# Patient Record
Sex: Female | Born: 1965 | Race: Black or African American | Hispanic: No | Smoking: Current some day smoker
Health system: Southern US, Community
[De-identification: ages and names within clinical notes are randomized; demographics above are authoritative.]

## PROBLEM LIST (undated history)

## (undated) DIAGNOSIS — I10 Essential (primary) hypertension: Secondary | ICD-10-CM

## (undated) DIAGNOSIS — Z9289 Personal history of other medical treatment: Secondary | ICD-10-CM

## (undated) DIAGNOSIS — D649 Anemia, unspecified: Secondary | ICD-10-CM

## (undated) DIAGNOSIS — K219 Gastro-esophageal reflux disease without esophagitis: Secondary | ICD-10-CM

## (undated) DIAGNOSIS — E785 Hyperlipidemia, unspecified: Secondary | ICD-10-CM

## (undated) DIAGNOSIS — E079 Disorder of thyroid, unspecified: Secondary | ICD-10-CM

## (undated) DIAGNOSIS — T7840XA Allergy, unspecified, initial encounter: Secondary | ICD-10-CM

## (undated) DIAGNOSIS — C50919 Malignant neoplasm of unspecified site of unspecified female breast: Secondary | ICD-10-CM

## (undated) HISTORY — PX: TUBAL LIGATION: SHX77

## (undated) HISTORY — PX: WISDOM TOOTH EXTRACTION: SHX21

## (undated) HISTORY — DX: Disorder of thyroid, unspecified: E07.9

## (undated) HISTORY — DX: Gastro-esophageal reflux disease without esophagitis: K21.9

## (undated) HISTORY — DX: Malignant neoplasm of unspecified site of unspecified female breast: C50.919

## (undated) HISTORY — PX: OTHER SURGICAL HISTORY: SHX169

## (undated) HISTORY — DX: Allergy, unspecified, initial encounter: T78.40XA

## (undated) HISTORY — DX: Hyperlipidemia, unspecified: E78.5

## (undated) HISTORY — DX: Essential (primary) hypertension: I10

## (undated) HISTORY — DX: Anemia, unspecified: D64.9

---

## 1998-06-21 ENCOUNTER — Emergency Department (HOSPITAL_COMMUNITY): Admission: EM | Admit: 1998-06-21 | Discharge: 1998-06-21 | Payer: Self-pay | Admitting: Emergency Medicine

## 2001-03-31 ENCOUNTER — Other Ambulatory Visit: Admission: RE | Admit: 2001-03-31 | Discharge: 2001-03-31 | Payer: Self-pay | Admitting: Family Medicine

## 2004-02-28 ENCOUNTER — Emergency Department (HOSPITAL_COMMUNITY): Admission: EM | Admit: 2004-02-28 | Discharge: 2004-02-28 | Payer: Self-pay | Admitting: Emergency Medicine

## 2004-04-15 DIAGNOSIS — I10 Essential (primary) hypertension: Secondary | ICD-10-CM | POA: Insufficient documentation

## 2004-08-07 ENCOUNTER — Ambulatory Visit: Payer: Self-pay | Admitting: Internal Medicine

## 2004-08-07 ENCOUNTER — Ambulatory Visit: Payer: Self-pay | Admitting: *Deleted

## 2004-08-28 ENCOUNTER — Ambulatory Visit: Payer: Self-pay | Admitting: Internal Medicine

## 2005-01-16 ENCOUNTER — Ambulatory Visit: Payer: Self-pay | Admitting: Family Medicine

## 2005-02-19 ENCOUNTER — Ambulatory Visit: Payer: Self-pay | Admitting: Family Medicine

## 2005-02-19 DIAGNOSIS — E785 Hyperlipidemia, unspecified: Secondary | ICD-10-CM

## 2005-04-03 ENCOUNTER — Ambulatory Visit: Payer: Self-pay | Admitting: Family Medicine

## 2005-05-01 ENCOUNTER — Ambulatory Visit: Payer: Self-pay | Admitting: Family Medicine

## 2005-06-27 ENCOUNTER — Ambulatory Visit: Payer: Self-pay | Admitting: Family Medicine

## 2005-06-27 DIAGNOSIS — D509 Iron deficiency anemia, unspecified: Secondary | ICD-10-CM | POA: Insufficient documentation

## 2005-09-04 ENCOUNTER — Ambulatory Visit: Payer: Self-pay | Admitting: Family Medicine

## 2005-09-22 ENCOUNTER — Ambulatory Visit (HOSPITAL_COMMUNITY): Admission: RE | Admit: 2005-09-22 | Discharge: 2005-09-22 | Payer: Self-pay | Admitting: Family Medicine

## 2005-12-19 ENCOUNTER — Ambulatory Visit: Payer: Self-pay | Admitting: Internal Medicine

## 2006-02-16 ENCOUNTER — Ambulatory Visit: Payer: Self-pay | Admitting: Family Medicine

## 2006-04-21 ENCOUNTER — Ambulatory Visit: Payer: Self-pay | Admitting: Family Medicine

## 2006-04-23 ENCOUNTER — Ambulatory Visit: Payer: Self-pay | Admitting: Family Medicine

## 2006-06-17 ENCOUNTER — Ambulatory Visit: Payer: Self-pay | Admitting: Family Medicine

## 2006-07-13 ENCOUNTER — Ambulatory Visit: Payer: Self-pay | Admitting: Family Medicine

## 2006-12-01 ENCOUNTER — Encounter (INDEPENDENT_AMBULATORY_CARE_PROVIDER_SITE_OTHER): Payer: Self-pay | Admitting: Specialist

## 2006-12-01 ENCOUNTER — Ambulatory Visit: Payer: Self-pay | Admitting: Family Medicine

## 2006-12-28 ENCOUNTER — Emergency Department (HOSPITAL_COMMUNITY): Admission: EM | Admit: 2006-12-28 | Discharge: 2006-12-29 | Payer: Self-pay | Admitting: Emergency Medicine

## 2006-12-29 ENCOUNTER — Ambulatory Visit: Payer: Self-pay | Admitting: Family Medicine

## 2007-01-26 ENCOUNTER — Ambulatory Visit: Payer: Self-pay | Admitting: Family Medicine

## 2007-06-14 ENCOUNTER — Ambulatory Visit: Payer: Self-pay | Admitting: Family Medicine

## 2007-06-17 DIAGNOSIS — J309 Allergic rhinitis, unspecified: Secondary | ICD-10-CM | POA: Insufficient documentation

## 2007-07-21 ENCOUNTER — Encounter (INDEPENDENT_AMBULATORY_CARE_PROVIDER_SITE_OTHER): Payer: Self-pay | Admitting: *Deleted

## 2007-08-16 ENCOUNTER — Encounter (INDEPENDENT_AMBULATORY_CARE_PROVIDER_SITE_OTHER): Payer: Self-pay | Admitting: Family Medicine

## 2007-08-19 ENCOUNTER — Telehealth (INDEPENDENT_AMBULATORY_CARE_PROVIDER_SITE_OTHER): Payer: Self-pay | Admitting: Internal Medicine

## 2007-11-04 DIAGNOSIS — Z9289 Personal history of other medical treatment: Secondary | ICD-10-CM

## 2007-11-04 HISTORY — DX: Personal history of other medical treatment: Z92.89

## 2007-12-02 ENCOUNTER — Ambulatory Visit: Payer: Self-pay | Admitting: Nurse Practitioner

## 2007-12-02 DIAGNOSIS — K5900 Constipation, unspecified: Secondary | ICD-10-CM | POA: Insufficient documentation

## 2007-12-02 DIAGNOSIS — S46819A Strain of other muscles, fascia and tendons at shoulder and upper arm level, unspecified arm, initial encounter: Secondary | ICD-10-CM

## 2008-01-05 ENCOUNTER — Ambulatory Visit: Payer: Self-pay | Admitting: Nurse Practitioner

## 2008-01-21 ENCOUNTER — Ambulatory Visit: Payer: Self-pay | Admitting: Nurse Practitioner

## 2008-01-21 LAB — CONVERTED CEMR LAB
Ketones, urine, test strip: NEGATIVE
Nitrite: NEGATIVE
Specific Gravity, Urine: 1.015
WBC Urine, dipstick: NEGATIVE

## 2008-01-25 LAB — CONVERTED CEMR LAB
ALT: 15 units/L (ref 0–35)
AST: 20 units/L (ref 0–37)
BUN: 11 mg/dL (ref 6–23)
Basophils Absolute: 0 10*3/uL (ref 0.0–0.1)
Basophils Relative: 0 % (ref 0–1)
CO2: 19 meq/L (ref 19–32)
Calcium: 10 mg/dL (ref 8.4–10.5)
Cholesterol: 184 mg/dL (ref 0–200)
Creatinine, Ser: 0.51 mg/dL (ref 0.40–1.20)
Eosinophils Relative: 4 % (ref 0–5)
HCT: 32.7 % — ABNORMAL LOW (ref 36.0–46.0)
HDL: 45 mg/dL (ref >39)
Hemoglobin: 9.3 g/dL — ABNORMAL LOW (ref 12.0–15.0)
MCHC: 28.4 g/dL — ABNORMAL LOW (ref 30.0–36.0)
Monocytes Absolute: 0.5 10*3/uL (ref 0.1–1.0)
Monocytes Relative: 11 % (ref 3–12)
RBC: 4.59 M/uL (ref 3.87–5.11)
RDW: 20.5 % — ABNORMAL HIGH (ref 11.5–15.5)
Total Bilirubin: 0.6 mg/dL (ref 0.3–1.2)
Total CHOL/HDL Ratio: 4.1
VLDL: 23 mg/dL (ref 0–40)

## 2008-01-28 ENCOUNTER — Encounter (INDEPENDENT_AMBULATORY_CARE_PROVIDER_SITE_OTHER): Payer: Self-pay | Admitting: Nurse Practitioner

## 2008-02-14 ENCOUNTER — Ambulatory Visit (HOSPITAL_COMMUNITY): Admission: RE | Admit: 2008-02-14 | Discharge: 2008-02-14 | Payer: Self-pay | Admitting: Internal Medicine

## 2008-02-18 ENCOUNTER — Telehealth (INDEPENDENT_AMBULATORY_CARE_PROVIDER_SITE_OTHER): Payer: Self-pay | Admitting: Nurse Practitioner

## 2008-02-18 ENCOUNTER — Encounter: Admission: RE | Admit: 2008-02-18 | Discharge: 2008-02-18 | Payer: Self-pay | Admitting: Internal Medicine

## 2008-04-10 ENCOUNTER — Ambulatory Visit: Payer: Self-pay | Admitting: Nurse Practitioner

## 2008-04-10 DIAGNOSIS — L84 Corns and callosities: Secondary | ICD-10-CM | POA: Insufficient documentation

## 2008-04-10 LAB — CONVERTED CEMR LAB
Bilirubin Urine: NEGATIVE
Glucose, Urine, Semiquant: NEGATIVE
Protein, U semiquant: 30
Urobilinogen, UA: 1
pH: 7

## 2008-04-11 ENCOUNTER — Encounter (INDEPENDENT_AMBULATORY_CARE_PROVIDER_SITE_OTHER): Payer: Self-pay | Admitting: Nurse Practitioner

## 2008-07-26 ENCOUNTER — Ambulatory Visit: Payer: Self-pay | Admitting: Nurse Practitioner

## 2008-07-26 DIAGNOSIS — J45909 Unspecified asthma, uncomplicated: Secondary | ICD-10-CM | POA: Insufficient documentation

## 2008-07-26 DIAGNOSIS — F17200 Nicotine dependence, unspecified, uncomplicated: Secondary | ICD-10-CM

## 2008-07-26 LAB — CONVERTED CEMR LAB
Bilirubin Urine: NEGATIVE
Glucose, Urine, Semiquant: NEGATIVE
KOH Prep: NEGATIVE
Ketones, urine, test strip: NEGATIVE
Nitrite: NEGATIVE
Protein, U semiquant: NEGATIVE
Specific Gravity, Urine: 1.025
Urobilinogen, UA: 1
WBC Urine, dipstick: NEGATIVE
pH: 6.5

## 2008-07-27 ENCOUNTER — Encounter (INDEPENDENT_AMBULATORY_CARE_PROVIDER_SITE_OTHER): Payer: Self-pay | Admitting: Nurse Practitioner

## 2008-08-08 ENCOUNTER — Telehealth (INDEPENDENT_AMBULATORY_CARE_PROVIDER_SITE_OTHER): Payer: Self-pay | Admitting: Nurse Practitioner

## 2008-08-21 ENCOUNTER — Emergency Department (HOSPITAL_COMMUNITY): Admission: EM | Admit: 2008-08-21 | Discharge: 2008-08-22 | Payer: Self-pay | Admitting: Emergency Medicine

## 2008-12-18 ENCOUNTER — Ambulatory Visit: Payer: Self-pay | Admitting: Nurse Practitioner

## 2008-12-18 DIAGNOSIS — K219 Gastro-esophageal reflux disease without esophagitis: Secondary | ICD-10-CM | POA: Insufficient documentation

## 2009-02-19 ENCOUNTER — Ambulatory Visit: Payer: Self-pay | Admitting: Internal Medicine

## 2009-04-13 ENCOUNTER — Encounter (INDEPENDENT_AMBULATORY_CARE_PROVIDER_SITE_OTHER): Payer: Self-pay | Admitting: Nurse Practitioner

## 2009-04-13 ENCOUNTER — Telehealth (INDEPENDENT_AMBULATORY_CARE_PROVIDER_SITE_OTHER): Payer: Self-pay | Admitting: Nurse Practitioner

## 2009-04-18 ENCOUNTER — Telehealth (INDEPENDENT_AMBULATORY_CARE_PROVIDER_SITE_OTHER): Payer: Self-pay | Admitting: *Deleted

## 2009-04-19 ENCOUNTER — Emergency Department (HOSPITAL_COMMUNITY): Admission: EM | Admit: 2009-04-19 | Discharge: 2009-04-19 | Payer: Self-pay | Admitting: Emergency Medicine

## 2009-04-23 ENCOUNTER — Ambulatory Visit: Payer: Self-pay | Admitting: Nurse Practitioner

## 2009-04-23 DIAGNOSIS — M25469 Effusion, unspecified knee: Secondary | ICD-10-CM | POA: Insufficient documentation

## 2009-04-24 ENCOUNTER — Encounter (INDEPENDENT_AMBULATORY_CARE_PROVIDER_SITE_OTHER): Payer: Self-pay | Admitting: Nurse Practitioner

## 2009-04-24 LAB — CONVERTED CEMR LAB
ALT: 25 units/L (ref 0–35)
Albumin: 4.3 g/dL (ref 3.5–5.2)
Alkaline Phosphatase: 137 units/L — ABNORMAL HIGH (ref 39–117)
Basophils Absolute: 0 10*3/uL (ref 0.0–0.1)
CO2: 21 meq/L (ref 19–32)
Eosinophils Relative: 4 % (ref 0–5)
HCT: 24.9 % — ABNORMAL LOW (ref 36.0–46.0)
Hemoglobin: 6.8 g/dL — CL (ref 12.0–15.0)
Lymphocytes Relative: 30 % (ref 12–46)
Lymphs Abs: 1.9 10*3/uL (ref 0.7–4.0)
Monocytes Absolute: 0.5 10*3/uL (ref 0.1–1.0)
Monocytes Relative: 8 % (ref 3–12)
Neutro Abs: 3.7 10*3/uL (ref 1.7–7.7)
Potassium: 4.3 meq/L (ref 3.5–5.3)
RBC: 4.23 M/uL (ref 3.87–5.11)
RDW: 20.6 % — ABNORMAL HIGH (ref 11.5–15.5)
Sed Rate: 36 mm/hr — ABNORMAL HIGH (ref 0–22)
Sodium: 137 meq/L (ref 135–145)
Total Bilirubin: 0.3 mg/dL (ref 0.3–1.2)
Total Protein: 7.6 g/dL (ref 6.0–8.3)
Uric Acid, Serum: 4 mg/dL (ref 2.4–7.0)
WBC: 6.3 10*3/uL (ref 4.0–10.5)

## 2009-04-25 ENCOUNTER — Encounter (INDEPENDENT_AMBULATORY_CARE_PROVIDER_SITE_OTHER): Payer: Self-pay | Admitting: Nurse Practitioner

## 2009-04-25 LAB — CONVERTED CEMR LAB
HCT: 26 % — ABNORMAL LOW (ref 36.0–46.0)
Hemoglobin: 7 g/dL — CL (ref 12.0–15.0)
LDL Cholesterol: 94 mg/dL (ref 0–99)
MCHC: 26.7 g/dL — ABNORMAL LOW (ref 30.0–36.0)
Platelets: 434 10*3/uL — ABNORMAL HIGH (ref 150–400)
RDW: 20.7 % — ABNORMAL HIGH (ref 11.5–15.5)
VLDL: 27 mg/dL (ref 0–40)

## 2009-05-23 ENCOUNTER — Ambulatory Visit: Payer: Self-pay | Admitting: Family Medicine

## 2009-05-25 ENCOUNTER — Encounter: Admission: RE | Admit: 2009-05-25 | Discharge: 2009-05-25 | Payer: Self-pay | Admitting: Internal Medicine

## 2009-05-28 ENCOUNTER — Telehealth (INDEPENDENT_AMBULATORY_CARE_PROVIDER_SITE_OTHER): Payer: Self-pay | Admitting: Nurse Practitioner

## 2009-06-04 ENCOUNTER — Encounter (INDEPENDENT_AMBULATORY_CARE_PROVIDER_SITE_OTHER): Payer: Self-pay | Admitting: Diagnostic Radiology

## 2009-06-04 ENCOUNTER — Encounter: Admission: RE | Admit: 2009-06-04 | Discharge: 2009-06-04 | Payer: Self-pay | Admitting: Internal Medicine

## 2009-06-08 ENCOUNTER — Ambulatory Visit: Payer: Self-pay | Admitting: Oncology

## 2009-06-11 ENCOUNTER — Encounter: Admission: RE | Admit: 2009-06-11 | Discharge: 2009-06-11 | Payer: Self-pay | Admitting: Internal Medicine

## 2009-06-13 LAB — CBC WITH DIFFERENTIAL/PLATELET
Basophils Absolute: 0.1 10*3/uL (ref 0.0–0.1)
Eosinophils Absolute: 0.3 10*3/uL (ref 0.0–0.5)
HGB: 7.9 g/dL — ABNORMAL LOW (ref 11.6–15.9)
LYMPH%: 37.4 % (ref 14.0–49.7)
MCV: 67.6 fL — ABNORMAL LOW (ref 79.5–101.0)
MONO#: 0.7 10*3/uL (ref 0.1–0.9)
MONO%: 13.4 % (ref 0.0–14.0)
NEUT#: 2.2 10*3/uL (ref 1.5–6.5)
Platelets: 406 10*3/uL — ABNORMAL HIGH (ref 145–400)
RBC: 3.98 10*6/uL (ref 3.70–5.45)
RDW: 23.8 % — ABNORMAL HIGH (ref 11.2–14.5)
WBC: 5.2 10*3/uL (ref 3.9–10.3)

## 2009-06-13 LAB — FERRITIN: Ferritin: 2 ng/mL — ABNORMAL LOW (ref 10–291)

## 2009-06-14 LAB — COMPREHENSIVE METABOLIC PANEL
ALT: 13 U/L (ref 0–35)
CO2: 20 mEq/L (ref 19–32)
Calcium: 10.5 mg/dL (ref 8.4–10.5)
Chloride: 106 mEq/L (ref 96–112)
Potassium: 4.1 mEq/L (ref 3.5–5.3)
Sodium: 135 mEq/L (ref 135–145)
Total Protein: 7.3 g/dL (ref 6.0–8.3)

## 2009-06-14 LAB — LACTATE DEHYDROGENASE: LDH: 182 U/L (ref 94–250)

## 2009-06-20 ENCOUNTER — Ambulatory Visit (HOSPITAL_BASED_OUTPATIENT_CLINIC_OR_DEPARTMENT_OTHER): Admission: RE | Admit: 2009-06-20 | Discharge: 2009-06-20 | Payer: Self-pay | Admitting: General Surgery

## 2009-06-22 ENCOUNTER — Encounter (INDEPENDENT_AMBULATORY_CARE_PROVIDER_SITE_OTHER): Payer: Self-pay | Admitting: Nurse Practitioner

## 2009-06-26 ENCOUNTER — Encounter: Payer: Self-pay | Admitting: Oncology

## 2009-06-26 ENCOUNTER — Ambulatory Visit: Admission: RE | Admit: 2009-06-26 | Discharge: 2009-06-26 | Payer: Self-pay | Admitting: Oncology

## 2009-06-27 ENCOUNTER — Ambulatory Visit (HOSPITAL_COMMUNITY): Admission: RE | Admit: 2009-06-27 | Discharge: 2009-06-27 | Payer: Self-pay | Admitting: Oncology

## 2009-06-29 ENCOUNTER — Encounter (INDEPENDENT_AMBULATORY_CARE_PROVIDER_SITE_OTHER): Payer: Self-pay | Admitting: Nurse Practitioner

## 2009-06-29 LAB — RETICULOCYTES: RBC: 4.01 10*6/uL (ref 3.70–5.45)

## 2009-06-29 LAB — CBC WITH DIFFERENTIAL/PLATELET
BASO%: 0.2 % (ref 0.0–2.0)
LYMPH%: 11 % — ABNORMAL LOW (ref 14.0–49.7)
MCHC: 29.8 g/dL — ABNORMAL LOW (ref 31.5–36.0)
MONO#: 1.2 10*3/uL — ABNORMAL HIGH (ref 0.1–0.9)
Platelets: 235 10*3/uL (ref 145–400)
RBC: 4.05 10*6/uL (ref 3.70–5.45)
RDW: 29 % — ABNORMAL HIGH (ref 11.2–14.5)
WBC: 12.4 10*3/uL — ABNORMAL HIGH (ref 3.9–10.3)
nRBC: 1 % — ABNORMAL HIGH (ref 0–0)

## 2009-06-29 LAB — FERRITIN: Ferritin: 730 ng/mL — ABNORMAL HIGH (ref 10–291)

## 2009-07-03 ENCOUNTER — Ambulatory Visit: Payer: Self-pay | Admitting: Oncology

## 2009-07-03 LAB — CBC WITH DIFFERENTIAL/PLATELET
Eosinophils Absolute: 0.1 10*3/uL (ref 0.0–0.5)
HCT: 28.7 % — ABNORMAL LOW (ref 34.8–46.6)
LYMPH%: 11.5 % — ABNORMAL LOW (ref 14.0–49.7)
MCHC: 30.3 g/dL — ABNORMAL LOW (ref 31.5–36.0)
MCV: 73.4 fL — ABNORMAL LOW (ref 79.5–101.0)
MONO#: 0.2 10*3/uL (ref 0.1–0.9)
NEUT#: 9.1 10*3/uL — ABNORMAL HIGH (ref 1.5–6.5)
NEUT%: 84.4 % — ABNORMAL HIGH (ref 38.4–76.8)
Platelets: 236 10*3/uL (ref 145–400)
WBC: 10.8 10*3/uL — ABNORMAL HIGH (ref 3.9–10.3)

## 2009-07-06 ENCOUNTER — Encounter (INDEPENDENT_AMBULATORY_CARE_PROVIDER_SITE_OTHER): Payer: Self-pay | Admitting: Nurse Practitioner

## 2009-07-06 DIAGNOSIS — C50919 Malignant neoplasm of unspecified site of unspecified female breast: Secondary | ICD-10-CM | POA: Insufficient documentation

## 2009-07-06 LAB — CBC WITH DIFFERENTIAL/PLATELET
BASO%: 1.1 % (ref 0.0–2.0)
Basophils Absolute: 0.1 10*3/uL (ref 0.0–0.1)
EOS%: 0.4 % (ref 0.0–7.0)
HCT: 31.2 % — ABNORMAL LOW (ref 34.8–46.6)
HGB: 9.5 g/dL — ABNORMAL LOW (ref 11.6–15.9)
LYMPH%: 24.2 % (ref 14.0–49.7)
MCH: 22.5 pg — ABNORMAL LOW (ref 25.1–34.0)
MCHC: 30.4 g/dL — ABNORMAL LOW (ref 31.5–36.0)
MONO#: 1.7 10*3/uL — ABNORMAL HIGH (ref 0.1–0.9)
NEUT%: 56.1 % (ref 38.4–76.8)
Platelets: 435 10*3/uL — ABNORMAL HIGH (ref 145–400)

## 2009-07-13 ENCOUNTER — Encounter: Payer: Self-pay | Admitting: Endocrinology

## 2009-07-13 ENCOUNTER — Encounter (INDEPENDENT_AMBULATORY_CARE_PROVIDER_SITE_OTHER): Payer: Self-pay | Admitting: Nurse Practitioner

## 2009-07-13 LAB — CBC WITH DIFFERENTIAL/PLATELET
Basophils Absolute: 0.1 10*3/uL (ref 0.0–0.1)
EOS%: 0.4 % (ref 0.0–7.0)
Eosinophils Absolute: 0 10*3/uL (ref 0.0–0.5)
HGB: 9.3 g/dL — ABNORMAL LOW (ref 11.6–15.9)
LYMPH%: 23.8 % (ref 14.0–49.7)
MCH: 23.9 pg — ABNORMAL LOW (ref 25.1–34.0)
MCV: 78.4 fL — ABNORMAL LOW (ref 79.5–101.0)
MONO%: 11.4 % (ref 0.0–14.0)
NEUT%: 63 % (ref 38.4–76.8)
Platelets: 221 10*3/uL (ref 145–400)
RDW: 31.2 % — ABNORMAL HIGH (ref 11.2–14.5)

## 2009-07-20 ENCOUNTER — Telehealth (INDEPENDENT_AMBULATORY_CARE_PROVIDER_SITE_OTHER): Payer: Self-pay | Admitting: Nurse Practitioner

## 2009-07-20 ENCOUNTER — Encounter (INDEPENDENT_AMBULATORY_CARE_PROVIDER_SITE_OTHER): Payer: Self-pay | Admitting: Nurse Practitioner

## 2009-07-20 LAB — CBC WITH DIFFERENTIAL/PLATELET
BASO%: 0.6 % (ref 0.0–2.0)
EOS%: 0.3 % (ref 0.0–7.0)
HGB: 10.3 g/dL — ABNORMAL LOW (ref 11.6–15.9)
MCH: 25.4 pg (ref 25.1–34.0)
MCHC: 31.7 g/dL (ref 31.5–36.0)
MCV: 80.2 fL (ref 79.5–101.0)
MONO#: 1.1 10*3/uL — ABNORMAL HIGH (ref 0.1–0.9)
NEUT%: 74.2 % (ref 38.4–76.8)
RBC: 4.05 10*6/uL (ref 3.70–5.45)
lymph#: 1.1 10*3/uL (ref 0.9–3.3)
nRBC: 1 % — ABNORMAL HIGH (ref 0–0)

## 2009-07-20 LAB — COMPREHENSIVE METABOLIC PANEL
AST: 28 U/L (ref 0–37)
Albumin: 4.2 g/dL (ref 3.5–5.2)
BUN: 12 mg/dL (ref 6–23)
Calcium: 10.2 mg/dL (ref 8.4–10.5)
Chloride: 105 mEq/L (ref 96–112)
Potassium: 3.4 mEq/L — ABNORMAL LOW (ref 3.5–5.3)
Sodium: 137 mEq/L (ref 135–145)
Total Protein: 7.4 g/dL (ref 6.0–8.3)

## 2009-07-23 ENCOUNTER — Encounter: Payer: Self-pay | Admitting: Endocrinology

## 2009-07-27 ENCOUNTER — Encounter (INDEPENDENT_AMBULATORY_CARE_PROVIDER_SITE_OTHER): Payer: Self-pay | Admitting: Nurse Practitioner

## 2009-07-27 LAB — CBC WITH DIFFERENTIAL/PLATELET
Basophils Absolute: 0 10*3/uL (ref 0.0–0.1)
Eosinophils Absolute: 0 10*3/uL (ref 0.0–0.5)
HGB: 10.6 g/dL — ABNORMAL LOW (ref 11.6–15.9)
MONO#: 0.1 10*3/uL (ref 0.1–0.9)
MONO%: 1.9 % (ref 0.0–14.0)
NEUT#: 4.6 10*3/uL (ref 1.5–6.5)
RBC: 3.96 10*6/uL (ref 3.70–5.45)
RDW: 34.8 % — ABNORMAL HIGH (ref 11.2–14.5)
WBC: 5.9 10*3/uL (ref 3.9–10.3)
lymph#: 1.1 10*3/uL (ref 0.9–3.3)

## 2009-08-01 ENCOUNTER — Ambulatory Visit: Payer: Self-pay | Admitting: Oncology

## 2009-08-02 ENCOUNTER — Ambulatory Visit: Payer: Self-pay | Admitting: Endocrinology

## 2009-08-02 DIAGNOSIS — E049 Nontoxic goiter, unspecified: Secondary | ICD-10-CM

## 2009-08-02 DIAGNOSIS — E0590 Thyrotoxicosis, unspecified without thyrotoxic crisis or storm: Secondary | ICD-10-CM | POA: Insufficient documentation

## 2009-08-03 ENCOUNTER — Telehealth (INDEPENDENT_AMBULATORY_CARE_PROVIDER_SITE_OTHER): Payer: Self-pay | Admitting: Nurse Practitioner

## 2009-08-03 ENCOUNTER — Encounter (INDEPENDENT_AMBULATORY_CARE_PROVIDER_SITE_OTHER): Payer: Self-pay | Admitting: Nurse Practitioner

## 2009-08-03 LAB — CBC WITH DIFFERENTIAL/PLATELET
BASO%: 1.4 % (ref 0.0–2.0)
EOS%: 0.6 % (ref 0.0–7.0)
HCT: 31.6 % — ABNORMAL LOW (ref 34.8–46.6)
HGB: 9.8 g/dL — ABNORMAL LOW (ref 11.6–15.9)
LYMPH%: 22.2 % (ref 14.0–49.7)
MCH: 26.3 pg (ref 25.1–34.0)
MCV: 84.9 fL (ref 79.5–101.0)
MONO%: 11.6 % (ref 0.0–14.0)
NEUT%: 64.2 % (ref 38.4–76.8)
Platelets: 151 10*3/uL (ref 145–400)

## 2009-08-10 ENCOUNTER — Encounter (HOSPITAL_COMMUNITY): Admission: RE | Admit: 2009-08-10 | Discharge: 2009-11-02 | Payer: Self-pay | Admitting: Oncology

## 2009-08-10 LAB — CBC WITH DIFFERENTIAL/PLATELET
Basophils Absolute: 0 10*3/uL (ref 0.0–0.1)
EOS%: 0 % (ref 0.0–7.0)
HGB: 8.4 g/dL — ABNORMAL LOW (ref 11.6–15.9)
MCH: 26.7 pg (ref 25.1–34.0)
MCHC: 31.3 g/dL — ABNORMAL LOW (ref 31.5–36.0)
MCV: 85.1 fL (ref 79.5–101.0)
MONO%: 6 % (ref 0.0–14.0)
RDW: 28.8 % — ABNORMAL HIGH (ref 11.2–14.5)

## 2009-08-10 LAB — COMPREHENSIVE METABOLIC PANEL
AST: 27 U/L (ref 0–37)
Albumin: 4.1 g/dL (ref 3.5–5.2)
Alkaline Phosphatase: 90 U/L (ref 39–117)
BUN: 10 mg/dL (ref 6–23)
Creatinine, Ser: 0.48 mg/dL (ref 0.40–1.20)
Potassium: 3.7 mEq/L (ref 3.5–5.3)

## 2009-08-10 LAB — TYPE & CROSSMATCH - CHCC

## 2009-08-13 ENCOUNTER — Ambulatory Visit (HOSPITAL_COMMUNITY): Admission: RE | Admit: 2009-08-13 | Discharge: 2009-08-13 | Payer: Self-pay | Admitting: Oncology

## 2009-08-16 LAB — CBC WITH DIFFERENTIAL/PLATELET
EOS%: 0.8 % (ref 0.0–7.0)
Eosinophils Absolute: 0 10*3/uL (ref 0.0–0.5)
HCT: 31.6 % — ABNORMAL LOW (ref 34.8–46.6)
HGB: 10.6 g/dL — ABNORMAL LOW (ref 11.6–15.9)
MCH: 29.1 pg (ref 25.1–34.0)
MCHC: 33.6 g/dL (ref 31.5–36.0)
MONO#: 0.1 10*3/uL (ref 0.1–0.9)
MONO%: 2.2 % (ref 0.0–14.0)
Platelets: 302 10*3/uL (ref 145–400)
RDW: 27.5 % — ABNORMAL HIGH (ref 11.2–14.5)
WBC: 6 10*3/uL (ref 3.9–10.3)

## 2009-08-20 ENCOUNTER — Ambulatory Visit (HOSPITAL_COMMUNITY): Admission: RE | Admit: 2009-08-20 | Discharge: 2009-08-20 | Payer: Self-pay | Admitting: Oncology

## 2009-08-20 ENCOUNTER — Encounter (INDEPENDENT_AMBULATORY_CARE_PROVIDER_SITE_OTHER): Payer: Self-pay | Admitting: Nurse Practitioner

## 2009-08-21 ENCOUNTER — Encounter (HOSPITAL_COMMUNITY): Admission: RE | Admit: 2009-08-21 | Discharge: 2009-11-02 | Payer: Self-pay | Admitting: Endocrinology

## 2009-08-22 ENCOUNTER — Encounter (INDEPENDENT_AMBULATORY_CARE_PROVIDER_SITE_OTHER): Payer: Self-pay | Admitting: Nurse Practitioner

## 2009-08-22 LAB — CBC WITH DIFFERENTIAL/PLATELET
Eosinophils Absolute: 0 10*3/uL (ref 0.0–0.5)
MONO#: 0.4 10*3/uL (ref 0.1–0.9)
NEUT#: 7.9 10*3/uL — ABNORMAL HIGH (ref 1.5–6.5)
Platelets: 241 10*3/uL (ref 145–400)
RBC: 3.86 10*6/uL (ref 3.70–5.45)
RDW: 27.3 % — ABNORMAL HIGH (ref 11.2–14.5)
WBC: 9.4 10*3/uL (ref 3.9–10.3)
lymph#: 1 10*3/uL (ref 0.9–3.3)

## 2009-08-31 ENCOUNTER — Encounter (INDEPENDENT_AMBULATORY_CARE_PROVIDER_SITE_OTHER): Payer: Self-pay | Admitting: Nurse Practitioner

## 2009-08-31 ENCOUNTER — Ambulatory Visit: Payer: Self-pay | Admitting: Oncology

## 2009-08-31 LAB — CBC WITH DIFFERENTIAL/PLATELET
Eosinophils Absolute: 0 10*3/uL (ref 0.0–0.5)
HCT: 35.3 % (ref 34.8–46.6)
HGB: 11.5 g/dL — ABNORMAL LOW (ref 11.6–15.9)
LYMPH%: 6.8 % — ABNORMAL LOW (ref 14.0–49.7)
MONO#: 0.2 10*3/uL (ref 0.1–0.9)
NEUT#: 10.2 10*3/uL — ABNORMAL HIGH (ref 1.5–6.5)
NEUT%: 91.1 % — ABNORMAL HIGH (ref 38.4–76.8)
Platelets: 397 10*3/uL (ref 145–400)
WBC: 11.2 10*3/uL — ABNORMAL HIGH (ref 3.9–10.3)

## 2009-09-03 ENCOUNTER — Ambulatory Visit: Payer: Self-pay | Admitting: Nurse Practitioner

## 2009-09-04 ENCOUNTER — Encounter: Admission: RE | Admit: 2009-09-04 | Discharge: 2009-09-04 | Payer: Self-pay | Admitting: Endocrinology

## 2009-09-04 ENCOUNTER — Encounter: Payer: Self-pay | Admitting: Endocrinology

## 2009-09-04 ENCOUNTER — Encounter (INDEPENDENT_AMBULATORY_CARE_PROVIDER_SITE_OTHER): Payer: Self-pay | Admitting: Diagnostic Radiology

## 2009-09-04 ENCOUNTER — Other Ambulatory Visit: Admission: RE | Admit: 2009-09-04 | Discharge: 2009-09-04 | Payer: Self-pay | Admitting: Diagnostic Radiology

## 2009-09-06 ENCOUNTER — Encounter: Payer: Self-pay | Admitting: Endocrinology

## 2009-09-07 ENCOUNTER — Encounter (INDEPENDENT_AMBULATORY_CARE_PROVIDER_SITE_OTHER): Payer: Self-pay | Admitting: Nurse Practitioner

## 2009-09-07 LAB — CBC WITH DIFFERENTIAL/PLATELET
BASO%: 0.3 % (ref 0.0–2.0)
Basophils Absolute: 0 10*3/uL (ref 0.0–0.1)
Eosinophils Absolute: 0 10*3/uL (ref 0.0–0.5)
HCT: 33.8 % — ABNORMAL LOW (ref 34.8–46.6)
HGB: 11.3 g/dL — ABNORMAL LOW (ref 11.6–15.9)
LYMPH%: 16.8 % (ref 14.0–49.7)
MCHC: 33.3 g/dL (ref 31.5–36.0)
MONO#: 0.3 10*3/uL (ref 0.1–0.9)
NEUT#: 5.4 10*3/uL (ref 1.5–6.5)
NEUT%: 77.7 % — ABNORMAL HIGH (ref 38.4–76.8)
Platelets: 268 10*3/uL (ref 145–400)
WBC: 7 10*3/uL (ref 3.9–10.3)
lymph#: 1.2 10*3/uL (ref 0.9–3.3)

## 2009-09-07 LAB — COMPREHENSIVE METABOLIC PANEL
ALT: 69 U/L — ABNORMAL HIGH (ref 0–35)
CO2: 26 mEq/L (ref 19–32)
Calcium: 9.9 mg/dL (ref 8.4–10.5)
Chloride: 103 mEq/L (ref 96–112)
Creatinine, Ser: 0.52 mg/dL (ref 0.40–1.20)
Glucose, Bld: 100 mg/dL — ABNORMAL HIGH (ref 70–99)

## 2009-09-11 ENCOUNTER — Encounter (INDEPENDENT_AMBULATORY_CARE_PROVIDER_SITE_OTHER): Payer: Self-pay | Admitting: Nurse Practitioner

## 2009-09-12 ENCOUNTER — Ambulatory Visit: Payer: Self-pay | Admitting: Nurse Practitioner

## 2009-09-21 ENCOUNTER — Encounter (INDEPENDENT_AMBULATORY_CARE_PROVIDER_SITE_OTHER): Payer: Self-pay | Admitting: Nurse Practitioner

## 2009-09-21 LAB — CBC WITH DIFFERENTIAL/PLATELET
BASO%: 0.1 % (ref 0.0–2.0)
Eosinophils Absolute: 0 10*3/uL (ref 0.0–0.5)
HCT: 36.1 % (ref 34.8–46.6)
MCHC: 32.6 g/dL (ref 31.5–36.0)
MONO#: 0.8 10*3/uL (ref 0.1–0.9)
NEUT#: 9.9 10*3/uL — ABNORMAL HIGH (ref 1.5–6.5)
NEUT%: 86.4 % — ABNORMAL HIGH (ref 38.4–76.8)
RBC: 3.9 10*6/uL (ref 3.70–5.45)
WBC: 11.5 10*3/uL — ABNORMAL HIGH (ref 3.9–10.3)
lymph#: 0.8 10*3/uL — ABNORMAL LOW (ref 0.9–3.3)

## 2009-09-21 LAB — URINALYSIS, MICROSCOPIC - CHCC
Glucose: NEGATIVE g/dL
Ketones: NEGATIVE mg/dL
Specific Gravity, Urine: 1.015 (ref 1.003–1.035)

## 2009-09-26 LAB — CBC WITH DIFFERENTIAL/PLATELET
Eosinophils Absolute: 0.1 10*3/uL (ref 0.0–0.5)
HCT: 37.7 % (ref 34.8–46.6)
LYMPH%: 21.8 % (ref 14.0–49.7)
MCV: 90.8 fL (ref 79.5–101.0)
MONO#: 0.1 10*3/uL (ref 0.1–0.9)
MONO%: 4 % (ref 0.0–14.0)
NEUT#: 2.3 10*3/uL (ref 1.5–6.5)
NEUT%: 69.2 % (ref 38.4–76.8)
Platelets: 197 10*3/uL (ref 145–400)
WBC: 3.3 10*3/uL — ABNORMAL LOW (ref 3.9–10.3)

## 2009-10-02 ENCOUNTER — Ambulatory Visit: Payer: Self-pay | Admitting: Oncology

## 2009-10-03 HISTORY — PX: BREAST SURGERY: SHX581

## 2009-10-05 LAB — COMPREHENSIVE METABOLIC PANEL
ALT: 28 U/L (ref 0–35)
CO2: 27 mEq/L (ref 19–32)
Calcium: 9.7 mg/dL (ref 8.4–10.5)
Chloride: 107 mEq/L (ref 96–112)
Creatinine, Ser: 0.62 mg/dL (ref 0.40–1.20)
Glucose, Bld: 100 mg/dL — ABNORMAL HIGH (ref 70–99)
Sodium: 140 mEq/L (ref 135–145)
Total Bilirubin: 0.6 mg/dL (ref 0.3–1.2)
Total Protein: 6.9 g/dL (ref 6.0–8.3)

## 2009-10-05 LAB — CBC WITH DIFFERENTIAL/PLATELET
Basophils Absolute: 0.1 10*3/uL (ref 0.0–0.1)
Eosinophils Absolute: 0 10*3/uL (ref 0.0–0.5)
HGB: 12.3 g/dL (ref 11.6–15.9)
LYMPH%: 20.6 % (ref 14.0–49.7)
MCV: 93.4 fL (ref 79.5–101.0)
MONO%: 10.2 % (ref 0.0–14.0)
NEUT#: 3.3 10*3/uL (ref 1.5–6.5)
NEUT%: 67.6 % (ref 38.4–76.8)
Platelets: 195 10*3/uL (ref 145–400)

## 2009-10-12 ENCOUNTER — Encounter (INDEPENDENT_AMBULATORY_CARE_PROVIDER_SITE_OTHER): Payer: Self-pay | Admitting: Nurse Practitioner

## 2009-10-12 LAB — CBC WITH DIFFERENTIAL/PLATELET
BASO%: 0.3 % (ref 0.0–2.0)
Eosinophils Absolute: 0 10*3/uL (ref 0.0–0.5)
MCHC: 33.4 g/dL (ref 31.5–36.0)
MONO#: 1.3 10*3/uL — ABNORMAL HIGH (ref 0.1–0.9)
NEUT#: 6.9 10*3/uL — ABNORMAL HIGH (ref 1.5–6.5)
RBC: 3.98 10*6/uL (ref 3.70–5.45)
RDW: 17 % — ABNORMAL HIGH (ref 11.2–14.5)
WBC: 9.3 10*3/uL (ref 3.9–10.3)
lymph#: 1.1 10*3/uL (ref 0.9–3.3)

## 2009-10-17 ENCOUNTER — Ambulatory Visit (HOSPITAL_COMMUNITY): Admission: RE | Admit: 2009-10-17 | Discharge: 2009-10-17 | Payer: Self-pay | Admitting: Oncology

## 2009-11-21 ENCOUNTER — Other Ambulatory Visit: Admission: RE | Admit: 2009-11-21 | Discharge: 2009-11-21 | Payer: Self-pay | Admitting: Obstetrics and Gynecology

## 2009-11-21 ENCOUNTER — Ambulatory Visit: Payer: Self-pay | Admitting: Obstetrics and Gynecology

## 2009-11-26 ENCOUNTER — Encounter: Payer: Self-pay | Admitting: Endocrinology

## 2009-11-27 ENCOUNTER — Ambulatory Visit (HOSPITAL_COMMUNITY): Admission: RE | Admit: 2009-11-27 | Discharge: 2009-11-28 | Payer: Self-pay | Admitting: General Surgery

## 2009-11-27 ENCOUNTER — Encounter: Admission: RE | Admit: 2009-11-27 | Discharge: 2009-11-27 | Payer: Self-pay | Admitting: General Surgery

## 2009-11-27 ENCOUNTER — Encounter (INDEPENDENT_AMBULATORY_CARE_PROVIDER_SITE_OTHER): Payer: Self-pay | Admitting: General Surgery

## 2009-11-30 ENCOUNTER — Telehealth: Payer: Self-pay | Admitting: Endocrinology

## 2009-12-05 ENCOUNTER — Ambulatory Visit: Payer: Self-pay | Admitting: Endocrinology

## 2009-12-10 ENCOUNTER — Ambulatory Visit: Admission: RE | Admit: 2009-12-10 | Discharge: 2010-03-06 | Payer: Self-pay | Admitting: Radiation Oncology

## 2009-12-11 ENCOUNTER — Encounter (INDEPENDENT_AMBULATORY_CARE_PROVIDER_SITE_OTHER): Payer: Self-pay | Admitting: Nurse Practitioner

## 2009-12-12 ENCOUNTER — Encounter: Payer: Self-pay | Admitting: Endocrinology

## 2009-12-19 ENCOUNTER — Ambulatory Visit: Payer: Self-pay | Admitting: Obstetrics and Gynecology

## 2009-12-19 LAB — CONVERTED CEMR LAB: LH: 42.6 milliintl units/mL

## 2009-12-31 ENCOUNTER — Encounter: Admission: RE | Admit: 2009-12-31 | Discharge: 2009-12-31 | Payer: Self-pay | Admitting: Radiation Oncology

## 2010-01-21 ENCOUNTER — Ambulatory Visit: Payer: Self-pay | Admitting: Oncology

## 2010-01-23 LAB — CBC WITH DIFFERENTIAL/PLATELET
BASO%: 0.3 % (ref 0.0–2.0)
EOS%: 6 % (ref 0.0–7.0)
Eosinophils Absolute: 0.2 10*3/uL (ref 0.0–0.5)
LYMPH%: 32.7 % (ref 14.0–49.7)
MCHC: 34 g/dL (ref 31.5–36.0)
MCV: 87 fL (ref 79.5–101.0)
MONO%: 8.9 % (ref 0.0–14.0)
NEUT#: 1.8 10*3/uL (ref 1.5–6.5)
Platelets: 238 10*3/uL (ref 145–400)
RBC: 4.6 10*6/uL (ref 3.70–5.45)
RDW: 13.7 % (ref 11.2–14.5)

## 2010-01-23 LAB — COMPREHENSIVE METABOLIC PANEL
ALT: 28 U/L (ref 0–35)
AST: 20 U/L (ref 0–37)
Albumin: 4.2 g/dL (ref 3.5–5.2)
Alkaline Phosphatase: 117 U/L (ref 39–117)
Glucose, Bld: 94 mg/dL (ref 70–99)
Potassium: 3.8 mEq/L (ref 3.5–5.3)
Sodium: 137 mEq/L (ref 135–145)
Total Bilirubin: 0.8 mg/dL (ref 0.3–1.2)
Total Protein: 6.6 g/dL (ref 6.0–8.3)

## 2010-02-07 ENCOUNTER — Ambulatory Visit: Payer: Self-pay | Admitting: Obstetrics and Gynecology

## 2010-02-12 ENCOUNTER — Encounter (INDEPENDENT_AMBULATORY_CARE_PROVIDER_SITE_OTHER): Payer: Self-pay | Admitting: Nurse Practitioner

## 2010-02-18 LAB — COMPREHENSIVE METABOLIC PANEL
Albumin: 4.3 g/dL (ref 3.5–5.2)
Alkaline Phosphatase: 119 U/L — ABNORMAL HIGH (ref 39–117)
BUN: 11 mg/dL (ref 6–23)
CO2: 28 mEq/L (ref 19–32)
Glucose, Bld: 94 mg/dL (ref 70–99)
Potassium: 3.4 mEq/L — ABNORMAL LOW (ref 3.5–5.3)
Sodium: 137 mEq/L (ref 135–145)
Total Bilirubin: 1.1 mg/dL (ref 0.3–1.2)
Total Protein: 7.1 g/dL (ref 6.0–8.3)

## 2010-02-18 LAB — CBC WITH DIFFERENTIAL/PLATELET
Basophils Absolute: 0 10*3/uL (ref 0.0–0.1)
EOS%: 7.6 % — ABNORMAL HIGH (ref 0.0–7.0)
Eosinophils Absolute: 0.3 10*3/uL (ref 0.0–0.5)
HCT: 40.6 % (ref 34.8–46.6)
HGB: 13.9 g/dL (ref 11.6–15.9)
MCH: 29.6 pg (ref 25.1–34.0)
MCV: 86.4 fL (ref 79.5–101.0)
MONO%: 11.2 % (ref 0.0–14.0)
NEUT#: 1.9 10*3/uL (ref 1.5–6.5)
NEUT%: 53.9 % (ref 38.4–76.8)

## 2010-02-18 LAB — CANCER ANTIGEN 27.29: CA 27.29: 16 U/mL (ref 0–39)

## 2010-02-18 LAB — FOLLICLE STIMULATING HORMONE: FSH: 68.6 m[IU]/mL

## 2010-02-22 ENCOUNTER — Ambulatory Visit: Payer: Self-pay | Admitting: Oncology

## 2010-02-25 ENCOUNTER — Encounter (INDEPENDENT_AMBULATORY_CARE_PROVIDER_SITE_OTHER): Payer: Self-pay | Admitting: Nurse Practitioner

## 2010-02-27 LAB — ESTRADIOL, ULTRA SENS: Estradiol, Ultra Sensitive: 18 pg/mL

## 2010-03-04 ENCOUNTER — Ambulatory Visit: Payer: Self-pay | Admitting: Oncology

## 2010-03-06 ENCOUNTER — Inpatient Hospital Stay (HOSPITAL_COMMUNITY): Admission: AD | Admit: 2010-03-06 | Discharge: 2010-03-08 | Payer: Self-pay | Admitting: Oncology

## 2010-03-06 ENCOUNTER — Encounter (INDEPENDENT_AMBULATORY_CARE_PROVIDER_SITE_OTHER): Payer: Self-pay | Admitting: Nurse Practitioner

## 2010-03-13 ENCOUNTER — Telehealth (INDEPENDENT_AMBULATORY_CARE_PROVIDER_SITE_OTHER): Payer: Self-pay | Admitting: Nurse Practitioner

## 2010-03-14 ENCOUNTER — Encounter (INDEPENDENT_AMBULATORY_CARE_PROVIDER_SITE_OTHER): Payer: Self-pay | Admitting: Nurse Practitioner

## 2010-03-21 ENCOUNTER — Encounter (INDEPENDENT_AMBULATORY_CARE_PROVIDER_SITE_OTHER): Payer: Self-pay | Admitting: Nurse Practitioner

## 2010-03-25 ENCOUNTER — Ambulatory Visit: Payer: Self-pay | Admitting: Oncology

## 2010-03-26 LAB — COMPREHENSIVE METABOLIC PANEL
CO2: 24 mEq/L (ref 19–32)
Calcium: 10.1 mg/dL (ref 8.4–10.5)
Creatinine, Ser: 0.57 mg/dL (ref 0.40–1.20)
Glucose, Bld: 85 mg/dL (ref 70–99)
Total Bilirubin: 0.8 mg/dL (ref 0.3–1.2)
Total Protein: 7.2 g/dL (ref 6.0–8.3)

## 2010-03-26 LAB — CBC WITH DIFFERENTIAL/PLATELET
Eosinophils Absolute: 0.3 10*3/uL (ref 0.0–0.5)
HCT: 43.7 % (ref 34.8–46.6)
HGB: 14.9 g/dL (ref 11.6–15.9)
LYMPH%: 29.2 % (ref 14.0–49.7)
MONO#: 0.4 10*3/uL (ref 0.1–0.9)
NEUT#: 1.7 10*3/uL (ref 1.5–6.5)
NEUT%: 49 % (ref 38.4–76.8)
Platelets: 235 10*3/uL (ref 145–400)
WBC: 3.4 10*3/uL — ABNORMAL LOW (ref 3.9–10.3)

## 2010-03-28 ENCOUNTER — Encounter (INDEPENDENT_AMBULATORY_CARE_PROVIDER_SITE_OTHER): Payer: Self-pay | Admitting: Nurse Practitioner

## 2010-04-16 ENCOUNTER — Ambulatory Visit (HOSPITAL_BASED_OUTPATIENT_CLINIC_OR_DEPARTMENT_OTHER): Admission: RE | Admit: 2010-04-16 | Discharge: 2010-04-16 | Payer: Self-pay | Admitting: General Surgery

## 2010-04-24 ENCOUNTER — Ambulatory Visit: Payer: Self-pay | Admitting: Oncology

## 2010-04-24 ENCOUNTER — Encounter (INDEPENDENT_AMBULATORY_CARE_PROVIDER_SITE_OTHER): Payer: Self-pay | Admitting: Nurse Practitioner

## 2010-04-24 LAB — URINALYSIS, MICROSCOPIC - CHCC
Nitrite: NEGATIVE
Protein: 100 mg/dL
Specific Gravity, Urine: 1.01 (ref 1.003–1.035)

## 2010-05-10 ENCOUNTER — Other Ambulatory Visit: Admission: RE | Admit: 2010-05-10 | Discharge: 2010-05-10 | Payer: Self-pay | Admitting: Obstetrics and Gynecology

## 2010-05-10 ENCOUNTER — Ambulatory Visit: Payer: Self-pay | Admitting: Obstetrics and Gynecology

## 2010-05-15 ENCOUNTER — Ambulatory Visit: Payer: Self-pay | Admitting: Endocrinology

## 2010-05-31 ENCOUNTER — Encounter: Admission: RE | Admit: 2010-05-31 | Discharge: 2010-05-31 | Payer: Self-pay | Admitting: Internal Medicine

## 2010-07-29 ENCOUNTER — Ambulatory Visit: Payer: Self-pay | Admitting: Nurse Practitioner

## 2010-07-29 LAB — CONVERTED CEMR LAB
Cholesterol, target level: 200 mg/dL
HDL goal, serum: 40 mg/dL
LDL Goal: 130 mg/dL

## 2010-08-20 ENCOUNTER — Ambulatory Visit: Payer: Self-pay | Admitting: Oncology

## 2010-08-22 ENCOUNTER — Encounter: Payer: Self-pay | Admitting: Internal Medicine

## 2010-08-22 ENCOUNTER — Encounter (INDEPENDENT_AMBULATORY_CARE_PROVIDER_SITE_OTHER): Payer: Self-pay | Admitting: Nurse Practitioner

## 2010-08-22 LAB — COMPREHENSIVE METABOLIC PANEL
ALT: 16 U/L (ref 0–35)
AST: 15 U/L (ref 0–37)
Alkaline Phosphatase: 72 U/L (ref 39–117)
Glucose, Bld: 91 mg/dL (ref 70–99)
Sodium: 138 mEq/L (ref 135–145)
Total Bilirubin: 0.7 mg/dL (ref 0.3–1.2)
Total Protein: 7 g/dL (ref 6.0–8.3)

## 2010-08-22 LAB — CBC WITH DIFFERENTIAL/PLATELET
BASO%: 0.4 % (ref 0.0–2.0)
EOS%: 8.4 % — ABNORMAL HIGH (ref 0.0–7.0)
LYMPH%: 32.4 % (ref 14.0–49.7)
MCH: 29.6 pg (ref 25.1–34.0)
MCHC: 33.5 g/dL (ref 31.5–36.0)
MCV: 88.4 fL (ref 79.5–101.0)
MONO%: 11.7 % (ref 0.0–14.0)
Platelets: 273 10*3/uL (ref 145–400)
RBC: 4.58 10*6/uL (ref 3.70–5.45)

## 2010-08-22 LAB — LIPID PANEL
LDL Cholesterol: 142 mg/dL — ABNORMAL HIGH (ref 0–99)
Triglycerides: 155 mg/dL — ABNORMAL HIGH (ref <150)
VLDL: 31 mg/dL (ref 0–40)

## 2010-11-24 ENCOUNTER — Encounter: Payer: Self-pay | Admitting: Internal Medicine

## 2010-11-24 ENCOUNTER — Encounter: Payer: Self-pay | Admitting: Endocrinology

## 2010-11-24 ENCOUNTER — Encounter: Payer: Self-pay | Admitting: Oncology

## 2010-11-24 ENCOUNTER — Encounter: Payer: Self-pay | Admitting: Radiation Oncology

## 2010-12-03 ENCOUNTER — Encounter (INDEPENDENT_AMBULATORY_CARE_PROVIDER_SITE_OTHER): Payer: Self-pay | Admitting: Nurse Practitioner

## 2010-12-03 ENCOUNTER — Encounter: Payer: Self-pay | Admitting: Nurse Practitioner

## 2010-12-03 DIAGNOSIS — N898 Other specified noninflammatory disorders of vagina: Secondary | ICD-10-CM | POA: Insufficient documentation

## 2010-12-03 DIAGNOSIS — R3129 Other microscopic hematuria: Secondary | ICD-10-CM | POA: Insufficient documentation

## 2010-12-03 LAB — CONVERTED CEMR LAB
Bilirubin Urine: NEGATIVE
KOH Prep: NEGATIVE
Urobilinogen, UA: 0.2

## 2010-12-03 NOTE — Letter (Signed)
Summary: [facility]  [facility]   Imported By: Sherian Rein 09/11/2010 10:12:21  _____________________________________________________________________  External Attachment:    Type:   Image     Comment:   External Document

## 2010-12-03 NOTE — Miscellaneous (Signed)
Summary: Med addition  [facility] - full H&P to be scanned   Medications Added DIOVAN 80 MG TABS (VALSARTAN) One tablet by mouth daily       Clinical Lists Changes  Medications: Added new medication of DIOVAN 80 MG TABS (VALSARTAN) One tablet by mouth daily

## 2010-12-03 NOTE — Progress Notes (Signed)
Summary: Med change  Medications Added DIOVAN 320 MG TABS (VALSARTAN) One tablet by mouth daily CLONIDINE HCL 0.2 MG TABS (CLONIDINE HCL) One tablet by mouth two times a day TAMOXIFEN CITRATE 20 MG TABS (TAMOXIFEN CITRATE) One tablet by mouth daily KLOR-CON M20 20 MEQ CR-TABS (POTASSIUM CHLORIDE CRYS CR) One tablet by mouth daily            New/Updated Medications: DIOVAN 320 MG TABS (VALSARTAN) One tablet by mouth daily CLONIDINE HCL 0.2 MG TABS (CLONIDINE HCL) One tablet by mouth two times a day TAMOXIFEN CITRATE 20 MG TABS (TAMOXIFEN CITRATE) One tablet by mouth daily KLOR-CON M20 20 MEQ CR-TABS (POTASSIUM CHLORIDE CRYS CR) One tablet by mouth daily

## 2010-12-03 NOTE — Letter (Signed)
Summary: HEMATOLOGY//OFFICE PROGRESS NOTE  HEMATOLOGY//OFFICE PROGRESS NOTE   Imported By: Arta Bruce 12/31/2009 12:56:01  _____________________________________________________________________  External Attachment:    Type:   Image     Comment:   External Document

## 2010-12-03 NOTE — Letter (Signed)
[facility]   Imported By: Arta Bruce 11/19/2009 16:47:11  _____________________________________________________________________  External Attachment:    Type:   Image     Comment:   External Document

## 2010-12-03 NOTE — Letter (Signed)
[facility]  [facility]   Imported By: Arta Bruce 04/19/2010 15:27:08  _____________________________________________________________________  External Attachment:    Type:   Image     Comment:   External Document

## 2010-12-03 NOTE — Letter (Signed)
[facility]   Imported By: Arta Bruce 04/16/2010 14:39:15  _____________________________________________________________________  External Attachment:    Type:   Image     Comment:   External Document

## 2010-12-03 NOTE — Letter (Signed)
Summary: [facility]   Imported By: Sherian Rein 12/03/2009 13:17:25  _____________________________________________________________________  External Attachment:    Type:   Image     Comment:   External Document

## 2010-12-03 NOTE — Letter (Signed)
Summary: Discharge Summary  Discharge Summary   Imported By: Arta Bruce 05/08/2010 15:29:06  _____________________________________________________________________  External Attachment:    Type:   Image     Comment:   External Document

## 2010-12-03 NOTE — Letter (Signed)
[facility]  [facility]   Imported By: Arta Bruce 05/22/2010 14:57:22  _____________________________________________________________________  External Attachment:    Type:   Image     Comment:   External Document

## 2010-12-03 NOTE — Letter (Signed)
[facility]   Imported By: Arta Bruce 04/16/2010 14:34:07  _____________________________________________________________________  External Attachment:    Type:   Image     Comment:   External Document

## 2010-12-03 NOTE — Letter (Signed)
Summary: [facility]   Imported By: Sherian Rein 12/25/2009 10:30:56  _____________________________________________________________________  External Attachment:    Type:   Image     Comment:   External Document

## 2010-12-03 NOTE — Letter (Signed)
[facility]  [facility]ES   Imported By: Arta Bruce 09/23/2010 14:29:49  _____________________________________________________________________  External Attachment:    Type:   Image     Comment:   External Document

## 2010-12-03 NOTE — Progress Notes (Signed)
Summary: Appt needed  Phone Note Outgoing Call   Summary of Call: MD had rec'd some papers from [facility] and wants to see patient next week. Spoke with patient and made appt. Initial call taken by: Lucious Groves,  November 30, 2009 8:58 AM

## 2010-12-03 NOTE — Letter (Signed)
[facility]   Imported By: Arta Bruce 02/18/2010 16:17:52  _____________________________________________________________________  External Attachment:    Type:   Image     Comment:   External Document

## 2010-12-03 NOTE — Assessment & Plan Note (Signed)
Summary: FU ON THYROID/[facility] #   Vital Signs:  Patient profile:   45 year old female Menstrual status:  irregular Height:      62.5 inches (158.75 cm) Weight:      159 pounds (72.27 kg) O2 Sat:      98 % on Room air Temp:     98.4 degrees F (36.89 degrees C) oral Pulse rate:   86 / minute BP sitting:   142 / 102  (left arm) Cuff size:   regular  Vitals Entered By: Josph Macho CMA (December 05, 2009 8:56 AM)  O2 Flow:  Room air CC: Follow-up visit on thyroid/ pt states she is no longer using Proventil or taking Ferrous sulfate/ CF   Referring Provider:  magrinat  CC:  Follow-up visit on thyroid/ pt states she is no longer using Proventil or taking Ferrous sulfate/ CF.  History of Present Illness: pt has hyperthyroidism due to multinodular goiter.  one nodule was "cold" on the scan, so she needed a bx (it showed follicualr lesion).  she was referred to surgery, but the thyroid surgery had to be delayed until the matter of breast nodule was addressed.  pt states she feels well in general.  Current Medications (verified): 1)  Lisinopril-Hydrochlorothiazide 20-25 Mg  Tabs (Lisinopril-Hydrochlorothiazide) .Marland Kitchen.. 1 By Mouth Daily For Blood Pressure 2)  Toprol Xl 100 Mg Xr24h-Tab (Metoprolol Succinate) .... One Tablet By Mouth Daily For Blood Pressure 3)  Proventil Hfa 108 (90 Base) Mcg/act Aers (Albuterol Sulfate) .... 2 Puffs Every 6 Hours As Needed For Shortness of Breath 4)  Crestor 10 Mg Tabs (Rosuvastatin Calcium) .Marland Kitchen.. 1 Tablet By Mouth Nightly For Cholesterol 5)  Ferrous Sulfate 325 (65 Fe) Mg Tbec (Ferrous Sulfate) .Marland Kitchen.. 1 Tablet By Mouth Three Times A Day 6)  Reprexain 10-200 Mg Tabs (Hydrocodone-Ibuprofen) .... One Every 12 Hours As Needed For Severe Pain 7)  Tsh .... 240.9 Please Forward Results 8)  Omeprazole 20 Mg Cpdr (Omeprazole) .Marland Kitchen.. 1 Capsule Two Times A Day 9)  Lorazepam 0.5 Mg Tabs (Lorazepam) .Marland Kitchen.. 1-2 Tab As Needed  Allergies (verified): No Known Drug  Allergies  Past History:  Past Medical History: Last updated: 06/17/2007 Hypertension (04/15/2004) Hyperlipidemia (02/19/2005) Anemia-iron deficiency (06/27/2005) Allergic rhinitis  Review of Systems  The patient denies weight loss and weight gain.    Physical Exam  General:  normal appearance.   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 1 capsule two times a day 2)  Lorazepam 0.5 Mg Tabs (Lorazepam) .Marland Kitchen.. 1-2 tab as needed  Other Orders: TLB-TSH (Thyroid Stimulating Hormone) (84443-TSH) TLB-T4 (Thyrox), Free [phone]) Est. Patient Level III (40981)  Patient Instructions: 1)  tests are being ordered for you today.  a few days after the test(s), please call [phone] to hear your test results. 2)  you should have the thyroid lump removed.  then we'll recheck your blood tests to see if they are better then.

## 2010-12-03 NOTE — Assessment & Plan Note (Signed)
Summary: HTN   Vital Signs:  Patient profile:   45 year old female Menstrual status:  irregular Weight:      157.3 pounds BMI:     28.41 Pulse rate:   88 / minute Pulse rhythm:   regular Resp:     20 per minute BP sitting:   150 / 102  (left arm) Cuff size:   regular  Vitals Entered By: Levon Hedger (July 29, 2010 4:48 PM)  Nutrition Counseling: Patient's BMI is greater than 25 and therefore counseled on weight management options. CC: renew medications, Hypertension Management, Lipid Management Is Patient Diabetic? No Pain Assessment Patient in pain? no       Does patient need assistance? Functional Status Self care Ambulation Normal   CC:  renew medications, Hypertension Management, and Lipid Management.  History of Present Illness:  Pt into the office for f/u on htn last visit here 1 year She has been maintaining her f/u with the cancer center for breast cancer. pt no longer has medicaid and has concerns about the cost of her medications she has not been able to afford all of her medications for the past month due to cost  Hypertension History:      She denies headache, chest pain, and palpitations.  She notes no problems with any antihypertensive medication side effects.  Pt presents today with several blood pressure medication bottles most of which are empty. She needs refills.        Positive major cardiovascular risk factors include hyperlipidemia, hypertension, and current tobacco user.  Negative major cardiovascular risk factors include female age less than 56 years old, no history of diabetes, and negative family history for ischemic heart disease.        Further assessment for target organ damage reveals no history of cardiac end-organ damage (CHF/LVH), stroke/TIA, peripheral vascular disease, renal insufficiency, or hypertensive retinopathy.    Lipid Management History:      Positive NCEP/ATP III risk factors include HDL cholesterol less than 40,  current tobacco user, and hypertension.  Negative NCEP/ATP III risk factors include female age less than 25 years old, no history of early menopause without estrogen hormone replacement, non-diabetic, no family history for ischemic heart disease, no prior stroke/TIA, no peripheral vascular disease, and no history of aortic aneurysm.        Comments include: pt was previously on meds but stopped taking them during her course of treatment for cancer.      Habits & Providers  Alcohol-Tobacco-Diet     Alcohol drinks/day: >5     Alcohol type: beer     >5/day in last 3 mos: yes     Tobacco Status: current     Tobacco Counseling: to remain off tobacco products     Cigarette Packs/Day: 0.75  Exercise-Depression-Behavior     Drug Use: no     Seat Belt Use: 100     Sun Exposure: occasionally  Current Medications (verified): 1)  Lisinopril-Hydrochlorothiazide 20-25 Mg  Tabs (Lisinopril-Hydrochlorothiazide) .Marland Kitchen.. 1 By Mouth Daily For Blood Pressure 2)  Toprol Xl 100 Mg Xr24h-Tab (Metoprolol Succinate) .... One Tablet By Mouth Daily For Blood Pressure 3)  Proventil Hfa 108 (90 Base) Mcg/act Aers (Albuterol Sulfate) .... 2 Puffs Every 6 Hours As Needed For Shortness of Breath 4)  Diovan 160 Mg Tabs (Valsartan) .... One Tablet By Mouth Daily For Blood Pressure 5)  Clonidine Hcl 0.2 Mg Tabs (Clonidine Hcl) .... One Tablet By Mouth Nightly For Blood Pressure 6)  Tamoxifen  Citrate 20 Mg Tabs (Tamoxifen Citrate) .... One Tablet By Mouth Daily  Allergies (verified): No Known Drug Allergies  Review of Systems CV:  Denies chest pain or discomfort. Resp:  Denies cough. GI:  Denies abdominal pain, nausea, and vomiting.  Physical Exam  General:  alert.   Head:  normocephalic.   Lungs:  normal breath sounds.   Heart:  normal rate and regular rhythm.   Abdomen:  normal bowel sounds.   Msk:  normal ROM.   Neurologic:  alert & oriented X3.     Impression & Recommendations:  Problem # 1:   HYPERTENSION (ICD-401.9) pt advised to restart her meds as ordered DASH diet reviewed Her updated medication list for this problem includes:    Lisinopril-hydrochlorothiazide 20-25 Mg Tabs (Lisinopril-hydrochlorothiazide) .Marland Kitchen... 1 by mouth daily for blood pressure    Toprol Xl 100 Mg Xr24h-tab (Metoprolol succinate) ..... One tablet by mouth daily for blood pressure    Diovan 160 Mg Tabs (Valsartan) ..... One tablet by mouth daily for blood pressure    Clonidine Hcl 0.2 Mg Tabs (Clonidine hcl) ..... One tablet by mouth nightly for blood pressure  Problem # 2:  HYPERLIPIDEMIA (ICD-272.4) will need to check fasting labs to determine if pt still needs meds she will return for labs The following medications were removed from the medication list:    Crestor 10 Mg Tabs (Rosuvastatin calcium) .Marland Kitchen... 1 tablet by mouth nightly for cholesterol  Problem # 3:  HYPERTHYROIDISM (ICD-242.90) pt opted not to have treatment she did have an endocrinology consult Her updated medication list for this problem includes:    Toprol Xl 100 Mg Xr24h-tab (Metoprolol succinate) ..... One tablet by mouth daily for blood pressure  Problem # 4:  TOBACCO ABUSE (ICD-305.1) advised cessation  Complete Medication List: 1)  Lisinopril-hydrochlorothiazide 20-25 Mg Tabs (Lisinopril-hydrochlorothiazide) .Marland Kitchen.. 1 by mouth daily for blood pressure 2)  Toprol Xl 100 Mg Xr24h-tab (Metoprolol succinate) .... One tablet by mouth daily for blood pressure 3)  Proventil Hfa 108 (90 Base) Mcg/act Aers (Albuterol sulfate) .... 2 puffs every 6 hours as needed for shortness of breath 4)  Diovan 160 Mg Tabs (Valsartan) .... One tablet by mouth daily for blood pressure 5)  Clonidine Hcl 0.2 Mg Tabs (Clonidine hcl) .... One tablet by mouth nightly for blood pressure 6)  Tamoxifen Citrate 20 Mg Tabs (Tamoxifen citrate) .... One tablet by mouth daily  Hypertension Assessment/Plan:      The patient's hypertensive risk group is category B: At  least one risk factor (excluding diabetes) with no target organ damage.  Her calculated 10 year risk of coronary heart disease is 6 %.  Today's blood pressure is 150/102.  Her blood pressure goal is < 140/90.  Lipid Assessment/Plan:      Based on NCEP/ATP III, the patient's risk factor category is "2 or more risk factors and a calculated 10 year CAD risk of < 20%".  The patient's lipid goals are as follows: Total cholesterol goal is 200; LDL cholesterol goal is 130; HDL cholesterol goal is 40; Triglyceride goal is 150.    Patient Instructions: 1)  Your blood pressure medications will be sent to the pharmacy. 2)  Be sure to take daily 3)  Follow up in this office in 2-3 weeks for fasting labs and flu vaccine. 4)  Do not eat after midnight before this visit.   5)  You will need lipids, cmet, cbc, tsh. 6)  Keep your appointments with oncology as ordered. Prescriptions: CLONIDINE  HCL 0.2 MG TABS (CLONIDINE HCL) One tablet by mouth nightly for blood pressure  #30 x 5   Entered and Authorized by:   Lehman Prom FNP   Signed by:   Lehman Prom FNP on 07/29/2010   Method used:   Faxed to ...       [facility] (retail)       [address] [place], [place]  [address]       Ph: [phone]       Fax: [phone]   RxID:   [id number] DIOVAN 160 MG TABS (VALSARTAN) One tablet by mouth daily for blood pressure  #30 x 5   Entered and Authorized by:   Lehman Prom FNP   Signed by:   Lehman Prom FNP on 07/29/2010   Method used:   Faxed to ...       [facility] (retail)       [address] [place], [place]  [address]       Ph: [phone]       Fax: [phone]   RxID:   [id number] TOPROL XL 100 MG XR24H-TAB (METOPROLOL SUCCINATE) One tablet by mouth daily for blood pressure  #30 x 5   Entered and Authorized by:   Lehman Prom FNP   Signed by:   Lehman Prom FNP on  07/29/2010   Method used:   Faxed to ...       [facility] (retail)       [address] [place], [place]  [address]       Ph: [phone]       Fax: [phone]   RxID:   [id number] LISINOPRIL-HYDROCHLOROTHIAZIDE 20-25 MG  TABS (LISINOPRIL-HYDROCHLOROTHIAZIDE) 1 by mouth daily for blood pressure  #30 x 5   Entered and Authorized by:   Lehman Prom FNP   Signed by:   Lehman Prom FNP on 07/29/2010   Method used:   Faxed to ...       [facility] - [facility] (retail)       [address] [place], [place]  [address]       Ph: [phone]       Fax: [phone]   RxID:   [id number] DIOVAN 160 MG TABS (VALSARTAN) One tablet by mouth daily for blood pressure  #30 x 5   Entered and Authorized by:   Lehman Prom FNP   Signed by:   Lehman Prom FNP on 07/29/2010   Method used:   Print then Give to Patient   RxID:   [id number] CLONIDINE HCL 0.2 MG TABS (CLONIDINE HCL) One tablet by mouth nightly for blood pressure  #30 x 5   Entered and Authorized by:   Lehman Prom FNP   Signed by:   Lehman Prom FNP on 07/29/2010   Method used:   Print then Give to Patient   RxID:   [id number] TOPROL XL 100 MG XR24H-TAB (METOPROLOL SUCCINATE) One tablet by mouth daily for blood pressure  #30 x 5   Entered and Authorized by:   Lehman Prom FNP   Signed by:   Lehman Prom FNP on 07/29/2010   Method used:   Print then Give to Patient   RxID:   [id number] LISINOPRIL-HYDROCHLOROTHIAZIDE 20-25 MG  TABS (LISINOPRIL-HYDROCHLOROTHIAZIDE) 1 by mouth daily for blood  pressure  #30 x 5   Entered and Authorized by:   Lehman Prom FNP   Signed by:   Lehman Prom FNP on 07/29/2010   Method used:   Print then Give to Patient   RxID:   [id number]

## 2010-12-03 NOTE — Letter (Signed)
[facility]  [facility]   Imported By: Arta Bruce 05/02/2010 14:31:58  _____________________________________________________________________  External Attachment:    Type:   Image     Comment:   External Document

## 2010-12-03 NOTE — Letter (Signed)
[facility]  [facility]   Imported By: Arta Bruce 11/19/2009 16:49:46  _____________________________________________________________________  External Attachment:    Type:   Image     Comment:   External Document

## 2010-12-03 NOTE — Letter (Signed)
[facility]   Imported By: Arta Bruce 11/19/2009 16:48:30  _____________________________________________________________________  External Attachment:    Type:   Image     Comment:   External Document

## 2010-12-03 NOTE — Letter (Signed)
[facility]ESS [facility]  [facility]   Imported By: Arta Bruce 04/05/2010 10:17:35  _____________________________________________________________________  External Attachment:    Type:   Image     Comment:   External Document

## 2010-12-03 NOTE — Letter (Signed)
Summary: HEMATOLOGY/MEDICAL ONCOLOGY  HEMATOLOGY/MEDICAL ONCOLOGY   Imported By: Arta Bruce 12/31/2009 12:50:45  _____________________________________________________________________  External Attachment:    Type:   Image     Comment:   External Document

## 2010-12-05 ENCOUNTER — Encounter: Payer: Self-pay | Admitting: Nurse Practitioner

## 2010-12-05 DIAGNOSIS — T887XXA Unspecified adverse effect of drug or medicament, initial encounter: Secondary | ICD-10-CM | POA: Insufficient documentation

## 2010-12-09 ENCOUNTER — Telehealth (INDEPENDENT_AMBULATORY_CARE_PROVIDER_SITE_OTHER): Payer: Self-pay | Admitting: Nurse Practitioner

## 2010-12-11 NOTE — Letter (Signed)
Summary: Handout Printed  Printed Handout:  - Bacterial Vaginosis-Brief 

## 2010-12-11 NOTE — Letter (Signed)
Summary: Work Excuse  [facility]  [address] [place], [place] [address]   Phone: [phone]  Fax: [phone]    Today's Date: December 03, 2010  Name of Patient: Tammy Blanchard  The above named patient had a medical visit today   Please take this into consideration when reviewing the time away from work  Special Instructions:  [  ] None  [ X] To be off the remainder of today, returning to the normal work / school schedule tomorrow.  [  ] To be off until the next scheduled appointment on ______________________.  [  ] Other ________________________________________________________________ ________________________________________________________________________   Sincerely yours,   Lehman Prom FNP [facility]

## 2010-12-11 NOTE — Assessment & Plan Note (Signed)
Summary: Bacterial Vaginosis   Vital Signs:  Patient profile:   45 year old female Menstrual status:  irregular LMP:     08/2010 Weight:      153.7 pounds BMI:     27.76 Temp:     97.6 degrees F oral Pulse rate:   88 / minute Pulse rhythm:   regular Resp:     20 per minute BP sitting:   90 / 74  (left arm) Cuff size:   regular  Vitals Entered By: Levon Hedger (December 03, 2010 11:48 AM)  Nutrition Counseling: Patient's BMI is greater than 25 and therefore counseled on weight management options. CC: vaginal discharge yellowish in color with pain in vagina x 1 week and some abdominal pressure, Hypertension Management, Lipid Management, Vaginal discharge Is Patient Diabetic? No Pain Assessment Patient in pain? yes     Location: vagina  Does patient need assistance? Functional Status Self care Ambulation Normal LMP (date): 08/2010 LMP - Character: light     Enter LMP: 08/2010 Last PAP Result negative   CC:  vaginal discharge yellowish in color with pain in vagina x 1 week and some abdominal pressure, Hypertension Management, Lipid Management, and Vaginal discharge.  History of Present Illness:  Pt into the office for vaginal discharge  Vaginal discharge      This is a 46 year old woman who presents with Vaginal discharge.  The symptoms began 1 week ago.  The severity is described as mild.  The patient complains of pelvic pain, but denies itching, burning on urination, frequency, and fever.  The discharge is described as yellow.  Associated symptoms include unusual vaginal bleeding.    Hypertension History:      She denies headache, chest pain, and palpitations.  She notes no problems with any antihypertensive medication side effects.  Pt is taking meds as ordered.        Positive major cardiovascular risk factors include hyperlipidemia, hypertension, and current tobacco user.  Negative major cardiovascular risk factors include female age less than 69 years old, no  history of diabetes, and negative family history for ischemic heart disease.        Further assessment for target organ damage reveals no history of cardiac end-organ damage (CHF/LVH), stroke/TIA, peripheral vascular disease, renal insufficiency, or hypertensive retinopathy.    Lipid Management History:      Positive NCEP/ATP III risk factors include HDL cholesterol less than 40, current tobacco user, and hypertension.  Negative NCEP/ATP III risk factors include female age less than 24 years old, no history of early menopause without estrogen hormone replacement, non-diabetic, no family history for ischemic heart disease, no prior stroke/TIA, no peripheral vascular disease, and no history of aortic aneurysm.      Allergies (verified): No Known Drug Allergies  Review of Systems General:  Denies loss of appetite. CV:  Denies chest pain or discomfort. Resp:  Denies cough. GI:  Denies abdominal pain, nausea, and vomiting. GU:  Complains of discharge.  Physical Exam  General:  alert.   Head:  normocephalic.   Genitalia:  self wet prep Neurologic:  alert & oriented X3.   Psych:  Oriented X3.     Impression & Recommendations:  Problem # 1:  MICROSCOPIC HEMATURIA (ICD-599.72)  will send for culture Orders: UA Dipstick w/o Micro (manual) ([address]) T-Culture, Urine ([id number])  Her updated medication list for this problem includes:    Metronidazole 500 Mg Tabs (Metronidazole) ..... One tablet by mouth two times a day for infection  Problem # 2:  VAGINAL DISCHARGE (ICD-623.5) Bacterial vaginosis - handout given Orders: KOH/ WET Mount [phone])  Problem # 3:  NEED PROPHYLACTIC VACCINATION&INOCULATION FLU (ICD-V04.81) given today  Complete Medication List: 1)  Lisinopril-hydrochlorothiazide 20-25 Mg Tabs (Lisinopril-hydrochlorothiazide) .Marland Kitchen.. 1 by mouth daily for blood pressure 2)  Toprol Xl 100 Mg Xr24h-tab (Metoprolol succinate) .... One tablet by mouth daily for blood pressure 3)   Proventil Hfa 108 (90 Base) Mcg/act Aers (Albuterol sulfate) .... 2 puffs every 6 hours as needed for shortness of breath 4)  Diovan 160 Mg Tabs (Valsartan) .... One tablet by mouth daily for blood pressure 5)  Clonidine Hcl 0.2 Mg Tabs (Clonidine hcl) .... One tablet by mouth nightly for blood pressure 6)  Tamoxifen Citrate 20 Mg Tabs (Tamoxifen citrate) .... One tablet by mouth daily 7)  Metronidazole 500 Mg Tabs (Metronidazole) .... One tablet by mouth two times a day for infection  Hypertension Assessment/Plan:      The patient's hypertensive risk group is category B: At least one risk factor (excluding diabetes) with no target organ damage.  Her calculated 10 year risk of coronary heart disease is 2 %.  Today's blood pressure is 90/74.  Her blood pressure goal is < 140/90.  Lipid Assessment/Plan:      Based on NCEP/ATP III, the patient's risk factor category is "2 or more risk factors and a calculated 10 year CAD risk of < 20%".  The patient's lipid goals are as follows: Total cholesterol goal is 200; LDL cholesterol goal is 130; HDL cholesterol goal is 40; Triglyceride goal is 150.    Patient Instructions: 1)  Tamoxifen - Call and let the cancer center and let them know you can no longer afford this medication due to cost. 2)  Do they have any samples or do they have a a patient assistance program in which you can get the medications 3)  You have received the flu vaccine today 4)  Bacterial Vaginosis - read handout 5)  Take metronidazole 500mg  by mouth two times a day x 7 days 6)  Be sure to wipe from front to back, wear white cotton underwear Prescriptions: METRONIDAZOLE 500 MG TABS (METRONIDAZOLE) One tablet by mouth two times a day for infection  #14 x 0   Entered and Authorized by:   Lehman Prom FNP   Signed by:   Lehman Prom FNP on 12/03/2010   Method used:   Print then Give to Patient   RxID:   [id number]    Orders Added: 1)  Est. Patient Level III [08657] 2)   UA Dipstick w/o Micro (manual) [81002] 3)  KOH/ WET Mount [87210] 4)  T-Culture, Urine [[id number]]    Laboratory Results   Urine Tests  Date/Time Received: December 03, 2010 12:34 PM   Routine Urinalysis   Color: lt. yellow Appearance: Clear Glucose: negative   (Normal Range: Negative) Bilirubin: negative   (Normal Range: Negative) Ketone: negative   (Normal Range: Negative) Spec. Gravity: 1.010   (Normal Range: 1.003-1.035) Blood: trace-intact   (Normal Range: Negative) pH: 6.0   (Normal Range: 5.0-8.0) Protein: negative   (Normal Range: Negative) Urobilinogen: 0.2   (Normal Range: 0-1) Nitrite: negative   (Normal Range: Negative) Leukocyte Esterace: negative   (Normal Range: Negative)    Date/Time Received: December 03, 2010 12:53 PM   Wet Mount/KOH Source: vaginal WBC/hpf: 1-5 Bacteria/hpf: rare Clue cells/hpf: few Yeast/hpf: none Trichomonas/hpf: none   Prevention & Chronic Care Immunizations   Influenza vaccine: Fluvax 3+  (  09/12/2009)    Tetanus booster: 01/21/2008: Tdap    Pneumococcal vaccine: Not documented  Other Screening   Pap smear: negative  (01/21/2008)    Mammogram: BI-RADS CATEGORY 2:  Benign finding(s).^MM DIGITAL DIAGNOSTIC BILAT  (05/31/2010)   Smoking status: current  (07/29/2010)   Smoking cessation counseling: yes  (01/21/2008)  Lipids   Total Cholesterol: 156  (04/24/2009)   LDL: 94  (04/24/2009)   LDL Direct: Not documented   HDL: 35  (04/24/2009)   Triglycerides: 136  (04/24/2009)    SGOT (AST): 21  (04/23/2009)   SGPT (ALT): 25  (04/23/2009)   Alkaline phosphatase: 137  (04/23/2009)   Total bilirubin: 0.3  (04/23/2009)  Hypertension   Last Blood Pressure: 90 / 74  (12/03/2010)   Serum creatinine: 0.83  (04/23/2009)   Serum potassium 4.3  (04/23/2009)  Self-Management Support :    Hypertension self-management support: Not documented    Lipid self-management support: Not documented    Nursing Instructions: Give  Flu vaccine today    Laboratory Results   Urine Tests    Routine Urinalysis   Color: lt. yellow Appearance: Clear Glucose: negative   (Normal Range: Negative) Bilirubin: negative   (Normal Range: Negative) Ketone: negative   (Normal Range: Negative) Spec. Gravity: 1.010   (Normal Range: 1.003-1.035) Blood: trace-intact   (Normal Range: Negative) pH: 6.0   (Normal Range: 5.0-8.0) Protein: negative   (Normal Range: Negative) Urobilinogen: 0.2   (Normal Range: 0-1) Nitrite: negative   (Normal Range: Negative) Leukocyte Esterace: negative   (Normal Range: Negative)      Wet Mount Wet Mount KOH: Negative    Appended Document: Bacterial Vaginosis     Allergies: No Known Drug Allergies   Complete Medication List: 1)  Lisinopril-hydrochlorothiazide 20-25 Mg Tabs (Lisinopril-hydrochlorothiazide) .Marland Kitchen.. 1 by mouth daily for blood pressure 2)  Toprol Xl 100 Mg Xr24h-tab (Metoprolol succinate) .... One tablet by mouth daily for blood pressure 3)  Proventil Hfa 108 (90 Base) Mcg/act Aers (Albuterol sulfate) .... 2 puffs every 6 hours as needed for shortness of breath 4)  Diovan 160 Mg Tabs (Valsartan) .... One tablet by mouth daily for blood pressure 5)  Clonidine Hcl 0.2 Mg Tabs (Clonidine hcl) .... One tablet by mouth nightly for blood pressure 6)  Tamoxifen Citrate 20 Mg Tabs (Tamoxifen citrate) .... One tablet by mouth daily 7)  Metronidazole 500 Mg Tabs (Metronidazole) .... One tablet by mouth two times a day for infection  Other Orders: Flu Vaccine 10yrs + ([address]) Admin 1st Vaccine (60454)   Orders Added: 1)  Flu Vaccine 67yrs + [09811] 2)  Admin 1st Vaccine [91478]   Immunizations Administered:  Influenza Vaccine # 1:    Vaccine Type: Fluvax 3+    Site: left deltoid    Mfr: GlaxoSmithKline    Dose: 0.5 ml    Route: IM    Given by: Levon Hedger    Exp. Date: 05/03/2011    Lot #: [id number]    VIS given: 05/28/10 version given December 03, 2010.  Flu  Vaccine Consent Questions:    Do you have a history of severe allergic reactions to this vaccine? no    Any prior history of allergic reactions to egg and/or gelatin? no    Do you have a sensitivity to the preservative Thimersol? no    Do you have a past history of Guillan-Barre Syndrome? no    Do you currently have an acute febrile illness? no    Have you ever had a  severe reaction to latex? no    Vaccine information given and explained to patient? yes    Are you currently pregnant? no    ndc  [id number]  Immunizations Administered:  Influenza Vaccine # 1:    Vaccine Type: Fluvax 3+    Site: left deltoid    Mfr: GlaxoSmithKline    Dose: 0.5 ml    Route: IM    Given by: Levon Hedger    Exp. Date: 05/03/2011    Lot #: [id number]    VIS given: 05/28/10 version given December 03, 2010.

## 2010-12-11 NOTE — Assessment & Plan Note (Signed)
Summary: Acute - Allergic Reaction   Vital Signs:  Patient profile:   45 year old female Menstrual status:  irregular Height:      62.5 inches Weight:      151.4 pounds BMI:     27.35 Temp:     97.1 degrees F oral Pulse rate:   80 / minute Pulse rhythm:   regular Resp:     18 per minute BP sitting:   114 / 82  (left arm) Cuff size:   regular  Vitals Entered By: [place] Shannon (December 05, 2010 8:42 AM)  Nutrition Counseling: Patient's BMI is greater than 25 and therefore counseled on weight management options. CC: a allergic reaction to medication prescribed on tuesday (metronidazol),lip swollen for about 1 day, patient want s discuss thyroids Is Patient Diabetic? No Pain Assessment Patient in pain? no       Does patient need assistance? Functional Status Self care Ambulation Normal   CC:  a allergic reaction to medication prescribed on tuesday (metronidazol), lip swollen for about 1 day, and patient want s discuss thyroids.  History of Present Illness:  Pt into the office for an allergic reaction to Flagyl. She was seen in this office 2 days ago - Tuesday. She started Rx on Tuesday night. on wednesday she took 2 doses of the medications. swelling started 9pm last night.   She took benadryl because upper lip was puffy. Then early this morning about 1AM she noticed her upper lip was still swollen. Face felt a little itchy  No scrathy throat No shortness of breath No fever No benadryl yet this morning.  Pt went to work this morning to open up and then she left to come into this office.  Habits & Providers  Alcohol-Tobacco-Diet     Alcohol drinks/day: >5     Alcohol type: beer     >5/day in last 3 mos: yes     Tobacco Status: current     Tobacco Counseling: to remain off tobacco products     Cigarette Packs/Day: 0.75  Exercise-Depression-Behavior     Drug Use: no     Seat Belt Use: 100     Sun Exposure: occasionally  Current Medications (verified): 1)   Lisinopril-Hydrochlorothiazide 20-25 Mg  Tabs (Lisinopril-Hydrochlorothiazide) .Marland Kitchen.. 1 By Mouth Daily For Blood Pressure 2)  Toprol Xl 100 Mg Xr24h-Tab (Metoprolol Succinate) .... One Tablet By Mouth Daily For Blood Pressure 3)  Proventil Hfa 108 (90 Base) Mcg/act Aers (Albuterol Sulfate) .... 2 Puffs Every 6 Hours As Needed For Shortness of Breath 4)  Diovan 160 Mg Tabs (Valsartan) .... One Tablet By Mouth Daily For Blood Pressure 5)  Clonidine Hcl 0.2 Mg Tabs (Clonidine Hcl) .... One Tablet By Mouth Nightly For Blood Pressure 6)  Tamoxifen Citrate 20 Mg Tabs (Tamoxifen Citrate) .... One Tablet By Mouth Daily 7)  Metronidazole 500 Mg Tabs (Metronidazole) .... One Tablet By Mouth Two Times A Day For Infection  Allergies (verified): 1)  ! Metronidazole  Review of Systems General:  upper lip swelling. CV:  Denies chest pain or discomfort. Resp:  Denies cough. GI:  Complains of abdominal pain; denies nausea and vomiting.  Physical Exam  General:  alert.   Head:  normocephalic.   Mouth:  upper lip swelling  bottom lip normal Lungs:  normal breath sounds.   Heart:  normal rate and regular rhythm.     Impression & Recommendations:  Problem # 1:  ADVERSE DRUG REACTION (ICD-995.20)  Advised pt that she  had an allergic reaction to Flagyl She did take a dose of the medication this morning - advised pt not to take any more Depomedrol 80mg  IM given in office today she can take benadryl at night but not during the day as this will make her sleepy may take antihistamine - claritin OTC during the day  Orders: Admin of Therapeutic Inj  intramuscular or subcutaneous ([address]) Depo- Medrol 80mg  (J1040)  Problem # 2:  CONSTIPATION (ICD-564.00) advised pt to start a high fiber diet also start suppositories  Problem # 3:  FIBROIDS, UTERUS (ICD-218.9) known history advised pt that she can f/u with GYN as she has been there before  Complete Medication List: 1)  Lisinopril-hydrochlorothiazide  20-25 Mg Tabs (Lisinopril-hydrochlorothiazide) .Marland Kitchen.. 1 by mouth daily for blood pressure 2)  Toprol Xl 100 Mg Xr24h-tab (Metoprolol succinate) .... One tablet by mouth daily for blood pressure 3)  Proventil Hfa 108 (90 Base) Mcg/act Aers (Albuterol sulfate) .... 2 puffs every 6 hours as needed for shortness of breath 4)  Diovan 160 Mg Tabs (Valsartan) .... One tablet by mouth daily for blood pressure 5)  Clonidine Hcl 0.2 Mg Tabs (Clonidine hcl) .... One tablet by mouth nightly for blood pressure 6)  Tamoxifen Citrate 20 Mg Tabs (Tamoxifen citrate) .... One tablet by mouth daily 7)  Hydrocortisone Acetate 25 Mg Supp (Hydrocortisone acetate) .... One suppository per rectum daily as needed for internal hemorrhoids 8)  Loratadine 10 Mg Tabs (Loratadine) .... One tablet by mouth daily for allergic reaction  Patient Instructions: 1)  Do not take any more of the Metroconazole 2)  Internal hemmorrhoids - eat a high fiber diet over the weekend - oatmeal, apples with peelings, raisin brain. 3)  Use one of the suppositories nightly to get the medications into the hemorrhoids 4)  You can take benadryl at night for swelling. Take for the next 3 nights.  Take at night since this will make you sleepy. 5)  no antihistamines in office.  Prescription written for loratadine 10mg  by mouth daily.  You can take this during the day because it will not make you sleepy. 6)  Monitor symptoms over the weekend. Will not prescribe anything else for pelvic pain at this time because I want to make sure this reaction is better first.  Call this office on Monday to report your symptoms.  Be sure to eat a high fiber diet between now and then. Prescriptions: LORATADINE 10 MG TABS (LORATADINE) One tablet by mouth daily for allergic reaction  #10 x 0   Entered and Authorized by:   Lehman Prom FNP   Signed by:   Lehman Prom FNP on 12/05/2010   Method used:   Print then Give to Patient   RxID:    [id number] HYDROCORTISONE ACETATE 25 MG SUPP (HYDROCORTISONE ACETATE) One suppository per rectum daily as needed for internal hemorrhoids  #20 x 0   Entered and Authorized by:   Lehman Prom FNP   Signed by:   Lehman Prom FNP on 12/05/2010   Method used:   Print then Give to Patient   RxID:   [id number]    Medication Administration  Injection # 1:    Medication: Depo- Medrol 80mg     Diagnosis: ADVERSE DRUG REACTION (ICD-995.20)    Route: IM    Site: L deltoid    Exp Date: 06/03/2011    Lot #: [id number]    Mfr: [employer]    Comments: NDC: 2952-8413-24    Patient tolerated injection without complications  Given by: Hale Drone CMA (December 05, 2010 10:23 AM)  Orders Added: 1)  Est. Patient Level III [36644] 2)  Admin of Therapeutic Inj  intramuscular or subcutaneous [96372] 3)  Depo- Medrol 80mg  [J1040]

## 2010-12-19 NOTE — Progress Notes (Signed)
Summary: Update  Phone Note Call from Patient   Summary of Call: pt says she wanted to let you know that she is feeling a lot better and the swelling has gone down and her insides feels better Initial call taken by: [place] Shannon,  December 09, 2010 10:20 AM  Follow-up for Phone Call        great to hear.  thanks to pt for the update Follow-up by: Lehman Prom FNP,  December 09, 2010 2:17 PM

## 2010-12-23 ENCOUNTER — Telehealth (INDEPENDENT_AMBULATORY_CARE_PROVIDER_SITE_OTHER): Payer: Self-pay | Admitting: Nurse Practitioner

## 2010-12-31 NOTE — Progress Notes (Signed)
Summary: Tamoxifen refill  Phone Note Outgoing Call   Summary of Call: notify pt that i have checked with [place] pharmacy and they can get tamoxifen. will send tamoxifen 20mg  by mouth daily to [facility] pharmacy and she can pick up from there advise pt that she should be taking this daily as per the advisement of oncology Initial call taken by: Lehman Prom FNP,  December 23, 2010 2:24 PM  Follow-up for Phone Call        left message on machine for pt to return call to the office. Levon Hedger  December 23, 2010 5:03 PM   pt is aware.. Arm[place] Shannon CMA  December 24, 2010 9:24 AM     New/Updated Medications: TAMOXIFEN CITRATE 20 MG TABS (TAMOXIFEN CITRATE) One tablet by mouth daily Prescriptions: TAMOXIFEN CITRATE 20 MG TABS (TAMOXIFEN CITRATE) One tablet by mouth daily  #30 x 11   Entered and Authorized by:   Lehman Prom FNP   Signed by:   Lehman Prom FNP on 12/23/2010   Method used:   Faxed to ...       [facility] (retail)       [address] [place], [place]  [address]       Ph: [phone]       Fax: [phone]   RxID:   [id number]

## 2011-01-19 LAB — CBC
HCT: 40.5 % (ref 36.0–46.0)
MCHC: 33.6 g/dL (ref 30.0–36.0)
MCV: 94.2 fL (ref 78.0–100.0)
Platelets: 229 10*3/uL (ref 150–400)
WBC: 5.7 10*3/uL (ref 4.0–10.5)

## 2011-01-19 LAB — DIFFERENTIAL
Lymphocytes Relative: 24 % (ref 12–46)
Lymphs Abs: 1.4 10*3/uL (ref 0.7–4.0)
Monocytes Absolute: 0.6 10*3/uL (ref 0.1–1.0)
Monocytes Relative: 11 % (ref 3–12)
Neutro Abs: 2.9 10*3/uL (ref 1.7–7.7)

## 2011-01-19 LAB — COMPREHENSIVE METABOLIC PANEL
Albumin: 4.4 g/dL (ref 3.5–5.2)
Alkaline Phosphatase: 120 U/L — ABNORMAL HIGH (ref 39–117)
BUN: 9 mg/dL (ref 6–23)
Creatinine, Ser: 0.45 mg/dL (ref 0.4–1.2)
Potassium: 3.8 mEq/L (ref 3.5–5.1)
Total Protein: 6.9 g/dL (ref 6.0–8.3)

## 2011-01-20 LAB — BASIC METABOLIC PANEL
BUN: 9 mg/dL (ref 6–23)
CO2: 26 mEq/L (ref 19–32)
Chloride: 106 mEq/L (ref 96–112)
Glucose, Bld: 92 mg/dL (ref 70–99)
Potassium: 3.9 mEq/L (ref 3.5–5.1)

## 2011-01-21 LAB — COMPREHENSIVE METABOLIC PANEL
Alkaline Phosphatase: 140 U/L — ABNORMAL HIGH (ref 39–117)
BUN: 13 mg/dL (ref 6–23)
CO2: 27 mEq/L (ref 19–32)
Calcium: 9.6 mg/dL (ref 8.4–10.5)
GFR calc non Af Amer: >60 mL/min (ref >60)
Glucose, Bld: 95 mg/dL (ref 70–99)
Potassium: 4 mEq/L (ref 3.5–5.1)
Total Protein: 6.9 g/dL (ref 6.0–8.3)

## 2011-01-21 LAB — DIFFERENTIAL
Basophils Relative: 0 % (ref 0–1)
Monocytes Relative: 11 % (ref 3–12)
Neutro Abs: 2.5 10*3/uL (ref 1.7–7.7)
Neutrophils Relative %: 64 % (ref 43–77)

## 2011-01-21 LAB — FOLLICLE STIMULATING HORMONE: FSH: 12.5 m[IU]/mL

## 2011-01-21 LAB — METANEPHRINES, URINE, 24 HOUR: Volume, Urine-METAN: 2375 mL

## 2011-01-21 LAB — ESTRADIOL: Estradiol: 126.2 pg/mL

## 2011-01-21 LAB — CBC
HCT: 41.4 % (ref 36.0–46.0)
Hemoglobin: 13.8 g/dL (ref 12.0–15.0)
MCHC: 33.2 g/dL (ref 30.0–36.0)
RDW: 16.1 % — ABNORMAL HIGH (ref 11.5–15.5)

## 2011-01-21 LAB — PROTIME-INR: Prothrombin Time: 12.5 seconds (ref 11.6–15.2)

## 2011-02-06 LAB — CROSSMATCH
ABO/RH(D): O POS
Antibody Screen: NEGATIVE

## 2011-02-06 LAB — ABO/RH: ABO/RH(D): O POS

## 2011-02-08 LAB — POCT I-STAT, CHEM 8
Chloride: 104 mEq/L (ref 96–112)
HCT: 31 % — ABNORMAL LOW (ref 36.0–46.0)
Hemoglobin: 10.5 g/dL — ABNORMAL LOW (ref 12.0–15.0)
Potassium: 3.9 mEq/L (ref 3.5–5.1)
Sodium: 137 mEq/L (ref 135–145)

## 2011-02-19 ENCOUNTER — Encounter (HOSPITAL_BASED_OUTPATIENT_CLINIC_OR_DEPARTMENT_OTHER): Payer: Self-pay | Admitting: Oncology

## 2011-02-19 ENCOUNTER — Other Ambulatory Visit: Payer: Self-pay | Admitting: Oncology

## 2011-02-19 DIAGNOSIS — C50419 Malignant neoplasm of upper-outer quadrant of unspecified female breast: Secondary | ICD-10-CM

## 2011-02-19 DIAGNOSIS — Z853 Personal history of malignant neoplasm of breast: Secondary | ICD-10-CM

## 2011-02-19 DIAGNOSIS — I10 Essential (primary) hypertension: Secondary | ICD-10-CM

## 2011-02-19 LAB — CBC WITH DIFFERENTIAL/PLATELET
BASO%: 0.8 % (ref 0.0–2.0)
Basophils Absolute: 0 10*3/uL (ref 0.0–0.1)
EOS%: 6.6 % (ref 0.0–7.0)
HCT: 35.4 % (ref 34.8–46.6)
HGB: 11.8 g/dL (ref 11.6–15.9)
MCH: 28.9 pg (ref 25.1–34.0)
MONO#: 0.4 10*3/uL (ref 0.1–0.9)
NEUT#: 1.5 10*3/uL (ref 1.5–6.5)
NEUT%: 41.6 % (ref 38.4–76.8)
RDW: 13.6 % (ref 11.2–14.5)
WBC: 3.7 10*3/uL — ABNORMAL LOW (ref 3.9–10.3)
lymph#: 1.4 10*3/uL (ref 0.9–3.3)

## 2011-02-19 LAB — COMPREHENSIVE METABOLIC PANEL
AST: 18 U/L (ref 0–37)
Albumin: 4.2 g/dL (ref 3.5–5.2)
Alkaline Phosphatase: 62 U/L (ref 39–117)
BUN: 12 mg/dL (ref 6–23)
Calcium: 9.7 mg/dL (ref 8.4–10.5)
Chloride: 105 mEq/L (ref 96–112)
Glucose, Bld: 100 mg/dL — ABNORMAL HIGH (ref 70–99)
Potassium: 3.8 mEq/L (ref 3.5–5.3)
Sodium: 136 mEq/L (ref 135–145)
Total Protein: 6.6 g/dL (ref 6.0–8.3)

## 2011-03-18 NOTE — Op Note (Signed)
NAMESHAQUALA, BROEKER                ACCOUNT NO.:  [id number]   MEDICAL RECORD NO.:  [id number]          PATIENT TYPE:  AMB   LOCATION:  DSC                          FACILITY:  [facility]   PHYSICIAN:  Ollen Gross. Vernell Morgans, M.D. DATE OF BIRTH:  01-26-1966   DATE OF PROCEDURE:  06/20/2009  DATE OF DISCHARGE:                               OPERATIVE REPORT   PREOPERATIVE DIAGNOSIS:  Right breast cancer.   POSTOPERATIVE DIAGNOSIS:  Right breast cancer.   PROCEDURE:  Placement of left subclavian vein Port-A-Cath.   SURGEON:  Ollen Gross. Vernell Morgans, MD   ANESTHESIA:  General via LMA.   PROCEDURE:  After informed consent was obtained, the patient was brought  to the operating room and placed in supine position on the operating  room table.  After adequate induction of general anesthesia, the  patient's left chest and neck area were prepped with Betadine and draped  in usual sterile manner.  The patient was placed in Trendelenburg  position.  The left chest area was infiltrated with 0.5% Marcaine.  A  small incision was made with 15-blade knife just lateral to the bend of  the clavicle on the left chest.  A large-bore 500 needle was then used  to slide beneath the bend of the clavicle aiming towards the sternal  notch and we were readily able to find the left subclavian vein.  A wire  was placed through the needle using the Seldinger technique without  difficulty.  The wire was confirmed in the central venous system using  real time fluoroscopy.  Next, the incision was extended medially and  laterally and a subcutaneous pocket was created by combination of blunt  finger dissection and some sharp dissection with electrocautery.  Once  this was accomplished, the tubing was placed on the well of the port and  the anchor was left loose.  The well was placed in the subcutaneous  pocket and the length of the tubing was estimated again using real time  fluoroscopy.  The catheter was cut to about 20 cm.  At  this point, the  sheath and dilator were then placed over the wire also using the  Seldinger technique without difficulty.  The wire and dilator were  removed.  The catheter tubing was fed through the sheath as far as it  could go and then held in place while the sheath was gently cracked and  separated, keeping the tubing in place.  Once this was accomplished,  fluoro imaging showed the tip of the catheter to appear to be in the  superior vena cava.  It did appear as though that the tip of the  catheter was running into something though we tried pulling the catheter  back several times and refeeding it, it would still go into what looks  like the superior vena cava, but it appeared as though the catheter was  running into something.  The catheter would actually aspirate and  flushed very easily.  We therefore placed the anchor on the well.  The  well of the catheter was anchored in its pocket  using 2 interrupted 2-0  Prolene stitches.  The port was aspirated and flushed first with a  dilute heparin solution and then with a more concentrated heparin  solution.  This all went in easily.  The subcutaneous tissue was then  closed over the port with interrupted 3-0 Vicryl stitches and the skin  was closed with a  running 4-0 Monocryl subcuticular stitch.  A Dermabond dressing was  applied.  The patient tolerated the procedure well.  At the end of the  case, all needle, sponge, and instrument counts were correct.  The  patient was then awakened and taken to recovery room in stable  condition.      Ollen Gross. Vernell Morgans, M.D.  Electronically Signed     PST/MEDQ  D:  06/20/2009  T:  06/20/2009  Job:  [id number]

## 2011-04-23 ENCOUNTER — Other Ambulatory Visit: Payer: Self-pay | Admitting: Obstetrics & Gynecology

## 2011-04-23 ENCOUNTER — Ambulatory Visit (INDEPENDENT_AMBULATORY_CARE_PROVIDER_SITE_OTHER): Payer: Self-pay | Admitting: Obstetrics & Gynecology

## 2011-04-23 DIAGNOSIS — D259 Leiomyoma of uterus, unspecified: Secondary | ICD-10-CM

## 2011-04-23 DIAGNOSIS — D219 Benign neoplasm of connective and other soft tissue, unspecified: Secondary | ICD-10-CM

## 2011-04-24 NOTE — Group Therapy Note (Unsigned)
Tammy Blanchard, Tammy Blanchard                ACCOUNT NO.:  [id number]  MEDICAL RECORD NO.:  [id number]           PATIENT TYPE:  A  LOCATION:  [facility]                   FACILITY:  [facility]  PHYSICIAN:  Jaynie Collins, MD     DATE OF BIRTH:  [birth year]  DATE OF SERVICE:  04/23/2011                                 CLINIC NOTE  REASON FOR VISIT:  Followup fibroids.  HISTORY OF PRESENT ILLNESS:  The patient is a 45 year old gravida 6, para 4-0-2-6 who is followed in this clinic for fibroids.  The patient does have a history significant for breast cancer status post lumpectomy, radiation and chemotherapy.  As for her fibroids, the patient did have a pelvic ultrasound in October 2010 which showed a 17- week sized uterus with multiple fibroids, the largest measuring about 7.3 cm in diameter.  She was noted to have a normal endometrial stripe and normal adnexa.  Her last Pap smear was benign in July 2011 and she also had an endometrial biopsy at the same time which was also benign. The patient had been counseled regarding using Depo-Lupron to see if this could help reduce the size of her fibroids and she did not want to try this modality at that point.  Today, she does want to know what options are available for management.  She is denying any pelvic pain or abnormal uterine bleeding.  She is having regular monthly periods.  She said that she was amenorrheic for a few months while she was undergoing chemo and radiation but now her periods have come back.  She denies any intermenstrual bleeding, abnormal vaginal discharge, or any other gynecologic concerns.  PHYSICAL EXAMINATION:  The patient is palpated to have about a 20-22 weeks sized fibroid globular uterus.  There is no tenderness on examination.  Adnexa could not be palpated secondary to the large fibroid uterus.  No other anomalies on exam were seen.  ASSESSMENT AND PLAN:  Given that the uterus is palpated and larger than was noted on  ultrasound in 2010, another ultrasound will be scheduled to have definitive measurements of her uterus and also for evaluation of her adnexa.  The patient was told that given the size of her uterus, Depo-Lupron will be recommended again to see if this will help in reducing the size of the fibroids prior to surgery.  She was told the surgery will be recommended given that her fibroids are increasing in size.  The patient does want to avoid surgery at this point and says that she will think about it and get back to [place].  Routine risks of surgery were discussed with the patient and all her other questions were answered.  Of note, the patient is due for another Pap smear after May 11, 2011 next month and so she will come back for her Pap smear and further discussion about management of these fibroids.          ______________________________ Jaynie Collins, MD    UA/MEDQ  D:  04/23/2011  T:  04/24/2011  Job:  [id number]

## 2011-05-08 ENCOUNTER — Other Ambulatory Visit (HOSPITAL_COMMUNITY): Payer: Self-pay

## 2011-05-08 ENCOUNTER — Ambulatory Visit (HOSPITAL_COMMUNITY)
Admission: RE | Admit: 2011-05-08 | Discharge: 2011-05-08 | Disposition: A | Discharge status: Home / Self Care | Payer: Self-pay | Source: Ambulatory Visit | Attending: Obstetrics & Gynecology | Admitting: Obstetrics & Gynecology

## 2011-05-08 DIAGNOSIS — D259 Leiomyoma of uterus, unspecified: Secondary | ICD-10-CM | POA: Insufficient documentation

## 2011-05-08 DIAGNOSIS — D219 Benign neoplasm of connective and other soft tissue, unspecified: Secondary | ICD-10-CM

## 2011-05-08 DIAGNOSIS — N838 Other noninflammatory disorders of ovary, fallopian tube and broad ligament: Secondary | ICD-10-CM | POA: Insufficient documentation

## 2011-05-08 DIAGNOSIS — N926 Irregular menstruation, unspecified: Secondary | ICD-10-CM | POA: Insufficient documentation

## 2011-05-28 ENCOUNTER — Ambulatory Visit: Payer: Self-pay | Admitting: Obstetrics & Gynecology

## 2011-05-28 ENCOUNTER — Ambulatory Visit (INDEPENDENT_AMBULATORY_CARE_PROVIDER_SITE_OTHER): Payer: Self-pay | Admitting: Obstetrics & Gynecology

## 2011-05-28 ENCOUNTER — Encounter: Payer: Self-pay | Admitting: Obstetrics & Gynecology

## 2011-05-28 DIAGNOSIS — D251 Intramural leiomyoma of uterus: Secondary | ICD-10-CM

## 2011-05-28 DIAGNOSIS — R109 Unspecified abdominal pain: Secondary | ICD-10-CM

## 2011-05-28 NOTE — Patient Instructions (Signed)
Fibroids (Leiomyoma's)   You have been diagnosed as having a fibroid. Fibroids are smooth muscle lumps (tumors) which can occur any place in a woman's body. They are usually in the womb (uterus). The most common problem (symptom) of fibroids is bleeding. Over time this may cause low red blood cells (anemia). Other symptoms include feelings of pressure and pain in the pelvis. The diagnosis (learning what is wrong) of fibroids is made by physical exam. Sometimes tests such as an ultrasound are used. This is helpful when fibroids are felt around the ovaries and to look for tumors.   TREATMENT   Most fibroids do not need surgical or medical treatment. Sometimes a tissue sample (biopsy) of the lining of the uterus is done to rule out cancer. If there is no cancer and only a small amount of bleeding, the problem can be watched.   Hormonal treatment can improve the problem.   When surgery is needed, it can consist of removing the fibroid. Vaginal birth may not be possible after the removal of fibroids. This depends on where they are and the extent of surgery. When pregnancy occurs with fibroids it is usually normal.   Your caregiver can help decide which treatments are best for you.   HOME CARE INSTRUCTIONS   Do not use aspirin as this may increase bleeding problems.   If your periods (menses) are heavy, record the number of pads or tampons used per month. Bring this information to your caregiver. This can help them determine the best treatment for you.   SEEK IMMEDIATE MEDICAL ATTENTION IF:   You have pelvic pain or cramps not controlled with medications, or experience a sudden increase in pain.   You have an increase of pelvic bleeding between and during menses.   You feel lightheaded or have fainting spells.   You develop worsening belly (abdominal) pain.   Document Released: 10/17/2000 Document Re-Released: 01/27/2008   Exit[employer]® Patient Information ©2011 [employer].

## 2011-05-28 NOTE — Progress Notes (Signed)
  Subjective:     Tammy Blanchard is a 45 y.o. female who presents for evaluation of fibroid uterus, previously diagnosed, with occasional abdominal pain. The pain is described as cramping, and is minimal in intensity. Pain is located in the suprapubic area without radiation. Onset was intermittent occurring several weeks ago. Symptoms have been unchanged since. Aggravating factors: none. Alleviating factors: none. Associated symptoms: none. The patient denies nausea and bleeding. Risk factors for pelvic/abdominal pain include uterine fibroids. Cycles are light and regular.  Menstrual History: OB History    Grav Para Term Preterm Abortions TAB SAB Ect Mult Living   4 4 3 1      4      Patient's last menstrual period was 05/15/2011.    The following portions of the patient's history were reviewed and updated as appropriate: allergies, current medications, past family history, past medical history, past social history, past surgical history and problem list.   Review of Systems Pertinent items are noted in HPI.    Objective:    BP 128/91  Pulse 69  Temp(Src) 98.3 F (36.8 C) (Oral)  Ht 5\' 1"  (1.549 m)  Wt 150 lb (68.04 kg)  BMI 28.34 kg/m2  LMP 05/15/2011 General:   alert  Lungs:   deferred  Heart:   deferred  Abdomen:  abnormal findings:  firm mass 22 week size  CVA:   deferred  Pelvis:  External genitalia: normal general appearance Vaginal: normal mucosa without prolapse or lesions Cervix: normal appearance Adnexa: no mass Uterus: enlarged  Extremities:   extremities normal, atraumatic, no cyanosis or edema  Neurologic:   negative  Psychiatric:   normal mood, behavior, speech, dress, and thought processes   Lab Review Labs: [facility] reviewed   Imaging Ultrasound - Pelvic Vaginal    Assessment:    Fibroid uterus mild increase since 2010 [place]    Plan:    The diagnosis was discussed with the patient and evaluation and treatment plans outlined. Offered TAH vs. expectant  management. She wants to wait for possible surgery. Return in 3 months.

## 2011-05-29 NOTE — Progress Notes (Signed)
Addended by: Sherre Lain A on: 05/29/2011 02:55 PM   Modules accepted: Orders

## 2011-06-02 ENCOUNTER — Ambulatory Visit
Admission: RE | Admit: 2011-06-02 | Discharge: 2011-06-02 | Disposition: A | Discharge status: Home / Self Care | Payer: Self-pay | Source: Ambulatory Visit | Attending: Oncology | Admitting: Oncology

## 2011-06-02 DIAGNOSIS — Z853 Personal history of malignant neoplasm of breast: Secondary | ICD-10-CM

## 2011-08-04 LAB — CBC
HCT: 28.3 — ABNORMAL LOW
MCV: 68.4 — ABNORMAL LOW
Platelets: 386
RDW: 20.3 — ABNORMAL HIGH

## 2011-08-04 LAB — BASIC METABOLIC PANEL
BUN: 10
CO2: 21
Chloride: 109
Glucose, Bld: 102 — ABNORMAL HIGH
Potassium: 3.5

## 2011-08-04 LAB — DIFFERENTIAL
Basophils Relative: 3 — ABNORMAL HIGH
Eosinophils Absolute: 0.3
Monocytes Absolute: 0.4
Neutro Abs: 3.4

## 2011-08-04 LAB — POCT CARDIAC MARKERS: Troponin i, poc: <0.05

## 2011-08-21 ENCOUNTER — Encounter (HOSPITAL_BASED_OUTPATIENT_CLINIC_OR_DEPARTMENT_OTHER): Payer: Self-pay | Admitting: Oncology

## 2011-08-21 ENCOUNTER — Other Ambulatory Visit: Payer: Self-pay | Admitting: Oncology

## 2011-08-21 DIAGNOSIS — C50419 Malignant neoplasm of upper-outer quadrant of unspecified female breast: Secondary | ICD-10-CM

## 2011-08-21 DIAGNOSIS — C50919 Malignant neoplasm of unspecified site of unspecified female breast: Secondary | ICD-10-CM

## 2011-08-21 DIAGNOSIS — Z923 Personal history of irradiation: Secondary | ICD-10-CM

## 2011-08-21 DIAGNOSIS — Z79810 Long term (current) use of selective estrogen receptor modulators (SERMs): Secondary | ICD-10-CM

## 2011-08-21 DIAGNOSIS — I10 Essential (primary) hypertension: Secondary | ICD-10-CM

## 2011-08-21 LAB — CBC WITH DIFFERENTIAL/PLATELET
Basophils Absolute: 0 10*3/uL (ref 0.0–0.1)
EOS%: 7.3 % — ABNORMAL HIGH (ref 0.0–7.0)
Eosinophils Absolute: 0.3 10*3/uL (ref 0.0–0.5)
HCT: 38.9 % (ref 34.8–46.6)
HGB: 13 g/dL (ref 11.6–15.9)
MCH: 29.5 pg (ref 25.1–34.0)
MCV: 88.4 fL (ref 79.5–101.0)
NEUT#: 1.6 10*3/uL (ref 1.5–6.5)
NEUT%: 35.2 % — ABNORMAL LOW (ref 38.4–76.8)
lymph#: 2 10*3/uL (ref 0.9–3.3)

## 2011-11-26 DIAGNOSIS — C7989 Secondary malignant neoplasm of other specified sites: Secondary | ICD-10-CM

## 2011-11-26 DIAGNOSIS — C50919 Malignant neoplasm of unspecified site of unspecified female breast: Secondary | ICD-10-CM | POA: Insufficient documentation

## 2011-12-06 ENCOUNTER — Telehealth: Payer: Self-pay | Admitting: *Deleted

## 2011-12-06 NOTE — Telephone Encounter (Signed)
Contact numbers listed were called, but the patient could not be reached. The home and cell numbers are d/c, and the mothers number is not accurate

## 2011-12-24 ENCOUNTER — Telehealth: Payer: Self-pay | Admitting: Oncology

## 2011-12-24 NOTE — Telephone Encounter (Signed)
lmonvm for the pt to call me back regarding her April 2013 appts

## 2012-02-12 ENCOUNTER — Other Ambulatory Visit: Payer: Self-pay | Admitting: Lab

## 2012-02-19 ENCOUNTER — Other Ambulatory Visit: Payer: Self-pay | Admitting: Lab

## 2012-02-19 ENCOUNTER — Telehealth: Payer: Self-pay | Admitting: Oncology

## 2012-02-19 ENCOUNTER — Other Ambulatory Visit (HOSPITAL_BASED_OUTPATIENT_CLINIC_OR_DEPARTMENT_OTHER): Payer: 59

## 2012-02-19 ENCOUNTER — Emergency Department (HOSPITAL_COMMUNITY)
Admission: EM | Admit: 2012-02-19 | Discharge: 2012-02-19 | Disposition: A | Discharge status: Home / Self Care | Payer: 59 | Source: Home / Self Care | Attending: Family Medicine | Admitting: Family Medicine

## 2012-02-19 ENCOUNTER — Encounter (HOSPITAL_COMMUNITY): Payer: Self-pay | Admitting: *Deleted

## 2012-02-19 ENCOUNTER — Encounter: Payer: Self-pay | Admitting: Physician Assistant

## 2012-02-19 ENCOUNTER — Ambulatory Visit: Payer: Self-pay | Admitting: Oncology

## 2012-02-19 ENCOUNTER — Ambulatory Visit (HOSPITAL_BASED_OUTPATIENT_CLINIC_OR_DEPARTMENT_OTHER): Payer: 59 | Admitting: Physician Assistant

## 2012-02-19 VITALS — BP 178/129 | HR 73 | Temp 99.0°F | Ht 61.0 in | Wt 157.4 lb

## 2012-02-19 DIAGNOSIS — C50919 Malignant neoplasm of unspecified site of unspecified female breast: Secondary | ICD-10-CM

## 2012-02-19 DIAGNOSIS — I10 Essential (primary) hypertension: Secondary | ICD-10-CM

## 2012-02-19 DIAGNOSIS — D509 Iron deficiency anemia, unspecified: Secondary | ICD-10-CM

## 2012-02-19 DIAGNOSIS — Z853 Personal history of malignant neoplasm of breast: Secondary | ICD-10-CM

## 2012-02-19 LAB — COMPREHENSIVE METABOLIC PANEL
BUN: 14 mg/dL (ref 6–23)
CO2: 24 mEq/L (ref 19–32)
Calcium: 9.5 mg/dL (ref 8.4–10.5)
Chloride: 108 mEq/L (ref 96–112)
Creatinine, Ser: 0.68 mg/dL (ref 0.50–1.10)
Glucose, Bld: 90 mg/dL (ref 70–99)
Total Bilirubin: 0.6 mg/dL (ref 0.3–1.2)

## 2012-02-19 LAB — CBC WITH DIFFERENTIAL/PLATELET
Basophils Absolute: 0 10*3/uL (ref 0.0–0.1)
Eosinophils Absolute: 0.3 10*3/uL (ref 0.0–0.5)
HCT: 41.4 % (ref 34.8–46.6)
LYMPH%: 37 % (ref 14.0–49.7)
MCV: 89.7 fL (ref 79.5–101.0)
MONO#: 0.5 10*3/uL (ref 0.1–0.9)
MONO%: 11.8 % (ref 0.0–14.0)
NEUT#: 1.9 10*3/uL (ref 1.5–6.5)
NEUT%: 44.3 % (ref 38.4–76.8)
Platelets: 241 10*3/uL (ref 145–400)
RBC: 4.61 10*6/uL (ref 3.70–5.45)
nRBC: 0 % (ref 0–0)

## 2012-02-19 LAB — CANCER ANTIGEN 27.29: CA 27.29: 20 U/mL (ref 0–39)

## 2012-02-19 MED ORDER — TAMOXIFEN CITRATE 20 MG PO TABS: 20.0000 mg | ORAL_TABLET | Freq: Every day | ORAL | Status: DC

## 2012-02-19 MED ORDER — CLONIDINE HCL 0.1 MG PO TABS: 0.2000 mg | ORAL_TABLET | Freq: Once | ORAL | Status: DC

## 2012-02-19 NOTE — Progress Notes (Signed)
ID: Tammy Blanchard   DOB: 12-02-1965  MR#: [id number]  LOV#:[id number]  HISTORY OF PRESENT ILLNESS: The [facility] showing dense breasts with microcalcifications in both breasts, so she was called back for bilateral diagnostic mammograms February 18, 2008.  These showed diffuse calcifications, particularly in the lateral aspect of the right breast.  They were felt by Dr. Judyann Munson to be probably benign bilaterally, but to require short interval follow-up.  So she was set up for a six-month mammogram, which she did not show up for.  She also did not return for her April mammography this year.    However, in July, she had discomfort in the right breast and palpated a mass, which she said was also visible to her.  She brought this to the attention of Dr. Delrae Alfred at [facility], and was set up for diagnostic mammography at the [facility] on May 25, 2009.  This again showed dense breasts, but there was now an area of increased density and architectural distortion in the upper-outer right breast, corresponding to the mass palpated by the patient.  Dr. Azucena Kuba was able to palpate the mass as well, and it measured 3.0 cm by ultrasound, being irregularly marginated and inhomogeneous.  In the left breast there was a cluster of microcalcifications, but no ultrasonographic finding.  A decision was made to biopsy both breasts, and this was done on August 2.  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  On the left side there was only atypical ductal hyperplasia.  The invasive right-sided tumor was ER+ at 98%, PR+ at 96+, with an MIB-1 of 44% and was negative for HER-2 amplification by CISH with a ratio of 0.97.   With this information, the patient was referred to Dr. Carolynne Edouard, and bilateral breast MRIs were obtained August 9.  This showed on the right an irregular enhancing mass measuring up to 4.6 cm (including a small anterior  nodular component, which extends within 8 mm of the nipple).  There were no other areas of abnormal enhancement in either breast, and no abnormal appearing lymph nodes bilaterally.    The patient received neoadjuvant chemotherapy consisting of 6 q. three-week doses of docetaxel/doxorubicin/cyclophosphamide, completed in December 2010. 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 3 of 10 lymph nodes were positive. Tumor was strongly ER, PR positive and HER-2/neu negative with a high proliferation fraction.  She received radiation therapy, completed in May of 2011. At that time, she began on tamoxifen which she continues with good tolerance.  INTERVAL HISTORY: Tammy Blanchard returns today for routine six-month followup of her right breast carcinoma. Interval history is unremarkable, and Tammy Blanchard has been doing well. 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 She continues to take care of her grandchildren as well and in fact has her 78-year-old granddaughter, Tammy Blanchard, with her today.  Markiesha continues on her tamoxifen with good tolerance. She does continue to have hot flashes in addition to some clear vaginal discharge. She denies any vaginal bleeding. She's had no peripheral swelling or evidence of abnormal clotting, and denies any change in vision.  REVIEW OF SYSTEMS: [name]'s blood pressure is quite elevated here in the office today at 178/129. Over half of our 45 minute appointment today was spent discussing these issues, and coordinating care. She has already  taken her lisinopril/hydrochlorothiazide, metoprolol, and valsartan. She typically takes clonidine at night. She has occasional headaches, but denies a headache here in the office today. No dizziness. No chest pain or palpitations.  She also denies any recent illnesses, and has had no fevers, chills, or night sweats. No  signs of abnormal bleeding. No nausea or change in bowel habits. No cough or shortness of breath. No unusual myalgias or arthralgias. No peripheral swelling.  A detailed review of systems is otherwise noncontributory.   PAST MEDICAL HISTORY: Past Medical History  Diagnosis Date  . Hypertension   . Hyperlipidemia   . Allergy   . Anemia   . Cancer     breast  . GERD (gastroesophageal reflux disease)   . Thyroid disease     PAST SURGICAL HISTORY: Past Surgical History  Procedure Date  . Cesarean section   . Wisdom tooth extraction   . Breast surgery   . Tubal ligation     FAMILY HISTORY Family History  Problem Relation Age of Onset  . Diabetes Mother   . Hypertension Mother   . Diabetes Maternal Aunt   . Heart disease Maternal Aunt   . Hypertension Maternal Aunt   . Stroke Maternal Aunt   . Diabetes Maternal Grandmother   . Heart disease Maternal Grandmother   . Hypertension Maternal Grandmother   . Stroke Maternal Grandmother   . Diabetes Maternal Aunt   . Hypertension Maternal Aunt     GYNECOLOGIC HISTORY: She is GX P4, first pregnancy at age 46.  Last menstrual period July 25, and she actually had two periods in July, which is a little unusual for her.  She used birth control for approximately one year, with no side effects as she can recall, except that her periods completely stopped.   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  She is widowed.  She tells me her husband died, was hit by [place].  At home is her 87 year old son Tammy Blanchard, who is a high school graduate looking for a job.  Son, Tammy Blanchard, 20, lives nearby.  He works as English as a second language teacher and is also attending college.  Daughter, Tammy Blanchard, 48 is present today.  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  Son, Tammy Blanchard, 26, lives by himself, and has recently been laid off from his work.  The patient is a member of [employer].     ADVANCED DIRECTIVES:  HEALTH  MAINTENANCE: History  Substance Use Topics  . Smoking status: Current Everyday Smoker -- 0.2 packs/day    Types: Cigarettes  . Smokeless tobacco: Never Used  . Alcohol Use: Yes     Colonoscopy:  PAP:  Bone density:  Lipid panel:  Allergies  Allergen Reactions  . Metronidazole     Current Outpatient Prescriptions  Medication Sig Dispense Refill  . cloNIDine (CATAPRES) 0.2 MG tablet Take 0.2 mg by mouth at bedtime.        Marland Kitchen lisinopril-hydrochlorothiazide (PRINZIDE,ZESTORETIC) 20-25 MG per tablet Take 1 tablet by mouth daily.        . metoprolol (TOPROL-XL) 100 MG 24 hr tablet Take 100 mg by mouth daily.        . tamoxifen (NOLVADEX) 20 MG tablet Take 1 tablet (20 mg total) by mouth daily.  90 tablet  3  . valsartan (DIOVAN) 160 MG tablet Take 160 mg by mouth daily.         Current Facility-Administered Medications  Medication Dose  Route Frequency Provider Last Rate Last Dose  . cloNIDine (CATAPRES) tablet 0.2 mg  0.2 mg Oral Once Catalina Gravel, PA        OBJECTIVE: Filed Vitals:   02/19/12 1033  BP: 178/129  Pulse: 73  Temp: 99 F (37.2 C)     Body mass index is 29.74 kg/(m^2).    ECOG FS: 0 Physical Exam: HEENT:  Sclerae anicteric, conjunctivae pink.  Oropharynx clear.  No mucositis or candidiasis.   Nodes:  No cervical, supraclavicular, or axillary lymphadenopathy palpated.  Breast Exam:  Right breast is status post lumpectomy. No suspicious nodularity or skin changes. No evidence of local recurrence. Left breast is benign, with no masses, skin changes, or nipple inversion. Lungs:  Clear to auscultation bilaterally.  No crackles, rhonchi, or wheezes.   Heart:  Regular rate and rhythm.   Abdomen:  Soft, nontender.  Positive bowel sounds.  No organomegaly or masses palpated.   Musculoskeletal:  No focal spinal tenderness to palpation.  Extremities:  Benign.  No peripheral edema or cyanosis.   Skin:  Benign.   Neuro:  Nonfocal.   LAB RESULTS: Lab Results  Component  Value Date   WBC 4.2 02/19/2012   NEUTROABS 1.9 02/19/2012   HGB 13.6 02/19/2012   HCT 41.4 02/19/2012   MCV 89.7 02/19/2012   PLT 241 02/19/2012      Chemistry      Component Value Date/Time   NA 136 02/19/2011 0922   NA 136 02/19/2011 0922   NA 136 02/19/2011 0922   K 3.8 02/19/2011 0922   K 3.8 02/19/2011 0922   K 3.8 02/19/2011 0922   CL 105 02/19/2011 0922   CL 105 02/19/2011 0922   CL 105 02/19/2011 0922   CO2 20 02/19/2011 0922   CO2 20 02/19/2011 0922   CO2 20 02/19/2011 0922   BUN 12 02/19/2011 0922   BUN 12 02/19/2011 0922   BUN 12 02/19/2011 0922   CREATININE 0.51 02/19/2011 0922   CREATININE 0.51 02/19/2011 0922   CREATININE 0.51 02/19/2011 0922      Component Value Date/Time   CALCIUM 9.7 02/19/2011 0922   CALCIUM 9.7 02/19/2011 0922   CALCIUM 9.7 02/19/2011 0922   ALKPHOS 62 02/19/2011 0922   ALKPHOS 62 02/19/2011 0922   ALKPHOS 62 02/19/2011 0922   AST 18 02/19/2011 0922   AST 18 02/19/2011 0922   AST 18 02/19/2011 0922   ALT 20 02/19/2011 0922   ALT 20 02/19/2011 0922   ALT 20 02/19/2011 0922   BILITOT 0.7 02/19/2011 0922   BILITOT 0.7 02/19/2011 0922   BILITOT 0.7 02/19/2011 0922       Lab Results  Component Value Date   LABCA2 21 02/19/2011   LABCA2 21 02/19/2011     STUDIES: No results found.  ASSESSMENT: A 46 year old [place] woman   (1) status post bilateral breast biopsies in August 2010.  On the left, only atypical ductal hyperplasia.  On the right, high-grade invasive ductal carcinoma, 4.6 cm by MRI.    (2)  Treated in the neoadjuvant setting with docetaxel, doxorubicin, and cyclophosphamide x6, chemotherapy completed in December 2010.    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  Three of 10 lymph nodes were positive.  Tumor was strongly estrogen receptor/progesterone receptor positive, HER2/neu negative with high  proliferation fraction.   (  4)  Received radiation therapy, completed May 2011,   (5)  began on tamoxifen in May 2011, and continues with good tolerance.   (6) Uncontrolled hypertension  PLAN: With regards to her breast cancer, Cassondra continues to do well, and there is no clinical evidence of disease recurrence at this time. She will continue on tamoxifen, 20 mg daily, which I have refilled for her today.  I am very concerned about her uncontrolled hypertension. We've given her an oral clonidine, 0.2 mg, here in the office today, and have referred her to urgent care for further evaluation. [facility] but recently [facility] for health insurance, and has not established herself with a new primary care physician. She understands that she needs to establish herself with her primary care office, so that these chronic comorbidities may be followed on a regular basis.  Joyanne will be due for her next annual mammogram in July, and will see [place] again in 6 months for routine followup in October. She'll call prior that time with any changes or problems.  Khaliah Barnick    02/19/2012

## 2012-02-19 NOTE — Telephone Encounter (Signed)
gve the pt her oct 2013 appt calendar along with the mammo appt in july

## 2012-02-19 NOTE — ED Notes (Signed)
Pt  Was  Sent  To  [facility]  To   evaul  Her  bp   She   Production assistant, radio    With  Her  Oncologist   This  Am  And  Her  bp was  Elevated    At that time    She  Subsequently  Took  Clonidine   And  Now  It is  Better   She  denys  Any    Symptoms    She  Is  Awake  And  Alert  And  Oriented

## 2012-02-19 NOTE — Discharge Instructions (Signed)
DO NOT SKIP YOUR MEDICATIONS. FOLLOW UP WITH YOUR PCP. AVOID CAFFEINE PRODUCTS.

## 2012-02-19 NOTE — ED Provider Notes (Signed)
History     CSN: [id number]  Arrival date & time 02/19/12  1224   First MD Initiated Contact with Patient 02/19/12 1355      Chief Complaint  Patient presents with  . Hypertension    (Consider location/radiation/quality/duration/timing/severity/associated sxs/prior treatment) HPI Comments: THE PATIENT WAS AT HER ONCOLOGIST OFFICE TODAY . HER BP WAS FOUND TO BE ELEVATED. SHE WAS GIVEN CLONIDINE . BP HAS DECREASED AND NOW SHE FEELS FINE. SHE DID NOT TAKE HER CLONIDINE LAST PB. SHE DENIES N/V, NO DIZZINESS, WEAKNESS, NO HEADACHE, NO BLURRED VISION   Past Medical History  Diagnosis Date  . Hypertension   . Hyperlipidemia   . Allergy   . Anemia   . Cancer     breast  . GERD (gastroesophageal reflux disease)   . Thyroid disease     Past Surgical History  Procedure Date  . Cesarean section   . Wisdom tooth extraction   . Breast surgery   . Tubal ligation     Family History  Problem Relation Age of Onset  . Diabetes Mother   . Hypertension Mother   . Diabetes Maternal Aunt   . Heart disease Maternal Aunt   . Hypertension Maternal Aunt   . Stroke Maternal Aunt   . Diabetes Maternal Grandmother   . Heart disease Maternal Grandmother   . Hypertension Maternal Grandmother   . Stroke Maternal Grandmother   . Diabetes Maternal Aunt   . Hypertension Maternal Aunt     History  Substance Use Topics  . Smoking status: Current Everyday Smoker -- 0.2 packs/day    Types: Cigarettes  . Smokeless tobacco: Never Used  . Alcohol Use: Yes    OB History    Grav Para Term Preterm Abortions TAB SAB Ect Mult Living   4 4 3 1      4       Review of Systems  Constitutional: Negative.   HENT: Negative.   Eyes: Negative.   Respiratory: Negative.   Cardiovascular: Negative.   Gastrointestinal: Negative.   Genitourinary: Negative.   Musculoskeletal: Negative.   Neurological: Negative.     Allergies  Metronidazole  Home Medications   Current Outpatient Rx  Name Route  Sig Dispense Refill  . CLONIDINE HCL 0.2 MG PO TABS Oral Take 0.2 mg by mouth at bedtime.      Marland Kitchen LISINOPRIL-HYDROCHLOROTHIAZIDE 20-25 MG PO TABS Oral Take 1 tablet by mouth daily.      Marland Kitchen METOPROLOL SUCCINATE ER 100 MG PO TB24 Oral Take 100 mg by mouth daily.      Marland Kitchen TAMOXIFEN CITRATE 20 MG PO TABS Oral Take 1 tablet (20 mg total) by mouth daily. 90 tablet 3  . VALSARTAN 160 MG PO TABS Oral Take 160 mg by mouth daily.        BP 142/97  Pulse 80  Temp(Src) 98.5 F (36.9 C) (Oral)  Resp 14  SpO2 98%  LMP 02/02/2012  Physical Exam  Nursing note and vitals reviewed. Constitutional: She is oriented to person, place, and time. She appears well-developed and well-nourished. No distress.  HENT:  Head: Normocephalic.  Nose: Nose normal.  Mouth/Throat: No oropharyngeal exudate.  Eyes: EOM are normal. Pupils are equal, round, and reactive to light.  Neck: Normal range of motion. Neck supple.  Cardiovascular: Normal rate and regular rhythm.   Pulmonary/Chest: Effort normal and breath sounds normal.  Abdominal: Soft.  Musculoskeletal: She exhibits no edema.  Neurological: She is alert and oriented to person, place, and time.  No cranial nerve deficit.  Skin: Skin is warm and dry.    ED Course  Procedures (including critical care time)  Labs Reviewed - No data to display No results found.   1. Hypertension       MDM          Randa Spike, MD 02/19/12 1430

## 2012-06-02 ENCOUNTER — Ambulatory Visit
Admission: RE | Admit: 2012-06-02 | Discharge: 2012-06-02 | Disposition: A | Discharge status: Home / Self Care | Payer: 59 | Source: Ambulatory Visit | Attending: Physician Assistant | Admitting: Physician Assistant

## 2012-06-02 DIAGNOSIS — Z853 Personal history of malignant neoplasm of breast: Secondary | ICD-10-CM

## 2012-08-16 ENCOUNTER — Other Ambulatory Visit: Payer: 59 | Admitting: Lab

## 2012-08-23 ENCOUNTER — Ambulatory Visit (HOSPITAL_BASED_OUTPATIENT_CLINIC_OR_DEPARTMENT_OTHER): Payer: 59 | Admitting: Oncology

## 2012-08-23 ENCOUNTER — Other Ambulatory Visit (HOSPITAL_BASED_OUTPATIENT_CLINIC_OR_DEPARTMENT_OTHER): Payer: 59 | Admitting: Lab

## 2012-08-23 ENCOUNTER — Telehealth: Payer: Self-pay | Admitting: *Deleted

## 2012-08-23 VITALS — BP 150/95 | HR 91 | Temp 98.8°F | Resp 20 | Ht 61.0 in | Wt 162.0 lb

## 2012-08-23 DIAGNOSIS — C50419 Malignant neoplasm of upper-outer quadrant of unspecified female breast: Secondary | ICD-10-CM

## 2012-08-23 DIAGNOSIS — Z170 Estrogen receptor positive status [ER+]: Secondary | ICD-10-CM

## 2012-08-23 DIAGNOSIS — C50919 Malignant neoplasm of unspecified site of unspecified female breast: Secondary | ICD-10-CM

## 2012-08-23 DIAGNOSIS — I10 Essential (primary) hypertension: Secondary | ICD-10-CM

## 2012-08-23 LAB — COMPREHENSIVE METABOLIC PANEL (CC13)
AST: 22 U/L (ref 5–34)
Albumin: 4.3 g/dL (ref 3.5–5.0)
Alkaline Phosphatase: 157 U/L — ABNORMAL HIGH (ref 40–150)
BUN: 10 mg/dL (ref 7.0–26.0)
Calcium: 10.3 mg/dL (ref 8.4–10.4)
Chloride: 107 mEq/L (ref 98–107)
Glucose: 107 mg/dl — ABNORMAL HIGH (ref 70–99)
Potassium: 4 mEq/L (ref 3.5–5.1)
Sodium: 140 mEq/L (ref 136–145)
Total Protein: 7.3 g/dL (ref 6.4–8.3)

## 2012-08-23 LAB — CBC WITH DIFFERENTIAL/PLATELET
BASO%: 1 % (ref 0.0–2.0)
Basophils Absolute: 0 10*3/uL (ref 0.0–0.1)
EOS%: 5.6 % (ref 0.0–7.0)
HCT: 43 % (ref 34.8–46.6)
HGB: 14.4 g/dL (ref 11.6–15.9)
MCH: 30.3 pg (ref 25.1–34.0)
MONO#: 0.3 10*3/uL (ref 0.1–0.9)
NEUT%: 42.2 % (ref 38.4–76.8)
RDW: 14 % (ref 11.2–14.5)
WBC: 4.2 10*3/uL (ref 3.9–10.3)
lymph#: 1.9 10*3/uL (ref 0.9–3.3)

## 2012-08-23 NOTE — Telephone Encounter (Signed)
08-26-2012 for mammogram and ultrasound of the right  Breast  Gave patient appointment for lab and midlevel in 2014
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
## 2012-08-26 ENCOUNTER — Ambulatory Visit
Admission: RE | Admit: 2012-08-26 | Discharge: 2012-08-26 | Disposition: A | Discharge status: Home / Self Care | Payer: 59 | Source: Ambulatory Visit | Attending: Oncology | Admitting: Oncology

## 2012-08-26 ENCOUNTER — Other Ambulatory Visit: Payer: 59

## 2012-08-26 DIAGNOSIS — C50919 Malignant neoplasm of unspecified site of unspecified female breast: Secondary | ICD-10-CM

## 2012-09-06 ENCOUNTER — Ambulatory Visit (INDEPENDENT_AMBULATORY_CARE_PROVIDER_SITE_OTHER): Payer: 59 | Admitting: Family Medicine

## 2012-09-06 ENCOUNTER — Encounter: Payer: Self-pay | Admitting: Family Medicine

## 2012-09-06 VITALS — BP 133/90 | HR 95 | Temp 99.1°F | Ht 61.0 in | Wt 164.0 lb

## 2012-09-06 DIAGNOSIS — Z01419 Encounter for gynecological examination (general) (routine) without abnormal findings: Secondary | ICD-10-CM

## 2012-09-06 DIAGNOSIS — D219 Benign neoplasm of connective and other soft tissue, unspecified: Secondary | ICD-10-CM

## 2012-09-06 DIAGNOSIS — D259 Leiomyoma of uterus, unspecified: Secondary | ICD-10-CM

## 2012-09-06 DIAGNOSIS — R300 Dysuria: Secondary | ICD-10-CM

## 2012-09-06 DIAGNOSIS — K5900 Constipation, unspecified: Secondary | ICD-10-CM

## 2012-09-06 LAB — POCT URINALYSIS DIP (DEVICE)
Leukocytes, UA: NEGATIVE
Nitrite: NEGATIVE
Protein, ur: NEGATIVE mg/dL
Urobilinogen, UA: 0.2 mg/dL (ref 0.0–1.0)
pH: 7 (ref 5.0–8.0)

## 2012-09-06 NOTE — Patient Instructions (Signed)
Constipation, Adult Constipation is when a person has fewer than 3 bowel movements a week; has difficulty having a bowel movement; or has stools that are dry, hard, or larger than normal. As people grow older, constipation is more common. If you try to fix constipation with medicines that make you have a bowel movement (laxatives), the problem may get worse. Long-term laxative use may cause the muscles of the colon to become weak. A low-fiber diet, not taking in enough fluids, and taking certain medicines may make constipation worse. CAUSES   Certain medicines, such as antidepressants, pain medicine, iron supplements, antacids, and water pills.   Certain diseases, such as diabetes, irritable bowel syndrome (IBS), thyroid disease, or depression.   Not drinking enough water.   Not eating enough fiber-rich foods.   Stress or travel.  Lack of physical activity or exercise.  Not going to the restroom when there is the urge to have a bowel movement.  Ignoring the urge to have a bowel movement.  Using laxatives too much. SYMPTOMS   Having fewer than 3 bowel movements a week.   Straining to have a bowel movement.   Having hard, dry, or larger than normal stools.   Feeling full or bloated.   Pain in the lower abdomen.  Not feeling relief after having a bowel movement. DIAGNOSIS  Your caregiver will take a medical history and perform a physical exam. Further testing may be done for severe constipation. Some tests may include:   A barium enema X-ray to examine your rectum, colon, and sometimes, your small intestine.  A sigmoidoscopy to examine your lower colon.  A colonoscopy to examine your entire colon. TREATMENT  Treatment will depend on the severity of your constipation and what is causing it. Some dietary treatments include drinking more fluids and eating more fiber-rich foods. Lifestyle treatments may include regular exercise. If these diet and lifestyle recommendations  do not help, your caregiver may recommend taking over-the-counter laxative medicines to help you have bowel movements. Prescription medicines may be prescribed if over-the-counter medicines do not work.  HOME CARE INSTRUCTIONS   Increase dietary fiber in your diet, such as fruits, vegetables, whole grains, and beans. Limit high-fat and processed sugars in your diet, such as [place] fries, hamburgers, cookies, candies, and soda.   A fiber supplement may be added to your diet if you cannot get enough fiber from foods.   Drink enough fluids to keep your urine clear or pale yellow.   Exercise regularly or as directed by your caregiver.   Go to the restroom when you have the urge to go. Do not hold it.  Only take medicines as directed by your caregiver. Do not take other medicines for constipation without talking to your caregiver first. SEEK IMMEDIATE MEDICAL CARE IF:   You have bright red blood in your stool.   Your constipation lasts for more than 4 days or gets worse.   You have abdominal or rectal pain.   You have thin, pencil-like stools.  You have unexplained weight loss. MAKE SURE YOU:   Understand these instructions.  Will watch your condition.  Will get help right away if you are not doing well or get worse. Document Released: 07/18/2004 Document Revised: 01/12/2012 Document Reviewed: 09/23/2011 [facility] Patient Information 2013 [place], [place].    Uterine Fibroid A uterine fibroid is a growth (tumor) that occurs in a woman's uterus. This type of tumor is not cancerous and does not spread out of the uterus. A woman can  have one or many fibroids, and the fiboid(s) can become quite large. A fibroid can vary in size, weight, and where it grows in the uterus. Most fibroids do not require medical treatment, but some can cause pain or heavy bleeding during and between periods. CAUSES  A fibroid is the result of a single uterine cell that keeps growing (unregulated), which  is different than most cells in the human body. Most cells have a control mechanism that keeps them from reproducing without control.  SYMPTOMS   Bleeding.  Pelvic pain and pressure.  Bladder problems due to the size of the fibroid.  Infertility and miscarriages depending on the size and location of the fibroid. DIAGNOSIS  A diagnosis is made by physical exam. Your caregiver may feel the lumpy tumors during a pelvic exam. Important information regarding size, location, and number of tumors can be gained by having an ultrasound. It is rare that other tests, such as a CT scan or MRI, are needed. TREATMENT   Your caregiver may recommend watchful waiting. This involves getting the fibroid checked by your caregiver to see if the fibroids grow or shrink.   Hormonal treatment or an intrauterine device (IUD) may be prescribed.   Surgery may be needed to remove the fibroids (myomectomy) or the uterus (hysterectomy). This depends on your situation. When fibroids interfere with fertility and a woman wants to become pregnant, a caregiver may recommend having the fibroids removed.  HOME CARE INSTRUCTIONS  Home care depends on how you were treated. In general:   Keep all follow-up appointments with your caregiver.   Only take medicine as told by your caregiver. Do not take aspirin. It can cause bleeding.   If you have excessive periods and soak tampons or pads in a half hour or less, contact your caregiver immediately. If your periods are troublesome but not so heavy, lie down with your feet raised slightly above your heart. Place cold packs on your lower abdomen.   If your periods are heavy, write down the number of pads or tampons you use per month. Bring this information to your caregiver.   Talk to your caregiver about taking iron pills.   Include green vegetables in your diet.   If you were prescribed a hormonal treatment, take the hormonal medicines as directed.   If you need  surgery, ask your caregiver for information on your specific surgery.  SEEK IMMEDIATE MEDICAL CARE IF:  You have pelvic pain or cramps not controlled with medicines.   You have a sudden increase in pelvic pain.   You have an increase of bleeding between and during periods.   You feel lightheaded or have fainting episodes.  MAKE SURE YOU:  Understand these instructions.  Will watch your condition.  Will get help right away if you are not doing well or get worse. Document Released: 10/17/2000 Document Revised: 01/12/2012 Document Reviewed: 11/10/2011 [facility] Patient Information 2013 [place], [place].

## 2012-09-06 NOTE — Progress Notes (Signed)
Patient ID: Tammy Blanchard, female   DOB: 08-24-1966, 46 y.o.   MRN: [id number]  Annual Well-Woman Exam  S:  Pt is a 46 y.o. [id number] who is here for annual exam/pap smear. Last pap July 2012 normal, 2011 normal. No hx abnl paps. Does have fibroid uterus. C/o sharp pains and pulling sensation in abdomen from umbilicus to pelvis. Intermittent, mostly at night. Brief, resolve spontaneously. Cycles are irregular - every 2-3 months and light - mostly just spotting. This is a change from last year. Does also have urinary urgency/frequency but no dysuria/burning. Hx breast cancer s/p chemo, lumpectomy, radiation. Now on tamoxifen. Last mammogram/work-up in July showed no malignancy. Sees Dr. Nicholos Johns for internal medicine/blood pressure.  REVIEWED past medical and surgical history, social and family history, allergies, current medications, prior labs and imaging.  Review of Systems  Constitutional: Negative for fever, chills, weight loss and malaise/fatigue.  Eyes: Negative for blurred vision and double vision.  Respiratory: Negative for shortness of breath.   Cardiovascular: Negative for chest pain.  Gastrointestinal: Positive for abdominal pain and constipation. Negative for nausea, vomiting, diarrhea and blood in stool.  Genitourinary: Positive for urgency and frequency. Negative for dysuria and hematuria.  Neurological: Negative for dizziness and headaches.   Past Medical History  Diagnosis Date  . Hypertension   . Allergy   . GERD (gastroesophageal reflux disease)   . Thyroid disease   . Cancer     breast  . Anemia     resolved 2011  . Hyperlipidemia     controlled    Patient Active Problem List  Diagnosis  . ADENOCARCINOMA, RIGHT BREAST  . FIBROIDS, UTERUS  . THYROID MASS  . HYPERTHYROIDISM  . HYPERLIPIDEMIA  . ANEMIA-IRON DEFICIENCY  . TOBACCO ABUSE  . HYPERTENSION  . ALLERGIC RHINITIS  . REACTIVE AIRWAY DISEASE  . GERD  . CONSTIPATION  . CALLUSES, FEET, BILATERAL  .  JOINT EFFUSION, LEFT KNEE  . SUBSCAPULARIS SPRAIN AND STRAIN  . MICROSCOPIC HEMATURIA  . VAGINAL DISCHARGE  . ADVERSE DRUG REACTION    PHYSICAL EXAM  Filed Vitals:   09/06/12 1532  BP: 133/90  Pulse: 95  Temp:    Physical Exam  Constitutional: She is oriented to person, place, and time. She appears well-developed and well-nourished. No distress.  HENT:  Head: Normocephalic and atraumatic.  Eyes: Conjunctivae normal and EOM are normal.  Neck: Normal range of motion. Neck supple. No thyromegaly present.  Cardiovascular: Normal rate, regular rhythm and normal heart sounds.   Pulmonary/Chest: Effort normal and breath sounds normal. No respiratory distress.  Abdominal: Bowel sounds are normal. She exhibits distension. There is Tenderness: mild diffuse.. There is no rebound and no guarding.       Firm mass - presumably uterus palpable about 2-3 cm below umbilicus.  Genitourinary:       Normal external genitalia. Normal vagina. Minimal discharge. Normal cervix. No CMT. Uterus enlarged about 18 wk size, firm, mobile.  Musculoskeletal: Normal range of motion. She exhibits no edema and no tenderness.  Neurological: She is alert and oriented to person, place, and time.  Skin: Skin is warm and dry.  Psychiatric: She has a normal mood and affect.   Ultrasound 06/02/2011 Uterus: Is markedly enlarged with a sagittal length of  approximately 18.5 cm, depth of approximately 11.5 cm and width of  approximately 15 cm. This represents an overall increase in uterine  size since the previous exam. Multiple fibroids are suggested.  Individual fibroids are difficult to measure with  the exception of  one in the right fundal region measuring 5.9 x 6.2 x 7.2 cm and in  the anterior lower uterine segment measuring 4.8 x 4.6 x 5.7 cm.  More diffuse fibroid involvement is suggested.  Endometrium: Only the lower uterine segment portion of the canal  can be identified with endovaginal exam and this appears  deviated  anteriorly by a posterior lower uterine segment fibroid which  measures approximately 5 cm x 5 cm. The borders of this fibroid  are incompletely delineated and evaluation above the level of the  lower uterine segment endovaginally is precluded by the large  uterine size.  Right ovary: Is seen well only transabdominally and measures 4.0 x  2.2 x 3.3 cm  Left ovary: Measures 6.3 x 3.0 x 3.6 cm TA and contains a dominant  follicle.  Other findings: No free fluid  IMPRESSION:  Fibroid uterus with measurable fibroids as noted above. Evaluation  of the endometrial lining is precluded by the fibroid uterus. If  full evaluation of the number of fibroids and relationship with the  endometrial lining is desired, pelvic MRI would be recommended  Normal ovaries.  A/P 46 y.o. [id number] with 1.  Fibroid, enlarged uterus with intermittent abdominal pain.  Will get repeat ultrasound. Discussed hysterectomy last year.   2.  Irregular uterine bleeding. On tamoxifen. Will check sono before attempting EMB. Last year's sono could not visualize endometrial lining. May be perimenopausal.  3. Abdominal pain - likely due to enlarged uterus, constipation. Urinary frequency - will check UA. Pain is brief and intermittent. MiraLax, fiber supplement for constipation.   4.  F/U in 2 weeks or after sono to discuss results, options.  Napoleon Form, MD 09/06/2012 5:57 PM

## 2012-09-07 LAB — URINALYSIS, MICROSCOPIC ONLY: Bacteria, UA: NONE SEEN

## 2012-09-07 LAB — URINALYSIS, ROUTINE W REFLEX MICROSCOPIC
Bilirubin Urine: NEGATIVE
Ketones, ur: NEGATIVE mg/dL
Nitrite: NEGATIVE
Protein, ur: NEGATIVE mg/dL
Specific Gravity, Urine: 1.012 (ref 1.005–1.030)
Urobilinogen, UA: 1 mg/dL (ref 0.0–1.0)

## 2012-09-09 ENCOUNTER — Ambulatory Visit (HOSPITAL_COMMUNITY): Payer: 59

## 2012-09-10 ENCOUNTER — Ambulatory Visit (HOSPITAL_COMMUNITY)
Admission: RE | Admit: 2012-09-10 | Discharge: 2012-09-10 | Disposition: A | Discharge status: Home / Self Care | Payer: 59 | Source: Ambulatory Visit | Attending: Family Medicine | Admitting: Family Medicine

## 2012-09-10 DIAGNOSIS — D219 Benign neoplasm of connective and other soft tissue, unspecified: Secondary | ICD-10-CM

## 2012-09-10 DIAGNOSIS — D259 Leiomyoma of uterus, unspecified: Secondary | ICD-10-CM | POA: Insufficient documentation

## 2012-10-04 ENCOUNTER — Encounter: Payer: Self-pay | Admitting: Family Medicine

## 2012-10-04 ENCOUNTER — Ambulatory Visit (INDEPENDENT_AMBULATORY_CARE_PROVIDER_SITE_OTHER): Payer: 59 | Admitting: Family Medicine

## 2012-10-04 VITALS — BP 152/107 | HR 96 | Temp 98.8°F | Resp 12 | Ht 61.0 in | Wt 164.9 lb

## 2012-10-04 DIAGNOSIS — D259 Leiomyoma of uterus, unspecified: Secondary | ICD-10-CM

## 2012-10-04 NOTE — Patient Instructions (Signed)
Hysterectomy Information   A hysterectomy is a procedure where your uterus is surgically removed. It will no longer be possible to have menstrual periods or to become pregnant. The tubes and ovaries can be removed (bilateral salpingo-oopherectomy) during this surgery as well.    REASONS FOR A HYSTERECTOMY  · Persistent, abnormal bleeding.  · Lasting (chronic) pelvic pain or infection.  · The lining of the uterus (endometrium) starts growing outside the uterus (endometriosis).  · The endometrium starts growing in the muscle of the uterus (adenomyosis).  · The uterus falls down into the vagina (pelvic organ prolapse).  · Symptomatic uterine fibroids.  · Precancerous cells.  · Cervical cancer or uterine cancer.  TYPES OF HYSTERECTOMIES  · Supracervical hysterectomy. This type removes the top part of the uterus, but not the cervix.  · Total hysterectomy. This type removes the uterus and cervix.  · Radical hysterectomy. This type removes the uterus, cervix, and the fibrous tissue that holds the uterus in place in the pelvis (parametrium).  WAYS A HYSTERECTOMY CAN BE PERFORMED  · Abdominal hysterectomy. A large surgical cut (incision) is made in the abdomen. The uterus is removed through this incision.  · Vaginal hysterectomy. An incision is made in the vagina. The uterus is removed through this incision. There are no abdominal incisions.  · Conventional laparoscopic hysterectomy. A thin, lighted tube with a camera (laparoscope) is inserted into 3 or 4 small incisions in the abdomen. The uterus is cut into small pieces. The small pieces are removed through the incisions, or they are removed through the vagina.  · Laparoscopic assisted vaginal hysterectomy (LAVH). Three or four small incisions are made in the abdomen. Part of the surgery is performed laparoscopically and part vaginally. The uterus is removed through the vagina.  · Robot-assisted laparoscopic hysterectomy. A laparoscope is inserted into 3 or 4 small  incisions in the abdomen. A computer-controlled device is used to give the surgeon a 3D image. This allows for more precise movements of surgical instruments. The uterus is cut into small pieces and removed through the incisions or removed through the vagina.  RISKS OF HYSTERECTOMY    · Bleeding and risk of blood transfusion. Tell your caregiver if you do not want to receive any blood products.  · Blood clots in the legs or lung.  · Infection.  · Injury to surrounding organs.  · Anesthesia problems or side effects.  · Conversion to an abdominal hysterectomy.  WHAT TO EXPECT AFTER A HYSTERECTOMY  · You will be given pain medicine.  · You will need to have someone with you for the first 3 to 5 days after you go home.  · You will need to follow up with your surgeon in 2 to 4 weeks after surgery to evaluate your progress.  · You may have early menopause symptoms like hot flashes, night sweats, and insomnia.  · If you had a hysterectomy for a problem that was not a cancer or a condition that could lead to cancer, then you no longer need Pap tests. However, even if you no longer need a Pap test, a regular exam is a good idea to make sure no other problems are starting.  Document Released: 04/15/2001 Document Revised: 01/12/2012 Document Reviewed: 05/31/2011  [employer]® Patient Information ©2013 [employer].

## 2012-10-05 ENCOUNTER — Encounter: Payer: Self-pay | Admitting: Family Medicine

## 2012-10-05 NOTE — Progress Notes (Signed)
Subjective:    Patient ID: Tammy Blanchard, female    DOB: [birth year], 46 y.o.   MRN: [id number]  HPI 46 y.o. female here in follow up for abdominal pain and fibroid uterus. Last visit she was complaining of sharp pains/pressure and pulling sensations in her abdomen. She states that the pain has resolved now following her pelvic ultrasound. She thinks that drinking the water needed for the [place] alleviated her constipation and she has been having good BMs since then.  Her US showed a significantly enlarged uterus with multiple large fibroids all with increased size from prior ultrasound in July of 2012. Again, the endometrium was not visualized. The patient has not had any vaginal bleeding since her last visit. She has irregular periods, every 2-3 months, very light.   Review of Systems  Constitutional: Negative for fever, chills, appetite change, fatigue and unexpected weight change.  Respiratory: Negative for shortness of breath.   Cardiovascular: Negative for chest pain.  Gastrointestinal: Negative for nausea, vomiting, diarrhea and blood in stool.  Genitourinary: Positive for urgency. Negative for dysuria, frequency, vaginal bleeding, vaginal discharge and difficulty urinating.  Neurological: Negative for light-headedness.       Objective:   Physical Exam  Constitutional: She is oriented to person, place, and time. She appears well-developed and well-nourished. No distress.  HENT:  Head: Normocephalic and atraumatic.  Eyes: Conjunctivae normal and EOM are normal.  Neck: Normal range of motion. Neck supple.  Cardiovascular: Normal rate.   Pulmonary/Chest: Effort normal. No respiratory distress.  Abdominal: There is no rebound and no guarding.       Uterus enlarged above umbilicus (23-25 weeks), occupies most of abdomen. Large fibroid felt right/posterior, some tenderness.  Genitourinary:       Normal external genitalia. Cervix very low in vagina. No CMT. Fibroids palpable in lower  segment of uterus.  Musculoskeletal: Normal range of motion. She exhibits no edema and no tenderness.  Neurological: She is alert and oriented to person, place, and time.  Skin: Skin is warm and dry.  Psychiatric: She has a normal mood and affect.    Pelvic/TV Ultrasound 09/10/12: Comparison: Ultrasound of the pelvis on 05/08/2011  Findings:  Uterus: The uterus is enlarged, 21.1 x 11.1 x 15.8 cm. Multiple  fibroids are identified.  Left fundal fibroid is 7.8 x 7.1 x 7.5 cm.  Left mid uterine fibroid is 6.6 x 5.8 x 5.6 cm.  Posterior fibroid is 5.8 x 5.2 x 6.6 cm.  Lower uterine segment fibroid is 6.0 x 4.6 x 6.1 cm.  Endometrium: The endometrium is not visualized secondary to  multiple fibroids.  Right ovary: 5.8 x 4.2 x 4.5 cm. 2 small solid hyperechoic areas  are identified, measuring 1.9 x 1.5 x 1.8 cm and 2.5 x 2.1 x 1.5  cm. A simple cyst is 3.0 x 2.5 x 3.0 cm.  Left ovary: 5.3 x 3.8 x 3.4 cm. A simple cyst is 3.2 x 3.2 x 3.1  cm.  Other Findings: No free fluid  IMPRESSION:  1. Multiple uterine fibroids. Largest fibroids have increased in  size.  2. Non-visualized endometrium.  3. Small hyperechoic areas within the right ovary likely  representing involuting follicles. Follow-up ultrasound in 3-4  months is recommended.  Original Report Authenticated By: Norva Pavlov, M.D.     Assessment & Plan:  46 y.o. female with large fibroid uterus. No bleeding issues and pain resolved. General abdominal fullness/discomfort.  Dr. Erin Fulling was present for bimanual pelvic exam and spent 20 minutes  with patient discussing treatment options including uterine artery embolization and hysterectomy techniques. Given dimensions of uterus, Dr. Erin Fulling explained to patient that robotic or laparoscopic hysterectomy was not feasible and would have added risks. She also explained that uterine artery embolization would have increased likelihood of pain from involution of large fibroids  and less chance of significantly improved uterine size due to the size of patient's uterus. The patient was encouraged to consider options and obtain a second opinion if desired. She expressed her desire for definitive treatment with hysterectomy and would like to proceed with scheduling an open, total abdominal hysterectomy.  Message send to Admin. Patient will need pre-op visit with Dr. Erin Fulling to discuss risks and surgical procedure.   Napoleon Form, MD

## 2012-11-11 ENCOUNTER — Encounter (HOSPITAL_COMMUNITY): Payer: Self-pay | Admitting: Pharmacist

## 2012-11-15 ENCOUNTER — Ambulatory Visit (INDEPENDENT_AMBULATORY_CARE_PROVIDER_SITE_OTHER): Payer: 59 | Admitting: Obstetrics & Gynecology

## 2012-11-15 ENCOUNTER — Encounter: Payer: Self-pay | Admitting: Obstetrics & Gynecology

## 2012-11-15 VITALS — BP 135/96 | HR 91 | Ht 61.0 in | Wt 164.6 lb

## 2012-11-15 DIAGNOSIS — D259 Leiomyoma of uterus, unspecified: Secondary | ICD-10-CM

## 2012-11-15 DIAGNOSIS — D219 Benign neoplasm of connective and other soft tissue, unspecified: Secondary | ICD-10-CM

## 2012-11-15 NOTE — Progress Notes (Signed)
Patient ID: Tammy Blanchard, female   DOB: 08-Feb-1966, 47 y.o.   MRN: [id number] Patient desires surgical management with TAH/BSO.  The risks of surgery were discussed in detail with the patient including but not limited to: bleeding which may require transfusion or reoperation; infection which may require prolonged hospitalization or re-hospitalization and antibiotic therapy; injury to bowel, bladder, ureters and major vessels or other surrounding organs; need for additional procedures including laparotomy; thromboembolic phenomenon, incisional problems and other postoperative or anesthesia complications.  Patient was told that the likelihood that her condition and symptoms will be treated effectively with this surgical management was very high; the postoperative expectations were also discussed in detail. The patient also understands the alternative treatment options which were discussed in full. All questions were answered.  She was told that she will be contacted by our surgical scheduler regarding the time and date of her surgery; routine preoperative instructions of having nothing to eat or drink after midnight on the day prior to surgery and also coming to the hospital 1 1/2 hours prior to her time of surgery were also emphasized.  She was told she may be called for a preoperative appointment about a week prior to surgery and will be given further preoperative instructions at that visit. Printed patient education handouts about the procedure were given to the patient to review at home.  I have also reviewed with the patient removal of her ovaries due to her h/o breast cancer.  I have reviewed with her that she will NOT be a candidate for ERT for vasomotor sx but, that alternative treatments would be needed if those sx become a problem.  I have STRONGLY encouraged tobacco cessation.   All of her questions were answered to her satisfaction.  I spent with pt in face to face discussion.

## 2012-11-15 NOTE — Patient Instructions (Signed)
Hysterectomy Care After Refer to this sheet in the next few weeks. These instructions provide you with information on caring for yourself after your procedure. Your caregiver may also give you more specific instructions. Your treatment has been planned according to current medical practices, but problems sometimes occur. Call your caregiver if you have any problems or questions after your procedure. HOME CARE INSTRUCTIONS  Healing will take time. You may have discomfort, tenderness, swelling, and bruising at the surgical site for about 2 weeks. This is normal and will get better as time goes on.  Only take over-the-counter or prescription medicines for pain, discomfort, or fever as directed by your caregiver.  Do not take aspirin. It can cause bleeding.  Do not drive when taking pain medicine.  Follow your caregiver's advice regarding exercise, lifting, driving, and general activities.  Resume your usual diet as directed and allowed.  Get plenty of rest and sleep.  Do not douche, use tampons, or have sexual intercourse for at least 6 weeks or until your caregiver gives you permission.  Change your bandages (dressings) as directed by your caregiver.  Monitor your temperature.  Take showers instead of baths for 2 to 3 weeks.  Do not drink alcohol until your caregiver gives you permission.  If you are constipated, you may take a mild laxative with your caregiver's permission. Bran foods may help with constipation problems. Drinking enough fluids to keep your urine clear or pale yellow may help as well.  Try to have someone home with you for 1 or 2 weeks to help around the house.  Keep all of your follow-up appointments as directed by your caregiver. SEEK MEDICAL CARE IF:   You have swelling, redness, or increasing pain in the surgical cut (incision) area.  You have pus coming from the incision.  You notice a bad smell coming from the incision or dressing.  You have swelling,  redness, or pain around the intravenous (IV) site.  Your incision breaks open.  You feel dizzy or lightheaded.  You have pain or bleeding when you urinate.  You have persistent diarrhea.  You have persistent nausea and vomiting.  You have abnormal vaginal discharge.  You have a rash.  You have any type of abnormal reaction or develop an allergy to your medicine.  Your pain is not controlled with your prescribed medicine. SEEK IMMEDIATE MEDICAL CARE IF:   You have a fever.  You have severe abdominal pain.  You have chest pain.  You have shortness of breath.  You faint.  You have pain, swelling, or redness of your leg.  You have heavy vaginal bleeding with blood clots. MAKE SURE YOU:  Understand these instructions.  Will watch your condition.  Will get help right away if you are not doing well or get worse. Document Released: 05/09/2005 Document Revised: 01/12/2012 Document Reviewed: 06/06/2011 ExitCare Patient Information 2013 [employer]. Hysterectomy Information  A hysterectomy is a procedure where your uterus is surgically removed. It will no longer be possible to have menstrual periods or to become pregnant. The tubes and ovaries can be removed (bilateral salpingo-oopherectomy) during this surgery as well.  REASONS FOR A HYSTERECTOMY  Persistent, abnormal bleeding.  Lasting (chronic) pelvic pain or infection.  The lining of the uterus (endometrium) starts growing outside the uterus (endometriosis).  The endometrium starts growing in the muscle of the uterus (adenomyosis).  The uterus falls down into the vagina (pelvic organ prolapse).  Symptomatic uterine fibroids.  Precancerous cells.  Cervical cancer or   uterine cancer. TYPES OF HYSTERECTOMIES  Supracervical hysterectomy. This type removes the top part of the uterus, but not the cervix.  Total hysterectomy. This type removes the uterus and cervix.  Radical hysterectomy. This type removes the  uterus, cervix, and the fibrous tissue that holds the uterus in place in the pelvis (parametrium). WAYS A HYSTERECTOMY CAN BE PERFORMED  Abdominal hysterectomy. A large surgical cut (incision) is made in the abdomen. The uterus is removed through this incision.  Vaginal hysterectomy. An incision is made in the vagina. The uterus is removed through this incision. There are no abdominal incisions.  Conventional laparoscopic hysterectomy. A thin, lighted tube with a camera (laparoscope) is inserted into 3 or 4 small incisions in the abdomen. The uterus is cut into small pieces. The small pieces are removed through the incisions, or they are removed through the vagina.  Laparoscopic assisted vaginal hysterectomy (LAVH). Three or four small incisions are made in the abdomen. Part of the surgery is performed laparoscopically and part vaginally. The uterus is removed through the vagina.  Robot-assisted laparoscopic hysterectomy. A laparoscope is inserted into 3 or 4 small incisions in the abdomen. A computer-controlled device is used to give the surgeon a 3D image. This allows for more precise movements of surgical instruments. The uterus is cut into small pieces and removed through the incisions or removed through the vagina. RISKS OF HYSTERECTOMY   Bleeding and risk of blood transfusion. Tell your caregiver if you do not want to receive any blood products.  Blood clots in the legs or lung.  Infection.  Injury to surrounding organs.  Anesthesia problems or side effects.  Conversion to an abdominal hysterectomy. WHAT TO EXPECT AFTER A HYSTERECTOMY  You will be given pain medicine.  You will need to have someone with you for the first 3 to 5 days after you go home.  You will need to follow up with your surgeon in 2 to 4 weeks after surgery to evaluate your progress.  You may have early menopause symptoms like hot flashes, night sweats, and insomnia.  If you had a hysterectomy for a problem  that was not a cancer or a condition that could lead to cancer, then you no longer need Pap tests. However, even if you no longer need a Pap test, a regular exam is a good idea to make sure no other problems are starting. Document Released: 04/15/2001 Document Revised: 01/12/2012 Document Reviewed: 05/31/2011 ExitCare Patient Information 2013 [employer].  

## 2012-11-23 ENCOUNTER — Encounter (HOSPITAL_COMMUNITY)
Admission: RE | Admit: 2012-11-23 | Discharge: 2012-11-23 | Disposition: A | Discharge status: Home / Self Care | Payer: 59 | Source: Ambulatory Visit | Attending: Obstetrics & Gynecology | Admitting: Obstetrics & Gynecology

## 2012-11-23 ENCOUNTER — Other Ambulatory Visit: Payer: Self-pay

## 2012-11-23 ENCOUNTER — Encounter (HOSPITAL_COMMUNITY): Payer: Self-pay

## 2012-11-23 HISTORY — DX: Personal history of other medical treatment: Z92.89

## 2012-11-23 LAB — BASIC METABOLIC PANEL
CO2: 25 mEq/L (ref 19–32)
Calcium: 9.8 mg/dL (ref 8.4–10.5)
Creatinine, Ser: 0.46 mg/dL — ABNORMAL LOW (ref 0.50–1.10)
GFR calc non Af Amer: >90 mL/min (ref >90)
Glucose, Bld: 89 mg/dL (ref 70–99)

## 2012-11-23 LAB — CBC
MCH: 29.6 pg (ref 26.0–34.0)
MCHC: 33.3 g/dL (ref 30.0–36.0)
MCV: 88.7 fL (ref 78.0–100.0)
Platelets: 290 10*3/uL (ref 150–400)
RDW: 14 % (ref 11.5–15.5)

## 2012-11-23 LAB — SURGICAL PCR SCREEN
MRSA, PCR: INVALID — AB
Staphylococcus aureus: INVALID — AB

## 2012-11-23 NOTE — Patient Instructions (Addendum)
   Your procedure is scheduled on:  Thursday, Jan 23  Enter through the Main Entrance of [facility] at: 10 am Pick up the phone at the desk and dial [phone] and inform us of your arrival.  Please call this number if you have any problems the morning of surgery: [phone]  Remember: Do not eat food after midnight: Wednesday Do not drink clear liquids after: midnight Wednesday Take these medicines the morning of surgery with a SIP OF WATER: NORVASC, COZAAR, PRILOSEC, TAMOXIFEN  Do not wear jewelry, make-up, or FINGER nail polish No metal in your hair or on your body. Do not wear lotions, powders, perfumes. You may wear deodorant.  Please use your CHG wash as directed prior to surgery.  Do not shave anywhere for at least 12 hours prior to first CHG shower.  Do not bring valuables to the hospital. Contacts, dentures or bridgework may not be worn into surgery.  Leave suitcase in the car. After Surgery it may be brought to your room. For patients being admitted to the hospital, checkout time is 11:00am the day of discharge.  HOME WITH Tammy Blanchard.

## 2012-11-23 NOTE — Pre-Procedure Instructions (Signed)
Dr Arby Barrette reviewed patient's history, meds, ekg and bp reading - ok to see DOS.

## 2012-11-24 ENCOUNTER — Encounter: Payer: Self-pay | Admitting: *Deleted

## 2012-11-24 MED ORDER — CEFAZOLIN SODIUM-DEXTROSE 2-3 GM-% IV SOLR
2.0000 g | INTRAVENOUS | Status: AC
  Administered 2012-11-25: 2 g via INTRAVENOUS

## 2012-11-24 NOTE — Progress Notes (Signed)
Received call from patient stating she is having a hysterectomy and ovaries removed tomorrow and is wandering about taking her tamoxifen. Instructed her per Zollie Scale, PA to stop tamoxifen and to stay off until she sees Vikki Ports in March.  Pt. Verbalized understanding.

## 2012-11-25 ENCOUNTER — Encounter (HOSPITAL_COMMUNITY)
Admission: RE | Disposition: A | Discharge status: Home / Self Care | Payer: Self-pay | Source: Ambulatory Visit | Attending: Obstetrics & Gynecology

## 2012-11-25 ENCOUNTER — Inpatient Hospital Stay (HOSPITAL_COMMUNITY)
Admission: RE | Admit: 2012-11-25 | Discharge: 2012-11-28 | DRG: 743 | Disposition: A | Discharge status: Home / Self Care | Payer: 59 | Source: Ambulatory Visit | Attending: Obstetrics & Gynecology | Admitting: Obstetrics & Gynecology

## 2012-11-25 ENCOUNTER — Inpatient Hospital Stay (HOSPITAL_COMMUNITY): Payer: 59 | Admitting: Anesthesiology

## 2012-11-25 ENCOUNTER — Encounter (HOSPITAL_COMMUNITY): Payer: Self-pay | Admitting: *Deleted

## 2012-11-25 ENCOUNTER — Encounter (HOSPITAL_COMMUNITY): Payer: Self-pay | Admitting: Anesthesiology

## 2012-11-25 DIAGNOSIS — N852 Hypertrophy of uterus: Secondary | ICD-10-CM | POA: Diagnosis present

## 2012-11-25 DIAGNOSIS — D259 Leiomyoma of uterus, unspecified: Secondary | ICD-10-CM

## 2012-11-25 DIAGNOSIS — N83209 Unspecified ovarian cyst, unspecified side: Secondary | ICD-10-CM | POA: Diagnosis present

## 2012-11-25 DIAGNOSIS — D649 Anemia, unspecified: Secondary | ICD-10-CM | POA: Diagnosis present

## 2012-11-25 DIAGNOSIS — N99518 Other cystostomy complication: Secondary | ICD-10-CM

## 2012-11-25 DIAGNOSIS — D252 Subserosal leiomyoma of uterus: Secondary | ICD-10-CM | POA: Diagnosis present

## 2012-11-25 DIAGNOSIS — D250 Submucous leiomyoma of uterus: Secondary | ICD-10-CM | POA: Diagnosis present

## 2012-11-25 DIAGNOSIS — I10 Essential (primary) hypertension: Secondary | ICD-10-CM | POA: Diagnosis present

## 2012-11-25 DIAGNOSIS — N920 Excessive and frequent menstruation with regular cycle: Principal | ICD-10-CM | POA: Diagnosis present

## 2012-11-25 DIAGNOSIS — D509 Iron deficiency anemia, unspecified: Secondary | ICD-10-CM

## 2012-11-25 HISTORY — PX: ABDOMINAL HYSTERECTOMY: SHX81

## 2012-11-25 LAB — ABO/RH: ABO/RH(D): O POS

## 2012-11-25 SURGERY — HYSTERECTOMY, ABDOMINAL
Anesthesia: General | Site: Abdomen | Wound class: Clean Contaminated

## 2012-11-25 MED ORDER — 0.9 % SODIUM CHLORIDE (POUR BTL) OPTIME
TOPICAL | Status: DC | PRN
  Administered 2012-11-25 (×2): 1000 mL

## 2012-11-25 MED ORDER — ZOLPIDEM TARTRATE 5 MG PO TABS: 5.0000 mg | ORAL_TABLET | Freq: Every evening | ORAL | Status: DC | PRN

## 2012-11-25 MED ORDER — LIDOCAINE HCL (CARDIAC) 20 MG/ML IV SOLN
INTRAVENOUS | Status: AC
  Filled 2012-11-25: qty 5

## 2012-11-25 MED ORDER — LACTATED RINGERS IV SOLN
INTRAVENOUS | Status: DC
  Administered 2012-11-25: 11:00:00 via INTRAVENOUS

## 2012-11-25 MED ORDER — METHYLENE BLUE 1 % INJ SOLN
INTRAMUSCULAR | Status: AC
  Filled 2012-11-25: qty 1

## 2012-11-25 MED ORDER — ROCURONIUM BROMIDE 100 MG/10ML IV SOLN
INTRAVENOUS | Status: DC | PRN
  Administered 2012-11-25: 40 mg via INTRAVENOUS
  Administered 2012-11-25: 10 mg via INTRAVENOUS

## 2012-11-25 MED ORDER — ONDANSETRON HCL 4 MG PO TABS: 4.0000 mg | ORAL_TABLET | Freq: Four times a day (QID) | ORAL | Status: DC | PRN

## 2012-11-25 MED ORDER — MENTHOL 3 MG MT LOZG: 1.0000 | LOZENGE | OROMUCOSAL | Status: DC | PRN

## 2012-11-25 MED ORDER — BUPIVACAINE HCL (PF) 0.25 % IJ SOLN
INTRAMUSCULAR | Status: DC | PRN
  Administered 2012-11-25: 20 mL

## 2012-11-25 MED ORDER — SODIUM CHLORIDE 0.9 % IJ SOLN: 9.0000 mL | INTRAMUSCULAR | Status: DC | PRN

## 2012-11-25 MED ORDER — FENTANYL CITRATE 0.05 MG/ML IJ SOLN
INTRAMUSCULAR | Status: AC
  Filled 2012-11-25: qty 5

## 2012-11-25 MED ORDER — DEXTROSE-NACL 5-0.45 % IV SOLN
INTRAVENOUS | Status: DC
  Administered 2012-11-25 – 2012-11-26 (×2): via INTRAVENOUS

## 2012-11-25 MED ORDER — PANTOPRAZOLE SODIUM 40 MG PO TBEC
40.0000 mg | DELAYED_RELEASE_TABLET | Freq: Every day | ORAL | Status: DC
  Administered 2012-11-27: 40 mg via ORAL
  Filled 2012-11-25 (×4): qty 1

## 2012-11-25 MED ORDER — HYDROMORPHONE HCL PF 1 MG/ML IJ SOLN
INTRAMUSCULAR | Status: AC
  Filled 2012-11-25: qty 1

## 2012-11-25 MED ORDER — STERILE WATER FOR IRRIGATION IR SOLN
Status: DC | PRN
  Administered 2012-11-25: 2000 mL via INTRAVESICAL

## 2012-11-25 MED ORDER — PROMETHAZINE HCL 25 MG/ML IJ SOLN: 6.2500 mg | INTRAMUSCULAR | Status: DC | PRN

## 2012-11-25 MED ORDER — KETOROLAC TROMETHAMINE 30 MG/ML IJ SOLN
15.0000 mg | Freq: Once | INTRAMUSCULAR | Status: AC | PRN
  Administered 2012-11-25: 30 mg via INTRAVENOUS

## 2012-11-25 MED ORDER — HYDROMORPHONE HCL PF 1 MG/ML IJ SOLN
INTRAMUSCULAR | Status: DC | PRN
  Administered 2012-11-25: 1 mg via INTRAVENOUS
  Administered 2012-11-25 (×2): .5 mg via INTRAVENOUS

## 2012-11-25 MED ORDER — OXYCODONE-ACETAMINOPHEN 5-325 MG PO TABS
1.0000 | ORAL_TABLET | ORAL | Status: DC | PRN
  Administered 2012-11-26 – 2012-11-28 (×5): 2 via ORAL
  Filled 2012-11-25 (×5): qty 2

## 2012-11-25 MED ORDER — PHENYLEPHRINE 40 MCG/ML (10ML) SYRINGE FOR IV PUSH (FOR BLOOD PRESSURE SUPPORT)
PREFILLED_SYRINGE | INTRAVENOUS | Status: AC
  Filled 2012-11-25: qty 10

## 2012-11-25 MED ORDER — PANTOPRAZOLE SODIUM 40 MG PO TBEC: 40.0000 mg | DELAYED_RELEASE_TABLET | Freq: Every day | ORAL | Status: DC

## 2012-11-25 MED ORDER — ONDANSETRON HCL 4 MG/2ML IJ SOLN: 4.0000 mg | Freq: Four times a day (QID) | INTRAMUSCULAR | Status: DC | PRN

## 2012-11-25 MED ORDER — NEOSTIGMINE METHYLSULFATE 1 MG/ML IJ SOLN
INTRAMUSCULAR | Status: DC | PRN
  Administered 2012-11-25: 3 mg via INTRAVENOUS

## 2012-11-25 MED ORDER — LACTATED RINGERS IV SOLN
INTRAVENOUS | Status: DC
  Administered 2012-11-25 (×4): via INTRAVENOUS

## 2012-11-25 MED ORDER — MIDAZOLAM HCL 5 MG/5ML IJ SOLN
INTRAMUSCULAR | Status: DC | PRN
  Administered 2012-11-25: 2 mg via INTRAVENOUS

## 2012-11-25 MED ORDER — NALOXONE HCL 0.4 MG/ML IJ SOLN: 0.4000 mg | INTRAMUSCULAR | Status: DC | PRN

## 2012-11-25 MED ORDER — PHENYLEPHRINE 40 MCG/ML (10ML) SYRINGE FOR IV PUSH (FOR BLOOD PRESSURE SUPPORT)
PREFILLED_SYRINGE | INTRAVENOUS | Status: AC
  Filled 2012-11-25: qty 5

## 2012-11-25 MED ORDER — PHENYLEPHRINE HCL 10 MG/ML IJ SOLN
INTRAMUSCULAR | Status: DC | PRN
  Administered 2012-11-25: 80 ug via INTRAVENOUS
  Administered 2012-11-25: 40 ug via INTRAVENOUS
  Administered 2012-11-25: 80 ug via INTRAVENOUS
  Administered 2012-11-25 (×5): 40 ug via INTRAVENOUS

## 2012-11-25 MED ORDER — KETOROLAC TROMETHAMINE 30 MG/ML IJ SOLN
INTRAMUSCULAR | Status: AC
  Administered 2012-11-25: 30 mg via INTRAVENOUS
  Filled 2012-11-25: qty 1

## 2012-11-25 MED ORDER — HYDROMORPHONE HCL PF 1 MG/ML IJ SOLN: 0.2500 mg | INTRAMUSCULAR | Status: DC | PRN

## 2012-11-25 MED ORDER — GLYCOPYRROLATE 0.2 MG/ML IJ SOLN
INTRAMUSCULAR | Status: DC | PRN
  Administered 2012-11-25: 0.4 mg via INTRAVENOUS

## 2012-11-25 MED ORDER — LOSARTAN POTASSIUM 50 MG PO TABS
100.0000 mg | ORAL_TABLET | Freq: Every day | ORAL | Status: DC
  Administered 2012-11-26 – 2012-11-27 (×2): 100 mg via ORAL
  Filled 2012-11-25 (×3): qty 2

## 2012-11-25 MED ORDER — MIDAZOLAM HCL 2 MG/2ML IJ SOLN
INTRAMUSCULAR | Status: AC
  Filled 2012-11-25: qty 2

## 2012-11-25 MED ORDER — MORPHINE SULFATE (PF) 1 MG/ML IV SOLN
INTRAVENOUS | Status: DC
  Administered 2012-11-25 (×2): via INTRAVENOUS
  Administered 2012-11-25: 21 mg via INTRAVENOUS
  Administered 2012-11-26: 9 mg via INTRAVENOUS
  Administered 2012-11-26: 11.33 mg via INTRAVENOUS
  Filled 2012-11-25 (×2): qty 25

## 2012-11-25 MED ORDER — KETOROLAC TROMETHAMINE 30 MG/ML IJ SOLN
30.0000 mg | Freq: Three times a day (TID) | INTRAMUSCULAR | Status: AC
  Administered 2012-11-25 – 2012-11-26 (×3): 30 mg via INTRAVENOUS
  Filled 2012-11-25 (×3): qty 1

## 2012-11-25 MED ORDER — PROPOFOL 10 MG/ML IV EMUL
INTRAVENOUS | Status: AC
  Filled 2012-11-25: qty 20

## 2012-11-25 MED ORDER — MEPERIDINE HCL 25 MG/ML IJ SOLN: 6.2500 mg | INTRAMUSCULAR | Status: DC | PRN

## 2012-11-25 MED ORDER — PROPOFOL 10 MG/ML IV EMUL
INTRAVENOUS | Status: DC | PRN
  Administered 2012-11-25: 50 mg via INTRAVENOUS
  Administered 2012-11-25: 60 mg via INTRAVENOUS
  Administered 2012-11-25: 200 mg via INTRAVENOUS

## 2012-11-25 MED ORDER — FENTANYL CITRATE 0.05 MG/ML IJ SOLN
INTRAMUSCULAR | Status: DC | PRN
  Administered 2012-11-25: 50 ug via INTRAVENOUS
  Administered 2012-11-25 (×2): 100 ug via INTRAVENOUS

## 2012-11-25 MED ORDER — DIPHENHYDRAMINE HCL 50 MG/ML IJ SOLN: 12.5000 mg | Freq: Four times a day (QID) | INTRAMUSCULAR | Status: DC | PRN

## 2012-11-25 MED ORDER — ROCURONIUM BROMIDE 50 MG/5ML IV SOLN
INTRAVENOUS | Status: AC
  Filled 2012-11-25: qty 1

## 2012-11-25 MED ORDER — INDIGOTINDISULFONATE SODIUM 8 MG/ML IJ SOLN
INTRAMUSCULAR | Status: AC
  Filled 2012-11-25: qty 5

## 2012-11-25 MED ORDER — BUPIVACAINE HCL (PF) 0.25 % IJ SOLN
INTRAMUSCULAR | Status: AC
  Filled 2012-11-25: qty 30

## 2012-11-25 MED ORDER — ONDANSETRON HCL 4 MG/2ML IJ SOLN
4.0000 mg | Freq: Four times a day (QID) | INTRAMUSCULAR | Status: DC | PRN
  Administered 2012-11-26: 4 mg via INTRAVENOUS
  Filled 2012-11-25: qty 2

## 2012-11-25 MED ORDER — PANTOPRAZOLE SODIUM 40 MG PO TBEC
DELAYED_RELEASE_TABLET | ORAL | Status: AC
  Filled 2012-11-25: qty 1

## 2012-11-25 MED ORDER — LIDOCAINE HCL (CARDIAC) 20 MG/ML IV SOLN
INTRAVENOUS | Status: DC | PRN
  Administered 2012-11-25: 80 mg via INTRAVENOUS

## 2012-11-25 MED ORDER — ENOXAPARIN SODIUM 40 MG/0.4ML ~~LOC~~ SOLN
40.0000 mg | SUBCUTANEOUS | Status: DC
  Administered 2012-11-26 – 2012-11-27 (×2): 40 mg via SUBCUTANEOUS
  Filled 2012-11-25 (×3): qty 0.4

## 2012-11-25 MED ORDER — CEFAZOLIN SODIUM-DEXTROSE 2-3 GM-% IV SOLR
INTRAVENOUS | Status: AC
  Filled 2012-11-25: qty 50

## 2012-11-25 MED ORDER — CLONIDINE HCL 0.2 MG PO TABS
0.2000 mg | ORAL_TABLET | Freq: Every day | ORAL | Status: DC
  Administered 2012-11-26 – 2012-11-27 (×2): 0.2 mg via ORAL
  Filled 2012-11-25 (×3): qty 1

## 2012-11-25 MED ORDER — AMLODIPINE BESYLATE 10 MG PO TABS
10.0000 mg | ORAL_TABLET | Freq: Every day | ORAL | Status: DC
  Administered 2012-11-26 – 2012-11-27 (×2): 10 mg via ORAL
  Filled 2012-11-25 (×3): qty 1

## 2012-11-25 MED ORDER — ONDANSETRON HCL 4 MG/2ML IJ SOLN
INTRAMUSCULAR | Status: AC
  Filled 2012-11-25: qty 2

## 2012-11-25 MED ORDER — INDIGOTINDISULFONATE SODIUM 8 MG/ML IJ SOLN
INTRAMUSCULAR | Status: DC | PRN
  Administered 2012-11-25: 4 mL via INTRAVENOUS

## 2012-11-25 MED ORDER — DIPHENHYDRAMINE HCL 12.5 MG/5ML PO ELIX: 12.5000 mg | ORAL_SOLUTION | Freq: Four times a day (QID) | ORAL | Status: DC | PRN

## 2012-11-25 MED ORDER — GLYCOPYRROLATE 0.2 MG/ML IJ SOLN
INTRAMUSCULAR | Status: AC
  Filled 2012-11-25: qty 1

## 2012-11-25 SURGICAL SUPPLY — 49 items
BARRIER ADHS 3X4 INTERCEED (GAUZE/BANDAGES/DRESSINGS) IMPLANT
BENZOIN TINCTURE PRP APPL 2/3 (GAUZE/BANDAGES/DRESSINGS) IMPLANT
CANISTER SUCTION 2500CC (MISCELLANEOUS) ×6 IMPLANT
CELLS DAT CNTRL 66122 CELL SVR (MISCELLANEOUS) IMPLANT
CHLORAPREP W/TINT 26ML (MISCELLANEOUS) ×6 IMPLANT
CLOTH BEACON ORANGE TIMEOUT ST (SAFETY) ×3 IMPLANT
CONT PATH 16OZ SNAP LID 3702 (MISCELLANEOUS) ×3 IMPLANT
DECANTER SPIKE VIAL GLASS SM (MISCELLANEOUS) ×3 IMPLANT
DRESSING TELFA 8X3 (GAUZE/BANDAGES/DRESSINGS) ×3 IMPLANT
DRSG COVADERM 4X10 (GAUZE/BANDAGES/DRESSINGS) ×3 IMPLANT
GAUZE SPONGE 4X4 16PLY XRAY LF (GAUZE/BANDAGES/DRESSINGS) ×3 IMPLANT
GLOVE BIO SURGEON STRL SZ7 (GLOVE) ×9 IMPLANT
GLOVE BIOGEL PI IND STRL 6.5 (GLOVE) ×4 IMPLANT
GLOVE BIOGEL PI IND STRL 7.0 (GLOVE) ×8 IMPLANT
GLOVE BIOGEL PI INDICATOR 6.5 (GLOVE) ×2
GLOVE BIOGEL PI INDICATOR 7.0 (GLOVE) ×4
GLOVE ECLIPSE 6.0 STRL STRAW (GLOVE) ×6 IMPLANT
GLOVE SURG SS PI 7.0 STRL IVOR (GLOVE) ×3 IMPLANT
GOWN PREVENTION PLUS LG XLONG (DISPOSABLE) ×3 IMPLANT
GOWN PREVENTION PLUS XLARGE (GOWN DISPOSABLE) ×12 IMPLANT
NEEDLE HYPO 25X1 1.5 SAFETY (NEEDLE) ×3 IMPLANT
NS IRRIG 1000ML POUR BTL (IV SOLUTION) ×6 IMPLANT
PACK ABDOMINAL GYN (CUSTOM PROCEDURE TRAY) ×3 IMPLANT
PAD ABD 7.5X8 STRL (GAUZE/BANDAGES/DRESSINGS) ×6 IMPLANT
PAD OB MATERNITY 4.3X12.25 (PERSONAL CARE ITEMS) ×3 IMPLANT
PROTECTOR NERVE ULNAR (MISCELLANEOUS) ×3 IMPLANT
RETRACTOR WND ALEXIS 25 LRG (MISCELLANEOUS) IMPLANT
RTRCTR WOUND ALEXIS 18CM MED (MISCELLANEOUS)
RTRCTR WOUND ALEXIS 25CM LRG (MISCELLANEOUS)
SET CYSTO W/LG BORE CLAMP LF (SET/KITS/TRAYS/PACK) ×3 IMPLANT
SPONGE GAUZE 4X4 12PLY (GAUZE/BANDAGES/DRESSINGS) ×3 IMPLANT
SPONGE LAP 18X18 X RAY DECT (DISPOSABLE) ×18 IMPLANT
STAPLER VISISTAT 35W (STAPLE) ×3 IMPLANT
STRIP CLOSURE SKIN 1/2X4 (GAUZE/BANDAGES/DRESSINGS) IMPLANT
SUT CHROMIC 3 0 SH 27 (SUTURE) ×3 IMPLANT
SUT VIC AB 0 CT1 18XCR BRD8 (SUTURE) ×10 IMPLANT
SUT VIC AB 0 CT1 27 (SUTURE) ×4
SUT VIC AB 0 CT1 27XBRD ANBCTR (SUTURE) ×8 IMPLANT
SUT VIC AB 0 CT1 8-18 (SUTURE) ×5
SUT VIC AB 1 CTX 36 (SUTURE) ×2
SUT VIC AB 1 CTX36XBRD ANBCTRL (SUTURE) ×4 IMPLANT
SUT VIC AB 3-0 SH 27 (SUTURE)
SUT VIC AB 3-0 SH 27X BRD (SUTURE) IMPLANT
SUT VIC AB 4-0 KS 27 (SUTURE) IMPLANT
SUT VICRYL 0 TIES 12 18 (SUTURE) ×3 IMPLANT
SYR CONTROL 10ML LL (SYRINGE) ×3 IMPLANT
TOWEL OR 17X24 6PK STRL BLUE (TOWEL DISPOSABLE) ×6 IMPLANT
TRAY FOLEY CATH 14FR (SET/KITS/TRAYS/PACK) ×6 IMPLANT
WATER STERILE IRR 1000ML POUR (IV SOLUTION) ×3 IMPLANT

## 2012-11-25 NOTE — OR Nursing (Signed)
At 1325 in OR room 4 pt was positioned into stirrups bilaterally in lithotomy position with arms extended on armboard's.  Positioned by Ginger Fountain RN, Rutherford Guys RN, and Transport planner.  This position was used for cysto procedure.

## 2012-11-25 NOTE — H&P (Signed)
Tammy Blanchard is an 47 y.o. female. Patient presents for operative management of symptomatic uterine fibroids for menorrhagia and pain.  Pertinent Gynecological History: Menses: regular every 30-45 days without intermenstrual spotting Bleeding: dysfunctional uterine bleeding Contraception: tubal ligation DES exposure: denies Blood transfusions: x1episonde in 2009 Sexually transmitted diseases: no past history Previous GYN Procedures: sterilization  Last mammogram: normal Date: 07/2012 Last pap: normal Date: 2012 (on the 3 year plan) OB History: G6, P4024   Menstrual History: Menarche age: 89 Patient's last menstrual period was 11/12/2012.    Past Medical History  Diagnosis Date  . Hypertension   . Allergy   . GERD (gastroesophageal reflux disease)   . Thyroid disease   . Anemia     resolved 2011  . Hyperlipidemia     controlled  . Cancer 10/2009    breast  . History of blood transfusion 2009    [facility] -  UNKNOWN NUMBER OF UNITS TRANSFUSED    Past Surgical History  Procedure Date  . Cesarean section   . Wisdom tooth extraction   . Tubal ligation   . Breast surgery 10/2009    right lymp nodes removed  . Left foot surgery     Family History  Problem Relation Age of Onset  . Diabetes Mother   . Hypertension Mother   . Diabetes Maternal Aunt   . Heart disease Maternal Aunt   . Hypertension Maternal Aunt   . Stroke Maternal Aunt   . Diabetes Maternal Grandmother   . Heart disease Maternal Grandmother   . Hypertension Maternal Grandmother   . Stroke Maternal Grandmother   . Diabetes Maternal Aunt   . Hypertension Maternal Aunt     Social History:  reports that she has been smoking Cigarettes.  She has a 10 pack-year smoking history. She has never used smokeless tobacco. She reports that she does not drink alcohol or use illicit drugs. smokes <1/2ppd  Allergies:  Allergies  Allergen Reactions  . Metronidazole Swelling    Prescriptions prior to admission    Medication Sig Dispense Refill  . amLODipine (NORVASC) 10 MG tablet Take by mouth daily.       . cloNIDine (CATAPRES) 0.2 MG tablet Take 0.2 mg by mouth at bedtime.       Marland Kitchen losartan (COZAAR) 100 MG tablet Take by mouth daily.       Marland Kitchen omeprazole (PRILOSEC) 20 MG capsule Take 20 mg by mouth daily.      . tamoxifen (NOLVADEX) 20 MG tablet Take 20 mg by mouth daily. Stop taking 11/24/12      . [DISCONTINUED] tamoxifen (NOLVADEX) 20 MG tablet Take 1 tablet (20 mg total) by mouth daily.  90 tablet  3    ROS  Blood pressure 126/86, pulse 80, temperature 98.6 F (37 C), temperature source Oral, resp. rate 16, height 5\' 1"  (1.549 m), weight 160 lb (72.576 kg), last menstrual period 11/12/2012, SpO2 99.00%. Physical Exam Lungs: CTA CV: RRR Abd: obese, NT, ND. Palpable mass in pelvis   Uterus: The uterus is enlarged, 21.1 x 11.1 x 15.8 cm. Multiple  fibroids are identified.  Left fundal fibroid is 7.8 x 7.1 x 7.5 cm.  Left mid uterine fibroid is 6.6 x 5.8 x 5.6 cm.  Posterior fibroid is 5.8 x 5.2 x 6.6 cm.  Lower uterine segment fibroid is 6.0 x 4.6 x 6.1 cm.  Endometrium: The endometrium is not visualized secondary to  multiple fibroids.  Right ovary: 5.8 x 4.2 x 4.5 cm.  2 small solid hyperechoic areas  are identified, measuring 1.9 x 1.5 x 1.8 cm and 2.5 x 2.1 x 1.5  cm. A simple cyst is 3.0 x 2.5 x 3.0 cm.  Left ovary: 5.3 x 3.8 x 3.4 cm. A simple cyst is 3.2 x 3.2 x 3.1  cm.  Other Findings: No free fluid  IMPRESSION:  1. Multiple uterine fibroids. Largest fibroids have increased in  size.  2. Non-visualized endometrium.  3. Small hyperechoic areas within the right ovary likely  representing involuting follicles. Follow-up ultrasound in 3-4  months is recommended.   Results for orders placed during the hospital encounter of 11/25/12 (from the past 24 hour(s))  PREGNANCY, URINE     Status: Normal   Collection Time   11/25/12 10:01 AM      Component Value Range   Preg Test, Ur  NEGATIVE  NEGATIVE    No results found.  Assessment/Plan: Proceed with TAH/BSO  HARRAWAY-SMITH, Adan Beal 11/25/2012, 10:30 AM

## 2012-11-25 NOTE — Transfer of Care (Signed)
Immediate Anesthesia Transfer of Care Note  Patient: Tammy Blanchard  Procedure(s) Performed: Procedure(s) (LRB) with comments: HYSTERECTOMY ABDOMINAL (N/A) - with Bilateral Salpingoopherectomy and Cystoscopy  Patient Location: PACU  Anesthesia Type:General  Level of Consciousness: sedated  Airway & Oxygen Therapy: Patient Spontanous Breathing and Patient connected to nasal cannula oxygen  Post-op Assessment: Report given to PACU RN and Post -op Vital signs reviewed and stable  Post vital signs: stable  Complications: No apparent anesthesia complications

## 2012-11-25 NOTE — Anesthesia Preprocedure Evaluation (Signed)
Anesthesia Evaluation  Patient identified by MRN, date of birth, ID band Patient awake    Reviewed: Allergy & Precautions, H&P , NPO status , Patient's Chart, lab work & pertinent test results  Airway Mallampati: II TM Distance: >3 FB Neck ROM: full    Dental No notable dental hx. (+) Teeth Intact   Pulmonary    Pulmonary exam normal       Cardiovascular hypertension, Pt. on medications     Neuro/Psych    GI/Hepatic Neg liver ROS, GERD-  Medicated and Controlled,  Endo/Other    Renal/GU negative Renal ROS  negative genitourinary   Musculoskeletal negative musculoskeletal ROS (+)   Abdominal Normal abdominal exam  (+)   Peds negative pediatric ROS (+)  Hematology negative hematology ROS (+)   Anesthesia Other Findings   Reproductive/Obstetrics negative OB ROS                           Anesthesia Physical Anesthesia Plan  ASA: II  Anesthesia Plan: General   Post-op Pain Management:    Induction: Intravenous  Airway Management Planned: Oral ETT  Additional Equipment:   Intra-op Plan:   Post-operative Plan: Extubation in OR  Informed Consent: I have reviewed the patients History and Physical, chart, labs and discussed the procedure including the risks, benefits and alternatives for the proposed anesthesia with the patient or authorized representative who has indicated his/her understanding and acceptance.   Dental Advisory Given  Plan Discussed with: CRNA and Surgeon  Anesthesia Plan Comments:         Anesthesia Quick Evaluation

## 2012-11-25 NOTE — Anesthesia Postprocedure Evaluation (Signed)
Anesthesia Post Note  Patient: Tammy Blanchard  Procedure(s) Performed: Procedure(s) (LRB): HYSTERECTOMY ABDOMINAL (N/A)  Anesthesia type: GA  Patient location: PACU  Post pain: Pain level controlled  Post assessment: Post-op Vital signs reviewed  Last Vitals:  Filed Vitals:   11/25/12 1445  BP: 109/68  Pulse: 69  Temp:   Resp: 15    Post vital signs: Reviewed  Level of consciousness: sedated  Complications: No apparent anesthesia complications

## 2012-11-25 NOTE — Brief Op Note (Signed)
11/25/2012  1:59 PM  PATIENT:  [name]  47 y.o. female  PRE-OPERATIVE DIAGNOSIS:  Fibroid, enlarged uterus with intermittent abdominal pain  POST-OPERATIVE DIAGNOSIS:  Fibroid, enlarged uterus with intermittent abdominal pain  PROCEDURE:  Procedure(s) (LRB) with comments: HYSTERECTOMY ABDOMINAL (N/A) - with Bilateral Salpingoopherectomy and Cystoscopy Cystotomy with repair SURGEON:  Surgeon(s) and Role:    * [name], MD - Primary    * Tereso Newcomer, MD - Assisting   ANESTHESIA:   general  EBL:  Total I/O In: 4000 [I.V.:4000] Out: 1400 [Urine:200; Blood:1200]  BLOOD ADMINISTERED:none  DRAINS: none   LOCAL MEDICATIONS USED:  MARCAINE     SPECIMEN:  Source of Specimen:  uterus, fallopian tubes and ovaries   DISPOSITION OF SPECIMEN:  PATHOLOGY  COUNTS:  YES  TOURNIQUET:  * No tourniquets in log *  DICTATION: .Note written in EPIC  PLAN OF CARE: Admit to inpatient   PATIENT DISPOSITION:  PACU - hemodynamically stable.   Delay start of Pharmacological VTE agent (>24hrs) due to surgical blood loss or risk of bleeding: yes

## 2012-11-26 ENCOUNTER — Encounter (HOSPITAL_COMMUNITY): Payer: Self-pay | Admitting: Obstetrics & Gynecology

## 2012-11-26 LAB — BASIC METABOLIC PANEL
CO2: 25 mEq/L (ref 19–32)
Calcium: 8 mg/dL — ABNORMAL LOW (ref 8.4–10.5)
GFR calc non Af Amer: >90 mL/min (ref >90)
Glucose, Bld: 117 mg/dL — ABNORMAL HIGH (ref 70–99)
Potassium: 3.5 mEq/L (ref 3.5–5.1)
Sodium: 134 mEq/L — ABNORMAL LOW (ref 135–145)

## 2012-11-26 LAB — CBC
Hemoglobin: 8.7 g/dL — ABNORMAL LOW (ref 12.0–15.0)
Platelets: 223 10*3/uL (ref 150–400)
RBC: 3 MIL/uL — ABNORMAL LOW (ref 3.87–5.11)

## 2012-11-26 LAB — MRSA CULTURE

## 2012-11-26 NOTE — Progress Notes (Signed)
UR completed 

## 2012-11-26 NOTE — Procedures (Signed)
Tammy Blanchard PROCEDURE DATE: 11/25/2012  PREOPERATIVE DIAGNOSIS:  Symptomatic fibroids, pelvic pain, cystotomy POSTOPERATIVE DIAGNOSIS:  Symptomatic fibroids, pelvic pain, cystotomy SURGEON:  Gautam Langhorst L. Harraway-Smith, M.D.  ASSISTANT: Jaynie Collins, M.D.  OPERATION:  Total abdominal hysterectomy, Bilateral Salpingectomy,  Bilateral Salpingooophorectomy via transverse incision; cystotomy repair, cystoscopy ANESTHESIA:  General endotracheal.  INDICATIONS: The patient is a 47 y.o. [id number] with the aforementioned diagnoses who desires definitive surgical management. On the preoperative visit, the risks, benefits, indications, and alternatives of the procedure were reviewed with the patient.  On the day of surgery, the risks of surgery were again discussed with the patient including but not limited to: bleeding which may require transfusion or reoperation; infection which may require antibiotics; injury to bowel, bladder, ureters or other surrounding organs; need for additional procedures; thromboembolic phenomenon, incisional problems and other postoperative/anesthesia complications. Written informed consent was obtained.    OPERATIVE FINDINGS: A 24 week size uterus with normal tubes and ovaries bilaterally.  SPECIMENS:  Uterus,cervix,  bilateral fallopian tubes (and ovaries) sent to pathology COMPLICATIONS:  cystotomy.  DESCRIPTION OF PROCEDURE: The patient received intravenous antibiotics and had sequential compression devices applied to her lower extremities while in the preoperative area.   She was taken to the operating room and placed under general anesthesia without difficulty.  The abdomen and perineum were prepped and draped in a sterile manner, and she was placed in a dorsal supine position.  A Foley catheter was inserted into the bladder and attached to constant drainage.  After an adequate timeout was performed, a transverse skin incision was made. This incision was taken down to the  fascia using a scalpel and cautery with care given to maintain good hemostasis.  The fascia was grasped with kocher clamps, tented up and the rectus muscles were dissected off using sharp and blunt dissection on the superior and inferior aspects of the fascial incision.  The peritoneum entered sharply without complication. This peritoneal cavity was entered digitally.  Attention was then turned to the pelvis. The uterus at this point was noted to be mobilized and was delivered up out of the abdomen.  The bowel was packed away with moist laparotomy sponges. The round ligaments on each side were clamped, suture ligated with 0 Vicryl, and transected with electrocautery allowing entry into the broad ligament. Of note, all sutures used in this procedure are 0 Vicryl unless otherwise noted. The anterior and posterior leaves of the broad ligament were separated, and the ureters were inspected to be safely away from the area of dissection bilaterally.  The infundibulopelvic ligaments were bliaterally clamped and doubly suture ligated.  The uterine arteries were skeletinized bilaterally   A bladder flap was then created.  The bladder was then bluntly dissected off the lower uterine segment and cervix with good hemostasis noted. The uterine arteries were then skeletonized bilaterally and then clamped, cut, and doubly suture ligated with care given to prevent ureteral injury. After the uterine arteries were clamped, the uterus was removed with a scalpel and the cervical stump was grasped with a Jacob's tenaculum. The uterosacral ligaments were then clamped, cut, and ligated bilaterally.  Finally, the cardinal ligaments were clamped, cut, and ligated bilaterally. Using [employer], the uterus was removed.  At this point an incidental cystotomy was noted.  The bladder was sharply dissected further from the an vaginal wall. The vaginal cuff was reefed using a suture of vicryl in a running locked fashion.  The cuff angles  were closed with Heaney stiches with  care given to incorporate the uterosacral-cardinal ligament pedicles on both sides. The middle of the vaginal cuff was closed with a series of interrupted figure-of-eight sutures with care given to incorporate the anterior pubocervical fascia and the posterior rectovaginal fascia.  The cystotomy was repaired in 2 layers using 3-0 chromic and 3-0 vicryl.  The foley was clamped and the full bladder was evaluated and found to be intact.  The pelvis was irrigated and hemostasis was reconfirmed at all pedicles and along the pelvic sidewall.  The ureters were inspected and noted to be peristalsing bilaterally. A cystoscopy was performed using normal saline and a 70 degree scope.  After indigo carmine was given to the patient and she was repositioned in the dorsal lithotomy position.  The ureters had proven flow of blue dye from both sides and there was no bleeding from the superior bladder. No sutures were noted within the bladder.  All laparotomy sponges and instruments were removed from the abdomen. The fascia, rectus muscles and peritoneum were closed in a mass fashion using an 0 Vicryl suture in a running fashion. The skin was closed with staples. Sponge, lap, needle, and instrument counts were correct times two. The patient was taken to the recovery area awake, extubated and in stable condition.

## 2012-11-26 NOTE — Anesthesia Postprocedure Evaluation (Signed)
  Anesthesia Post-op Note  Patient: [name]  Procedure(s) Performed: Procedure(s) (LRB) with comments: HYSTERECTOMY ABDOMINAL (N/A) - with Bilateral Salpingoopherectomy and Cystoscopy  Patient Location: [facility]  Anesthesia Type:General  Level of Consciousness: awake  Airway and Oxygen Therapy: Patient Spontanous Breathing  Post-op Pain: none  Post-op Assessment: Post-op Vital signs reviewed  Post-op Vital Signs: Reviewed and stable  Complications: No apparent anesthesia complications

## 2012-11-26 NOTE — Progress Notes (Signed)
1 Day Post-Op Procedure(s) (LRB): HYSTERECTOMY ABDOMINAL (N/A)  Subjective: Patient reports vomiting x 1 last pm.  No nausea at present.  Adequate pain control.      Objective: I have reviewed patient's vital signs, intake and output, medications and labs.  General: alert and mild distress Resp: clear to auscultation bilaterally Cardio: regular rate and rhythm, S1, S2 normal, no murmur, click, rub or gallop GI: soft, non-tender; bowel sounds normal; no masses,  no organomegaly and incision: dry bandage  Assessment: s/p Procedure(s) (LRB) with comments: HYSTERECTOMY ABDOMINAL (N/A) - with Bilateral Salpingoopherectomy and Cystoscopy: and cystotomy repair stable  Plan: Advance diet Encourage ambulation Discontinue IV fluids Continue foley due to h/o cystotomy repair- will go hom ewith foley and leg bag  LOS: 1 day    HARRAWAY-SMITH, Rejeana Fadness 11/26/2012, 11:54 AM

## 2012-11-26 NOTE — Addendum Note (Signed)
Addendum  created 11/26/12 0802 by Suella Grove, CRNA   Modules edited:Notes Section

## 2012-11-27 LAB — CBC
HCT: 22.7 % — ABNORMAL LOW (ref 36.0–46.0)
HCT: 32.8 % — ABNORMAL LOW (ref 36.0–46.0)
Hemoglobin: 7.5 g/dL — ABNORMAL LOW (ref 12.0–15.0)
MCH: 29.1 pg (ref 26.0–34.0)
MCHC: 33 g/dL (ref 30.0–36.0)
MCV: 89.4 fL (ref 78.0–100.0)
MCV: 86.1 fL (ref 78.0–100.0)
RBC: 3.81 MIL/uL — ABNORMAL LOW (ref 3.87–5.11)
RDW: 15.7 % — ABNORMAL HIGH (ref 11.5–15.5)
WBC: 10.1 10*3/uL (ref 4.0–10.5)

## 2012-11-27 MED ORDER — INFLUENZA VIRUS VACC SPLIT PF IM SUSP: 0.5000 mL | INTRAMUSCULAR | Status: DC

## 2012-11-27 MED ORDER — DIPHENHYDRAMINE HCL 25 MG PO CAPS
25.0000 mg | ORAL_CAPSULE | Freq: Once | ORAL | Status: AC
  Administered 2012-11-27: 25 mg via ORAL
  Filled 2012-11-27: qty 1

## 2012-11-27 MED ORDER — ACETAMINOPHEN 325 MG PO TABS
650.0000 mg | ORAL_TABLET | Freq: Once | ORAL | Status: AC
  Administered 2012-11-27: 650 mg via ORAL
  Filled 2012-11-27: qty 2

## 2012-11-27 MED ORDER — DOCUSATE SODIUM 100 MG PO CAPS
100.0000 mg | ORAL_CAPSULE | Freq: Two times a day (BID) | ORAL | Status: DC
  Administered 2012-11-27: 100 mg via ORAL
  Filled 2012-11-27: qty 1

## 2012-11-27 NOTE — Progress Notes (Signed)
2 Days Post-Op Procedure(s) (LRB): HYSTERECTOMY ABDOMINAL (N/A)  Subjective: Patient reports tolerating PO and + flatus.  Pt had episode of emesis yesterday after drinking coffee.  Reports decreased appetite but, no Nausea since that time.  No BM but, flatus.  Adequate pain control.  Objective: I have reviewed patient's vital signs, intake and output, medications, labs and pathology.  General: alert and no distress Resp: clear to auscultation bilaterally Cardio: regular rate and rhythm, S1, S2 normal, no murmur, click, rub or gallop GI: soft, non-tender; bowel sounds normal; no masses,  no organomegaly dressing removed.  Incision clean dry and intact with staples in place.  Assessment: s/p Procedure(s) (LRB) with comments: HYSTERECTOMY ABDOMINAL (N/A) - with Bilateral Salpingoopherectomy and Cystoscopy: stable and anemia The pathologist called yesterday and informed Dr. Marice Potter that the specimen contained malignant cells but, could not determine whether this was related to a primary malignancy or a metastasis from pts prior breast CA.  I explained to pt that malignant cells were discovered but, that the exact etiology was not known.  I explained to her and her partner that we would notify her as the results came in.  I spent with patient and her partner.  Their questions were answered.   The pt is anemic and will likely need further treatment.  I discussed with her a blood transfusion.  She would like to go home after the blood transfusion.      Plan: Advance diet Encourage ambulation keep foley Transfuse 2 units PRBCs  LOS: 2 days    Blanchard, Tammy Alred 11/27/2012, 10:08 AM

## 2012-11-28 LAB — TYPE AND SCREEN
Unit division: 0
Unit division: 0

## 2012-11-28 MED ORDER — DOCUSATE SODIUM 100 MG PO CAPS: 100.0000 mg | ORAL_CAPSULE | Freq: Two times a day (BID) | ORAL | Status: DC | PRN

## 2012-11-28 MED ORDER — OXYCODONE-ACETAMINOPHEN 5-325 MG PO TABS: 1.0000 | ORAL_TABLET | ORAL | Status: DC | PRN

## 2012-11-28 MED ORDER — IBUPROFEN 600 MG PO TABS: 600.0000 mg | ORAL_TABLET | Freq: Four times a day (QID) | ORAL | Status: DC | PRN

## 2012-11-28 NOTE — Progress Notes (Signed)
Patient refused CBC.  Patient stated " I will wait to have the doctor draw it when I go to the office on Thursday."  Dr. Erin Fulling notified, no orders received.

## 2012-11-28 NOTE — Discharge Summary (Signed)
Physician Discharge Summary  Patient ID: Tammy Blanchard MRN: [id number] DOB/AGE: 1966/01/14 47 y.o.  Admit date: 11/25/2012 Discharge date: 11/28/2012  Admission Diagnoses: symptomatic uterine fibroids  Discharge Diagnoses:  Active Problems:  * No active hospital problems. *  Anemia due to acute post op blood loss Unspecified tumor/malignancy of uterus- pathology pending  Discharged Condition: good  Hospital Course: Pt was admitted and underwent a TAH/BSO with cystectomy and cystotomy repair.  Her post op course was complicated with some mild nausea and emesis on POD #1.  She had a falling Hct by POD#2 her Hct was %22.  She was asymptomatic but, with her h/o HTN and the potential need for further treatment, she was transfused 2 units of PRBC's.  By POD#3 pt had an adequate appetite with no N/V and  Was tolerating her diet well.  She was without F/C.  She will be d/c'd home with her foley in place.     Consults: None  Significant Diagnostic Studies: labs: CBC  Treatments: IV hydration, analgesia: acetaminophen w/ codeine and Morphine, anticoagulation: LMW heparin and surgery: TAH/BSO; cystotomy with repair and cystoscopy   Discharge Exam: Blood pressure 136/86, pulse 99, temperature 99.1 F (37.3 C), temperature source Oral, resp. rate 19, height 5\' 1"  (1.549 m), weight 160 lb (72.576 kg), last menstrual period 11/12/2012, SpO2 99.00%. General appearance: alert and no distress Resp: clear to auscultation bilaterally Cardio: regular rate and rhythm, S1, S2 normal, no murmur, click, rub or gallop GI: soft, non-tender; bowel sounds normal; no masses,  no organomegaly Incision/Wound:staples in place; incision clean, dry and intact  Disposition: 01-Home or Self Care  Discharge Orders    Future Appointments: Provider: Department: Dept Phone: Center:   01/21/2013 2:45 PM [name] [facility] [phone] None   01/21/2013 3:15 PM [name], PA [facility] [phone] None     Future Orders Please Complete By Expires   Diet - low sodium heart healthy      Increase activity slowly      Discharge instructions      Comments:   KEEP Foley. Please instruct patinet on leg bag care   Driving Restrictions      Comments:   No driving for 2 weeks and while on narcotics   Lifting restrictions      Comments:   No lifting, bending or stooping.   Sexual Activity Restrictions      Comments:   No intercourse for 6 full weeks!  NOTHING in vagina for 6 weeks   Discharge wound care:      Comments:   Keep incision and staples clean and dry   Call MD for:  temperature >100.4      Call MD for:  persistant nausea and vomiting      Call MD for:  severe uncontrolled pain      Call MD for:  redness, tenderness, or signs of infection (pain, swelling, redness, odor or green/yellow discharge around incision site)      Call MD for:  difficulty breathing, headache or visual disturbances      Call MD for:  extreme fatigue      Call MD for:      Scheduling Instructions:   Anything other than scant vaginal bleeding   Discharge patient      Comments:   To home       Medication List     As of 11/28/2012  9:05 AM  TAKE these medications         amLODipine 10 MG tablet   Commonly known as: NORVASC   Take by mouth daily.      cloNIDine 0.2 MG tablet   Commonly known as: CATAPRES   Take 0.2 mg by mouth at bedtime.      docusate sodium 100 MG capsule   Commonly known as: COLACE   Take 1 capsule (100 mg total) by mouth 2 (two) times daily as needed for constipation.      ibuprofen 600 MG tablet   Commonly known as: ADVIL,MOTRIN   Take 1 tablet (600 mg total) by mouth every 6 (six) hours as needed for pain.      losartan 100 MG tablet   Commonly known as: COZAAR   Take by mouth daily.      omeprazole 20 MG capsule   Commonly known as: PRILOSEC   Take 20 mg by mouth daily.      oxyCODONE-acetaminophen 5-325  MG per tablet   Commonly known as: PERCOCET/ROXICET   Take 1-2 tablets by mouth every 4 (four) hours as needed (moderate to severe pain ).      tamoxifen 20 MG tablet   Commonly known as: NOLVADEX   Take 20 mg by mouth daily. Stop taking 11/24/12           Follow-up Information    Follow up with [facility], Rexanna Louthan, MD. In 5 days. (12/02/12 @1pm )    Contact information:   [address] [place] [address] [phone]          Signed: [name] 11/28/2012, 9:05 AM

## 2012-11-28 NOTE — Progress Notes (Signed)
Discharge instructions reviewed with patient and husband.  Both instructed on leg bag, how to connect, how to drain and how to reconnect larger foley bag if patient desired.  Both demonstrated proper technique for changing and emptying bags.  Discussed activity, medications, signs/symptoms to seek help for and those to report to MD and follow up MD visit.  No home equipment needed.  Wheelchair to car with staff without incident and discharged to home with husband.

## 2012-11-28 NOTE — Progress Notes (Signed)
Patient has refused lab work of CBC this morning.  " I don't want to be stuck again, it is always difficult to find a vein and they already check my CBC last night after the blood transfusion anyway."

## 2012-12-02 ENCOUNTER — Encounter: Payer: Self-pay | Admitting: Obstetrics & Gynecology

## 2012-12-02 ENCOUNTER — Ambulatory Visit (INDEPENDENT_AMBULATORY_CARE_PROVIDER_SITE_OTHER): Payer: 59 | Admitting: Obstetrics & Gynecology

## 2012-12-02 VITALS — BP 127/84 | HR 78 | Temp 98.3°F | Ht 61.0 in | Wt 154.2 lb

## 2012-12-02 DIAGNOSIS — Z9889 Other specified postprocedural states: Secondary | ICD-10-CM

## 2012-12-02 DIAGNOSIS — C50919 Malignant neoplasm of unspecified site of unspecified female breast: Secondary | ICD-10-CM

## 2012-12-02 DIAGNOSIS — C779 Secondary and unspecified malignant neoplasm of lymph node, unspecified: Secondary | ICD-10-CM

## 2012-12-02 DIAGNOSIS — Z09 Encounter for follow-up examination after completed treatment for conditions other than malignant neoplasm: Secondary | ICD-10-CM

## 2012-12-02 NOTE — Patient Instructions (Signed)
Hysterectomy  Care After  Refer to this sheet in the next few weeks. These instructions provide you with information on caring for yourself after your procedure. Your caregiver may also give you more specific instructions. Your treatment has been planned according to current medical practices, but problems sometimes occur. Call your caregiver if you have any problems or questions after your procedure.  HOME CARE INSTRUCTIONS   Healing will take time. You may have discomfort, tenderness, swelling, and bruising at the surgical site for about 2 weeks. This is normal and will get better as time goes on.   Only take over-the-counter or prescription medicines for pain, discomfort, or fever as directed by your caregiver.   Do not take aspirin. It can cause bleeding.   Do not drive when taking pain medicine.   Follow your caregiver's advice regarding exercise, lifting, driving, and general activities.   Resume your usual diet as directed and allowed.   Get plenty of rest and sleep.   Do not douche, use tampons, or have sexual intercourse for at least 6 weeks or until your caregiver gives you permission.   Change your bandages (dressings) as directed by your caregiver.   Monitor your temperature.   Take showers instead of baths for 2 to 3 weeks.   Do not drink alcohol until your caregiver gives you permission.   If you are constipated, you may take a mild laxative with your caregiver's permission. Bran foods may help with constipation problems. Drinking enough fluids to keep your urine clear or pale yellow may help as well.   Try to have someone home with you for 1 or 2 weeks to help around the house.   Keep all of your follow-up appointments as directed by your caregiver.  SEEK MEDICAL CARE IF:    You have swelling, redness, or increasing pain in the surgical cut (incision) area.   You have pus coming from the incision.   You notice a bad smell coming from the incision or dressing.   You have swelling,  redness, or pain around the intravenous (IV) site.   Your incision breaks open.   You feel dizzy or lightheaded.   You have pain or bleeding when you urinate.   You have persistent diarrhea.   You have persistent nausea and vomiting.   You have abnormal vaginal discharge.   You have a rash.   You have any type of abnormal reaction or develop an allergy to your medicine.   Your pain is not controlled with your prescribed medicine.  SEEK IMMEDIATE MEDICAL CARE IF:    You have a fever.   You have severe abdominal pain.   You have chest pain.   You have shortness of breath.   You faint.   You have pain, swelling, or redness of your leg.   You have heavy vaginal bleeding with blood clots.  MAKE SURE YOU:   Understand these instructions.   Will watch your condition.   Will get help right away if you are not doing well or get worse.  Document Released: 05/09/2005 Document Revised: 01/12/2012 Document Reviewed: 06/06/2011  [employer] Patient Information 2013 [employer].

## 2012-12-02 NOTE — Progress Notes (Signed)
Subjective:     Patient ID: Tammy Blanchard, female   DOB: Jul 25, 1966, 47 y.o.   MRN: [id number]  HPI Pt for 1 week post op check. Pt without n/v.  Had had flatus but, has not had a BM since she left the hosp.  Pt has not smoked since she was admitted to the hops!!!!  Has indwelling foley.  No problems but, is glad that it is being removed today    Review of Systems     Objective:   Physical Exam Pt in NAD BP 127/84  Pulse 78  Temp 98.3 F (36.8 C) (Oral)  Ht 5\' 1"  (1.549 m)  Wt 154 lb 3.2 oz (69.945 kg)  BMI 29.14 kg/m2  LMP 11/12/2012 Abd:  Soft, NT/ND.  Staples in place- removed.  Steristrips applied GU: foley cath removed  surg path: 11/25/12 ADDITIONAL INFORMATION: PROGNOSTIC INDICATORS - ACIS Results IMMUNOHISTOCHEMICAL AND MORPHOMETRIC ANALYSIS BY THE AUTOMATED CELLULAR IMAGING SYSTEM (ACIS) Estrogen Receptor (Negative, <1%): 30%, MODERATE STAINING INTENSITY Progesterone Receptor (Negative, <1%): 20%, MODERATE STAINING INTENSITY All controls stained appropriately Pecola Leisure MD Pathologist, Electronic Signature ( Signed 12/01/2012) CHROMOGENIC IN-SITU HYBRIDIZATION Interpretation HER-2/NEU BY CISH - NO AMPLIFICATION OF HER-2 DETECTED. THE RATIO OF HER-2: CEP 17 SIGNALS WAS 1.21. Reference range: Ratio: HER2:CEP17 < 1.8 - gene amplification not observed Ratio: HER2:CEP 17 1.8-2.2 - equivocal result Ratio: HER2:CEP17 > 2.2 - gene amplification observed Pecola Leisure MD Pathologist, Electronic Signature ( Signed 12/01/2012) FINAL DIAGNOSIS 1 of 3 Duplicate copy FINAL for Tammy Blanchard, Tammy Blanchard ([id number]) Diagnosis Uterus, ovaries and fallopian tubes, with cervix - METASTATIC CARCINOMA OF BREAST PRIMARY, INVOLVING THE ENDOMETRIUM, MYOMETRIUM, CERVIX, UTERINE SEROSA, BILATERAL OVARIES AND FALLOPIAN TUBES. - LEIOMYOMATA. Microscopic Comment The tumor cells are strongly positive for breast primary marker GCDFP-15 and CK7, negative for CDX-2 and CK20. The findings are  diagnostic for metastatic carcinoma of breast primary. Dr. Marice Potter was notified on 11/26/2012. A breast prognostic profile and gross cystic fluid protein-15 will be performed. Dr. Laureen Ochs agrees. The morphologic features seen in this case are similar to those seen in previous case [id number]. (HCL:eps 11/26/12)           Assessment:    s/p TAH/BSO with incidental cystotomy and repair- had indwelling foley removed today 1 week post op check Metastatic breast ca- I had previously reviewed the initial path with pt in the hosp.  Today, I reviewed with her that the disease in her pelvis was metastasized from her breast.   I reviewed this with her and her partner.  She was disappointed but, plans to meet with Dr. Darnelle Catalan     Plan:     Tammy Blanchard removed Foley removed  F/u 4 weeks or sooner prn F/u  With Dr. Darnelle Catalan- I called and spoke with him and his office will arrange f/u with Tammy Blanchard Encourgaed her tobacco cessation gradual increase of activity

## 2012-12-05 ENCOUNTER — Other Ambulatory Visit: Payer: Self-pay | Admitting: Oncology

## 2012-12-05 DIAGNOSIS — C50919 Malignant neoplasm of unspecified site of unspecified female breast: Secondary | ICD-10-CM

## 2012-12-05 NOTE — Progress Notes (Signed)
I called Tammy Blanchard to discuss the results of her TAH/BSO, which showed metastatic disease in these organs. She is going to have some lab work and a PET scan before returning to see me in about 2 weeks. She has a good preliminary understanding of the situation.

## 2012-12-06 ENCOUNTER — Telehealth: Payer: Self-pay | Admitting: Oncology

## 2012-12-06 NOTE — Telephone Encounter (Signed)
S/w pt re appts for 2/6 and 2/14.

## 2012-12-07 ENCOUNTER — Telehealth: Payer: Self-pay | Admitting: *Deleted

## 2012-12-07 NOTE — Telephone Encounter (Signed)
Message received from pt requesting appointment for MD to be rescheduled to before 2/14. " my scans are on the 6th and I just can't wait that long to find out if I have cancer again ".  Per MD review appointment rescheduled to 2/10- called given number of [phone] and left message for new date and time on VM.

## 2012-12-08 ENCOUNTER — Ambulatory Visit (INDEPENDENT_AMBULATORY_CARE_PROVIDER_SITE_OTHER): Payer: 59

## 2012-12-08 DIAGNOSIS — R3989 Other symptoms and signs involving the genitourinary system: Secondary | ICD-10-CM

## 2012-12-08 DIAGNOSIS — R399 Unspecified symptoms and signs involving the genitourinary system: Secondary | ICD-10-CM

## 2012-12-08 LAB — POCT URINALYSIS DIP (DEVICE)
Glucose, UA: NEGATIVE mg/dL
Ketones, ur: NEGATIVE mg/dL
Protein, ur: >=300 mg/dL — AB
Specific Gravity, Urine: 1.025 (ref 1.005–1.030)
Urobilinogen, UA: 0.2 mg/dL (ref 0.0–1.0)

## 2012-12-08 MED ORDER — SULFAMETHOXAZOLE-TRIMETHOPRIM 800-160 MG PO TABS: 1.0000 | ORAL_TABLET | Freq: Two times a day (BID) | ORAL | Status: DC

## 2012-12-08 MED ORDER — PHENAZOPYRIDINE HCL 200 MG PO TABS
200.0000 mg | ORAL_TABLET | Freq: Three times a day (TID) | ORAL | Status: DC
Start: 2012-12-08 — End: 2012-12-29

## 2012-12-08 NOTE — Progress Notes (Signed)
I called pt and pt informed me that she was burning with urination.  Sometimes she would have the urge like she had to urinate but then could not.  Pt c/o back pain.  UA was resulted.  Order pt septra DS and pyridium according to standing orders.  Informed pt that her urine would be sent off for urine culture and if she needs any other tx than what was prescribed then we would give her a call back. Pt stated understanding and did not have any other further questions.

## 2012-12-09 ENCOUNTER — Other Ambulatory Visit (HOSPITAL_BASED_OUTPATIENT_CLINIC_OR_DEPARTMENT_OTHER): Payer: 59 | Admitting: Lab

## 2012-12-09 ENCOUNTER — Encounter (HOSPITAL_COMMUNITY)
Admission: RE | Admit: 2012-12-09 | Discharge: 2012-12-09 | Disposition: A | Discharge status: Home / Self Care | Payer: 59 | Source: Ambulatory Visit | Attending: Oncology | Admitting: Oncology

## 2012-12-09 DIAGNOSIS — C50919 Malignant neoplasm of unspecified site of unspecified female breast: Secondary | ICD-10-CM

## 2012-12-09 DIAGNOSIS — C50419 Malignant neoplasm of upper-outer quadrant of unspecified female breast: Secondary | ICD-10-CM

## 2012-12-09 LAB — COMPREHENSIVE METABOLIC PANEL (CC13)
ALT: 43 U/L (ref 0–55)
AST: 22 U/L (ref 5–34)
Albumin: 3.6 g/dL (ref 3.5–5.0)
Alkaline Phosphatase: 175 U/L — ABNORMAL HIGH (ref 40–150)
BUN: 9.3 mg/dL (ref 7.0–26.0)
Chloride: 109 mEq/L — ABNORMAL HIGH (ref 98–107)
Potassium: 3.9 mEq/L (ref 3.5–5.1)
Sodium: 140 mEq/L (ref 136–145)

## 2012-12-09 LAB — CBC WITH DIFFERENTIAL/PLATELET
Basophils Absolute: 0 10*3/uL (ref 0.0–0.1)
Eosinophils Absolute: 0.2 10*3/uL (ref 0.0–0.5)
HGB: 11.1 g/dL — ABNORMAL LOW (ref 11.6–15.9)
MCV: 86.5 fL (ref 79.5–101.0)
MONO#: 0.6 10*3/uL (ref 0.1–0.9)
MONO%: 6.3 % (ref 0.0–14.0)
NEUT#: 6.9 10*3/uL — ABNORMAL HIGH (ref 1.5–6.5)
RBC: 3.79 10*6/uL (ref 3.70–5.45)
RDW: 14.5 % (ref 11.2–14.5)
WBC: 9.7 10*3/uL (ref 3.9–10.3)
lymph#: 2 10*3/uL (ref 0.9–3.3)

## 2012-12-09 MED ORDER — FLUDEOXYGLUCOSE F - 18 (FDG) INJECTION
16.9000 | Freq: Once | INTRAVENOUS | Status: AC | PRN
  Administered 2012-12-09: 16.9 via INTRAVENOUS

## 2012-12-11 LAB — URINE CULTURE: Colony Count: >=100000

## 2012-12-13 ENCOUNTER — Other Ambulatory Visit: Payer: Self-pay | Admitting: Oncology

## 2012-12-14 ENCOUNTER — Telehealth: Payer: Self-pay | Admitting: *Deleted

## 2012-12-14 MED ORDER — CIPROFLOXACIN HCL 500 MG PO TABS: 500.0000 mg | ORAL_TABLET | Freq: Two times a day (BID) | ORAL | Status: DC

## 2012-12-14 NOTE — Telephone Encounter (Signed)
Pt left message requesting results of recent urine culture. Pt also stated that she has finished her medication but still has pain and same sx. She is requesting whether she needs more medication.  I reviewed urine cx results and then consulted with Dr. Macon Large. She prescribed Cipro. I notified pt of additional Rx for second course of antibiotic. I offered pt additional pyridium as well but she stated that she still has some.  Pt voiced understanding of plan of care and had no further questions.

## 2012-12-17 ENCOUNTER — Ambulatory Visit: Payer: 59 | Admitting: Oncology

## 2012-12-20 ENCOUNTER — Ambulatory Visit (HOSPITAL_BASED_OUTPATIENT_CLINIC_OR_DEPARTMENT_OTHER): Payer: 59 | Admitting: Oncology

## 2012-12-20 VITALS — BP 147/99 | HR 94 | Temp 98.2°F | Resp 20 | Ht 61.0 in | Wt 154.8 lb

## 2012-12-20 DIAGNOSIS — C7982 Secondary malignant neoplasm of genital organs: Secondary | ICD-10-CM

## 2012-12-20 DIAGNOSIS — C50419 Malignant neoplasm of upper-outer quadrant of unspecified female breast: Secondary | ICD-10-CM

## 2012-12-20 DIAGNOSIS — C7960 Secondary malignant neoplasm of unspecified ovary: Secondary | ICD-10-CM

## 2012-12-20 DIAGNOSIS — C50919 Malignant neoplasm of unspecified site of unspecified female breast: Secondary | ICD-10-CM

## 2012-12-20 DIAGNOSIS — Z170 Estrogen receptor positive status [ER+]: Secondary | ICD-10-CM

## 2012-12-20 MED ORDER — LOSARTAN POTASSIUM 100 MG PO TABS: 100.0000 mg | ORAL_TABLET | Freq: Every day | ORAL | Status: DC

## 2012-12-20 MED ORDER — ANASTROZOLE 1 MG PO TABS: 1.0000 mg | ORAL_TABLET | Freq: Every day | ORAL | Status: DC

## 2012-12-20 NOTE — Progress Notes (Signed)
ID: Tammy Blanchard   DOB: [birth year]  MR#: [id number]  WUJ#:[id number]  PCP: Lehman Prom, NP SU: GYNWillodean Rosenthal OTHER MD: Georgianne Fick  HISTORY OF PRESENT ILLNESS: The [facility] showing dense breasts with microcalcifications in both breasts, so she was called back for bilateral diagnostic mammograms February 18, 2008.  These showed diffuse calcifications, particularly in the lateral aspect of the right breast.  They were felt by Dr. Judyann Munson to be probably benign bilaterally, but to require short interval follow-up.  So she was set up for a six-month mammogram, which she did not show up for.  She also did not return for her April mammography this year.    However, in July, she had discomfort in the right breast and palpated a mass, which she said was also visible to her.  She brought this to the attention of Dr. Delrae Alfred at [facility], and was set up for diagnostic mammography at the [facility] on May 25, 2009.  This again showed dense breasts, but there was now an area of increased density and architectural distortion in the upper-outer right breast, corresponding to the mass palpated by the patient.  Dr. Azucena Kuba was able to palpate the mass as well, and it measured 3.0 cm by ultrasound, being irregularly marginated and inhomogeneous.  In the left breast there was a cluster of microcalcifications, but no ultrasonographic finding.  A decision was made to biopsy both breasts, and this was done on August 2.  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  On the left side there was only atypical ductal hyperplasia.  The invasive right-sided tumor was ER+ at 98%, PR+ at 96+, with an MIB-1 of 44% and was negative for HER-2 amplification by CISH with a ratio of 0.97.   With this information, the patient was referred to Dr. Carolynne Edouard, and bilateral breast MRIs were obtained August 9.  This showed on  the right an irregular enhancing mass measuring up to 4.6 cm (including a small anterior nodular component, which extends within 8 mm of the nipple).  There were no other areas of abnormal enhancement in either breast, and no abnormal appearing lymph nodes bilaterally.    The patient received neoadjuvant chemotherapy consisting of 6 q. three-week doses of docetaxel/doxorubicin/cyclophosphamide, completed in December 2010. 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 3 of 10 lymph nodes were positive. Tumor was strongly ER, PR positive and HER-2/Blanchard negative with a high proliferation fraction. Her subsequent history is as detailed below.  INTERVAL HISTORY: [name] returns today with both her parents, her daughter and her daughter's son, and since he has significant other for followup of [name] is breast cancer. Since her last visit here she had hysterectomy and bilateral stopping the oophorectomy for fibroids, with the pathology unexpectedly showing metastatic breast cancer, estrogen receptor 30% and progesterone receptor 20% positive, HER-2 negative.  REVIEW OF SYSTEMS: She has been on tamoxifen, which she tolerates well. She doesn't have vaginal dryness or vaginal wetness problems. She has mild hot flashes. She has not been exercising regularly but plans to start walking next week. She had a urinary tract infection recently which she is completing antibiotics for. She has no symptoms related to that at present. She is moderately constipated, but is doing better now with MiraLAX. Otherwise a detailed  review of systems today was noncontributory and in particular she did well with her TAH/BSO, without unusual pain, fever, bleeding, or other complications.  PAST MEDICAL HISTORY: Past Medical History  Diagnosis Date  . Hypertension   . Allergy   . GERD (gastroesophageal  reflux disease)   . Thyroid disease   . Anemia     resolved 2011  . Hyperlipidemia     controlled  . Cancer 10/2009    breast  . History of blood transfusion 2009    [facility] -  UNKNOWN NUMBER OF UNITS TRANSFUSED    PAST SURGICAL HISTORY: Past Surgical History  Procedure Laterality Date  . Cesarean section    . Wisdom tooth extraction    . Tubal ligation    . Breast surgery  10/2009    right lymp nodes removed  . Left foot surgery    . Abdominal hysterectomy  11/25/2012    Procedure: HYSTERECTOMY ABDOMINAL;  Surgeon: [name], MD;  Location: [facility];  Service: Gynecology;  Laterality: N/A;  with Bilateral Salpingoopherectomy and Cystoscopy    FAMILY HISTORY Family History  Problem Relation Age of Onset  . Diabetes Mother   . Hypertension Mother   . Diabetes Maternal Aunt   . Heart disease Maternal Aunt   . Hypertension Maternal Aunt   . Stroke Maternal Aunt   . Diabetes Maternal Grandmother   . Heart disease Maternal Grandmother   . Hypertension Maternal Grandmother   . Stroke Maternal Grandmother   . Diabetes Maternal Aunt   . Hypertension Maternal Aunt     GYNECOLOGIC HISTORY: updated OCT 2013 She is GX P4, first pregnancy at age 47.  She still having periods, although irregularly. The most recent one occurred earlier this month.  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  She is widowed.  She tells me her husband was hit by [place]. Her children are Tammy Blanchard, 29, who lives in [place], but is currently unemployed. He has a 30-year-old daughter. The patient's daughter Tammy Blanchard,  has 3 children. She lives in [place]. Son Tammy Blanchard, has one child. He is Social research officer, government of [employer] here in [place]. Son Tammy Blanchard,  is a Paediatric nurse. He has one daughter. He lives in [place].  The patient is a member of [facility].     ADVANCED DIRECTIVES:  HEALTH MAINTENANCE: History  Substance Use Topics  .  Smoking status: Former Smoker -- 0.50 packs/day for 20 years    Types: Cigarettes    Quit date: 11/25/2012  . Smokeless tobacco: Never Used  . Alcohol Use: No     Colonoscopy:  PAP:  Bone density:  Lipid panel:  Allergies  Allergen Reactions  . Metronidazole Swelling    Current Outpatient Prescriptions  Medication Sig Dispense Refill  . amLODipine (NORVASC) 10 MG tablet Take by mouth daily.       . ciprofloxacin (CIPRO) 500 MG tablet Take 1 tablet (500 mg total) by mouth 2 (two) times daily.  14 tablet  0  . cloNIDine (CATAPRES) 0.2 MG tablet Take 0.2 mg by mouth at bedtime.       . docusate sodium (COLACE) 100 MG capsule Take 1 capsule (100 mg total) by mouth 2 (two) times daily as needed for constipation.  30 capsule  0  . ibuprofen (ADVIL,MOTRIN) 600 MG tablet Take 1 tablet (600 mg total) by mouth every 6 (six) hours as needed for pain.  30 tablet  1  .  losartan (COZAAR) 100 MG tablet Take by mouth daily.       Marland Kitchen omeprazole (PRILOSEC) 20 MG capsule Take 20 mg by mouth daily.      . phenazopyridine (PYRIDIUM) 200 MG tablet Take 1 tablet (200 mg total) by mouth 3 (three) times daily.  6 tablet  0  . sulfamethoxazole-trimethoprim (BACTRIM DS,SEPTRA DS) 800-160 MG per tablet Take 1 tablet by mouth 2 (two) times daily.  6 tablet  0   No current facility-administered medications for this visit.    OBJECTIVE: Middle-aged [place] American woman who was tearful today Filed Vitals:   12/20/12 1629  BP: 147/99  Pulse: 94  Temp: 98.2 F (36.8 C)  Resp: 20     Body mass index is 29.26 kg/(m^2).    ECOG FS: 0  Sclerae unicteric Oropharynx clear No cervical or supraclavicular adenopathy Lungs no rales or rhonchi Heart regular rate and rhythm Abd soft, positive bowel sounds, no masses palpated, no tenderness; lower pelvic incision nicely healed MSK no focal spinal tenderness, no peripheral edema Neuro: nonfocal, well oriented Breasts: The right breast is lumpy but unchanged from  prior  There is nipple distortion, as previously noted. The left breast is unremarkable. Skin: Unremarkable  LAB RESULTS: Lab Results  Component Value Date   WBC 9.7 12/09/2012   NEUTROABS 6.9* 12/09/2012   HGB 11.1* 12/09/2012   HCT 32.8* 12/09/2012   MCV 86.5 12/09/2012   PLT 401* 12/09/2012      Chemistry      Component Value Date/Time   NA 140 12/09/2012 0906   NA 134* 11/26/2012 0540   K 3.9 12/09/2012 0906   K 3.5 11/26/2012 0540   CL 109* 12/09/2012 0906   CL 101 11/26/2012 0540   CO2 20* 12/09/2012 0906   CO2 25 11/26/2012 0540   BUN 9.3 12/09/2012 0906   BUN 7 11/26/2012 0540   CREATININE 0.7 12/09/2012 0906   CREATININE 0.49* 11/26/2012 0540      Component Value Date/Time   CALCIUM 9.7 12/09/2012 0906   CALCIUM 8.0* 11/26/2012 0540   ALKPHOS 175* 12/09/2012 0906   ALKPHOS 116 02/19/2012 0949   AST 22 12/09/2012 0906   AST 25 02/19/2012 0949   ALT 43 12/09/2012 0906   ALT 27 02/19/2012 0949   BILITOT 0.56 12/09/2012 0906   BILITOT 0.6 02/19/2012 0949       Lab Results  Component Value Date   LABCA2 30 08/23/2012     STUDIES: Nm Pet Image Restag (ps) Skull Base To Thigh  12/09/2012  *RADIOLOGY REPORT*  Clinical Data: Subsequent treatment strategy for metastatic breast cancer.  NUCLEAR MEDICINE PET SKULL BASE TO THIGH  Fasting Blood Glucose:  90  Technique:  16.9 mCi F-18 FDG was injected intravenously. CT data was obtained and used for attenuation correction and anatomic localization only.  (This was not acquired as a diagnostic CT examination.) Additional exam technical data entered on technologist worksheet.  Comparison:  CT chest dated 06/27/2009  Findings:  Neck: No hypermetabolic lymph nodes in the neck.  Chest:  No hypermetabolic mediastinal or hilar nodes.  Right axillary lymph node dissection.  10 mm short-axis left axillary node with preservation of the normal fatty hilum (series 2/image 68), max SUV 1.4.  No suspicious pulmonary nodules on the CT scan.  Abdomen/Pelvis:  Stranding with  associated hypermetabolism in the anterior abdominal wall, max SUV 4.4 (PET image 192), postsurgical. 3.8 x 2.6 cm fluid collection in the left pelvic sidewall (series 2/image 193),  non-FDG-avid, likely a postoperative seroma. Additional postoperative stranding/fluid/hemorrhage in the pelvis.  Bilateral adnexal/pelvic soft tissue masses are present in this patient with history of bilateral salpingo-oophorectomy.  Left pelvic mass measures 1.5 x 2.7 cm (series 2/image 177), max SUV 4.0.  Right pelvic mass measures 2.4 x 2.2 cm (series 2/image 179), max SUV 4.4.  No abnormal hypermetabolic activity within the liver, pancreas, adrenal glands, or spleen.  Severe hepatic steatosis.  Mild left hydronephrosis.  No hypermetabolic lymph nodes in the abdomen or pelvis.  Skeleton:  No focal hypermetabolic activity to suggest skeletal metastasis.  However, there is diffuse sclerosis involving almost the entire visualized axial and appendicular skeleton on CT.  IMPRESSION: Postsurgical changes related to recent TAH BSO, as described above. Please note that due to the short interval from surgery, evaluation for underlying pelvic tumor is limited.  Bilateral adnexal/pelvic soft tissue masses, as described above, max SUV 4.4.  Given the clinical setting of recent BSO, these findings are favored to be postsurgical.  Consider follow-up pelvic MRI with/without contrast in 3 months.  Suspected diffuse osseous metastases.  Mild left hydronephrosis.   Original Report Authenticated By: Charline Bills, M.D.     ASSESSMENT: A 47 y.o. [place] woman with dense breasts  (1) status post bilateral breast biopsies in August 2010.  On the left, only atypical ductal hyperplasia.  On the right, high-grade invasive ductal carcinoma, 4.6 cm by MRI.    (2)  Treated in the neoadjuvant setting with docetaxel, doxorubicin, and cyclophosphamide x6, chemotherapy completed in December 2010.    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  Three of 10 lymph nodes were positive.  Tumor was strongly estrogen receptor/progesterone receptor positive, HER2/Blanchard negative with high proliferation fraction.   (4)  Received radiation therapy, completed May 2011,   (5)  on tamoxifen May 2011 to January 2014 when she was found to have stage IV disease  (6) s/p TAH-BSO 11/25/2012 with metastatic brast cancer, estrogen receptor 30% and progestrerone receptor 20% positive, HER-2 negative  (7) anastrozole started February 2014  PLAN: We spent the better part of today's hour-long visit going over her situation. She understands the breast cancer in her uterus and ovaries has been present since before her definitive surgery January 2011. This means the cancer was there at least 3 years without causing symptoms. In short, I tried to reassure her that this is not a fast growing cancer.  She very likely does have spread to bone (as suggested by the PET scan and her elevated alkaline phosphatase).  She understands that stage IV breast cancer is not curable but it is very treatable. The goal of treatments is control and in the absence of any vital organ involvement such as the lungs or liver would prefer to continue antiestrogen treatment. Accordingly she was started on anastrozole today. She has a good understanding of the possible toxicities, side effects and complications.  She is going to see [place] again in 2 months to make sure she is tolerating the anastrozole well, and we will likely start her on zoledronic acid at her next visit, but I have asked her to get her teeth looked at (she has not been to a dentist for years) to make sure she will not need any extractions or other major dental work prior to starting bisphosphonates.   She knows to call  for any problems that may develop before the next visit.  Madalyn Legner  C    12/20/2012

## 2012-12-23 ENCOUNTER — Telehealth: Payer: Self-pay | Admitting: Oncology

## 2012-12-23 NOTE — Telephone Encounter (Signed)
Pt called today and states she was seen 2/17 and GM wanted to see her again in 32months. No pof was sent to scheduling. Per the order/pof attached to 2/17 office visit pt was given a 32month f/u w/AB.

## 2012-12-29 ENCOUNTER — Encounter (HOSPITAL_COMMUNITY): Payer: Self-pay | Admitting: *Deleted

## 2012-12-29 ENCOUNTER — Inpatient Hospital Stay (HOSPITAL_COMMUNITY)
Admission: AD | Admit: 2012-12-29 | Discharge: 2012-12-29 | Disposition: A | Discharge status: Home / Self Care | Payer: 59 | Source: Ambulatory Visit | Attending: Obstetrics & Gynecology | Admitting: Obstetrics & Gynecology

## 2012-12-29 DIAGNOSIS — B3731 Acute candidiasis of vulva and vagina: Secondary | ICD-10-CM | POA: Insufficient documentation

## 2012-12-29 DIAGNOSIS — Z853 Personal history of malignant neoplasm of breast: Secondary | ICD-10-CM | POA: Insufficient documentation

## 2012-12-29 DIAGNOSIS — N949 Unspecified condition associated with female genital organs and menstrual cycle: Secondary | ICD-10-CM | POA: Insufficient documentation

## 2012-12-29 DIAGNOSIS — Z90710 Acquired absence of both cervix and uterus: Secondary | ICD-10-CM | POA: Insufficient documentation

## 2012-12-29 DIAGNOSIS — B373 Candidiasis of vulva and vagina: Secondary | ICD-10-CM | POA: Insufficient documentation

## 2012-12-29 LAB — URINALYSIS, ROUTINE W REFLEX MICROSCOPIC
Ketones, ur: NEGATIVE mg/dL
Protein, ur: NEGATIVE mg/dL
Urobilinogen, UA: 0.2 mg/dL (ref 0.0–1.0)

## 2012-12-29 LAB — WET PREP, GENITAL
Clue Cells Wet Prep HPF POC: NONE SEEN
Trich, Wet Prep: NONE SEEN

## 2012-12-29 LAB — URINE MICROSCOPIC-ADD ON

## 2012-12-29 MED ORDER — NYSTATIN-TRIAMCINOLONE 100000-0.1 UNIT/GM-% EX OINT: TOPICAL_OINTMENT | Freq: Two times a day (BID) | CUTANEOUS | Status: DC

## 2012-12-29 MED ORDER — LIDOCAINE HCL 2 % EX GEL
Freq: Once | CUTANEOUS | Status: DC
  Filled 2012-12-29: qty 20

## 2012-12-29 MED ORDER — NYSTATIN 100000 UNITS VA TABS: 1.0000 | ORAL_TABLET | Freq: Every day | VAGINAL | Status: DC

## 2012-12-29 NOTE — MAU Provider Note (Signed)
History     CSN: [id number]  Arrival date and time: 12/29/12 1330   None     Chief Complaint  Patient presents with  . Vaginal Pain   HPI This is a 47 y.o. female who presents with complaint of vaginal burning and irritation for one day.  No abnormal bleeding or discharge. No fever.   RN Note: Pt presents with complaints of vaginal pain and states it feels like fire. Pt had a hysterectomy on January the 23rd for ovarian cancer. States she had stage IV breast cancer 3 years ago and had lumpectomy.       OB History   Grav Para Term Preterm Abortions TAB SAB Ect Mult Living   4 4 3 1      4       Past Medical History  Diagnosis Date  . Hypertension   . Allergy   . GERD (gastroesophageal reflux disease)   . Thyroid disease   . Anemia     resolved 2011  . Hyperlipidemia     controlled  . Cancer 10/2009    breast  . History of blood transfusion 2009    [facility] -  UNKNOWN NUMBER OF UNITS TRANSFUSED    Past Surgical History  Procedure Laterality Date  . Cesarean section    . Wisdom tooth extraction    . Tubal ligation    . Breast surgery  10/2009    right lymp nodes removed  . Left foot surgery    . Abdominal hysterectomy  11/25/2012    Procedure: HYSTERECTOMY ABDOMINAL;  Surgeon: [name], MD;  Location: [facility];  Service: Gynecology;  Laterality: N/A;  with Bilateral Salpingoopherectomy and Cystoscopy    Family History  Problem Relation Age of Onset  . Diabetes Mother   . Hypertension Mother   . Diabetes Maternal Aunt   . Heart disease Maternal Aunt   . Hypertension Maternal Aunt   . Stroke Maternal Aunt   . Diabetes Maternal Grandmother   . Heart disease Maternal Grandmother   . Hypertension Maternal Grandmother   . Stroke Maternal Grandmother   . Diabetes Maternal Aunt   . Hypertension Maternal Aunt     History  Substance Use Topics  . Smoking status: Former Smoker -- 0.50 packs/day for 20 years    Types: Cigarettes    Quit date:  11/25/2012  . Smokeless tobacco: Never Used  . Alcohol Use: No    Allergies:  Allergies  Allergen Reactions  . Metronidazole Swelling    Prescriptions prior to admission  Medication Sig Dispense Refill  . amLODipine (NORVASC) 10 MG tablet Take by mouth daily.       Marland Kitchen anastrozole (ARIMIDEX) 1 MG tablet Take 1 tablet (1 mg total) by mouth daily.  30 tablet  12  . cloNIDine (CATAPRES) 0.2 MG tablet Take 0.2 mg by mouth at bedtime.       . docusate sodium (COLACE) 100 MG capsule Take 1 capsule (100 mg total) by mouth 2 (two) times daily as needed for constipation.  30 capsule  0  . losartan (COZAAR) 100 MG tablet Take by mouth daily.         Review of Systems  Constitutional: Negative for fever, chills and malaise/fatigue.  Gastrointestinal: Negative for nausea, vomiting and abdominal pain.  Genitourinary: Negative for dysuria.       Vaginal/perineal burning.    Physical Exam   Blood pressure 156/100, pulse 94, temperature 97.9 F (36.6 C), temperature source Oral, resp. rate 18,  height 5\' 2"  (1.575 m), weight 156 lb (70.761 kg), last menstrual period 11/12/2012.  Physical Exam  Constitutional: She is oriented to person, place, and time. She appears well-developed and well-nourished. No distress.  HENT:  Head: Normocephalic.  Cardiovascular: Normal rate.   Respiratory: Effort normal.  GI: Soft. She exhibits no distension. There is no tenderness.  Genitourinary: Vaginal discharge (thick white curdlike) found.  erethema of clitoral hood and labia  No obvious lesions Labial skin tender to touch   Musculoskeletal: Normal range of motion.  Neurological: She is alert and oriented to person, place, and time.  Skin: Skin is warm and dry.  Psychiatric: She has a normal mood and affect.    MAU Course  Procedures  MDM Wet prep done. Denies any history of HSV or recent intercourse.   Many Yeast seen on wet prep  Assessment and Plan  A:  Vaginal Burning      Yeast  vaginitis  P:  Allergic to [place].       Discussed with Dr Macon Large.  Will used Nystatin tabs vaginally and cream on outside    Medication List    TAKE these medications       amLODipine 10 MG tablet  Commonly known as:  NORVASC  Take by mouth daily.     anastrozole 1 MG tablet  Commonly known as:  ARIMIDEX  Take 1 tablet (1 mg total) by mouth daily.     cloNIDine 0.2 MG tablet  Commonly known as:  CATAPRES  Take 0.2 mg by mouth at bedtime.     docusate sodium 100 MG capsule  Commonly known as:  COLACE  Take 1 capsule (100 mg total) by mouth 2 (two) times daily as needed for constipation.     losartan 100 MG tablet  Commonly known as:  COZAAR  Take by mouth daily.     nystatin 100000 UNITS vaginal tablet  Place 1 tablet vaginally at bedtime.     nystatin-triamcinolone ointment  Commonly known as:  MYCOLOG  Apply topically 2 (two) times daily.         Wynelle Bourgeois 12/29/2012, 2:11 PM

## 2012-12-29 NOTE — MAU Note (Signed)
Pt presents with complaints of vaginal pain and states it feels like fire. Pt  had a hysterectomy on January the 23rd for ovarian cancer. States she had stage IV breast cancer 3 years ago and had lumpectomy.

## 2012-12-30 ENCOUNTER — Ambulatory Visit: Payer: 59 | Admitting: Obstetrics & Gynecology

## 2013-01-03 ENCOUNTER — Encounter: Payer: Self-pay | Admitting: Obstetrics & Gynecology

## 2013-01-03 ENCOUNTER — Ambulatory Visit (INDEPENDENT_AMBULATORY_CARE_PROVIDER_SITE_OTHER): Payer: 59 | Admitting: Obstetrics & Gynecology

## 2013-01-03 VITALS — BP 129/93 | HR 89 | Ht 61.0 in | Wt 155.5 lb

## 2013-01-03 DIAGNOSIS — B3731 Acute candidiasis of vulva and vagina: Secondary | ICD-10-CM

## 2013-01-03 DIAGNOSIS — B373 Candidiasis of vulva and vagina: Secondary | ICD-10-CM

## 2013-01-03 DIAGNOSIS — C7982 Secondary malignant neoplasm of genital organs: Secondary | ICD-10-CM

## 2013-01-03 DIAGNOSIS — D219 Benign neoplasm of connective and other soft tissue, unspecified: Secondary | ICD-10-CM

## 2013-01-03 DIAGNOSIS — D259 Leiomyoma of uterus, unspecified: Secondary | ICD-10-CM

## 2013-01-03 DIAGNOSIS — C801 Malignant (primary) neoplasm, unspecified: Secondary | ICD-10-CM

## 2013-01-03 NOTE — Patient Instructions (Signed)
Monilial Vaginitis Vaginitis in a soreness, swelling and redness (inflammation) of the vagina and vulva. Monilial vaginitis is not a sexually transmitted infection. CAUSES  Yeast vaginitis is caused by yeast (candida) that is normally found in your vagina. With a yeast infection, the candida has overgrown in number to a point that upsets the chemical balance. SYMPTOMS   White, thick vaginal discharge.  Swelling, itching, redness and irritation of the vagina and possibly the lips of the vagina (vulva).  Burning or painful urination.  Painful intercourse. DIAGNOSIS  Things that may contribute to monilial vaginitis are:  Postmenopausal and virginal states.  Pregnancy.  Infections.  Being tired, sick or stressed, especially if you had monilial vaginitis in the past.  Diabetes. Good control will help lower the chance.  Birth control pills.  Tight fitting garments.  Using bubble bath, feminine sprays, douches or deodorant tampons.  Taking certain medications that kill germs (antibiotics).  Sporadic recurrence can occur if you become ill. TREATMENT  Your caregiver will give you medication.  There are several kinds of anti monilial vaginal creams and suppositories specific for monilial vaginitis. For recurrent yeast infections, use a suppository or cream in the vagina 2 times a week, or as directed.  Anti-monilial or steroid cream for the itching or irritation of the vulva may also be used. Get your caregiver's permission.  Painting the vagina with methylene blue solution may help if the monilial cream does not work.  Eating yogurt may help prevent monilial vaginitis. HOME CARE INSTRUCTIONS   Finish all medication as prescribed.  Do not have sex until treatment is completed or after your caregiver tells you it is okay.  Take warm sitz baths.  Do not douche.  Do not use tampons, especially scented ones.  Wear cotton underwear.  Avoid tight pants and panty  hose.  Tell your sexual partner that you have a yeast infection. They should go to their caregiver if they have symptoms such as mild rash or itching.  Your sexual partner should be treated as well if your infection is difficult to eliminate.  Practice safer sex. Use condoms.  Some vaginal medications cause latex condoms to fail. Vaginal medications that harm condoms are:  Cleocin cream.  Butoconazole (Femstat).  Terconazole (Terazol) vaginal suppository.  Miconazole (Monistat) (may be purchased over the counter). SEEK MEDICAL CARE IF:   You have a temperature by mouth above 102 F (38.9 C).  The infection is getting worse after 2 days of treatment.  The infection is not getting better after 3 days of treatment.  You develop blisters in or around your vagina.  You develop vaginal bleeding, and it is not your menstrual period.  You have pain when you urinate.  You develop intestinal problems.  You have pain with sexual intercourse. Document Released: 07/30/2005 Document Revised: 01/12/2012 Document Reviewed: 04/13/2009 ExitCare Patient Information 2013 [employer]. Hysterectomy Care After Refer to this sheet in the next few weeks. These instructions provide you with information on caring for yourself after your procedure. Your caregiver may also give you more specific instructions. Your treatment has been planned according to current medical practices, but problems sometimes occur. Call your caregiver if you have any problems or questions after your procedure. HOME CARE INSTRUCTIONS  Healing will take time. You may have discomfort, tenderness, swelling, and bruising at the surgical site for about 2 weeks. This is normal and will get better as time goes on.  Only take over-the-counter or prescription medicines for pain, discomfort, or fever   as directed by your caregiver.  Do not take aspirin. It can cause bleeding.  Do not drive when taking pain medicine.  Follow  your caregiver's advice regarding exercise, lifting, driving, and general activities.  Resume your usual diet as directed and allowed.  Get plenty of rest and sleep.  Do not douche, use tampons, or have sexual intercourse for at least 6 weeks or until your caregiver gives you permission.  Change your bandages (dressings) as directed by your caregiver.  Monitor your temperature.  Take showers instead of baths for 2 to 3 weeks.  Do not drink alcohol until your caregiver gives you permission.  If you are constipated, you may take a mild laxative with your caregiver's permission. Bran foods may help with constipation problems. Drinking enough fluids to keep your urine clear or pale yellow may help as well.  Try to have someone home with you for 1 or 2 weeks to help around the house.  Keep all of your follow-up appointments as directed by your caregiver. SEEK MEDICAL CARE IF:   You have swelling, redness, or increasing pain in the surgical cut (incision) area.  You have pus coming from the incision.  You notice a bad smell coming from the incision or dressing.  You have swelling, redness, or pain around the intravenous (IV) site.  Your incision breaks open.  You feel dizzy or lightheaded.  You have pain or bleeding when you urinate.  You have persistent diarrhea.  You have persistent nausea and vomiting.  You have abnormal vaginal discharge.  You have a rash.  You have any type of abnormal reaction or develop an allergy to your medicine.  Your pain is not controlled with your prescribed medicine. SEEK IMMEDIATE MEDICAL CARE IF:   You have a fever.  You have severe abdominal pain.  You have chest pain.  You have shortness of breath.  You faint.  You have pain, swelling, or redness of your leg.  You have heavy vaginal bleeding with blood clots. MAKE SURE YOU:  Understand these instructions.  Will watch your condition.  Will get help right away if you are  not doing well or get worse. Document Released: 05/09/2005 Document Revised: 01/12/2012 Document Reviewed: 06/06/2011 ExitCare Patient Information 2013 [employer].  

## 2013-01-03 NOTE — MAU Provider Note (Signed)
Attestation of Attending Supervision of Advanced Practitioner (PA/CNM/NP): Evaluation and management procedures were performed by the Advanced Practitioner under my supervision and collaboration.  I have reviewed the Advanced Practitioner's note and chart, and I agree with the management and plan.  Lorijean Husser, MD, FACOG Attending Obstetrician & Gynecologist Faculty Practice, [facility]  

## 2013-01-03 NOTE — Progress Notes (Signed)
Subjective:     Patient ID: Tammy Blanchard, female   DOB: 1966/08/03, 47 y.o.   MRN: [id number]  HPI Pt is s/p TAH/BSO from 11/25/12.  Path significant for mets from breast to uterus, ovaries and cervix.  Pt s/p PET scan 12/2012 which revealed cystic lesions to the bones diffusely.  Pt denies pain in abd, pelvis or extremities.  She is almost back to full activity but, has not been sexually active and has not gone back to work.  She is voiding and stooling normally.  She has been treated for a UTI and yeast infxn since surgery.     Review of Systems     Objective:   Physical Exam BP 129/93  Pulse 89  Ht 5\' 1"  (1.549 m)  Wt 155 lb 8 oz (70.534 kg)  BMI 29.4 kg/m2  LMP 11/12/2012 Pt in NAD Lungs: CTA CV: RRR ABD: soft, NT, ND, incision well healed GU: EGBUS: no lesions Vagina: no blood in vault; cuff well healed- nodularity felt at the cuff feel more firm than expected. Tender at the cuff which is consistent with post op state  Adnexa: no masses; non tender       12/09/12 Comparison: CT chest dated 06/27/2009  Findings:  Neck: No hypermetabolic lymph nodes in the neck.  Chest: No hypermetabolic mediastinal or hilar nodes. Right  axillary lymph node dissection. 10 mm short-axis left axillary  node with preservation of the normal fatty hilum (series 2/image  68), max SUV 1.4.  No suspicious pulmonary nodules on the CT scan.  Abdomen/Pelvis: Stranding with associated hypermetabolism in the  anterior abdominal wall, max SUV 4.4 (PET image 192), postsurgical.  3.8 x 2.6 cm fluid collection in the left pelvic sidewall (series  2/image 193), non-FDG-avid, likely a postoperative seroma.  Additional postoperative stranding/fluid/hemorrhage in the pelvis.  Bilateral adnexal/pelvic soft tissue masses are present in this  patient with history of bilateral salpingo-oophorectomy. Left  pelvic mass measures 1.5 x 2.7 cm (series 2/image 177), max SUV  4.0. Right pelvic mass measures 2.4 x 2.2  cm (series 2/image 179),  max SUV 4.4.  No abnormal hypermetabolic activity within the liver, pancreas,  adrenal glands, or spleen. Severe hepatic steatosis.  Mild left hydronephrosis.  No hypermetabolic lymph nodes in the abdomen or pelvis.  Skeleton: No focal hypermetabolic activity to suggest skeletal  metastasis. However, there is diffuse sclerosis involving almost  the entire visualized axial and appendicular skeleton on CT.  IMPRESSION:  Postsurgical changes related to recent TAH BSO, as described above.  Please note that due to the short interval from surgery, evaluation  for underlying pelvic tumor is limited.  Bilateral adnexal/pelvic soft tissue masses, as described above,  max SUV 4.4. Given the clinical setting of recent BSO, these  findings are favored to be postsurgical. Consider follow-up pelvic  MRI with/without contrast in 3 months.  Suspected diffuse osseous metastases.  Mild left hydronephrosis.   Assessment:     6 week post op check Nodularity at surgical cuff will order CT of the pelvis to r/o recurrence of mets at the cuff     Plan:     Pelvic CT F/u 4 weeks or sooner prn BMP today

## 2013-01-04 LAB — BASIC METABOLIC PANEL
BUN: 9 mg/dL (ref 6–23)
Calcium: 10.5 mg/dL (ref 8.4–10.5)
Chloride: 106 mEq/L (ref 96–112)
Creat: 0.42 mg/dL — ABNORMAL LOW (ref 0.50–1.10)

## 2013-01-06 ENCOUNTER — Other Ambulatory Visit: Payer: Self-pay | Admitting: Obstetrics & Gynecology

## 2013-01-06 ENCOUNTER — Encounter (HOSPITAL_COMMUNITY): Payer: Self-pay | Admitting: Anesthesiology

## 2013-01-06 ENCOUNTER — Encounter (HOSPITAL_COMMUNITY): Payer: Self-pay

## 2013-01-06 ENCOUNTER — Telehealth: Payer: Self-pay | Admitting: Obstetrics & Gynecology

## 2013-01-06 ENCOUNTER — Ambulatory Visit (HOSPITAL_COMMUNITY)
Admission: RE | Admit: 2013-01-06 | Discharge: 2013-01-06 | Disposition: A | Discharge status: Home / Self Care | Payer: 59 | Source: Ambulatory Visit | Attending: Obstetrics & Gynecology | Admitting: Obstetrics & Gynecology

## 2013-01-06 DIAGNOSIS — N9489 Other specified conditions associated with female genital organs and menstrual cycle: Secondary | ICD-10-CM | POA: Insufficient documentation

## 2013-01-06 DIAGNOSIS — C7982 Secondary malignant neoplasm of genital organs: Secondary | ICD-10-CM | POA: Insufficient documentation

## 2013-01-06 DIAGNOSIS — Z90710 Acquired absence of both cervix and uterus: Secondary | ICD-10-CM | POA: Insufficient documentation

## 2013-01-06 DIAGNOSIS — I898 Other specified noninfective disorders of lymphatic vessels and lymph nodes: Secondary | ICD-10-CM | POA: Insufficient documentation

## 2013-01-06 DIAGNOSIS — C50919 Malignant neoplasm of unspecified site of unspecified female breast: Secondary | ICD-10-CM | POA: Insufficient documentation

## 2013-01-06 MED ORDER — IOHEXOL 300 MG/ML  SOLN
100.0000 mL | Freq: Once | INTRAMUSCULAR | Status: AC | PRN
  Administered 2013-01-06: 100 mL via INTRAVENOUS

## 2013-01-06 NOTE — Telephone Encounter (Signed)
Pt had CT of pelvis to check for mass at the vaginal cuff.  There was no mass effect noted.  I notified pt of this.  She is aware of the bone mets and is having no pain.  All of her questions were answered to her satisfaction.

## 2013-01-21 ENCOUNTER — Other Ambulatory Visit: Payer: 59 | Admitting: Lab

## 2013-01-21 ENCOUNTER — Ambulatory Visit: Payer: 59 | Admitting: Physician Assistant

## 2013-01-31 ENCOUNTER — Ambulatory Visit: Payer: 59 | Admitting: Obstetrics & Gynecology

## 2013-02-15 ENCOUNTER — Other Ambulatory Visit (HOSPITAL_BASED_OUTPATIENT_CLINIC_OR_DEPARTMENT_OTHER): Payer: 59 | Admitting: Lab

## 2013-02-15 ENCOUNTER — Ambulatory Visit (HOSPITAL_BASED_OUTPATIENT_CLINIC_OR_DEPARTMENT_OTHER): Payer: 59 | Admitting: Physician Assistant

## 2013-02-15 ENCOUNTER — Encounter: Payer: Self-pay | Admitting: Physician Assistant

## 2013-02-15 ENCOUNTER — Telehealth: Payer: Self-pay | Admitting: *Deleted

## 2013-02-15 VITALS — BP 121/86 | HR 105 | Temp 99.0°F | Resp 20 | Ht 61.0 in | Wt 161.2 lb

## 2013-02-15 DIAGNOSIS — N649 Disorder of breast, unspecified: Secondary | ICD-10-CM

## 2013-02-15 DIAGNOSIS — C50911 Malignant neoplasm of unspecified site of right female breast: Secondary | ICD-10-CM

## 2013-02-15 DIAGNOSIS — C50419 Malignant neoplasm of upper-outer quadrant of unspecified female breast: Secondary | ICD-10-CM

## 2013-02-15 DIAGNOSIS — C50919 Malignant neoplasm of unspecified site of unspecified female breast: Secondary | ICD-10-CM

## 2013-02-15 DIAGNOSIS — N6459 Other signs and symptoms in breast: Secondary | ICD-10-CM

## 2013-02-15 DIAGNOSIS — C7982 Secondary malignant neoplasm of genital organs: Secondary | ICD-10-CM

## 2013-02-15 DIAGNOSIS — L989 Disorder of the skin and subcutaneous tissue, unspecified: Secondary | ICD-10-CM

## 2013-02-15 DIAGNOSIS — L988 Other specified disorders of the skin and subcutaneous tissue: Secondary | ICD-10-CM

## 2013-02-15 LAB — COMPREHENSIVE METABOLIC PANEL (CC13)
Albumin: 3.9 g/dL (ref 3.5–5.0)
Alkaline Phosphatase: 257 U/L — ABNORMAL HIGH (ref 40–150)
BUN: 11 mg/dL (ref 7.0–26.0)
CO2: 26 mEq/L (ref 22–29)
Calcium: 10.1 mg/dL (ref 8.4–10.4)
Chloride: 105 mEq/L (ref 98–107)
Glucose: 95 mg/dl (ref 70–99)
Potassium: 3.5 mEq/L (ref 3.5–5.1)
Sodium: 141 mEq/L (ref 136–145)
Total Protein: 7.8 g/dL (ref 6.4–8.3)

## 2013-02-15 LAB — CBC WITH DIFFERENTIAL/PLATELET
Basophils Absolute: 0 10*3/uL (ref 0.0–0.1)
Eosinophils Absolute: 0.3 10*3/uL (ref 0.0–0.5)
HGB: 12.4 g/dL (ref 11.6–15.9)
MCV: 85.3 fL (ref 79.5–101.0)
MONO#: 0.4 10*3/uL (ref 0.1–0.9)
NEUT#: 3.1 10*3/uL (ref 1.5–6.5)
RBC: 4.35 10*6/uL (ref 3.70–5.45)
RDW: 15 % — ABNORMAL HIGH (ref 11.2–14.5)
WBC: 6.3 10*3/uL (ref 3.9–10.3)
lymph#: 2.4 10*3/uL (ref 0.9–3.3)

## 2013-02-15 MED ORDER — LETROZOLE 2.5 MG PO TABS: 2.5000 mg | ORAL_TABLET | Freq: Every day | ORAL | Status: DC

## 2013-02-15 NOTE — Telephone Encounter (Signed)
gv appt for AGB w/ a lab for 03/29/13, gv appt for DR. Toth, and appts the [facility]...td
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
## 2013-02-17 ENCOUNTER — Other Ambulatory Visit: Payer: Self-pay | Admitting: Oncology

## 2013-02-18 ENCOUNTER — Telehealth: Payer: Self-pay | Admitting: *Deleted

## 2013-02-18 NOTE — Telephone Encounter (Signed)
Lm gave pt appt d/t for 03/29/13. i also informed her that i will mail her a letter/cal as well...td

## 2013-02-18 NOTE — Telephone Encounter (Signed)
Per staff message and POF I have scheduled appts.  JMW  

## 2013-03-02 ENCOUNTER — Other Ambulatory Visit: Payer: 59

## 2013-03-14 ENCOUNTER — Encounter (INDEPENDENT_AMBULATORY_CARE_PROVIDER_SITE_OTHER): Payer: Self-pay | Admitting: General Surgery

## 2013-03-29 ENCOUNTER — Ambulatory Visit (HOSPITAL_BASED_OUTPATIENT_CLINIC_OR_DEPARTMENT_OTHER): Payer: 59

## 2013-03-29 ENCOUNTER — Other Ambulatory Visit (HOSPITAL_BASED_OUTPATIENT_CLINIC_OR_DEPARTMENT_OTHER): Payer: 59 | Admitting: Lab

## 2013-03-29 ENCOUNTER — Encounter: Payer: 59 | Admitting: Physician Assistant

## 2013-03-29 VITALS — BP 132/89 | HR 91 | Temp 98.8°F | Resp 20 | Ht 61.0 in | Wt 162.3 lb

## 2013-03-29 DIAGNOSIS — C50911 Malignant neoplasm of unspecified site of right female breast: Secondary | ICD-10-CM

## 2013-03-29 DIAGNOSIS — C800 Disseminated malignant neoplasm, unspecified: Secondary | ICD-10-CM

## 2013-03-29 DIAGNOSIS — M899 Disorder of bone, unspecified: Secondary | ICD-10-CM

## 2013-03-29 DIAGNOSIS — C50919 Malignant neoplasm of unspecified site of unspecified female breast: Secondary | ICD-10-CM

## 2013-03-29 LAB — CBC WITH DIFFERENTIAL/PLATELET
BASO%: 0.6 % (ref 0.0–2.0)
Basophils Absolute: 0 10*3/uL (ref 0.0–0.1)
Eosinophils Absolute: 0.2 10*3/uL (ref 0.0–0.5)
HGB: 12.1 g/dL (ref 11.6–15.9)
LYMPH%: 43.8 % (ref 14.0–49.7)
MCV: 84.3 fL (ref 79.5–101.0)
NEUT#: 2.1 10*3/uL (ref 1.5–6.5)
RBC: 4.33 10*6/uL (ref 3.70–5.45)
RDW: 14.8 % — ABNORMAL HIGH (ref 11.2–14.5)
lymph#: 2.2 10*3/uL (ref 0.9–3.3)

## 2013-03-29 MED ORDER — ZOLEDRONIC ACID 4 MG/100ML IV SOLN
4.0000 mg | Freq: Once | INTRAVENOUS | Status: AC
  Administered 2013-03-29: 4 mg via INTRAVENOUS
  Filled 2013-03-29: qty 100

## 2013-03-29 MED ORDER — SODIUM CHLORIDE 0.9 % IV SOLN
Freq: Once | INTRAVENOUS | Status: AC
  Administered 2013-03-29: 17:00:00 via INTRAVENOUS

## 2013-03-29 NOTE — Patient Instructions (Signed)
Zoledronic Acid injection (Hypercalcemia, Oncology) What is this medicine? ZOLEDRONIC ACID (ZOE le dron ik AS id) lowers the amount of calcium loss from bone. It is used to treat too much calcium in your blood from cancer. It is also used to prevent complications of cancer that has spread to the bone. This medicine may be used for other purposes; ask your health care provider or pharmacist if you have questions. What should I tell my health care provider before I take this medicine? They need to know if you have any of these conditions: -aspirin-sensitive asthma -dental disease -kidney disease -an unusual or allergic reaction to zoledronic acid, other medicines, foods, dyes, or preservatives -pregnant or trying to get pregnant -breast-feeding How should I use this medicine? This medicine is for infusion into a vein. It is given by a health care professional in a hospital or clinic setting. Talk to your pediatrician regarding the use of this medicine in children. Special care may be needed. Overdosage: If you think you have taken too much of this medicine contact a poison control center or emergency room at once. NOTE: This medicine is only for you. Do not share this medicine with others. What if I miss a dose? It is important not to miss your dose. Call your doctor or health care professional if you are unable to keep an appointment. What may interact with this medicine? -certain antibiotics given by injection -NSAIDs, medicines for pain and inflammation, like ibuprofen or naproxen -some diuretics like bumetanide, furosemide -teriparatide -thalidomide This list may not describe all possible interactions. Give your health care provider a list of all the medicines, herbs, non-prescription drugs, or dietary supplements you use. Also tell them if you smoke, drink alcohol, or use illegal drugs. Some items may interact with your medicine. What should I watch for while using this medicine? Visit  your doctor or health care professional for regular checkups. It may be some time before you see the benefit from this medicine. Do not stop taking your medicine unless your doctor tells you to. Your doctor may order blood tests or other tests to see how you are doing. Women should inform their doctor if they wish to become pregnant or think they might be pregnant. There is a potential for serious side effects to an unborn child. Talk to your health care professional or pharmacist for more information. You should make sure that you get enough calcium and vitamin D while you are taking this medicine. Discuss the foods you eat and the vitamins you take with your health care professional. Some people who take this medicine have severe bone, joint, and/or muscle pain. This medicine may also increase your risk for a broken thigh bone. Tell your doctor right away if you have pain in your upper leg or groin. Tell your doctor if you have any pain that does not go away or that gets worse. What side effects may I notice from receiving this medicine? Side effects that you should report to your doctor or health care professional as soon as possible: -allergic reactions like skin rash, itching or hives, swelling of the face, lips, or tongue -anxiety, confusion, or depression -breathing problems -changes in vision -feeling faint or lightheaded, falls -jaw burning, cramping, pain -muscle cramps, stiffness, or weakness -trouble passing urine or change in the amount of urine Side effects that usually do not require medical attention (report to your doctor or health care professional if they continue or are bothersome): -bone, joint, or muscle pain -  fever -hair loss -irritation at site where injected -loss of appetite -nausea, vomiting -stomach upset -tired This list may not describe all possible side effects. Call your doctor for medical advice about side effects. You may report side effects to FDA at  1-800-FDA-1088. Where should I keep my medicine? This drug is given in a hospital or clinic and will not be stored at home. NOTE: This sheet is a summary. It may not cover all possible information. If you have questions about this medicine, talk to your doctor, pharmacist, or health care provider.  2012, Elsevier/Gold Standard. (04/18/2011 9:06:58 AM) 

## 2013-03-29 NOTE — Progress Notes (Signed)
Patient was seen by Dr. Darnelle Catalan for her office visit today.  Zollie Scale, PA-C 03/29/2013
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
## 2013-03-30 ENCOUNTER — Telehealth: Payer: Self-pay | Admitting: Oncology

## 2013-03-30 ENCOUNTER — Other Ambulatory Visit: Payer: Self-pay | Admitting: *Deleted

## 2013-03-30 ENCOUNTER — Telehealth: Payer: Self-pay | Admitting: *Deleted

## 2013-03-30 ENCOUNTER — Other Ambulatory Visit (HOSPITAL_COMMUNITY): Payer: Self-pay | Admitting: Oncology

## 2013-03-30 NOTE — Telephone Encounter (Signed)
, °

## 2013-03-30 NOTE — Telephone Encounter (Signed)
Lm informing the Tammy Blanchard that [facility] would like for her see Dr.Toth. gv appt d/t for 04/08/13 @4pm . i made the Tammy Blanchard aware that she needs to arrive at 3:30pm....td

## 2013-03-30 NOTE — Telephone Encounter (Signed)
i also will be mailing a letter/ AVS to show her appt w/Dr. Carolynne Edouard...td

## 2013-03-31 ENCOUNTER — Encounter: Payer: Self-pay | Admitting: *Deleted

## 2013-03-31 NOTE — Progress Notes (Signed)
On yesterday I spoke with Ms. Zeck regarding how to proceed with pursuing disability. I provided her with the phone number and location of the local [employer] and prepped her for this process. She thanked me and stated she would follow up.   Gretta Cool, LCSW Assistant Director Clinical Social Work

## 2013-04-08 ENCOUNTER — Encounter (INDEPENDENT_AMBULATORY_CARE_PROVIDER_SITE_OTHER): Payer: 59 | Admitting: General Surgery

## 2013-04-11 ENCOUNTER — Telehealth (INDEPENDENT_AMBULATORY_CARE_PROVIDER_SITE_OTHER): Payer: Self-pay

## 2013-04-11 ENCOUNTER — Other Ambulatory Visit (INDEPENDENT_AMBULATORY_CARE_PROVIDER_SITE_OTHER): Payer: Self-pay | Admitting: General Surgery

## 2013-04-11 ENCOUNTER — Ambulatory Visit (INDEPENDENT_AMBULATORY_CARE_PROVIDER_SITE_OTHER): Payer: Medicaid Other | Admitting: General Surgery

## 2013-04-11 ENCOUNTER — Encounter (INDEPENDENT_AMBULATORY_CARE_PROVIDER_SITE_OTHER): Payer: Self-pay | Admitting: General Surgery

## 2013-04-11 DIAGNOSIS — C50911 Malignant neoplasm of unspecified site of right female breast: Secondary | ICD-10-CM

## 2013-04-11 DIAGNOSIS — C50919 Malignant neoplasm of unspecified site of unspecified female breast: Secondary | ICD-10-CM

## 2013-04-11 NOTE — Telephone Encounter (Signed)
LMOV Dr. Carolynne Edouard still delayed at the hospital.  Her appt for 2:20 is still scheduled, but she may have a short wait time.

## 2013-04-11 NOTE — Patient Instructions (Signed)
Will plan to get MRI breast

## 2013-04-11 NOTE — Progress Notes (Signed)
Subjective:     Patient ID: Tammy Blanchard, female   DOB: 1966-04-11, 47 y.o.   MRN: [id number]  HPI The patient is a 47 year old black female who had a locally advanced right breast cancer 3 years ago. She was treated with neoadjuvant therapy and eventually underwent a lumpectomy and axillary lymph node dissection in which she had 3 positive lymph nodes. She did not come back for a followup. Apparently she underwent a hysterectomy and oophorectomy in January of this year and her pathology revealed metastatic breast cancer in her uterus. At about that same time she also noticed some nodules that have come up on the skin of her right breast in her abdominal wall. She also has some similar nodules on her scalp and neck. Her last mammogram was done in October and did not show evidence of recurrent malignancy. She is currently being treated with letrozole.  Review of Systems  Constitutional: Negative.   HENT: Negative.   Eyes: Negative.   Respiratory: Negative.   Cardiovascular: Negative.   Gastrointestinal: Negative.   Endocrine: Negative.   Genitourinary: Negative.   Musculoskeletal: Negative.   Skin: Positive for rash.  Allergic/Immunologic: Negative.   Neurological: Negative.   Hematological: Negative.   Psychiatric/Behavioral: Negative.        Objective:   Physical Exam  Constitutional: She is oriented to person, place, and time. She appears well-developed and well-nourished.  HENT:  Head: Normocephalic and atraumatic.  Eyes: Conjunctivae and EOM are normal. Pupils are equal, round, and reactive to light.  Neck: Normal range of motion. Neck supple.  There are a couple small nodules on the left neck and the right scalp behind the ear  Cardiovascular: Normal rate, regular rhythm and normal heart sounds.   Pulmonary/Chest: Effort normal and breath sounds normal.  She has multiple small nodules in the skin of the right breast. There is retraction of the right nipple and some firm  fullness centrally in the right breast. There is no palpable mass in the left breast. There is no palpable axillary or supraclavicular cervical lymphadenopathy  Abdominal: Soft. Bowel sounds are normal.  There is a small nodule of the mid abdominal wall skin.  Musculoskeletal: Normal range of motion.  Neurological: She is alert and oriented to person, place, and time.  Skin: Skin is warm and dry.  Psychiatric: She has a normal mood and affect. Her behavior is normal.       Assessment:     The patient has apparently developed stage IV breast cancer with metastases to her uterus. There is some question as to whether she has recurrent disease in the right breast with metastatic skin nodule of the right breast and abdominal wall     Plan:     At this point I would recommend beginning with mammogram and ultrasound and MRI imaging of the right breast. She may need biopsies of both the breast and the skin nodules. We will plan to see her back in 2 weeks. We will plan to call her with the results of her MRI

## 2013-04-13 ENCOUNTER — Telehealth (INDEPENDENT_AMBULATORY_CARE_PROVIDER_SITE_OTHER): Payer: Self-pay | Admitting: General Surgery

## 2013-04-13 NOTE — Telephone Encounter (Signed)
Message copied by Liliana Cline on Wed Apr 13, 2013 12:11 PM ------      Message from: Osborne Oman      Created: Wed Apr 13, 2013 10:56 AM       I will change the location to [facility] and let Sheralyn Boatman know to set it up.      ----- Message -----         From: Liliana Cline, CMA         Sent: 04/13/2013   9:56 AM           To: Osborne Oman            I am not sure- if we don't have a current card in the system I would imagine we don't. Should I call [facility]?             Lesly Rubenstein      ----- Message -----         From: Osborne Oman         Sent: 04/12/2013   8:54 AM           To: Liliana Cline, CMA            Clois Montavon,      If this patient only has [employer], she should be scheduled at [facility].       She used to have [employer]. Does she still have this insurance? Thanks.      Olegario Messier            ----- Message -----         From: Liliana Cline, CMA         Sent: 04/11/2013   4:15 PM           To: Osborne Oman            Order just placed for MR breast      Patient is scheduled for Mgm and [place] on 04/25/2013       Please let me know once you have a date for MR            Thanks- Calais Svehla                   ------

## 2013-04-13 NOTE — Telephone Encounter (Signed)
MR scheduled 04/25/2013 at 1:00 pm.

## 2013-04-13 NOTE — Telephone Encounter (Signed)
MR has been forwarded to [facility] at [facility] to schedule patient's MR appt. Awaiting date.

## 2013-04-14 ENCOUNTER — Other Ambulatory Visit: Payer: Self-pay | Admitting: *Deleted

## 2013-04-14 DIAGNOSIS — C50919 Malignant neoplasm of unspecified site of unspecified female breast: Secondary | ICD-10-CM

## 2013-04-14 MED ORDER — CLONIDINE HCL 0.2 MG PO TABS: 0.2000 mg | ORAL_TABLET | Freq: Every day | ORAL | Status: DC

## 2013-04-14 MED ORDER — AMLODIPINE BESYLATE 10 MG PO TABS: 10.0000 mg | ORAL_TABLET | Freq: Every day | ORAL | Status: DC

## 2013-04-18 ENCOUNTER — Ambulatory Visit: Payer: No Typology Code available for payment source | Attending: Family Medicine | Admitting: Internal Medicine

## 2013-04-18 VITALS — BP 152/98 | HR 90 | Temp 99.3°F | Resp 16 | Ht 63.0 in | Wt 158.0 lb

## 2013-04-18 DIAGNOSIS — I10 Essential (primary) hypertension: Secondary | ICD-10-CM | POA: Insufficient documentation

## 2013-04-18 DIAGNOSIS — C50919 Malignant neoplasm of unspecified site of unspecified female breast: Secondary | ICD-10-CM | POA: Insufficient documentation

## 2013-04-18 DIAGNOSIS — C7951 Secondary malignant neoplasm of bone: Secondary | ICD-10-CM | POA: Insufficient documentation

## 2013-04-18 MED ORDER — LOSARTAN POTASSIUM 100 MG PO TABS: 100.0000 mg | ORAL_TABLET | Freq: Every day | ORAL | Status: DC

## 2013-04-18 MED ORDER — CLONIDINE HCL 0.2 MG PO TABS: 0.2000 mg | ORAL_TABLET | Freq: Every day | ORAL | Status: DC

## 2013-04-18 MED ORDER — AMLODIPINE BESYLATE 10 MG PO TABS: 10.0000 mg | ORAL_TABLET | Freq: Every day | ORAL | Status: DC

## 2013-04-18 NOTE — Progress Notes (Signed)
Patient is here to establish care Has a history of stage 4 breast cancer metastasized to  Uterus and bones Takes medication for hypertension

## 2013-04-18 NOTE — Progress Notes (Signed)
Patient ID: Tammy Blanchard, female   DOB: 1966/01/22, 47 y.o.   MRN: [id number] Patient Demographics  Sybella Harnish, is a 47 y.o. female  WUJ:[id number]  [id number]  DOB - [birth year]  Chief Complaint  Patient presents with  . Establish Care        Subjective:   Arlette Schaad history of metastatic breast cancer to the ground and uterus, status post lumpectomy,TAH-BSO, who follows with Dr. Darnelle Catalan and Dr.Tooth, here to establish care, she also has hypertension and is out of her blood pressure medications, she needs refill for the same. She has no subjective complaints today.  Objective:   Past Medical History  Diagnosis Date  . Hypertension   . Allergy   . GERD (gastroesophageal reflux disease)   . Thyroid disease   . Anemia     resolved 2011  . Hyperlipidemia     controlled  . Cancer 10/2009    breast  . History of blood transfusion 2009    [facility] -  UNKNOWN NUMBER OF UNITS TRANSFUSED      Past Surgical History  Procedure Laterality Date  . Cesarean section    . Wisdom tooth extraction    . Tubal ligation    . Breast surgery  10/2009    right lymp nodes removed  . Left foot surgery    . Abdominal hysterectomy  11/25/2012    Procedure: HYSTERECTOMY ABDOMINAL;  Surgeon: [name], MD;  Location: [facility];  Service: Gynecology;  Laterality: N/A;  with Bilateral Salpingoopherectomy and Cystoscopy     Filed Vitals:   04/18/13 1314  BP: 152/98  Pulse: 90  Temp: 99.3 F (37.4 C)  Resp: 16  Height: 5\' 3"  (1.6 m)  Weight: 158 lb (71.668 kg)  SpO2: 99%     Exam  Awake Alert, Oriented X 3, No new F.N deficits, Normal affect [place].AT,PERRAL Supple Neck,No JVD, No cervical lymphadenopathy appriciated.  Symmetrical Chest wall movement, Good air movement bilaterally, CTAB RRR,No Gallops,Rubs or new Murmurs, No Parasternal Heave +ve B.Sounds, Abd Soft, Non tender, No organomegaly appriciated, No rebound - guarding or rigidity. No Cyanosis, Clubbing or edema,  No new Rash or bruise       Data Review   CBC No results found for this basename: WBC, HGB, HCT, PLT, MCV, MCH, MCHC, RDW, NEUTRABS, LYMPHSABS, MONOABS, EOSABS, BASOSABS, BANDABS, BANDSABD,  in the last 168 hours  Chemistries   No results found for this basename: NA, K, CL, CO2, GLUCOSE, BUN, CREATININE, GFRCGP, CALCIUM, MG, AST, ALT, ALKPHOS, BILITOT,  in the last 168 hours ------------------------------------------------------------------------------------------------------------------ No results found for this basename: HGBA1C,  in the last 72 hours ------------------------------------------------------------------------------------------------------------------ No results found for this basename: CHOL, HDL, LDLCALC, TRIG, CHOLHDL, LDLDIRECT,  in the last 72 hours ------------------------------------------------------------------------------------------------------------------ No results found for this basename: TSH, T4TOTAL, FREET3, T3FREE, THYROIDAB,  in the last 72 hours ------------------------------------------------------------------------------------------------------------------ No results found for this basename: VITAMINB12, FOLATE, FERRITIN, TIBC, IRON, RETICCTPCT,  in the last 72 hours  Coagulation profile  No results found for this basename: INR, PROTIME,  in the last 168 hours     Prior to Admission medications   Medication Sig Start Date End Date Taking? Authorizing Provider  letrozole (FEMARA) 2.5 MG tablet Take 2.5 mg by mouth daily.   Yes Historical Provider, MD  amLODipine (NORVASC) 10 MG tablet Take 1 tablet (10 mg total) by mouth daily. 04/18/13   Leroy Sea, MD  anastrozole (ARIMIDEX) 1 MG tablet  01/22/13   Historical Provider, MD  calcium carbonate (TUMS - DOSED IN MG ELEMENTAL CALCIUM) 500 MG chewable tablet Chew 1 tablet by mouth daily. 03/29/13   Lowella Dell, MD  cloNIDine (CATAPRES) 0.2 MG tablet Take 1 tablet (0.2 mg total) by mouth at  bedtime. 04/18/13   Leroy Sea, MD  docusate sodium (COLACE) 100 MG capsule Take 1 capsule (100 mg total) by mouth 2 (two) times daily as needed for constipation. 11/28/12   [name], MD  HYDROcodone-acetaminophen (NORCO/VICODIN) 5-325 MG per tablet  03/17/13   Historical Provider, MD  losartan (COZAAR) 100 MG tablet Take 1 tablet (100 mg total) by mouth daily. 04/18/13   Leroy Sea, MD  nystatin-triamcinolone ointment [place])  12/29/12   Historical Provider, MD     Assessment & Plan   1. Breast Cancer with metastasis to bone uterus follows with Dr. Darnelle Catalan. Her breast cancer history is as below  (1) status post bilateral breast biopsies in August 2010. On the left, only atypical ductal hyperplasia. On the right, high-grade invasive ductal carcinoma, 4.6 cm by MRI.  (2) Treated in the neoadjuvant setting with docetaxel, doxorubicin, and cyclophosphamide x6, chemotherapy completed in December 2010.  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 Three of 10 lymph nodes were positive. Tumor was strongly estrogen receptor/progesterone receptor positive, HER2/neu negative with high proliferation fraction.  (4) Received radiation therapy, completed May 2011,  (5) on tamoxifen May 2011 to January 2014 when she was found to have stage IV disease  (6) s/p TAH-BSO 11/25/2012 with metastatic brast cancer, estrogen receptor 30% and progestrerone receptor 20% positive, HER-2 negative  (7) anastrozole started February 2014    2. Hypertension. She is out of her hypertension medications for a little while, all 3 of her antihypertensive medications have been resumed and her previous dose. She will come back in a month. Will followup.   3. History of smoking counseled to quit smoking.    Leroy Sea M.D on 04/18/2013 at 1:20 PM

## 2013-04-20 ENCOUNTER — Telehealth: Payer: Self-pay

## 2013-04-20 NOTE — Telephone Encounter (Signed)
Received call from pt stating that her current pharmacy, [facility] on [address], does not have letrozole and is unable to get it for her.  Pt is currently out of the medication.  Pt states she has an orange card to obtain medications through [facility].  Informed her will speak with financial counselors to see if pt qualifies for assistance, and office will call her back.  Pt verbalizes understanding.  Information given to Raquel, Artist, who will follow up and contact pt, and let us know when we can call in prescription for pt

## 2013-04-21 ENCOUNTER — Other Ambulatory Visit: Payer: Self-pay | Admitting: *Deleted

## 2013-04-21 ENCOUNTER — Telehealth: Payer: Self-pay | Admitting: Family Medicine

## 2013-04-21 ENCOUNTER — Encounter: Payer: Self-pay | Admitting: Oncology

## 2013-04-21 MED ORDER — LETROZOLE 2.5 MG PO TABS: 2.5000 mg | ORAL_TABLET | Freq: Every day | ORAL | Status: DC

## 2013-04-21 MED ORDER — ANASTROZOLE 1 MG PO TABS: 1.0000 mg | ORAL_TABLET | Freq: Every day | ORAL | Status: DC

## 2013-04-21 NOTE — Progress Notes (Signed)
Patient came in and signed the grants for [facility] and [address]. She is taking grant letter for meds to [email or web address]now. She had bank statements and proof of child support. It is back support that she his getting paid. Very nice young lady, she is scared but I advised I am here if she needed me.

## 2013-04-21 NOTE — Progress Notes (Signed)
Called the patient back. I advised we could help with her med Letrozole. She will bring in her income and bank statement for [facility] and [employer]. I explained how both work and what I needed. She became upset--Happy tears!! I called and left message for nurse and will take over to get meds called into [facility]. She has applied for disability and still waiting. No income other than 218 child support each month.

## 2013-04-21 NOTE — Telephone Encounter (Signed)
Pt would like [place] access Medicaid form filled out by provider, form at front desk.

## 2013-04-25 ENCOUNTER — Ambulatory Visit
Admission: RE | Admit: 2013-04-25 | Discharge: 2013-04-25 | Disposition: A | Discharge status: Home / Self Care | Payer: Medicaid Other | Source: Ambulatory Visit | Attending: General Surgery | Admitting: General Surgery

## 2013-04-25 ENCOUNTER — Encounter (INDEPENDENT_AMBULATORY_CARE_PROVIDER_SITE_OTHER): Payer: 59 | Admitting: General Surgery

## 2013-04-25 ENCOUNTER — Ambulatory Visit (HOSPITAL_COMMUNITY)
Admission: RE | Admit: 2013-04-25 | Discharge: 2013-04-25 | Disposition: A | Discharge status: Home / Self Care | Payer: No Typology Code available for payment source | Source: Ambulatory Visit | Attending: General Surgery | Admitting: General Surgery

## 2013-04-25 ENCOUNTER — Other Ambulatory Visit (INDEPENDENT_AMBULATORY_CARE_PROVIDER_SITE_OTHER): Payer: Self-pay | Admitting: General Surgery

## 2013-04-25 DIAGNOSIS — C7982 Secondary malignant neoplasm of genital organs: Secondary | ICD-10-CM | POA: Insufficient documentation

## 2013-04-25 DIAGNOSIS — C50911 Malignant neoplasm of unspecified site of right female breast: Secondary | ICD-10-CM

## 2013-04-25 DIAGNOSIS — Z9221 Personal history of antineoplastic chemotherapy: Secondary | ICD-10-CM | POA: Insufficient documentation

## 2013-04-25 DIAGNOSIS — C50919 Malignant neoplasm of unspecified site of unspecified female breast: Secondary | ICD-10-CM | POA: Insufficient documentation

## 2013-04-25 DIAGNOSIS — Z923 Personal history of irradiation: Secondary | ICD-10-CM | POA: Insufficient documentation

## 2013-04-25 DIAGNOSIS — R599 Enlarged lymph nodes, unspecified: Secondary | ICD-10-CM | POA: Insufficient documentation

## 2013-04-25 DIAGNOSIS — Z90710 Acquired absence of both cervix and uterus: Secondary | ICD-10-CM | POA: Insufficient documentation

## 2013-04-25 DIAGNOSIS — C7960 Secondary malignant neoplasm of unspecified ovary: Secondary | ICD-10-CM | POA: Insufficient documentation

## 2013-04-25 DIAGNOSIS — R229 Localized swelling, mass and lump, unspecified: Secondary | ICD-10-CM | POA: Insufficient documentation

## 2013-04-25 DIAGNOSIS — N6459 Other signs and symptoms in breast: Secondary | ICD-10-CM | POA: Insufficient documentation

## 2013-04-25 DIAGNOSIS — R928 Other abnormal and inconclusive findings on diagnostic imaging of breast: Secondary | ICD-10-CM | POA: Insufficient documentation

## 2013-04-25 MED ORDER — GADOBENATE DIMEGLUMINE 529 MG/ML IV SOLN
15.0000 mL | Freq: Once | INTRAVENOUS | Status: AC | PRN
  Administered 2013-04-25: 15 mL via INTRAVENOUS

## 2013-04-26 ENCOUNTER — Other Ambulatory Visit: Payer: 59 | Admitting: Lab

## 2013-04-26 ENCOUNTER — Telehealth: Payer: Self-pay | Admitting: *Deleted

## 2013-04-26 ENCOUNTER — Ambulatory Visit: Payer: Medicaid Other | Admitting: Physician Assistant

## 2013-04-26 ENCOUNTER — Other Ambulatory Visit (HOSPITAL_BASED_OUTPATIENT_CLINIC_OR_DEPARTMENT_OTHER): Payer: Medicaid Other | Admitting: Lab

## 2013-04-26 ENCOUNTER — Ambulatory Visit (HOSPITAL_BASED_OUTPATIENT_CLINIC_OR_DEPARTMENT_OTHER): Payer: Medicaid Other | Admitting: Oncology

## 2013-04-26 ENCOUNTER — Other Ambulatory Visit (INDEPENDENT_AMBULATORY_CARE_PROVIDER_SITE_OTHER): Payer: Self-pay | Admitting: General Surgery

## 2013-04-26 ENCOUNTER — Ambulatory Visit (HOSPITAL_BASED_OUTPATIENT_CLINIC_OR_DEPARTMENT_OTHER): Payer: Medicaid Other

## 2013-04-26 VITALS — BP 149/99 | HR 89 | Temp 98.8°F | Resp 20 | Ht 63.0 in | Wt 159.7 lb

## 2013-04-26 DIAGNOSIS — C50911 Malignant neoplasm of unspecified site of right female breast: Secondary | ICD-10-CM

## 2013-04-26 DIAGNOSIS — C7951 Secondary malignant neoplasm of bone: Secondary | ICD-10-CM

## 2013-04-26 DIAGNOSIS — C50419 Malignant neoplasm of upper-outer quadrant of unspecified female breast: Secondary | ICD-10-CM

## 2013-04-26 DIAGNOSIS — R928 Other abnormal and inconclusive findings on diagnostic imaging of breast: Secondary | ICD-10-CM

## 2013-04-26 DIAGNOSIS — Z170 Estrogen receptor positive status [ER+]: Secondary | ICD-10-CM

## 2013-04-26 DIAGNOSIS — C50919 Malignant neoplasm of unspecified site of unspecified female breast: Secondary | ICD-10-CM

## 2013-04-26 DIAGNOSIS — R229 Localized swelling, mass and lump, unspecified: Secondary | ICD-10-CM

## 2013-04-26 LAB — COMPREHENSIVE METABOLIC PANEL (CC13)
ALT: 51 U/L (ref 0–55)
AST: 27 U/L (ref 5–34)
Albumin: 4 g/dL (ref 3.5–5.0)
BUN: 8.7 mg/dL (ref 7.0–26.0)
CO2: 21 mEq/L — ABNORMAL LOW (ref 22–29)
Calcium: 10.6 mg/dL — ABNORMAL HIGH (ref 8.4–10.4)
Chloride: 106 mEq/L (ref 98–107)
Potassium: 3.3 mEq/L — ABNORMAL LOW (ref 3.5–5.1)

## 2013-04-26 LAB — POCT I-STAT, CHEM 8
Creatinine, Ser: 0.6 mg/dL (ref 0.50–1.10)
Glucose, Bld: 92 mg/dL (ref 70–99)
Hemoglobin: 14.6 g/dL (ref 12.0–15.0)
Sodium: 138 mEq/L (ref 135–145)
TCO2: 24 mmol/L (ref 0–100)

## 2013-04-26 LAB — CBC WITH DIFFERENTIAL/PLATELET
Basophils Absolute: 0 10*3/uL (ref 0.0–0.1)
EOS%: 6.4 % (ref 0.0–7.0)
HCT: 38.7 % (ref 34.8–46.6)
HGB: 12.8 g/dL (ref 11.6–15.9)
MCH: 28.2 pg (ref 25.1–34.0)
MONO#: 0.3 10*3/uL (ref 0.1–0.9)
NEUT#: 2.2 10*3/uL (ref 1.5–6.5)
RDW: 15.4 % — ABNORMAL HIGH (ref 11.2–14.5)
WBC: 5.1 10*3/uL (ref 3.9–10.3)
lymph#: 2.2 10*3/uL (ref 0.9–3.3)

## 2013-04-26 MED ORDER — ZOLEDRONIC ACID 4 MG/5ML IV CONC
4.0000 mg | Freq: Once | INTRAVENOUS | Status: AC
  Administered 2013-04-26: 4 mg via INTRAVENOUS
  Filled 2013-04-26: qty 5
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
## 2013-04-26 NOTE — Patient Instructions (Addendum)
[facility] Discharge Instructions for Patients Receiving Chemotherapy  Today you received the following chemotherapy agents ZOMETA  If you develop nausea and vomiting that is not controlled by your nausea medication, call the clinic.   BELOW ARE SYMPTOMS THAT SHOULD BE REPORTED IMMEDIATELY:  *FEVER GREATER THAN 100.5 F  *CHILLS WITH OR WITHOUT FEVER  NAUSEA AND VOMITING THAT IS NOT CONTROLLED WITH YOUR NAUSEA MEDICATION  *UNUSUAL SHORTNESS OF BREATH  *UNUSUAL BRUISING OR BLEEDING  TENDERNESS IN MOUTH AND THROAT WITH OR WITHOUT PRESENCE OF ULCERS  *URINARY PROBLEMS  *BOWEL PROBLEMS  UNUSUAL RASH Items with * indicate a potential emergency and should be followed up as soon as possible.  Feel free to call the clinic you have any questions or concerns. The clinic phone number is [phone].

## 2013-04-26 NOTE — Telephone Encounter (Signed)
appts made and printed. Pt is aware that cs will call her w/ her appts for her MRI and PET scan...td

## 2013-04-29 ENCOUNTER — Ambulatory Visit
Admission: RE | Admit: 2013-04-29 | Discharge: 2013-04-29 | Disposition: A | Discharge status: Home / Self Care | Payer: Medicaid Other | Source: Ambulatory Visit | Attending: General Surgery | Admitting: General Surgery

## 2013-04-29 ENCOUNTER — Other Ambulatory Visit (INDEPENDENT_AMBULATORY_CARE_PROVIDER_SITE_OTHER): Payer: Self-pay | Admitting: General Surgery

## 2013-04-29 DIAGNOSIS — R928 Other abnormal and inconclusive findings on diagnostic imaging of breast: Secondary | ICD-10-CM

## 2013-04-29 DIAGNOSIS — C50911 Malignant neoplasm of unspecified site of right female breast: Secondary | ICD-10-CM

## 2013-04-30 NOTE — Progress Notes (Signed)
Quick Note:  Kindly inform the patient that she must continue to follow with Dr Darnelle Catalan her oncologist along with her OB-GYN physicians within 1-2 weeks. ______

## 2013-05-02 ENCOUNTER — Encounter (INDEPENDENT_AMBULATORY_CARE_PROVIDER_SITE_OTHER): Payer: Self-pay | Admitting: General Surgery

## 2013-05-02 ENCOUNTER — Ambulatory Visit (INDEPENDENT_AMBULATORY_CARE_PROVIDER_SITE_OTHER): Payer: Medicaid Other | Admitting: General Surgery

## 2013-05-02 ENCOUNTER — Encounter (INDEPENDENT_AMBULATORY_CARE_PROVIDER_SITE_OTHER): Payer: 59 | Admitting: General Surgery

## 2013-05-02 VITALS — BP 122/82 | Resp 18 | Ht 63.0 in | Wt 157.0 lb

## 2013-05-02 DIAGNOSIS — C50919 Malignant neoplasm of unspecified site of unspecified female breast: Secondary | ICD-10-CM

## 2013-05-02 DIAGNOSIS — C7989 Secondary malignant neoplasm of other specified sites: Secondary | ICD-10-CM

## 2013-05-02 DIAGNOSIS — C779 Secondary and unspecified malignant neoplasm of lymph node, unspecified: Secondary | ICD-10-CM

## 2013-05-02 NOTE — Patient Instructions (Signed)
Plan for bilateral mastectomies and biopsy of nodule on abdomen and neck

## 2013-05-03 ENCOUNTER — Telehealth: Payer: Self-pay | Admitting: *Deleted

## 2013-05-03 ENCOUNTER — Encounter (INDEPENDENT_AMBULATORY_CARE_PROVIDER_SITE_OTHER): Payer: Self-pay | Admitting: General Surgery

## 2013-05-03 NOTE — Telephone Encounter (Signed)
05/03/13 Patient made aware to follow up with Dr.Marginat and OB GYN physicians. P.Kayle Passarelli,RN BSN [facility]

## 2013-05-03 NOTE — Progress Notes (Signed)
Subjective:     Patient ID: Tammy Blanchard, female   DOB: [birth year], 47 y.o.   MRN: [id number]  HPI The patient is a 47 year old black female who is about 3-1/2 years status post right lumpectomy and axillary lymph node dissection for treatment of a right-sided breast cancer. She underwent neoadjuvant therapy prior to this. 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 She has been reimaged and found to have cancer now in both breasts as well as the left axilla. She also has several nodules on her skin both on her abdomen and scalp and right breast that are worrisome for metastatic deposits of breast cancer.  Review of Systems  Constitutional: Positive for fatigue.  HENT: Negative.   Eyes: Negative.   Respiratory: Negative.   Cardiovascular: Negative.   Gastrointestinal: Negative.   Endocrine: Negative.   Genitourinary: Negative.   Musculoskeletal: Negative.   Skin: Negative.   Allergic/Immunologic: Negative.   Neurological: Negative.   Hematological: Negative.   Psychiatric/Behavioral: Negative.        Objective:   Physical Exam  Constitutional: She is oriented to person, place, and time. She appears well-developed and well-nourished.  HENT:  Head: Normocephalic and atraumatic.  Eyes: Conjunctivae and EOM are normal. Pupils are equal, round, and reactive to light.  Neck: Normal range of motion. Neck supple.  Cardiovascular: Normal rate, regular rhythm and normal heart sounds.   Pulmonary/Chest: Effort normal and breath sounds normal.  The patient has a large central mass in the right breast with nipple retraction and scattered skin and subcutaneous nodules. There is also some palpable fullness in the left breast with enlarged left axillary lymph nodes. She also has some firm nodules on her scalp on the right behind her ear  Abdominal: Soft. Bowel sounds are normal. There is no  tenderness.  There is a firm nodule in the skin and subcutaneous tissue measuring about 1 cm along the midabdomen  Musculoskeletal: Normal range of motion.  Lymphadenopathy:    She has no cervical adenopathy.  Neurological: She is alert and oriented to person, place, and time.  Skin: Skin is warm and dry.  Psychiatric: She has a normal mood and affect. Her behavior is normal.       Assessment:     The patient now has stage IV breast cancer with evidence of disease in both breasts. After in depth discussion with the medical oncologist we feel that bilateral mastectomy would be indicated for local control reasons. The oncologist would also like a couple of these skin lesions biopsied to confirm the diagnosis. I've discussed this with the patient in detail including the risks and benefits of surgery as well as some of the technical aspects as well as the fact that we cannot cure this disease. She understands and wishes to proceed     Plan:     Plan for bilateral simple mastectomies and biopsy of the abdominal wall nodule as well as the scalp nodule behind the right ear

## 2013-05-04 ENCOUNTER — Ambulatory Visit (HOSPITAL_COMMUNITY)
Admission: RE | Admit: 2013-05-04 | Discharge: 2013-05-04 | Disposition: A | Discharge status: Home / Self Care | Payer: Medicaid Other | Source: Ambulatory Visit | Attending: Oncology | Admitting: Oncology

## 2013-05-04 ENCOUNTER — Ambulatory Visit (HOSPITAL_COMMUNITY)
Admission: RE | Admit: 2013-05-04 | Discharge: 2013-05-04 | Disposition: A | Discharge status: Home / Self Care | Payer: Medicaid Other | Source: Ambulatory Visit

## 2013-05-04 ENCOUNTER — Encounter (HOSPITAL_COMMUNITY): Payer: Medicaid Other

## 2013-05-04 ENCOUNTER — Other Ambulatory Visit: Payer: Self-pay | Admitting: Oncology

## 2013-05-04 DIAGNOSIS — K7689 Other specified diseases of liver: Secondary | ICD-10-CM | POA: Insufficient documentation

## 2013-05-04 DIAGNOSIS — C50919 Malignant neoplasm of unspecified site of unspecified female breast: Secondary | ICD-10-CM

## 2013-05-04 DIAGNOSIS — C7931 Secondary malignant neoplasm of brain: Secondary | ICD-10-CM

## 2013-05-04 DIAGNOSIS — C7952 Secondary malignant neoplasm of bone marrow: Secondary | ICD-10-CM | POA: Insufficient documentation

## 2013-05-04 DIAGNOSIS — R51 Headache: Secondary | ICD-10-CM | POA: Insufficient documentation

## 2013-05-04 DIAGNOSIS — C7951 Secondary malignant neoplasm of bone: Secondary | ICD-10-CM | POA: Insufficient documentation

## 2013-05-04 DIAGNOSIS — Z8673 Personal history of transient ischemic attack (TIA), and cerebral infarction without residual deficits: Secondary | ICD-10-CM | POA: Insufficient documentation

## 2013-05-04 DIAGNOSIS — N1330 Unspecified hydronephrosis: Secondary | ICD-10-CM | POA: Insufficient documentation

## 2013-05-04 MED ORDER — FLUDEOXYGLUCOSE F - 18 (FDG) INJECTION
15.9000 | Freq: Once | INTRAVENOUS | Status: AC | PRN
  Administered 2013-05-04: 15.9 via INTRAVENOUS

## 2013-05-04 MED ORDER — GADOBENATE DIMEGLUMINE 529 MG/ML IV SOLN
15.0000 mL | Freq: Once | INTRAVENOUS | Status: AC | PRN
  Administered 2013-05-04: 14 mL via INTRAVENOUS

## 2013-05-05 ENCOUNTER — Other Ambulatory Visit: Payer: Self-pay | Admitting: Oncology

## 2013-05-05 LAB — GLUCOSE, CAPILLARY: Glucose-Capillary: 106 mg/dL — ABNORMAL HIGH (ref 70–99)

## 2013-05-11 ENCOUNTER — Telehealth: Payer: Self-pay | Admitting: *Deleted

## 2013-05-11 ENCOUNTER — Telehealth: Payer: Self-pay | Admitting: Oncology

## 2013-05-11 ENCOUNTER — Ambulatory Visit: Payer: Medicaid Other | Admitting: Nutrition

## 2013-05-11 ENCOUNTER — Ambulatory Visit (HOSPITAL_BASED_OUTPATIENT_CLINIC_OR_DEPARTMENT_OTHER): Payer: Medicaid Other | Admitting: Oncology

## 2013-05-11 VITALS — BP 117/81 | HR 85 | Temp 98.5°F | Resp 20 | Ht 63.0 in | Wt 160.5 lb

## 2013-05-11 DIAGNOSIS — C50919 Malignant neoplasm of unspecified site of unspecified female breast: Secondary | ICD-10-CM

## 2013-05-11 DIAGNOSIS — C50911 Malignant neoplasm of unspecified site of right female breast: Secondary | ICD-10-CM

## 2013-05-11 DIAGNOSIS — C779 Secondary and unspecified malignant neoplasm of lymph node, unspecified: Secondary | ICD-10-CM

## 2013-05-11 MED ORDER — LORAZEPAM 0.5 MG PO TABS: 0.5000 mg | ORAL_TABLET | Freq: Three times a day (TID) | ORAL | Status: DC | PRN

## 2013-05-11 NOTE — Progress Notes (Signed)
ID: Tammy Blanchard   DOB: 11/03/1966  MR#: [id number]  CSN#:[id number]  PCP: Tammy Dakins, MD SU: GYNWillodean Blanchard OTHER MD: Tammy Blanchard  HISTORY OF PRESENT ILLNESS: The [facility] showing dense breasts with microcalcifications in both breasts, so she was called back for bilateral diagnostic mammograms February 18, 2008.  These showed diffuse calcifications, particularly in the lateral aspect of the right breast.  They were felt by Dr. Judyann Blanchard to be probably benign bilaterally, but to require short interval follow-up.  So she was set up for a six-month mammogram, which she did not show up for.  She also did not return for her April mammography this year.    However, in July, she had discomfort in the right breast and palpated a mass, which she said was also visible to her.  She brought this to the attention of Dr. Delrae Blanchard at [facility], and was set up for diagnostic mammography at the [facility] on May 25, 2009.  This again showed dense breasts, but there was now an area of increased density and architectural distortion in the upper-outer right breast, corresponding to the mass palpated by the patient.  Dr. Azucena Blanchard was able to palpate the mass as well, and it measured 3.0 cm by ultrasound, being irregularly marginated and inhomogeneous.  In the left breast there was a cluster of microcalcifications, but no ultrasonographic finding.  A decision was made to biopsy both breasts, and this was done on August 2.  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  On the left side there was only atypical ductal hyperplasia.  The invasive right-sided tumor was ER+ at 98%, PR+ at 96+, with an MIB-1 of 44% and was negative for HER-2 amplification by CISH with a ratio of 0.97.   With this information, the patient was referred to Dr. Carolynne Blanchard, and bilateral breast MRIs were obtained August 9.  This showed on  the right an irregular enhancing mass measuring up to 4.6 cm (including a small anterior nodular component, which extends within 8 mm of the nipple).  There were no other areas of abnormal enhancement in either breast, and no abnormal appearing lymph nodes bilaterally.    The patient received neoadjuvant chemotherapy consisting of 6 q. three-week doses of docetaxel/ doxorubicin/ cyclophosphamide, completed in December 2010. 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 3 of 10 lymph nodes were positive. Tumor was strongly ER, PR positive and HER-2/Blanchard negative with a high proliferation fraction. Her subsequent history is as detailed below.  INTERVAL HISTORY: Tammy Blanchard returns today for followup of her metastatic breast carcinoma accompanied by her 65-year-old granddaughter.. Since her last visit here she has been evaluated by Dr. Carolynne Blanchard and the plan is to proceed to bilateral mastectomies and removal of the palpable skin lesions at the base of her neck and lower abdomen as well. She also had a PET scan which shows no evidence of involvement of the lungs, liver, or (per brain MRI) brain.   REVIEW OF SYSTEMS: Tammy Blanchard had some nausea after the zoledronic acid for about 2 days. She did not vomit. I think this is going to be associated nausea since Zometa generally does not cause that. She has aches and pains here in there, but not 1 spot worse than another. She has not had unusual  headaches, visual changes, dizziness, gait imbalance, cough, phlegm production, pleurisy, worsening shortness of breath, chest pain or pressure, or any change in bowel or bladder habits. A detailed review of systems was stable.   PAST MEDICAL HISTORY: Past Medical History  Diagnosis Date  . Hypertension   . Allergy   . GERD (gastroesophageal reflux disease)   . Thyroid disease   . Anemia     resolved  2011  . Hyperlipidemia     controlled  . Cancer 10/2009    breast  . History of blood transfusion 2009    [facility] -  UNKNOWN NUMBER OF UNITS TRANSFUSED    PAST SURGICAL HISTORY: Past Surgical History  Procedure Laterality Date  . Cesarean section    . Wisdom tooth extraction    . Tubal ligation    . Breast surgery  10/2009    right lymp nodes removed  . Left foot surgery    . Abdominal hysterectomy  11/25/2012    Procedure: HYSTERECTOMY ABDOMINAL;  Surgeon: Tammy Rosenthal, MD;  Location: [facility];  Service: Gynecology;  Laterality: N/A;  with Bilateral Salpingoopherectomy and Cystoscopy    FAMILY HISTORY Family History  Problem Relation Age of Onset  . Diabetes Mother   . Hypertension Mother   . Diabetes Maternal Aunt   . Heart disease Maternal Aunt   . Hypertension Maternal Aunt   . Stroke Maternal Aunt   . Diabetes Maternal Grandmother   . Heart disease Maternal Grandmother   . Hypertension Maternal Grandmother   . Stroke Maternal Grandmother   . Diabetes Maternal Aunt   . Hypertension Maternal Aunt     GYNECOLOGIC HISTORY: updated OCT 2013 She is GX P4, first pregnancy at age 47.  She still having periods, although irregularly. The most recent one occurred earlier this month.  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 She became disabled in may of 2014. She is widowed.  She tells me her husband was hit by [place]. Her children are Tammy Blanchard, 29, who lives in [place], but is currently unemployed. He has a 62-year-old daughter. The patient's daughter Tammy Blanchard,  has 3 children. She lives in [place]. Son Tammy Blanchard, has one child. He is Social research officer, government of [employer] here in [name]. Son Tammy Blanchard,  is a Paediatric nurse. He has one daughter. He lives in [place].  The patient is a member of [employer].     ADVANCED DIRECTIVES: Not in place  HEALTH MAINTENANCE: History  Substance Use Topics  . Smoking status:  Former Smoker -- 0.50 packs/day for 20 years    Types: Cigarettes    Quit date: 11/25/2012  . Smokeless tobacco: Never Used  . Alcohol Use: No     Colonoscopy:  PAP:  Bone density:  Lipid panel:  Allergies  Allergen Reactions  . Metronidazole Swelling    Current Outpatient Prescriptions  Medication Sig Dispense Refill  . amLODipine (NORVASC) 10 MG tablet Take 1 tablet (10 mg total) by mouth daily.  30 tablet  3  . calcium carbonate (TUMS - DOSED IN MG ELEMENTAL CALCIUM) 500 MG chewable tablet Chew 1 tablet by mouth daily.      . cloNIDine (CATAPRES) 0.2 MG tablet Take 1 tablet (0.2 mg total) by mouth at bedtime.  30 tablet  3  . docusate sodium (COLACE) 100 MG capsule Take 1 capsule (100 mg total) by mouth 2 (two) times daily as needed for constipation.  30 capsule  0  .  HYDROcodone-acetaminophen (NORCO/VICODIN) 5-325 MG per tablet       . letrozole (FEMARA) 2.5 MG tablet Take 1 tablet (2.5 mg total) by mouth daily.  30 tablet  12  . losartan (COZAAR) 100 MG tablet Take 1 tablet (100 mg total) by mouth daily.  30 tablet  3  . nystatin-triamcinolone ointment (MYCOLOG)        No current facility-administered medications for this visit.    OBJECTIVE: Middle-aged [place] American woman in no acute distress Filed Vitals:   05/11/13 0806  BP: 117/81  Pulse: 85  Temp: 98.5 F (36.9 C)  Resp: 20     Body mass index is 28.44 kg/(m^2).    ECOG FS: 2 Filed Weights   05/11/13 0806  Weight: 160 lb 8 oz (72.802 kg)   Sclerae unicteric Oropharynx clear No cervical or supraclavicular adenopathy Lungs clear to auscultation, no rales or rhonchi Heart regular rate and rhythm Abd soft, positive bowel sounds,  no tenderness;  MSK no focal spinal tenderness, no peripheral edema Neuro: nonfocal, well oriented, appropriate affect Breasts: The right breast is shrunken and hyperpigmented. There are multiple skin nodules which are dusky, easily palpable, and measuring up to 4 mm. The right  axilla is benign. The left axilla shows a 2 cm movable firm lymph node. There is a 2-3 cm mass palpable in the upper outer quadrant of the left breast, without skin involvement or nipple retraction. Skin the patient has a skin nodule in the lower abdomen which measures approximately 4 mm, one at the base of the skull on the right measuring approximately 3 mm. These are firm and dusky-looking, nonerythematous, and did not appear pustular   LAB RESULTS: Lab Results  Component Value Date   WBC 5.1 04/26/2013   NEUTROABS 2.2 04/26/2013   HGB 12.8 04/26/2013   HCT 38.7 04/26/2013   MCV 84.9 04/26/2013   PLT 290 04/26/2013      Chemistry      Component Value Date/Time   NA 137 04/26/2013 1444   NA 138 04/25/2013 1425   K 3.3 Repeated and Verified* 04/26/2013 1444   K 3.9 04/25/2013 1425   CL 106 04/26/2013 1444   CL 107 04/25/2013 1425   CO2 21* 04/26/2013 1444   CO2 23 01/03/2013 1454   BUN 8.7 04/26/2013 1444   BUN 11 04/25/2013 1425   CREATININE 0.6 04/26/2013 1444   CREATININE 0.60 04/25/2013 1425   CREATININE 0.42* 01/03/2013 1454      Component Value Date/Time   CALCIUM 10.6* 04/26/2013 1444   CALCIUM 10.5 01/03/2013 1454   ALKPHOS 250* 04/26/2013 1444   ALKPHOS 116 02/19/2012 0949   AST 27 04/26/2013 1444   AST 25 02/19/2012 0949   ALT 51 04/26/2013 1444   ALT 27 02/19/2012 0949   BILITOT 0.47 04/26/2013 1444   BILITOT 0.6 02/19/2012 0949       Lab Results  Component Value Date   LABCA2 30 08/23/2012     STUDIES: Mr Laqueta Jean ZO Contrast  2013/05/20   *RADIOLOGY REPORT*  Clinical Data: New diagnosis of breast cancer.  Staging.  MRI HEAD WITHOUT AND WITH CONTRAST  Technique:  Multiplanar, multiecho pulse sequences of the brain and surrounding structures were obtained according to standard protocol without and with intravenous contrast  Contrast: 14mL MULTIHANCE GADOBENATE DIMEGLUMINE 529 MG/ML IV SOLN  Comparison: CT head without contrast 08/22/2008.  Findings: A remote infarct is present within  the right lentiform nucleus and caudate head.  Minimal white matter  disease is present otherwise.  No acute infarct, hemorrhage, or mass lesion is present.  The postcontrast images demonstrate no evidence for metastatic disease of the brain or meninges.  There is diffuse T1 marrow suppression in the calvarium and upper cervical spine with heterogeneity suggesting diffuse bony metastases.  Flow is present in the major intracranial arteries.  The globes and orbits are intact.  The paranasal sinuses and mastoid air cells are clear.  IMPRESSION:  1.  Diffuse osseous metastases are evident in the upper cervical spine, skull base, and calvarium. 2.  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 3.  No evidence for metastatic disease to the brain or meninges. 4.  No acute intracranial abnormality.   Original Report Authenticated By: Marin Roberts, M.D.   US Breast Bilateral  04/25/2013   *RADIOLOGY REPORT*  Clinical Data:  . The patient underwent right lumpectomy, chemotherapy and radiation therapy for breast cancer in 2000 08/2010.  She had a surgical excisional biopsy of the left breast demonstrating atypical ductal hyperplasia.  The patient recently underwent hysterectomy with metastatic breast carcinoma found in the uterus, fallopian tubes, and ovaries.  She has skin nodules on the right breast and abdominal wall.  She states that she has noted increasing nipple inversion on the right.  DIGITAL DIAGNOSTIC BILATERAL MAMMOGRAM WITH CAD AND BILATERAL BREAST ULTRASOUND:  Comparison:  08/26/2012 on the right; 06/02/2012, 06/02/2011, 05/31/2010 bilaterally.  Findings:  ACR Breast Density Category c:  The breasts are heterogeneously dense, which may obscure small masses.  Right lumpectomy changes are present.  There may be increasing nipple inversion on the right.  It is difficult to delineate a definite mass within the postoperative change in the right subareolar region mammographically.  There is  a spiculated density in the left upper outer quadrant. This is more pronounced than expected for postoperative change. There is a prominent lymph node in the left axilla.  Mammographic images were processed with CAD.  On physical exam, I palpate a mass at 2 o'clock 3 cm from the left nipple.  I palpate a superficial nodule in the skin at 6 o'clock 3 cm from the right nipple and at 5 o'clock in the right inframammary fold.  Ultrasound is performed, showing intradermal nodules in the right breast.  One is at 6 o'clock 3 cm from the right nipple measuring 4 x 4 x 3 mm.  The second is at 5 o'clock in the inframammary fold measuring 3 x 2 x 5 mm.  Postoperative changes  are noted in the subareolar region.  This area will likely be better assessed on breast MRI to be performed later today.  There is an irregular mass on the left at 2 o'clock 3 cm from the left nipple measuring 1.3 x 1.1 x 1.0 cm.  There is a second mass at 2 o'clock 1 cm from the left nipple measuring 0.6 x 0.7 x 8 0.6 cm.  This is approximately 1 cm from the larger mass.  There is a lymph node with a thickened cortex in the left axilla.  The findings on the left are suspicious for invasive mammary carcinoma; ultrasound-guided core needle biopsy is suggested.  The intradermal nodules may represent metastatic breast carcinoma.  IMPRESSION:  1.  Suspicious intradermal nodules on the right. 2.  Two suspicious masses at 2 o'clock in the left breast with abnormal left axillary lymph node.  RECOMMENDATION:  1.  One may wish to consider surgical excision of one of the skin  nodules for further evaluation.  2.  Ultrasound-guided core needle biopsy of the two breast masses and the abnormal left axillary lymph node is suggested.  This could not be performed this morning due to schedule constraints and can be scheduled after the patient's MRI is interpreted.  3.  Correlate with scheduled breast MRI later today to evaluate the right subareolar region.  I have  discussed the findings and recommendations with the patient. Results were also provided in writing at the conclusion of the visit.  If applicable, a reminder letter will be sent to the patient regarding the next appointment.  BI-RADS CATEGORY 5:  Highly suggestive of malignancy - appropriate action should be taken.   Original Report Authenticated By: Cain Saupe, M.D.   Mr Breast Bilateral W Wo Contrast  04/26/2013   *RADIOLOGY REPORT*  Clinical Data: History of right lumpectomy with chemotherapy and radiation for breast cancer in 2010.  At that time, the patient had excision of left breast atypical ductal hyperplasia.  Most recently, the patient has undergone hysterectomy, showing metastatic breast carcinoma in the uterus, fallopian tubes, and ovaries.  Skin nodules have been noted on the right breast and abdominal wall.  Increasing nipple inversion on the right. Mammographic and ultrasound evaluation performed yesterday shows two suspicious nodules in the 2 o'clock location of the left breast as well as an enlarged left axillary lymph node suspicious for metastatic disease.  There is also some concern regarding the retroareolar region of the right breast on mammographic evaluation.  BILATERAL BREAST MRI WITH AND WITHOUT CONTRAST  Technique: Multiplanar, multisequence MR images of both breasts were obtained prior to and following the intravenous administration of 15ml of Multihance.  Three dimensional images were evaluated at the independent DynaCad workstation.  Comparison:  Mammogram and ultrasound [facility] 04/25/2013  Findings:  Background parenchyma: Minimal enhancement.  There is moderate fibroglandular tissue.  Left breast/axilla: Throughout the left breast, there are multiple suspicious masses. These are primarily located in the central aspect of the breast.  The largest of these measures 1.5 x 1.6 x 1.0 centimeters. Measured together, this region of enhancement is  3.4 x 2.9 x 3.4 cm.  However, extending from the central aspect of the breast towards the nipple for another 3.2 cm, there is linear, ductal type enhancement, suspicious for ductal carcinoma in situ. Within the left lower axilla, there are numerous lymph nodes showing cortical thickening.  A small level II lymph node is also identified.  Right breast/axilla: In the subareolar region, there is non mass enhancement showing moderate wash in and persistent type kinetics. The area of enhancement measures 4.2 x 4.1 x 5.6 cm.  There is right nipple retraction with the patient in prone position, correlating well with the patient's history.  No suspicious right axillary adenopathy.  Note is made of enhancing skin lesions, consistent with physical exam findings and ultrasound findings of intradermal nodules.  The findings are suspicious for metastatic disease.  Other findings: No enlarged internal mammary lymph nodes identified.  IMPRESSION:  1.  Suspicious parenchymal enhancement throughout the central right breast, associated with nipple retraction. 2.  Multiple small enhancing nodules in the central aspect of the left breast, associated with ductal type enhancement extending towards the nipple, also suspicious for malignancy. 3.  Multiple lymph nodes with thickened cortex in the left axilla, suspicious for metastatic disease. 4.  Multiple enhancing skin nodules involving the right breast, suspicious for metastatic disease.  RECOMMENDATION: Tissue diagnosis is recommended  for the subareolar region of the right breast, two lesions within the left breast, and the left axilla. The patient will be contacted regarding scheduling of these biopsies.  THREE-DIMENSIONAL MR IMAGE RENDERING ON INDEPENDENT WORKSTATION:  Three-dimensional MR images were rendered by post-processing of the original MR data on an independent workstation.  The three- dimensional MR images were interpreted, and findings were reported in the accompanying  complete MRI report for this study.  BI-RADS CATEGORY 5:  Highly suggestive of malignancy - appropriate action should be taken.   Original Report Authenticated By: Norva Pavlov, M.D.   Nm Pet Image Restag (ps) Skull Base To Thigh  05/04/2013   *RADIOLOGY REPORT*  Clinical Data: Subsequent treatment strategy for breast cancer.  NUCLEAR MEDICINE PET SKULL BASE TO THIGH  Fasting Blood Glucose:  106  Technique:  15.9 mCi F-18 FDG was injected intravenously. CT data was obtained and used for attenuation correction and anatomic localization only.  (This was not acquired as a diagnostic CT examination.) Additional exam technical data entered on technologist worksheet.  Comparison:  12/09/2012  Findings:  Neck: No hypermetabolic lymph nodes in the neck.  Chest:  No hypermetabolic mediastinal or hilar nodes.  Right axillary lymph node dissection.  Lymph node with normal fatty hilum in the left axilla is notable for mild hypermetabolism max SUV 3.2 (PET image 65) but is favored to be reactive.  Vague hypermetabolism within glandular tissue in the central breast bilaterally, max SUV 4.0 on the right and 4.1 on the left.  No suspicious pulmonary nodules on the CT scan.  Mosaic attenuation.  Abdomen/Pelvis:  No abnormal hypermetabolic activity within the liver, pancreas, adrenal glands, or spleen.  No hypermetabolic lymph nodes in the abdomen or pelvis.  Possible mild hypermetabolism in the porta hepatis (PET image 125) is without a definite CT correlate.  Prior hypermetabolism in the bilateral adnexal regions has resolved, likely postsurgical.  3.4 x 2.8 cm seroma versus lymphocele in the left pelvis (series 2/image 197), grossly unchanged.  Hepatic steatosis.  Mild left hydronephrosis, unchanged.  Skeleton:  Diffuse sclerotic osseous metastases without focal hypermetabolism.  IMPRESSION: Diffuse sclerotic osseous metastases without focal hypermetabolism.  Otherwise, no evidence of metastatic disease.  Prior  hypermetabolism in the bilateral adnexal regions has resolved, likely postsurgical.   Original Report Authenticated By: Charline Bills, M.D.   Mm Digital Diagnostic Bilat  04/25/2013   *RADIOLOGY REPORT*  Clinical Data:  . The patient underwent right lumpectomy, chemotherapy and radiation therapy for breast cancer in 2000 08/2010.  She had a surgical excisional biopsy of the left breast demonstrating atypical ductal hyperplasia.  The patient recently underwent hysterectomy with metastatic breast carcinoma found in the uterus, fallopian tubes, and ovaries.  She has skin nodules on the right breast and abdominal wall.  She states that she has noted increasing nipple inversion on the right.  DIGITAL DIAGNOSTIC BILATERAL MAMMOGRAM WITH CAD AND BILATERAL BREAST ULTRASOUND:  Comparison:  08/26/2012 on the right; 06/02/2012, 06/02/2011, 05/31/2010 bilaterally.  Findings:  ACR Breast Density Category c:  The breasts are heterogeneously dense, which may obscure small masses.  Right lumpectomy changes are present.  There may be increasing nipple inversion on the right.  It is difficult to delineate a definite mass within the postoperative change in the right subareolar region mammographically.  There is a spiculated density in the left upper outer quadrant. This is more pronounced than expected for postoperative change. There is a prominent lymph node in the left axilla.  Mammographic images were  processed with CAD.  On physical exam, I palpate a mass at 2 o'clock 3 cm from the left nipple.  I palpate a superficial nodule in the skin at 6 o'clock 3 cm from the right nipple and at 5 o'clock in the right inframammary fold.  Ultrasound is performed, showing intradermal nodules in the right breast.  One is at 6 o'clock 3 cm from the right nipple measuring 4 x 4 x 3 mm.  The second is at 5 o'clock in the inframammary fold measuring 3 x 2 x 5 mm.  Postoperative changes  are noted in the subareolar region.  This area will  likely be better assessed on breast MRI to be performed later today.  There is an irregular mass on the left at 2 o'clock 3 cm from the left nipple measuring 1.3 x 1.1 x 1.0 cm.  There is a second mass at 2 o'clock 1 cm from the left nipple measuring 0.6 x 0.7 x 8 0.6 cm.  This is approximately 1 cm from the larger mass.  There is a lymph node with a thickened cortex in the left axilla.  The findings on the left are suspicious for invasive mammary carcinoma; ultrasound-guided core needle biopsy is suggested.  The intradermal nodules may represent metastatic breast carcinoma.  IMPRESSION:  1.  Suspicious intradermal nodules on the right. 2.  Two suspicious masses at 2 o'clock in the left breast with abnormal left axillary lymph node.  RECOMMENDATION:  1.  One may wish to consider surgical excision of one of the skin nodules for further evaluation.  2.  Ultrasound-guided core needle biopsy of the two breast masses and the abnormal left axillary lymph node is suggested.  This could not be performed this morning due to schedule constraints and can be scheduled after the patient's MRI is interpreted.  3.  Correlate with scheduled breast MRI later today to evaluate the right subareolar region.  I have discussed the findings and recommendations with the patient. Results were also provided in writing at the conclusion of the visit.  If applicable, a reminder letter will be sent to the patient regarding the next appointment.  BI-RADS CATEGORY 5:  Highly suggestive of malignancy - appropriate action should be taken.   Original Report Authenticated By: Cain Saupe, M.D.   Mm Digital Diagnostic Unilat L  04/29/2013   *RADIOLOGY REPORT*  Clinical Data:  Ultrasound-guided core needle biopsy of two masses in the left breast  with clip placement.  DIGITAL DIAGNOSTIC LEFT MAMMOGRAM  Comparison:  Previous exams.  Findings:  Films are performed following ultrasound guided biopsy of two masses at 2 o'clock in the left breast.   The ribbon clip and the TriMark clip are appropriately located in the left upper outer quadrant.  IMPRESSION: Appropriate clip placement following ultrasound-guided core needle biopsy of two masses at 2 o'clock in the left breast.  The clips are approximately 2 cm apart.   Original Report Authenticated By: Cain Saupe, M.D.   [place] Lt Breast Bx W Loc Dev 1st Lesion Img Bx Spec US Guide  05/02/2013   **ADDENDUM** CREATED: 05/02/2013 09:56:12  Pathology reveals invasive ductal carcinoma at left breast two o'clock 2 cm from nipple and left breast two o'clock 4 cm from nipple, metastatic lymph node in the left axillary lymph node, invasive mammary carcinoma of the right breast. These are found to be concordant with imaging findings.  I discussed the results over the phone with the patient and answered her questions.  The  patient states she is doing well post biopsy with slight tenderness in the left breast.  The patient has appointment with Dr. Lennice Sites on May 02, 2013.  Recommend treatment plan.  **END ADDENDUM** SIGNED BY: Sherian Rein, M.D.  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  Now with metastatic disease to the pelvis and possibly bones and skin.  New left breast palpable masses and abnormal left axillary lymph node.  ULTRASOUND GUIDED VACUUM ASSISTED CORE BIOPSY OF THE LEFT BREAST X TWO AND ULTRASOUND-GUIDED CORE NEEDLE BIOPSY OF THE LEFT AXILLA  The patient and I discussed the procedure of ultrasound-guided biopsy, including benefits and alternatives.  We discussed the high likelihood of a successful procedure. We discussed the risks of the procedure including infection, bleeding, tissue injury, clip migration, and inadequate sampling.  Written informed consent was given. Appropriate time-out was performed.  Using sterile technique, 2% lidocaine, ultrasound guidance, and a 12 gauge vacuum  assisted needle, biopsy was performed of a mass at 2 o'clock 2 cm from the left nipple using a lateromedial approach. A ribbon clip was placed.  Using sterile technique, 2% lidocaine, ultrasound guidance, an 812 gauge vacuum-assisted needle, biopsy was performed of a mass at 2 o'clock 4 cm from the left nipple using a lateromedial approach.  A TriMark clip was placed.  Using sterile technique, 2% lidocaine, ultrasound guidance, and 14- gauge automated biopsy device, biopsy was performed of the left axillary lymph node using a lateromedial approach.  Follow-up two-view mammogram was performed and dictated separately.  IMPRESSION: Ultrasound-guided biopsy of two masses at 2 o'clock in the left breast and an abnormal left axillary lymph node.  No apparent complications.  Original Report Authenticated By: Cain Saupe, M.D.   [place] Lt Breast Bx W Loc Dev Ea Add Lesion Img Bx Spec US Guide  05/02/2013   **ADDENDUM** CREATED: 05/02/2013 09:56:12  Pathology reveals invasive ductal carcinoma at left breast two o'clock 2 cm from nipple and left breast two o'clock 4 cm from nipple, metastatic lymph node in the left axillary lymph node, invasive mammary carcinoma of the right breast. These are found to be concordant with imaging findings.  I discussed the results over the phone with the patient and answered her questions.  The patient states she is doing well post biopsy with slight tenderness in the left breast.  The patient has appointment with Dr. Lennice Sites on May 02, 2013.  Recommend treatment plan.  **END ADDENDUM** SIGNED BY: Sherian Rein, M.D.  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  Now with metastatic disease to the pelvis and possibly bones and skin.  New left breast palpable masses and abnormal left axillary lymph node.  ULTRASOUND GUIDED VACUUM ASSISTED CORE BIOPSY OF THE LEFT BREAST X TWO AND  ULTRASOUND-GUIDED CORE NEEDLE BIOPSY OF THE LEFT AXILLA  The patient and I discussed the procedure of ultrasound-guided biopsy, including benefits and alternatives.  We discussed the high likelihood of a successful procedure. We discussed the risks of the procedure including infection, bleeding, tissue injury, clip migration, and inadequate sampling.  Written informed consent was given. Appropriate time-out was performed.  Using sterile technique, 2% lidocaine, ultrasound guidance, and a 12 gauge vacuum assisted needle, biopsy was performed of a mass at 2 o'clock 2 cm from the left nipple using a lateromedial approach. A  ribbon clip was placed.  Using sterile technique, 2% lidocaine, ultrasound guidance, an 812 gauge vacuum-assisted needle, biopsy was performed of a mass at 2 o'clock 4 cm from the left nipple using a lateromedial approach.  A TriMark clip was placed.  Using sterile technique, 2% lidocaine, ultrasound guidance, and 14- gauge automated biopsy device, biopsy was performed of the left axillary lymph node using a lateromedial approach.  Follow-up two-view mammogram was performed and dictated separately.  IMPRESSION: Ultrasound-guided biopsy of two masses at 2 o'clock in the left breast and an abnormal left axillary lymph node.  No apparent complications.  Original Report Authenticated By: Cain Saupe, M.D.   [place] Lt Breast Bx W Loc Dev Ea Add Lesion Img Bx Spec US Guide  05/02/2013   **ADDENDUM** CREATED: 05/02/2013 09:56:12  Pathology reveals invasive ductal carcinoma at left breast two o'clock 2 cm from nipple and left breast two o'clock 4 cm from nipple, metastatic lymph node in the left axillary lymph node, invasive mammary carcinoma of the right breast. These are found to be concordant with imaging findings.  I discussed the results over the phone with the patient and answered her questions.  The patient states she is doing well post biopsy with slight tenderness in the left breast.  The patient  has appointment with Dr. Lennice Sites on May 02, 2013.  Recommend treatment plan.  **END ADDENDUM** SIGNED BY: Sherian Rein, M.D.  04/29/2013   *RADIOLOGY REPORT*  Clinical Data:  46 year old female with history of right invasive mammary carcinoma and left atypical ductal hyperplasia.  Now with metastatic disease to the pelvis and possibly bones and skin.  New left breast palpable masses and abnormal left axillary lymph node.  ULTRASOUND GUIDED VACUUM ASSISTED CORE BIOPSY OF THE LEFT BREAST X TWO AND ULTRASOUND-GUIDED CORE NEEDLE BIOPSY OF THE LEFT AXILLA  The patient and I discussed the procedure of ultrasound-guided biopsy, including benefits and alternatives.  We discussed the high likelihood of a successful procedure. We discussed the risks of the procedure including infection, bleeding, tissue injury, clip migration, and inadequate sampling.  Written informed consent was given. Appropriate time-out was performed.  Using sterile technique, 2% lidocaine, ultrasound guidance, and a 12 gauge vacuum assisted needle, biopsy was performed of a mass at 2 o'clock 2 cm from the left nipple using a lateromedial approach. A ribbon clip was placed.  Using sterile technique, 2% lidocaine, ultrasound guidance, an 812 gauge vacuum-assisted needle, biopsy was performed of a mass at 2 o'clock 4 cm from the left nipple using a lateromedial approach.  A TriMark clip was placed.  Using sterile technique, 2% lidocaine, ultrasound guidance, and 14- gauge automated biopsy device, biopsy was performed of the left axillary lymph node using a lateromedial approach.  Follow-up two-view mammogram was performed and dictated separately.  IMPRESSION: Ultrasound-guided biopsy of two masses at 2 o'clock in the left breast and an abnormal left axillary lymph node.  No apparent complications.  Original Report Authenticated By: Cain Saupe, M.D.   [place] Rt Breast Bx W Loc Dev 1st Lesion Img Bx Spec US Guide  05/02/2013   **ADDENDUM** CREATED:  05/02/2013 09:53:19  Pathology reveals invasive ductal carcinoma at left breast two o'clock 2 cm from nipple and left breast two o'clock 4 cm from nipple, metastatic lymph node in the left axillary lymph node, invasive mammary carcinoma of the right breast. These are found to be concordant with imaging findings.  I discussed the results over the phone with the patient and answered her  questions.  The patient states she is doing well post biopsy with slight tenderness in the left breast.  The patient has appointment with Dr. Lennice Sites on May 02, 2013.  Recommend treatment plan.  **END ADDENDUM** SIGNED BY: Sherian Rein, M.D.  04/29/2013   *RADIOLOGY REPORT*  Clinical Data:  47 year old female with stage IV breast cancer. The patient underwent right lumpectomy for right breast cancer and now has increasing right nipple retraction.  Abnormal enhancement in the right subareolar region on recent MRI with hypoechoic area in the same area on breast ultrasound.  Findings are concerning for recurrent breast carcinoma.  ULTRASOUND GUIDED VACUUM ASSISTED CORE BIOPSY OF THE RIGHT BREAST  The patient and I discussed the procedure of ultrasound-guided biopsy, including benefits and alternatives.  We discussed the high likelihood of a successful procedure. We discussed the risks of the procedure including infection, bleeding, tissue injury, clip migration, and inadequate sampling.  Written informed consent was given. Appropriate time-out was performed.  Using sterile technique, 2% lidocaine, ultrasound guidance, and a 12 gauge vacuum assisted needle, biopsy was performed of the hypoechoic area in the right subareolar region using a mediolateral approach.  No clip was placed.  IMPRESSION: Ultrasound-guided biopsy of a hypoechoic area in the right subareolar region concerning for possible recurrent breast carcinoma.  No apparent complications.   Original Report Authenticated By: Cain Saupe, M.D.   ASSESSMENT: A 47 y.o.  [place] woman with dense breasts  (1) status post bilateral breast biopsies in August 2010.  On the left, only atypical ductal hyperplasia.  On the right, high-grade invasive ductal carcinoma, 4.6 cm by MRI.    (2)  Treated in the neoadjuvant setting with docetaxel, doxorubicin, and cyclophosphamide x6, chemotherapy completed in December 2010.    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  Three of 10 lymph nodes were positive.  Tumor was strongly estrogen receptor/progesterone receptor positive, HER2/Blanchard negative with high proliferation fraction.   (4)  Received radiation therapy, completed May 2011,   (5)  on tamoxifen May 2011 to January 2014 when she was found to have stage IV disease  (6) s/p TAH-BSO 11/25/2012 with metastatic brast cancer, estrogen receptor 30% and progestrerone receptor 20% positive, HER-2 negative  (7) anastrozole started February 2014, discontinued in April 2014 due to poor tolerance  (8)  patient started letrozole in mid May 2014  (9) skin involvement over the left breast and possibly other distant skin sites noted June 2014, with   (a) biopsy of the left breast and a left axillary node 04/29/2013  confirming 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  PLAN: Darene's staging studies show no involvement of the lung, liver, or brain. Accordingly we are going to be aggressive with local control and the patient is scheduled for bilateral mastectomies and left  axillary sentinel lymph node sampling July 23. Her skin nodules will be removed at the same time.  For the systemic treatment we're going to continue the letrozole and zoledronic acid. Since we will have only bony disease to  follow I am obtaining a baseline bone scan. The patient also needs genetic testing. This is being scheduled.  All this was discussed with Aram Beecham today. It is not clear to me whether she will need radiation therapy for local control after her surgery, and we will assess that once we have the final pathology. I am writing her a lorazepam prescription for her to take before her Zometa treatments to see if week and prevent the associated of nausea she experienced the first time. Otherwise she knows to call for any problems that may develop before the next visit here.    MAGRINAT,GUSTAV C    05/11/2013

## 2013-05-11 NOTE — Progress Notes (Signed)
Pt is a 46 y/o female Dx with Breast Ca.  She is scheduled for a double mastectomy 05/25/13. Tx is pending following surgery.   Past medical Hx includes HTN, GERD, Anemia, and HLD.  Meds:  Amlodipine, Calcium, Clonidine, Norco, Femara, Cozaar, and Mycolog.    Labs noted  Ht 61" Wt 159.8#  UBW 160# BMI 30.23  Pt reports good appetite and po intake, c/o occasional early satiety. No complaints of nausea, problems swallowing or bowel issues.  Pt has been trying to eat healthier, by grilling meats.  Dietary recall reveals pt is eating 2 meals/day with some snacks.  Diet appears low in plant based foods.    Nutrition Dx:  Food and nutrition related knowledge deficit related to diagnosis of Breast Ca and healthy eating as evidenced by no prior need for nutrition related information.   Intervention:  I educated pt on low-fat plant-based diet.  Pt encouraged to eating at least three balanced meals/day or six small meals, if appetite and early satiety are a problem.  Provided tips on decreasing the fat in current intake, while adding more whole grains, fruits and vegetables.  Recommend pt maintain wt and continue diet following surgery, with emphasis on lean proteins.  Fact sheets, recipes and guidelines provided.  Contact information for Vernell Leep, RD given.   Monitoring, evaluation, goals:  Patient will makes healthy changes in diet to promote good nutrition, balanced diet and wt maintenance in preparation for surgery and possible further tx.    Next visit: Pt does not have tx scheduled at this time, therefore no follow up was made.  Pt has RD contact information if question or concerns arise.

## 2013-05-11 NOTE — Telephone Encounter (Signed)
Per staff message and POF I have scheduled appts.  JMW  

## 2013-05-12 NOTE — Progress Notes (Signed)
Can you help me add a left axillary dissection to the orders for Tammy Blanchard?

## 2013-05-16 ENCOUNTER — Encounter (HOSPITAL_COMMUNITY): Payer: Self-pay | Admitting: Respiratory Therapy

## 2013-05-18 ENCOUNTER — Encounter (HOSPITAL_COMMUNITY): Payer: Self-pay

## 2013-05-18 ENCOUNTER — Encounter (HOSPITAL_COMMUNITY)
Admission: RE | Admit: 2013-05-18 | Discharge: 2013-05-18 | Disposition: A | Discharge status: Home / Self Care | Payer: Medicaid Other | Source: Ambulatory Visit | Attending: Oncology | Admitting: Oncology

## 2013-05-18 ENCOUNTER — Telehealth: Payer: Self-pay | Admitting: *Deleted

## 2013-05-18 ENCOUNTER — Encounter: Payer: Self-pay | Admitting: Family Medicine

## 2013-05-18 ENCOUNTER — Ambulatory Visit: Payer: No Typology Code available for payment source | Attending: Family Medicine | Admitting: Family Medicine

## 2013-05-18 VITALS — BP 125/83 | HR 85 | Temp 98.8°F | Resp 16 | Ht 63.0 in | Wt 159.4 lb

## 2013-05-18 DIAGNOSIS — D509 Iron deficiency anemia, unspecified: Secondary | ICD-10-CM

## 2013-05-18 DIAGNOSIS — J309 Allergic rhinitis, unspecified: Secondary | ICD-10-CM

## 2013-05-18 DIAGNOSIS — C779 Secondary and unspecified malignant neoplasm of lymph node, unspecified: Secondary | ICD-10-CM | POA: Insufficient documentation

## 2013-05-18 DIAGNOSIS — C50919 Malignant neoplasm of unspecified site of unspecified female breast: Secondary | ICD-10-CM

## 2013-05-18 DIAGNOSIS — I10 Essential (primary) hypertension: Secondary | ICD-10-CM | POA: Insufficient documentation

## 2013-05-18 DIAGNOSIS — C50911 Malignant neoplasm of unspecified site of right female breast: Secondary | ICD-10-CM

## 2013-05-18 DIAGNOSIS — H539 Unspecified visual disturbance: Secondary | ICD-10-CM | POA: Insufficient documentation

## 2013-05-18 MED ORDER — TECHNETIUM TC 99M MEDRONATE IV KIT
24.4000 | PACK | Freq: Once | INTRAVENOUS | Status: AC | PRN
  Administered 2013-05-18: 24.4 via INTRAVENOUS

## 2013-05-18 NOTE — Progress Notes (Signed)
Patient ID: Tammy Blanchard, female   DOB: 1966/06/05, 47 y.o.   MRN: [id number]  CC:  Follow up   HPI: Pt is scheduled to have a mastectomy next week.  She has metastatic breast cancer from 4 years ago that has recently been found in both breasts, uterus, bone.  Pt says that she doesn't know if she is going to need chemo and radiation therapy at this time.  Pt is requesting a referral to an eye doctor because she has had some visual changes recently.   Allergies  Allergen Reactions  . Metronidazole Swelling   Past Medical History  Diagnosis Date  . Hypertension   . Allergy   . GERD (gastroesophageal reflux disease)   . Thyroid disease   . Anemia     resolved 2011  . Hyperlipidemia     controlled  . Cancer 10/2009    breast  . History of blood transfusion 2009    [facility] -  UNKNOWN NUMBER OF UNITS TRANSFUSED   Current Outpatient Prescriptions on File Prior to Visit  Medication Sig Dispense Refill  . amLODipine (NORVASC) 10 MG tablet Take 1 tablet (10 mg total) by mouth daily.  30 tablet  3  . cloNIDine (CATAPRES) 0.2 MG tablet Take 1 tablet (0.2 mg total) by mouth at bedtime.  30 tablet  3  . docusate sodium (COLACE) 100 MG capsule Take 1 capsule (100 mg total) by mouth 2 (two) times daily as needed for constipation.  30 capsule  0  . HYDROcodone-acetaminophen (NORCO/VICODIN) 5-325 MG per tablet Take 1 tablet by mouth every 6 (six) hours as needed.       Marland Kitchen letrozole (FEMARA) 2.5 MG tablet Take 1 tablet (2.5 mg total) by mouth daily.  30 tablet  12  . LORazepam (ATIVAN) 0.5 MG tablet Take 1 tablet (0.5 mg total) by mouth every 8 (eight) hours as needed for anxiety (take before every zometa treatment).  30 tablet  0  . losartan (COZAAR) 100 MG tablet Take 1 tablet (100 mg total) by mouth daily.  30 tablet  3  . calcium carbonate (TUMS - DOSED IN MG ELEMENTAL CALCIUM) 500 MG chewable tablet Chew 1 tablet by mouth daily.       No current facility-administered medications on file prior to  visit.   Family History  Problem Relation Age of Onset  . Diabetes Mother   . Hypertension Mother   . Diabetes Maternal Aunt   . Heart disease Maternal Aunt   . Hypertension Maternal Aunt   . Stroke Maternal Aunt   . Diabetes Maternal Grandmother   . Heart disease Maternal Grandmother   . Hypertension Maternal Grandmother   . Stroke Maternal Grandmother   . Diabetes Maternal Aunt   . Hypertension Maternal Aunt    History   Social History  . Marital Status: Widowed    Spouse Name: N/A    Number of Children: N/A  . Years of Education: N/A   Occupational History  . Not on file.   Social History Main Topics  . Smoking status: Current Some Day Smoker -- 0.50 packs/day for 20 years    Types: Cigarettes    Last Attempt to Quit: 11/25/2012  . Smokeless tobacco: Never Used  . Alcohol Use: No  . Drug Use: No  . Sexually Active: Yes    Birth Control/ Protection: Surgical   Other Topics Concern  . Not on file   Social History Narrative  . No narrative on file  Review of Systems  Constitutional: Negative for fever, chills, diaphoresis, activity change, appetite change and fatigue.  HENT: Negative for ear pain, nosebleeds, congestion, facial swelling, rhinorrhea, neck pain, neck stiffness and ear discharge.   Eyes: Negative for pain, discharge, redness, itching and visual disturbance.  Respiratory: Negative for cough, choking, chest tightness, shortness of breath, wheezing and stridor.   Cardiovascular: Negative for chest pain, palpitations and leg swelling.  Gastrointestinal: Negative for abdominal distention.  Genitourinary: Negative for dysuria, urgency, frequency, hematuria, flank pain, decreased urine volume, difficulty urinating and dyspareunia.  Musculoskeletal: Negative for back pain, joint swelling, arthralgias and gait problem.  Neurological: Negative for dizziness, tremors, seizures, syncope, facial asymmetry, speech difficulty, weakness, light-headedness,  numbness and headaches.  Hematological: Negative for adenopathy. Does not bruise/bleed easily.  Psychiatric/Behavioral: Negative for hallucinations, behavioral problems, confusion, dysphoric mood, decreased concentration and agitation.    Objective:   Filed Vitals:   05/18/13 0916  BP: 125/83  Pulse: 85  Temp: 98.8 F (37.1 C)  Resp: 16    Physical Exam  Constitutional: Appears well-developed and well-nourished. No distress.  HENT: Normocephalic. External right and left ear normal. Oropharynx is clear and moist.  Eyes: Conjunctivae and EOM are normal. PERRLA, no scleral icterus.  Neck: Normal ROM. Neck supple. No JVD. No tracheal deviation. No thyromegaly.  CVS: RRR, S1/S2 +, no murmurs, no gallops, no carotid bruit.  Pulmonary: Effort and breath sounds normal, no stridor, rhonchi, wheezes, rales.  Abdominal: Soft. BS +,  no distension, tenderness, rebound or guarding.  Musculoskeletal: Normal range of motion. No edema and no tenderness.  Lymphadenopathy: No lymphadenopathy noted, cervical, inguinal. Neuro: Alert. Normal reflexes, muscle tone coordination. No cranial nerve deficit. Skin: Skin is warm and dry. No rash noted. Not diaphoretic. No erythema. No pallor.  Psychiatric: Normal mood and affect. Behavior, judgment, thought content normal.   Lab Results  Component Value Date   WBC 5.1 04/26/2013   HGB 12.8 04/26/2013   HCT 38.7 04/26/2013   MCV 84.9 04/26/2013   PLT 290 04/26/2013   Lab Results  Component Value Date   CREATININE 0.6 04/26/2013   BUN 8.7 04/26/2013   NA 137 04/26/2013   K 3.3 Repeated and Verified* 04/26/2013   CL 106 04/26/2013   CO2 21* 04/26/2013    No results found for this basename: HGBA1C   Lipid Panel     Component Value Date/Time   CHOL 204* 08/22/2010 0843   TRIG 155* 08/22/2010 0843   HDL 31* 08/22/2010 0843   CHOLHDL 6.6 08/22/2010 0843   VLDL 31 08/22/2010 0843   LDLCALC 142* 08/22/2010 0843       Assessment and plan:   Patient  Active Problem List   Diagnosis Date Noted  . 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 ADVERSE DRUG REACTION 12/05/2010  . MICROSCOPIC HEMATURIA 12/03/2010  . VAGINAL DISCHARGE 12/03/2010  . THYROID MASS 08/02/2009  . HYPERTHYROIDISM 08/02/2009  . ADENOCARCINOMA, RIGHT BREAST 07/06/2009  . JOINT EFFUSION, LEFT KNEE 04/23/2009  . GERD 12/18/2008  . TOBACCO ABUSE 07/26/2008  . REACTIVE AIRWAY DISEASE 07/26/2008  . CALLUSES, FEET, BILATERAL 04/10/2008  . CONSTIPATION 12/02/2007  . SUBSCAPULARIS SPRAIN AND STRAIN 12/02/2007  . ALLERGIC RHINITIS 06/17/2007  . ANEMIA-IRON DEFICIENCY 06/27/2005  . HYPERLIPIDEMIA 02/19/2005  . HYPERTENSION 04/15/2004   Pt to have mastectomy bilateral next week.     Referral to optometrist for eye exam.  RTC in 4 months   The patient was given clear instructions to go to ER  or return to medical center if symptoms don't improve, worsen or new problems develop.  The patient verbalized understanding.  The patient was told to call to get any lab results if not heard anything in the next week.    Rodney Langton, MD, CDE, FAAFP [facility] [place], [place]

## 2013-05-18 NOTE — Telephone Encounter (Signed)
Faxed request from [facility] requesting order for Amlodipine and Clonidine.  Patient has requested refills and these have never been filled at this pharmacy location.  Noted these meds were prescribed by a provider not at this office.  Arlys John at [facility] pharmacy notified of this.  Called patient to inform her to obtain refills from the original precsriber.

## 2013-05-18 NOTE — Patient Instructions (Signed)
Blurred Vision °You have been seen today complaining of blurred vision. This means you have a loss of ability to see small details.  °CAUSES  °Blurred vision can be a symptom of underlying eye problems, such as: °· Aging of the eye (presbyopia). °· Glaucoma. °· Cataracts. °· Eye infection. °· Eye-related migraine. °· Diabetes mellitus. °· Fatigue. °· Migraine headaches. °· High blood pressure. °· Breakdown of the back of the eye (macular degeneration). °· Problems caused by some medications. °The most common cause of blurred vision is the need for eyeglasses or a new prescription. Today in the emergency department, no cause for your blurred vision can be found. °SYMPTOMS  °Blurred vision is the loss of visual sharpness and detail (acuity). °DIAGNOSIS  °Should blurred vision continue, you should see your caregiver. If your caregiver is your primary care physician, he or she may choose to refer you to another specialist.  °TREATMENT  °Do not ignore your blurred vision. Make sure to have it checked out to see if further treatment or referral is necessary. °SEEK MEDICAL CARE IF:  °You are unable to get into a specialist so we can help you with a referral. °SEEK IMMEDIATE MEDICAL CARE IF: °You have severe eye pain, severe headache, or sudden loss of vision. °MAKE SURE YOU:  °· Understand these instructions. °· Will watch your condition. °· Will get help right away if you are not doing well or get worse. °Document Released: 10/23/2003 Document Revised: 01/12/2012 Document Reviewed: 05/24/2008 °[employer]® Patient Information ©2014 [employer]. ° °

## 2013-05-18 NOTE — Progress Notes (Signed)
Pt here for f/u visit s/p HTN, and medication. Hx. Breast Ca being seen @ [facility].taking prescribed medications. Denies pain. Need eye. doctor referral.vss

## 2013-05-19 ENCOUNTER — Encounter (HOSPITAL_COMMUNITY)
Admission: RE | Admit: 2013-05-19 | Discharge: 2013-05-19 | Disposition: A | Discharge status: Home / Self Care | Payer: No Typology Code available for payment source | Source: Ambulatory Visit | Attending: General Surgery | Admitting: General Surgery

## 2013-05-19 ENCOUNTER — Encounter (HOSPITAL_COMMUNITY): Payer: Self-pay

## 2013-05-19 DIAGNOSIS — Z01811 Encounter for preprocedural respiratory examination: Secondary | ICD-10-CM | POA: Insufficient documentation

## 2013-05-19 DIAGNOSIS — Z01818 Encounter for other preprocedural examination: Secondary | ICD-10-CM | POA: Insufficient documentation

## 2013-05-19 DIAGNOSIS — Z01812 Encounter for preprocedural laboratory examination: Secondary | ICD-10-CM | POA: Insufficient documentation

## 2013-05-19 LAB — BASIC METABOLIC PANEL
Chloride: 104 mEq/L (ref 96–112)
GFR calc Af Amer: >90 mL/min (ref >90)
GFR calc non Af Amer: >90 mL/min (ref >90)
Glucose, Bld: 101 mg/dL — ABNORMAL HIGH (ref 70–99)
Potassium: 3.9 mEq/L (ref 3.5–5.1)
Sodium: 138 mEq/L (ref 135–145)

## 2013-05-19 LAB — CBC
Hemoglobin: 13 g/dL (ref 12.0–15.0)
MCH: 28 pg (ref 26.0–34.0)
RBC: 4.64 MIL/uL (ref 3.87–5.11)

## 2013-05-19 NOTE — Pre-Procedure Instructions (Signed)
Tammy Blanchard  05/19/2013   Your procedure is scheduled on: Wednesday, May 25, 2013  Report to [facility] at 9:00 AM.  Call this number if you have problems the morning of surgery: [phone]   Remember:   Do not eat food or drink liquids after midnight.   Take these medicines the morning of surgery with A SIP OF WATER: amLODipine (NORVASC) 10 MG tablet, letrozole (FEMARA) 2.5 MG tablet,  if needed:HYDROcodone-acetaminophen 5-325 MG per tablet for pain, and Lorazepam (Ativan) if needed for anxiety  Stop taking Aspirin and herbal medications 5 days prior to surgery.    Do not wear jewelry, make-up or nail polish.  Do not wear lotions, powders, or perfumes. You may NOT wear deodorant.  Do not shave 48 hours prior to surgery.  Do not bring valuables to the hospital.  [facility] is not responsible for any belongings or valuables.  Contacts, dentures or bridgework may not be worn into surgery.  Leave suitcase in the car. After surgery it may be brought to your room.  For patients admitted to the hospital, checkout time is 11:00 AM the day of discharge.   Patients discharged the day of surgery will not be allowed to drive home.  Name and phone number of your driver:   Special Instructions: Shower using CHG 2 nights before surgery and the night before surgery.  If you shower the day of surgery use CHG.  Use special wash - you have one bottle of CHG for all showers.  You should use approximately 1/3 of the bottle for each shower.   Please read over the following fact sheets that you were given: Pain Booklet, Coughing and Deep Breathing and Surgical Site Infection Prevention

## 2013-05-19 NOTE — Progress Notes (Signed)
Patient denied having a stress test, cardiac cath, or sleep study. Encounters with PCP in [facility] and LOV was 05/18/13.

## 2013-05-20 ENCOUNTER — Other Ambulatory Visit: Payer: Self-pay | Admitting: Family Medicine

## 2013-05-20 ENCOUNTER — Telehealth: Payer: Self-pay | Admitting: Medical Oncology

## 2013-05-20 DIAGNOSIS — C50919 Malignant neoplasm of unspecified site of unspecified female breast: Secondary | ICD-10-CM

## 2013-05-20 MED ORDER — AMLODIPINE BESYLATE 10 MG PO TABS: 10.0000 mg | ORAL_TABLET | Freq: Every day | ORAL | Status: DC

## 2013-05-20 MED ORDER — CLONIDINE HCL 0.2 MG PO TABS: 0.2000 mg | ORAL_TABLET | Freq: Every day | ORAL | Status: DC

## 2013-05-20 NOTE — Telephone Encounter (Signed)
2nd request from [facility] pharmacy to Tammy Blanchard to refill Amlodipine and Clonidine. She did not prescribe these meds. . Last prescribed by ? Hospitalist, Tammy Blanchard.  [name], MD [facility] saw pt this week.. Pt contacted earlier in week to contact original prescriber for refills.

## 2013-05-24 ENCOUNTER — Other Ambulatory Visit (HOSPITAL_BASED_OUTPATIENT_CLINIC_OR_DEPARTMENT_OTHER): Payer: Medicaid Other | Admitting: Lab

## 2013-05-24 ENCOUNTER — Encounter: Payer: Self-pay | Admitting: Physician Assistant

## 2013-05-24 ENCOUNTER — Ambulatory Visit (HOSPITAL_BASED_OUTPATIENT_CLINIC_OR_DEPARTMENT_OTHER): Payer: Medicaid Other

## 2013-05-24 ENCOUNTER — Telehealth: Payer: Self-pay | Admitting: *Deleted

## 2013-05-24 ENCOUNTER — Ambulatory Visit (HOSPITAL_BASED_OUTPATIENT_CLINIC_OR_DEPARTMENT_OTHER): Payer: Medicaid Other | Admitting: Physician Assistant

## 2013-05-24 VITALS — BP 119/82 | HR 89 | Temp 98.2°F | Resp 20 | Ht 63.0 in | Wt 161.5 lb

## 2013-05-24 DIAGNOSIS — C773 Secondary and unspecified malignant neoplasm of axilla and upper limb lymph nodes: Secondary | ICD-10-CM

## 2013-05-24 DIAGNOSIS — C7989 Secondary malignant neoplasm of other specified sites: Secondary | ICD-10-CM

## 2013-05-24 DIAGNOSIS — C50419 Malignant neoplasm of upper-outer quadrant of unspecified female breast: Secondary | ICD-10-CM

## 2013-05-24 DIAGNOSIS — C50919 Malignant neoplasm of unspecified site of unspecified female breast: Secondary | ICD-10-CM

## 2013-05-24 DIAGNOSIS — R232 Flushing: Secondary | ICD-10-CM

## 2013-05-24 DIAGNOSIS — C7952 Secondary malignant neoplasm of bone marrow: Secondary | ICD-10-CM

## 2013-05-24 DIAGNOSIS — C50911 Malignant neoplasm of unspecified site of right female breast: Secondary | ICD-10-CM

## 2013-05-24 DIAGNOSIS — C7951 Secondary malignant neoplasm of bone: Secondary | ICD-10-CM

## 2013-05-24 LAB — CBC WITH DIFFERENTIAL/PLATELET
BASO%: 0.9 % (ref 0.0–2.0)
Basophils Absolute: 0 10e3/uL (ref 0.0–0.1)
EOS%: 5.3 % (ref 0.0–7.0)
Eosinophils Absolute: 0.3 10e3/uL (ref 0.0–0.5)
HCT: 37.1 % (ref 34.8–46.6)
HGB: 12.1 g/dL (ref 11.6–15.9)
LYMPH%: 46.8 % (ref 14.0–49.7)
MCH: 28.1 pg (ref 25.1–34.0)
MCHC: 32.7 g/dL (ref 31.5–36.0)
MCV: 85.9 fL (ref 79.5–101.0)
MONO#: 0.4 10e3/uL (ref 0.1–0.9)
MONO%: 8.4 % (ref 0.0–14.0)
NEUT#: 2 10e3/uL (ref 1.5–6.5)
NEUT%: 38.6 % (ref 38.4–76.8)
Platelets: 257 10e3/uL (ref 145–400)
RBC: 4.32 10e6/uL (ref 3.70–5.45)
RDW: 15.2 % — ABNORMAL HIGH (ref 11.2–14.5)
WBC: 5.2 10e3/uL (ref 3.9–10.3)
lymph#: 2.4 10e3/uL (ref 0.9–3.3)

## 2013-05-24 LAB — COMPREHENSIVE METABOLIC PANEL (CC13)
CO2: 26 mEq/L (ref 22–29)
Creatinine: 0.7 mg/dL (ref 0.6–1.1)
Glucose: 97 mg/dl (ref 70–140)
Total Bilirubin: 0.61 mg/dL (ref 0.20–1.20)
Total Protein: 8.1 g/dL (ref 6.4–8.3)

## 2013-05-24 MED ORDER — LORAZEPAM 1 MG PO TABS
0.5000 mg | ORAL_TABLET | Freq: Once | ORAL | Status: AC
  Administered 2013-05-24: 0.5 mg via ORAL

## 2013-05-24 MED ORDER — ZOLEDRONIC ACID 4 MG/100ML IV SOLN
4.0000 mg | Freq: Once | INTRAVENOUS | Status: AC
  Administered 2013-05-24: 4 mg via INTRAVENOUS
  Filled 2013-05-24: qty 100

## 2013-05-24 MED ORDER — CEFAZOLIN SODIUM-DEXTROSE 2-3 GM-% IV SOLR
2.0000 g | INTRAVENOUS | Status: AC
  Administered 2013-05-25: 2 g via INTRAVENOUS
  Filled 2013-05-24: qty 50

## 2013-05-24 MED ORDER — SODIUM CHLORIDE 0.9 % IV SOLN
Freq: Once | INTRAVENOUS | Status: AC
  Administered 2013-05-24: 16:00:00 via INTRAVENOUS

## 2013-05-24 NOTE — Progress Notes (Signed)
ID: Tammy Blanchard   DOB: [birth year]  MR#: [id number]  JYN#:[id number]  PCP: [name], MD SU: Chevis Pretty, MD GYN: [name]  HISTORY OF PRESENT ILLNESS: The patient had screening mammography at the [facility] February 14, 2008 showing dense breasts with microcalcifications in both breasts, so she was called back for bilateral diagnostic mammograms February 18, 2008.  These showed diffuse calcifications, particularly in the lateral aspect of the right breast.  They were felt by Dr. Judyann Munson to be probably benign bilaterally, but to require short interval follow-up.  So she was set up for a six-month mammogram, which she did not show up for.  She also did not return for her April mammography this year.    However, in July, she had discomfort in the right breast and palpated a mass, which she said was also visible to her.  She brought this to the attention of Dr. Delrae Alfred at [facility], and was set up for diagnostic mammography at the [facility] on May 25, 2009.  This again showed dense breasts, but there was now an area of increased density and architectural distortion in the upper-outer right breast, corresponding to the mass palpated by the patient.  Dr. Azucena Kuba was able to palpate the mass as well, and it measured 3.0 cm by ultrasound, being irregularly marginated and inhomogeneous.  In the left breast there was a cluster of microcalcifications, but no ultrasonographic finding.  A decision was made to biopsy both breasts, and this was done on August 2.  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  On the left side there was only atypical ductal hyperplasia.  The invasive right-sided tumor was ER+ at 98%, PR+ at 96+, with an MIB-1 of 44% and was negative for HER-2 amplification by CISH with a ratio of 0.97.   With this information, the patient was referred to Dr. Carolynne Edouard, and bilateral breast MRIs were obtained August 9.   This showed on the right an irregular enhancing mass measuring up to 4.6 cm (including a small anterior nodular component, which extends within 8 mm of the nipple).  There were no other areas of abnormal enhancement in either breast, and no abnormal appearing lymph nodes bilaterally.    The patient received neoadjuvant chemotherapy consisting of 6 q. three-week doses of docetaxel/ doxorubicin/ cyclophosphamide, completed in December 2010. 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 3 of 10 lymph nodes were positive. Tumor was strongly ER, PR positive and HER-2/neu negative with a high proliferation fraction. Her subsequent history is as detailed below.  INTERVAL HISTORY: Tammy Blanchard returns alone today for followup of her metastatic breast carcinoma.  She continues on letrozole daily with good tolerance. Her biggest complaint is continued hot flashes, and he seemed to have increased somewhat in frequency. She's also receiving zoledronic acid monthly basis with good tolerance. She's had no jaw pain, no recent dental procedures and no extractions.  Interval history is remarkable for Tammy Blanchard having been scheduled for bilateral mastectomies, and these will be performed tomorrow, July 23, by Dr. Carolynne Edouard. She is a little anxious about the surgery, and is "more emotional" than she expected. Since her last visit here we also obtained a bone scan and chest x-ray. Both of these showed healing metastatic bony lesions, and no additional evidence of metastatic disease.   REVIEW  OF SYSTEMS: Tammy Blanchard has had no fevers or chills. She denies any skin changes and has had no abnormal bleeding. Her energy level is fair. Her appetite is good. She did have some mild nausea after the last dose of Zometa which will be treated in the future with lorazepam. She is having regular bowel movements. She denies any  increased cough, shortness of breath, or chest pain. She does have some pain in the right breast itself. She also has some occasional pain in the right arm, but no swelling. She denies any abnormal headaches or dizziness.  A detailed review of systems is otherwise stable and noncontributory.   PAST MEDICAL HISTORY: Past Medical History  Diagnosis Date  . Hypertension   . Allergy   . GERD (gastroesophageal reflux disease)   . Thyroid disease   . Anemia     resolved 2011  . Hyperlipidemia     controlled  . Cancer 10/2009    breast  . History of blood transfusion 2009    [facility] -  UNKNOWN NUMBER OF UNITS TRANSFUSED    PAST SURGICAL HISTORY: Past Surgical History  Procedure Laterality Date  . Cesarean section    . Wisdom tooth extraction    . Tubal ligation    . Breast surgery  10/2009    right lymp nodes removed  . Left foot surgery    . Abdominal hysterectomy  11/25/2012    Procedure: HYSTERECTOMY ABDOMINAL;  Surgeon: [name], MD;  Location: [facility];  Service: Gynecology;  Laterality: N/A;  with Bilateral Salpingoopherectomy and Cystoscopy    FAMILY HISTORY Family History  Problem Relation Age of Onset  . Diabetes Mother   . Hypertension Mother   . Diabetes Maternal Aunt   . Heart disease Maternal Aunt   . Hypertension Maternal Aunt   . Stroke Maternal Aunt   . Diabetes Maternal Grandmother   . Heart disease Maternal Grandmother   . Hypertension Maternal Grandmother   . Stroke Maternal Grandmother   . Diabetes Maternal Aunt   . Hypertension Maternal Aunt     GYNECOLOGIC HISTORY: updated OCT 2013 She is GX P4, first pregnancy at age 56.  She still having periods, although irregularly. The most recent one occurred earlier this month.  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 She became disabled in may of 2014. She is widowed.  She tells me her husband was hit by [place]. Her children are [name],  29, who lives in [place], but is currently unemployed. He has a 56-year-old daughter. The patient's daughter [name],  has 3 children. She lives in [place]. Son [name], has one child. He is Social research officer, government of [employer] here in [place]. Son [name],  is a Paediatric nurse. He has one daughter. He lives in [place].  The patient is a member of [employer].     ADVANCED DIRECTIVES: Not in place  HEALTH MAINTENANCE: History  Substance Use Topics  . Smoking status: Current Some Day Smoker -- 0.50 packs/day for 20 years    Types: Cigarettes    Last Attempt to Quit: 11/25/2012  . Smokeless tobacco: Never Used  . Alcohol Use: Yes     Comment: social     Colonoscopy:  PAP:  Bone density:  Lipid panel:  Allergies  Allergen Reactions  . Metronidazole Swelling    Current Outpatient Prescriptions  Medication Sig Dispense Refill  . amLODipine (NORVASC) 10 MG tablet Take 1 tablet (10 mg total)  by mouth daily.  30 tablet  3  . calcium carbonate (TUMS - DOSED IN MG ELEMENTAL CALCIUM) 500 MG chewable tablet Chew 1 tablet by mouth daily.      . cloNIDine (CATAPRES) 0.2 MG tablet Take 1 tablet (0.2 mg total) by mouth at bedtime.  30 tablet  3  . docusate sodium (COLACE) 100 MG capsule Take 1 capsule (100 mg total) by mouth 2 (two) times daily as needed for constipation.  30 capsule  0  . HYDROcodone-acetaminophen (NORCO/VICODIN) 5-325 MG per tablet Take 1 tablet by mouth every 6 (six) hours as needed.       Marland Kitchen letrozole (FEMARA) 2.5 MG tablet Take 1 tablet (2.5 mg total) by mouth daily.  30 tablet  12  . LORazepam (ATIVAN) 0.5 MG tablet Take 1 tablet (0.5 mg total) by mouth every 8 (eight) hours as needed for anxiety (take before every zometa treatment).  30 tablet  0  . losartan (COZAAR) 100 MG tablet Take 1 tablet (100 mg total) by mouth daily.  30 tablet  3   No current facility-administered medications for this visit.   Facility-Administered Medications Ordered in Other Visits   Medication Dose Route Frequency Provider Last Rate Last Dose  . [START ON 05/25/2013] ceFAZolin (ANCEF) IVPB 2 g/50 mL premix  2 g Intravenous On Call to OR Caleen Essex III, MD        OBJECTIVE: Middle-aged African American woman in no acute distress Filed Vitals:   05/24/13 1406  BP: 119/82  Pulse: 89  Temp: 98.2 F (36.8 C)  Resp: 20     Body mass index is 28.62 kg/(m^2).    ECOG FS: 1 Filed Weights   05/24/13 1406  Weight: 161 lb 8 oz (73.256 kg)   Sclerae unicteric Oropharynx clear No cervical or supraclavicular adenopathy Lungs clear to auscultation, no wheezes, no rales or rhonchi Heart regular rate and rhythm Abdomen soft, positive bowel sounds,  no tenderness;  MSK no focal spinal tenderness, no peripheral edema Neuro: nonfocal, well oriented, appropriate affect Breasts: Deferred.    LAB RESULTS: Lab Results  Component Value Date   WBC 5.2 05/24/2013   NEUTROABS 2.0 05/24/2013   HGB 12.1 05/24/2013   HCT 37.1 05/24/2013   MCV 85.9 05/24/2013   PLT 257 05/24/2013      Chemistry      Component Value Date/Time   NA 138 05/19/2013 0931   NA 137 04/26/2013 1444   K 3.9 05/19/2013 0931   K 3.3 Repeated and Verified* 04/26/2013 1444   CL 104 05/19/2013 0931   CL 106 04/26/2013 1444   CO2 23 05/19/2013 0931   CO2 21* 04/26/2013 1444   BUN 8 05/19/2013 0931   BUN 8.7 04/26/2013 1444   CREATININE 0.42* 05/19/2013 0931   CREATININE 0.6 04/26/2013 1444   CREATININE 0.42* 01/03/2013 1454      Component Value Date/Time   CALCIUM 10.1 05/19/2013 0931   CALCIUM 10.6* 04/26/2013 1444   ALKPHOS 250* 04/26/2013 1444   ALKPHOS 116 02/19/2012 0949   AST 27 04/26/2013 1444   AST 25 02/19/2012 0949   ALT 51 04/26/2013 1444   ALT 27 02/19/2012 0949   BILITOT 0.47 04/26/2013 1444   BILITOT 0.6 02/19/2012 0949       STUDIES:  Dg Chest 2 View  05/19/2013   *RADIOLOGY REPORT*  Clinical Data: History of breast cancer.  CHEST - 2 VIEW  Comparison: PET CT scan 05/04/2013 and right body bone  scan  7th 16 14.  PA and lateral chest 11/23/2009.  Findings: The lungs are clear.  Heart size is normal.  No pneumothorax or pleural fluid is identified.  Surgical clips right axilla are noted.  Diffuse osteosclerosis consistent with metastatic disease is identified as on the prior PET CT scan.  IMPRESSION:  1.  No acute disease. 2.  Diffuse osteosclerosis consistent with metastatic disease.   Original Report Authenticated By: Holley Dexter, M.D.   Mr Laqueta Jean Wo Contrast  05/04/2013   *RADIOLOGY REPORT*  Clinical Data: New diagnosis of breast cancer.  Staging.  MRI HEAD WITHOUT AND WITH CONTRAST  Technique:  Multiplanar, multiecho pulse sequences of the brain and surrounding structures were obtained according to standard protocol without and with intravenous contrast  Contrast: 14mL MULTIHANCE GADOBENATE DIMEGLUMINE 529 MG/ML IV SOLN  Comparison: CT head without contrast 08/22/2008.  Findings: A remote infarct is present within the right lentiform nucleus and caudate head.  Minimal white matter disease is present otherwise.  No acute infarct, hemorrhage, or mass lesion is present.  The postcontrast images demonstrate no evidence for metastatic disease of the brain or meninges.  There is diffuse T1 marrow suppression in the calvarium and upper cervical spine with heterogeneity suggesting diffuse bony metastases.  Flow is present in the major intracranial arteries.  The globes and orbits are intact.  The paranasal sinuses and mastoid air cells are clear.  IMPRESSION:  1.  Diffuse osseous metastases are evident in the upper cervical spine, skull base, and calvarium. 2.  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 3.  No evidence for metastatic disease to the brain or meninges. 4.  No acute intracranial abnormality.   Original Report Authenticated By: Marin Roberts, M.D.   Nm Bone Scan Whole Body  05/18/2013   *RADIOLOGY REPORT*  Clinical Data: Metastatic breast cancer.  NUCLEAR  MEDICINE WHOLE BODY BONE SCINTIGRAPHY  Technique:  Whole body anterior and posterior images were obtained approximately 3 hours after intravenous injection of radiopharmaceutical.  Radiopharmaceutical: 24.4MILLI CURIE TC-MDP TECHNETIUM TC 21M MEDRONATE IV KIT  Comparison: Whole body bone scan 06/27/2009.  PET CT 05/04/2013.  Findings: The uptake within the axial and appendicular skeleton remains homogeneous and within normal limits.  No areas of focally increased or decreased activity are demonstrated to correspond with the diffuse sclerotic lesions on CT.  These are new from a chest CT performed 06/27/2009 and likely indicate treated osseous metastases.  The soft tissue activity is stable with mild collecting system fullness in the left kidney.  IMPRESSION: No scintigraphic evidence of osseous metastatic disease.  CTs demonstrate diffuse osteosclerosis most consistent with treated metastatic disease.   Original Report Authenticated By: Carey Bullocks, M.D.    Nm Pet Image Restag (ps) Skull Base To Thigh  05/04/2013   *RADIOLOGY REPORT*  Clinical Data: Subsequent treatment strategy for breast cancer.  NUCLEAR MEDICINE PET SKULL BASE TO THIGH  Fasting Blood Glucose:  106  Technique:  15.9 mCi F-18 FDG was injected intravenously. CT data was obtained and used for attenuation correction and anatomic localization only.  (This was not acquired as a diagnostic CT examination.) Additional exam technical data entered on technologist worksheet.  Comparison:  12/09/2012  Findings:  Neck: No hypermetabolic lymph nodes in the neck.  Chest:  No hypermetabolic mediastinal or hilar nodes.  Right axillary lymph node dissection.  Lymph node with normal fatty hilum in the left axilla is notable for mild hypermetabolism max SUV 3.2 (PET image 65) but is  favored to be reactive.  Vague hypermetabolism within glandular tissue in the central breast bilaterally, max SUV 4.0 on the right and 4.1 on the left.  No suspicious pulmonary  nodules on the CT scan.  Mosaic attenuation.  Abdomen/Pelvis:  No abnormal hypermetabolic activity within the liver, pancreas, adrenal glands, or spleen.  No hypermetabolic lymph nodes in the abdomen or pelvis.  Possible mild hypermetabolism in the porta hepatis (PET image 125) is without a definite CT correlate.  Prior hypermetabolism in the bilateral adnexal regions has resolved, likely postsurgical.  3.4 x 2.8 cm seroma versus lymphocele in the left pelvis (series 2/image 197), grossly unchanged.  Hepatic steatosis.  Mild left hydronephrosis, unchanged.  Skeleton:  Diffuse sclerotic osseous metastases without focal hypermetabolism.  IMPRESSION: Diffuse sclerotic osseous metastases without focal hypermetabolism.  Otherwise, no evidence of metastatic disease.  Prior hypermetabolism in the bilateral adnexal regions has resolved, likely postsurgical.   Original Report Authenticated By: Charline Bills, M.D.   Mm Digital Diagnostic Bilat  04/25/2013   *RADIOLOGY REPORT*  Clinical Data:  . The patient underwent right lumpectomy, chemotherapy and radiation therapy for breast cancer in 2000 08/2010.  She had a surgical excisional biopsy of the left breast demonstrating atypical ductal hyperplasia.  The patient recently underwent hysterectomy with metastatic breast carcinoma found in the uterus, fallopian tubes, and ovaries.  She has skin nodules on the right breast and abdominal wall.  She states that she has noted increasing nipple inversion on the right.  DIGITAL DIAGNOSTIC BILATERAL MAMMOGRAM WITH CAD AND BILATERAL BREAST ULTRASOUND:  Comparison:  08/26/2012 on the right; 06/02/2012, 06/02/2011, 05/31/2010 bilaterally.  Findings:  ACR Breast Density Category c:  The breasts are heterogeneously dense, which may obscure small masses.  Right lumpectomy changes are present.  There may be increasing nipple inversion on the right.  It is difficult to delineate a definite mass within the postoperative change in the right  subareolar region mammographically.  There is a spiculated density in the left upper outer quadrant. This is more pronounced than expected for postoperative change. There is a prominent lymph node in the left axilla.  Mammographic images were processed with CAD.  On physical exam, I palpate a mass at 2 o'clock 3 cm from the left nipple.  I palpate a superficial nodule in the skin at 6 o'clock 3 cm from the right nipple and at 5 o'clock in the right inframammary fold.  Ultrasound is performed, showing intradermal nodules in the right breast.  One is at 6 o'clock 3 cm from the right nipple measuring 4 x 4 x 3 mm.  The second is at 5 o'clock in the inframammary fold measuring 3 x 2 x 5 mm.  Postoperative changes  are noted in the subareolar region.  This area will likely be better assessed on breast MRI to be performed later today.  There is an irregular mass on the left at 2 o'clock 3 cm from the left nipple measuring 1.3 x 1.1 x 1.0 cm.  There is a second mass at 2 o'clock 1 cm from the left nipple measuring 0.6 x 0.7 x 8 0.6 cm.  This is approximately 1 cm from the larger mass.  There is a lymph node with a thickened cortex in the left axilla.  The findings on the left are suspicious for invasive mammary carcinoma; ultrasound-guided core needle biopsy is suggested.  The intradermal nodules may represent metastatic breast carcinoma.  IMPRESSION:  1.  Suspicious intradermal nodules on the right. 2.  Two suspicious masses at 2 o'clock in the left breast with abnormal left axillary lymph node.  RECOMMENDATION:  1.  One may wish to consider surgical excision of one of the skin nodules for further evaluation.  2.  Ultrasound-guided core needle biopsy of the two breast masses and the abnormal left axillary lymph node is suggested.  This could not be performed this morning due to schedule constraints and can be scheduled after the patient's MRI is interpreted.  3.  Correlate with scheduled breast MRI later today to evaluate  the right subareolar region.  I have discussed the findings and recommendations with the patient. Results were also provided in writing at the conclusion of the visit.  If applicable, a reminder letter will be sent to the patient regarding the next appointment.  BI-RADS CATEGORY 5:  Highly suggestive of malignancy - appropriate action should be taken.   Original Report Authenticated By: Cain Saupe, M.D.    Mm Digital Diagnostic Unilat L  04/29/2013   *RADIOLOGY REPORT*  Clinical Data:  Ultrasound-guided core needle biopsy of two masses in the left breast  with clip placement.  DIGITAL DIAGNOSTIC LEFT MAMMOGRAM  Comparison:  Previous exams.  Findings:  Films are performed following ultrasound guided biopsy of two masses at 2 o'clock in the left breast.  The ribbon clip and the TriMark clip are appropriately located in the left upper outer quadrant.  IMPRESSION: Appropriate clip placement following ultrasound-guided core needle biopsy of two masses at 2 o'clock in the left breast.  The clips are approximately 2 cm apart.   Original Report Authenticated By: Cain Saupe, M.D.     ASSESSMENT: A 47 y.o. [place] woman with dense breasts  (1) status post bilateral breast biopsies in August 2010.  On the left, only atypical ductal hyperplasia.  On the right, high-grade invasive ductal carcinoma, 4.6 cm by MRI.    (2)  Treated in the neoadjuvant setting with docetaxel, doxorubicin, and cyclophosphamide x6, chemotherapy completed in December 2010.    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  Three of 10 lymph nodes were positive.  Tumor was strongly estrogen receptor/progesterone receptor positive, HER2/neu negative with high proliferation fraction.   (4)  Received radiation therapy, completed May 2011,   (5)  on tamoxifen May 2011 to January 2014 when she  was found to have stage IV disease  (6) s/p TAH-BSO 11/25/2012 with metastatic brast cancer, estrogen receptor 30% and progestrerone receptor 20% positive, HER-2 negative  (7) anastrozole started February 2014, discontinued in April 2014 due to poor tolerance  (8)  patient started letrozole in mid May 2014  (9)  zoledronic acid given on a monthly basis for bony metastatic disease, first dose in May 2014  (10) skin involvement over the left breast and possibly other distant skin sites noted June 2014, with   (a) biopsy of the left breast and a left axillary node 04/29/2013  confirming 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  PLAN: We reviewed the results of Tammy Blanchard's recent bone scan and  chest x-ray together today. These studies showed great control of her bony metastatic disease thus far. We are going to continue with her current regimen, which will include letrozole and monthly zoledronic acid. She will receive her next dose of zoledronic acid today as scheduled.   She'll proceed to bilateral mastectomies tomorrow, July 23, under the care of Dr. Carolynne Edouard. She has an appointment for a postoperative visit with him on August 4, and will see Dr. Darnelle Catalan for followup on August 8. At that time they will review her pathology report and discuss any necessary changes to be made in her regimen. They will also discuss whether or not she will need radiation therapy for local control after surgery.   Otherwise, she is already scheduled for her next monthly dose of zoledronic acid on August 19. She is also scheduled to meet with our genetics  counselor on August 21. All this was reviewed in detail with Tammy Blanchard today. She voices understanding and agreement with our plan, and will call for any changes or problems.  Of note, Tammy Blanchard will be given a low dose of lorazepam just prior to her dose of zoledronic acid, and also has lorazepam on hand to take at home for the next couple days for associative nausea.   Tammy Blanchard    05/24/2013

## 2013-05-24 NOTE — Patient Instructions (Addendum)
Zoledronic Acid injection (Hypercalcemia, Oncology) What is this medicine? ZOLEDRONIC ACID (ZOE le dron ik AS id) lowers the amount of calcium loss from bone. It is used to treat too much calcium in your blood from cancer. It is also used to prevent complications of cancer that has spread to the bone. This medicine may be used for other purposes; ask your health care provider or pharmacist if you have questions. What should I tell my health care provider before I take this medicine? They need to know if you have any of these conditions: -aspirin-sensitive asthma -dental disease -kidney disease -an unusual or allergic reaction to zoledronic acid, other medicines, foods, dyes, or preservatives -pregnant or trying to get pregnant -breast-feeding How should I use this medicine? This medicine is for infusion into a vein. It is given by a health care professional in a hospital or clinic setting. Talk to your pediatrician regarding the use of this medicine in children. Special care may be needed. Overdosage: If you think you have taken too much of this medicine contact a poison control center or emergency room at once. NOTE: This medicine is only for you. Do not share this medicine with others. What if I miss a dose? It is important not to miss your dose. Call your doctor or health care professional if you are unable to keep an appointment. What may interact with this medicine? -certain antibiotics given by injection -NSAIDs, medicines for pain and inflammation, like ibuprofen or naproxen -some diuretics like bumetanide, furosemide -teriparatide -thalidomide This list may not describe all possible interactions. Give your health care provider a list of all the medicines, herbs, non-prescription drugs, or dietary supplements you use. Also tell them if you smoke, drink alcohol, or use illegal drugs. Some items may interact with your medicine. What should I watch for while using this medicine? Visit  your doctor or health care professional for regular checkups. It may be some time before you see the benefit from this medicine. Do not stop taking your medicine unless your doctor tells you to. Your doctor may order blood tests or other tests to see how you are doing. Women should inform their doctor if they wish to become pregnant or think they might be pregnant. There is a potential for serious side effects to an unborn child. Talk to your health care professional or pharmacist for more information. You should make sure that you get enough calcium and vitamin D while you are taking this medicine. Discuss the foods you eat and the vitamins you take with your health care professional. Some people who take this medicine have severe bone, joint, and/or muscle pain. This medicine may also increase your risk for a broken thigh bone. Tell your doctor right away if you have pain in your upper leg or groin. Tell your doctor if you have any pain that does not go away or that gets worse. What side effects may I notice from receiving this medicine? Side effects that you should report to your doctor or health care professional as soon as possible: -allergic reactions like skin rash, itching or hives, swelling of the face, lips, or tongue -anxiety, confusion, or depression -breathing problems -changes in vision -feeling faint or lightheaded, falls -jaw burning, cramping, pain -muscle cramps, stiffness, or weakness -trouble passing urine or change in the amount of urine Side effects that usually do not require medical attention (report to your doctor or health care professional if they continue or are bothersome): -bone, joint, or muscle pain -  fever -hair loss -irritation at site where injected -loss of appetite -nausea, vomiting -stomach upset -tired This list may not describe all possible side effects. Call your doctor for medical advice about side effects. You may report side effects to FDA at  1-800-FDA-1088. Where should I keep my medicine? This drug is given in a hospital or clinic and will not be stored at home. NOTE: This sheet is a summary. It may not cover all possible information. If you have questions about this medicine, talk to your doctor, pharmacist, or health care provider.  2012, [employer]/Gold Standard. (04/18/2011 9:06:58 AM) 

## 2013-05-24 NOTE — Telephone Encounter (Signed)
appts made and printed...td 

## 2013-05-25 ENCOUNTER — Observation Stay (HOSPITAL_COMMUNITY)
Admission: RE | Admit: 2013-05-25 | Discharge: 2013-05-26 | Disposition: A | Discharge status: Home / Self Care | Payer: Medicaid Other | Source: Ambulatory Visit | Attending: General Surgery | Admitting: General Surgery

## 2013-05-25 ENCOUNTER — Encounter (HOSPITAL_COMMUNITY): Payer: Self-pay | Admitting: Certified Registered Nurse Anesthetist

## 2013-05-25 ENCOUNTER — Ambulatory Visit (HOSPITAL_COMMUNITY): Payer: Medicaid Other | Admitting: Certified Registered Nurse Anesthetist

## 2013-05-25 ENCOUNTER — Encounter (HOSPITAL_COMMUNITY)
Admission: RE | Disposition: A | Discharge status: Home / Self Care | Payer: Self-pay | Source: Ambulatory Visit | Attending: General Surgery

## 2013-05-25 ENCOUNTER — Encounter (HOSPITAL_COMMUNITY): Payer: Self-pay | Admitting: Surgery

## 2013-05-25 DIAGNOSIS — C792 Secondary malignant neoplasm of skin: Secondary | ICD-10-CM

## 2013-05-25 DIAGNOSIS — C50919 Malignant neoplasm of unspecified site of unspecified female breast: Principal | ICD-10-CM | POA: Insufficient documentation

## 2013-05-25 DIAGNOSIS — D214 Benign neoplasm of connective and other soft tissue of abdomen: Secondary | ICD-10-CM

## 2013-05-25 DIAGNOSIS — D235 Other benign neoplasm of skin of trunk: Secondary | ICD-10-CM | POA: Insufficient documentation

## 2013-05-25 HISTORY — PX: MASTECTOMY COMPLETE / SIMPLE: SUR845

## 2013-05-25 HISTORY — PX: TOTAL MASTECTOMY: SHX6129

## 2013-05-25 HISTORY — PX: MASS BIOPSY: SHX5445

## 2013-05-25 SURGERY — MASTECTOMY, SIMPLE
Anesthesia: General | Site: Breast | Wound class: Clean

## 2013-05-25 MED ORDER — HEPARIN SODIUM (PORCINE) 5000 UNIT/ML IJ SOLN
5000.0000 [IU] | Freq: Three times a day (TID) | INTRAMUSCULAR | Status: DC
  Administered 2013-05-26: 5000 [IU] via SUBCUTANEOUS
  Filled 2013-05-25 (×3): qty 1

## 2013-05-25 MED ORDER — KCL IN DEXTROSE-NACL 20-5-0.9 MEQ/L-%-% IV SOLN
INTRAVENOUS | Status: DC
  Administered 2013-05-25: 100 mL/h via INTRAVENOUS
  Administered 2013-05-26: 11:00:00 via INTRAVENOUS
  Filled 2013-05-25 (×7): qty 1000

## 2013-05-25 MED ORDER — ONDANSETRON HCL 4 MG/2ML IJ SOLN
INTRAMUSCULAR | Status: DC | PRN
  Administered 2013-05-25: 4 mg via INTRAVENOUS

## 2013-05-25 MED ORDER — ONDANSETRON HCL 4 MG/2ML IJ SOLN
INTRAMUSCULAR | Status: AC
  Filled 2013-05-25: qty 2

## 2013-05-25 MED ORDER — DIPHENHYDRAMINE HCL 12.5 MG/5ML PO ELIX: 12.5000 mg | ORAL_SOLUTION | Freq: Four times a day (QID) | ORAL | Status: DC | PRN

## 2013-05-25 MED ORDER — PROPOFOL 10 MG/ML IV BOLUS
INTRAVENOUS | Status: DC | PRN
  Administered 2013-05-25: 160 mg via INTRAVENOUS
  Administered 2013-05-25: 50 mg via INTRAVENOUS
  Administered 2013-05-25: 40 mg via INTRAVENOUS
  Administered 2013-05-25 (×2): 50 mg via INTRAVENOUS

## 2013-05-25 MED ORDER — ONDANSETRON HCL 4 MG/2ML IJ SOLN
4.0000 mg | Freq: Once | INTRAMUSCULAR | Status: AC | PRN
  Administered 2013-05-25: 4 mg via INTRAVENOUS

## 2013-05-25 MED ORDER — BACITRACIN ZINC 500 UNIT/GM EX OINT
TOPICAL_OINTMENT | CUTANEOUS | Status: DC | PRN
  Administered 2013-05-25: 1 via TOPICAL

## 2013-05-25 MED ORDER — LOSARTAN POTASSIUM 50 MG PO TABS: 100.0000 mg | ORAL_TABLET | Freq: Every day | ORAL | Status: DC

## 2013-05-25 MED ORDER — LETROZOLE 2.5 MG PO TABS
2.5000 mg | ORAL_TABLET | Freq: Every day | ORAL | Status: DC
Start: 2013-05-26 — End: 2013-05-26
  Administered 2013-05-26: 2.5 mg via ORAL
  Filled 2013-05-25: qty 1

## 2013-05-25 MED ORDER — KETOROLAC TROMETHAMINE 30 MG/ML IJ SOLN: 15.0000 mg | Freq: Once | INTRAMUSCULAR | Status: DC | PRN

## 2013-05-25 MED ORDER — CHLORHEXIDINE GLUCONATE 4 % EX LIQD
1.0000 | Freq: Once | CUTANEOUS | Status: DC
Start: 2013-05-25 — End: 2013-05-25

## 2013-05-25 MED ORDER — CHLORHEXIDINE GLUCONATE 4 % EX LIQD: 1.0000 "application " | Freq: Once | CUTANEOUS | Status: DC

## 2013-05-25 MED ORDER — DOCUSATE SODIUM 100 MG PO CAPS: 100.0000 mg | ORAL_CAPSULE | Freq: Two times a day (BID) | ORAL | Status: DC | PRN

## 2013-05-25 MED ORDER — DOUBLE ANTIBIOTIC 500-10000 UNIT/GM EX OINT
TOPICAL_OINTMENT | CUTANEOUS | Status: AC
  Filled 2013-05-25: qty 1

## 2013-05-25 MED ORDER — LACTATED RINGERS IV SOLN
INTRAVENOUS | Status: DC
  Administered 2013-05-25: 09:00:00 via INTRAVENOUS

## 2013-05-25 MED ORDER — HYDROMORPHONE HCL PF 1 MG/ML IJ SOLN
INTRAMUSCULAR | Status: AC
  Filled 2013-05-25: qty 1

## 2013-05-25 MED ORDER — FENTANYL CITRATE 0.05 MG/ML IJ SOLN
INTRAMUSCULAR | Status: DC | PRN
  Administered 2013-05-25 (×2): 50 ug via INTRAVENOUS
  Administered 2013-05-25 (×3): 100 ug via INTRAVENOUS
  Administered 2013-05-25 (×2): 50 ug via INTRAVENOUS
  Administered 2013-05-25: 100 ug via INTRAVENOUS
  Administered 2013-05-25: 50 ug via INTRAVENOUS

## 2013-05-25 MED ORDER — LORAZEPAM 0.5 MG PO TABS: 0.5000 mg | ORAL_TABLET | Freq: Three times a day (TID) | ORAL | Status: DC | PRN

## 2013-05-25 MED ORDER — MIDAZOLAM HCL 5 MG/5ML IJ SOLN
INTRAMUSCULAR | Status: DC | PRN
  Administered 2013-05-25: 2 mg via INTRAVENOUS

## 2013-05-25 MED ORDER — ONDANSETRON HCL 4 MG/2ML IJ SOLN
4.0000 mg | Freq: Four times a day (QID) | INTRAMUSCULAR | Status: DC | PRN
  Administered 2013-05-25 – 2013-05-26 (×2): 4 mg via INTRAVENOUS
  Filled 2013-05-25 (×2): qty 2

## 2013-05-25 MED ORDER — LOSARTAN POTASSIUM 50 MG PO TABS
100.0000 mg | ORAL_TABLET | Freq: Every day | ORAL | Status: DC
  Administered 2013-05-26: 100 mg via ORAL
  Filled 2013-05-25: qty 2

## 2013-05-25 MED ORDER — MORPHINE SULFATE (PF) 1 MG/ML IV SOLN
INTRAVENOUS | Status: AC
  Filled 2013-05-25: qty 25

## 2013-05-25 MED ORDER — SODIUM CHLORIDE 0.9 % IJ SOLN: 9.0000 mL | INTRAMUSCULAR | Status: DC | PRN

## 2013-05-25 MED ORDER — LACTATED RINGERS IV SOLN
INTRAVENOUS | Status: DC | PRN
  Administered 2013-05-25 (×2): via INTRAVENOUS

## 2013-05-25 MED ORDER — KCL IN DEXTROSE-NACL 20-5-0.45 MEQ/L-%-% IV SOLN
INTRAVENOUS | Status: AC
  Filled 2013-05-25: qty 1000

## 2013-05-25 MED ORDER — AMLODIPINE BESYLATE 10 MG PO TABS
10.0000 mg | ORAL_TABLET | Freq: Every day | ORAL | Status: DC
  Administered 2013-05-26: 10 mg via ORAL
  Filled 2013-05-25: qty 1

## 2013-05-25 MED ORDER — CLONIDINE HCL 0.2 MG PO TABS
0.2000 mg | ORAL_TABLET | Freq: Every day | ORAL | Status: DC
  Administered 2013-05-25: 0.2 mg via ORAL
  Filled 2013-05-25 (×2): qty 1

## 2013-05-25 MED ORDER — 0.9 % SODIUM CHLORIDE (POUR BTL) OPTIME
TOPICAL | Status: DC | PRN
  Administered 2013-05-25: 1000 mL

## 2013-05-25 MED ORDER — LIDOCAINE HCL (CARDIAC) 20 MG/ML IV SOLN
INTRAVENOUS | Status: DC | PRN
  Administered 2013-05-25: 100 mg via INTRAVENOUS

## 2013-05-25 MED ORDER — MORPHINE SULFATE (PF) 1 MG/ML IV SOLN
INTRAVENOUS | Status: DC
  Administered 2013-05-25: 1.5 mg via INTRAVENOUS
  Administered 2013-05-25: 6 mg via INTRAVENOUS
  Administered 2013-05-26: 5 mg via INTRAVENOUS
  Administered 2013-05-26: 05:00:00 via INTRAVENOUS
  Filled 2013-05-25: qty 25

## 2013-05-25 MED ORDER — HYDROMORPHONE HCL PF 1 MG/ML IJ SOLN
0.2500 mg | INTRAMUSCULAR | Status: DC | PRN
  Administered 2013-05-25 (×2): 0.5 mg via INTRAVENOUS

## 2013-05-25 MED ORDER — DIPHENHYDRAMINE HCL 50 MG/ML IJ SOLN: 12.5000 mg | Freq: Four times a day (QID) | INTRAMUSCULAR | Status: DC | PRN

## 2013-05-25 MED ORDER — NALOXONE HCL 0.4 MG/ML IJ SOLN
0.4000 mg | INTRAMUSCULAR | Status: DC | PRN
  Filled 2013-05-25: qty 1

## 2013-05-25 MED ORDER — CALCIUM CARBONATE ANTACID 500 MG PO CHEW
1.0000 | CHEWABLE_TABLET | Freq: Every day | ORAL | Status: DC
  Administered 2013-05-26: 200 mg via ORAL
  Filled 2013-05-25 (×2): qty 1

## 2013-05-25 MED ORDER — HYDROCODONE-ACETAMINOPHEN 5-325 MG PO TABS
1.0000 | ORAL_TABLET | Freq: Four times a day (QID) | ORAL | Status: DC | PRN
  Administered 2013-05-26: 1 via ORAL
  Filled 2013-05-25: qty 1

## 2013-05-25 SURGICAL SUPPLY — 46 items
APPLIER CLIP 9.375 MED OPEN (MISCELLANEOUS) ×3
BINDER BREAST LRG (GAUZE/BANDAGES/DRESSINGS) IMPLANT
BINDER BREAST XLRG (GAUZE/BANDAGES/DRESSINGS) ×3 IMPLANT
CANISTER SUCTION 2500CC (MISCELLANEOUS) ×3 IMPLANT
CHLORAPREP W/TINT 26ML (MISCELLANEOUS) ×6 IMPLANT
CLIP APPLIE 9.375 MED OPEN (MISCELLANEOUS) ×2 IMPLANT
CLOTH BEACON ORANGE TIMEOUT ST (SAFETY) ×3 IMPLANT
CONT SPEC STER OR (MISCELLANEOUS) ×9 IMPLANT
COVER SURGICAL LIGHT HANDLE (MISCELLANEOUS) ×3 IMPLANT
DERMABOND ADVANCED (GAUZE/BANDAGES/DRESSINGS) ×1
DERMABOND ADVANCED .7 DNX12 (GAUZE/BANDAGES/DRESSINGS) ×2 IMPLANT
DRAIN CHANNEL 19F RND (DRAIN) ×6 IMPLANT
DRAPE LAPAROSCOPIC ABDOMINAL (DRAPES) ×3 IMPLANT
DRAPE ORTHO SPLIT 77X108 STRL (DRAPES) ×2
DRAPE SURG ORHT 6 SPLT 77X108 (DRAPES) ×4 IMPLANT
DRAPE UTILITY 15X26 W/TAPE STR (DRAPE) ×6 IMPLANT
ELECT COATED BLADE 2.86 ST (ELECTRODE) ×3 IMPLANT
ELECT REM PT RETURN 9FT ADLT (ELECTROSURGICAL) ×3
ELECTRODE REM PT RTRN 9FT ADLT (ELECTROSURGICAL) ×2 IMPLANT
EVACUATOR SILICONE 100CC (DRAIN) ×6 IMPLANT
GAUZE XEROFORM 5X9 LF (GAUZE/BANDAGES/DRESSINGS) ×3 IMPLANT
GLOVE BIO SURGEON STRL SZ7.5 (GLOVE) ×3 IMPLANT
GLOVE BIOGEL PI IND STRL 7.5 (GLOVE) ×4 IMPLANT
GLOVE BIOGEL PI IND STRL 8 (GLOVE) ×2 IMPLANT
GLOVE BIOGEL PI INDICATOR 7.5 (GLOVE) ×2
GLOVE BIOGEL PI INDICATOR 8 (GLOVE) ×1
GLOVE SKINSENSE NS SZ8.0 LF (GLOVE) ×1
GLOVE SKINSENSE STRL SZ8.0 LF (GLOVE) ×2 IMPLANT
GOWN BRE IMP SLV SIRUS LXLNG (GOWN DISPOSABLE) ×6 IMPLANT
GOWN STRL NON-REIN LRG LVL3 (GOWN DISPOSABLE) ×9 IMPLANT
KIT BASIN OR (CUSTOM PROCEDURE TRAY) ×3 IMPLANT
KIT ROOM TURNOVER OR (KITS) ×3 IMPLANT
NS IRRIG 1000ML POUR BTL (IV SOLUTION) ×3 IMPLANT
PACK GENERAL/GYN (CUSTOM PROCEDURE TRAY) ×3 IMPLANT
PAD ARMBOARD 7.5X6 YLW CONV (MISCELLANEOUS) ×3 IMPLANT
SPECIMEN JAR X LARGE (MISCELLANEOUS) ×3 IMPLANT
SPONGE GAUZE 4X4 12PLY (GAUZE/BANDAGES/DRESSINGS) ×3 IMPLANT
SUT ETHILON 3 0 FSL (SUTURE) ×6 IMPLANT
SUT ETHILON 4 0 PS 2 18 (SUTURE) ×3 IMPLANT
SUT MON AB 4-0 PC3 18 (SUTURE) ×6 IMPLANT
SUT VIC AB 3-0 54X BRD REEL (SUTURE) ×2 IMPLANT
SUT VIC AB 3-0 BRD 54 (SUTURE) ×1
SUT VIC AB 3-0 SH 18 (SUTURE) ×6 IMPLANT
TAPE CLOTH SURG 4X10 WHT LF (GAUZE/BANDAGES/DRESSINGS) ×3 IMPLANT
TOWEL OR 17X24 6PK STRL BLUE (TOWEL DISPOSABLE) ×3 IMPLANT
TOWEL OR 17X26 10 PK STRL BLUE (TOWEL DISPOSABLE) ×3 IMPLANT

## 2013-05-25 NOTE — Preoperative (Signed)
Beta Blockers   Reason not to administer Beta Blockers:Not Applicable 

## 2013-05-25 NOTE — Transfer of Care (Signed)
Immediate Anesthesia Transfer of Care Note  Patient: Tammy Blanchard  Procedure(s) Performed: Procedure(s): Bilateral Total Mastectomy (Bilateral) Biopsy  nodule on abdomen and right chest wall, and Right neck (N/A)  Patient Location: PACU  Anesthesia Type:General  Level of Consciousness: awake, alert , oriented and sedated  Airway & Oxygen Therapy: Patient Spontanous Breathing and Patient connected to nasal cannula oxygen  Post-op Assessment: Report given to PACU RN, Post -op Vital signs reviewed and stable and Patient moving all extremities  Post vital signs: Reviewed and stable  Complications: No apparent anesthesia complications

## 2013-05-25 NOTE — Anesthesia Postprocedure Evaluation (Signed)
Anesthesia Post Note  Patient: Tammy Blanchard  Procedure(s) Performed: Procedure(s) (LRB): Bilateral Total Mastectomy (Bilateral) Biopsy  nodule on abdomen and right chest wall, and Right neck (N/A)  Anesthesia type: General  Patient location: PACU  Post pain: Pain level controlled and Adequate analgesia  Post assessment: Post-op Vital signs reviewed, Patient's Cardiovascular Status Stable, Respiratory Function Stable, Patent Airway and Pain level controlled  Last Vitals:  Filed Vitals:   05/25/13 1420  BP: 130/98  Pulse: 94  Temp:   Resp: 21    Post vital signs: Reviewed and stable  Level of consciousness: awake, alert  and oriented  Complications: No apparent anesthesia complications

## 2013-05-25 NOTE — Anesthesia Preprocedure Evaluation (Signed)
Anesthesia Evaluation  Patient identified by MRN, date of birth, ID band Patient awake    Reviewed: Allergy & Precautions, H&P , Patient's Chart, lab work & pertinent test results  Airway Mallampati: II TM Distance: >3 FB     Dental  (+) Teeth Intact and Dental Advisory Given   Pulmonary  breath sounds clear to auscultation        Cardiovascular Rhythm:Regular Rate:Normal     Neuro/Psych    GI/Hepatic   Endo/Other    Renal/GU      Musculoskeletal   Abdominal   Peds  Hematology   Anesthesia Other Findings   Reproductive/Obstetrics                           Anesthesia Physical Anesthesia Plan  ASA: III  Anesthesia Plan: General   Post-op Pain Management:    Induction: Intravenous  Airway Management Planned: LMA  Additional Equipment:   Intra-op Plan:   Post-operative Plan: Extubation in OR  Informed Consent: I have reviewed the patients History and Physical, chart, labs and discussed the procedure including the risks, benefits and alternatives for the proposed anesthesia with the patient or authorized representative who has indicated his/her understanding and acceptance.   Dental advisory given  Plan Discussed with: CRNA and Anesthesiologist  Anesthesia Plan Comments: (bilat breast Ca, S/P chemo Rx and XRT counts OK Htn Smoker   Plan GA with LMA  Kipp Brood, MD)        Anesthesia Quick Evaluation

## 2013-05-25 NOTE — Op Note (Signed)
05/25/2013  1:07 PM  PATIENT:  Tammy Blanchard  47 y.o. female  PRE-OPERATIVE DIAGNOSIS:  bilateral breast cancer  POST-OPERATIVE DIAGNOSIS:  bilateral breast cancer  PROCEDURE:  Procedure(s): Bilateral Total Mastectomy (Bilateral) Biopsy  nodule on abdomen and right chest wall, and Right neck (N/A)  SURGEON:  Surgeon(s) and Role:    * Robyne Askew, MD - Primary    * Lodema Pilot, DO - Assisting  PHYSICIAN ASSISTANT:   ASSISTANTS: Dr. Biagio Quint   ANESTHESIA:   general  EBL:  Total I/O In: 1600 [I.V.:1600] Out: 175 [Blood:175]  BLOOD ADMINISTERED:none  DRAINS: (2) Jackson-Pratt drain(s) with closed bulb suction in the prepectoral space   LOCAL MEDICATIONS USED:  NONE  SPECIMEN:  Source of Specimen:  bilateral breasts and nodules from right neck, chest wall, and abdominal wall  DISPOSITION OF SPECIMEN:  PATHOLOGY  COUNTS:  YES  TOURNIQUET:  * No tourniquets in log *  DICTATION: .Dragon Dictation After informed consent was obtained the patient was brought to the operating room placed in the supine position on the operating room table. After adequate induction of general anesthesia the patient's bilateral chest, abdomen, and right neck were prepped with ChloraPrep, allowed to dry, and draped in the usual sterile manner. Our attention was first turned to the right breast. An elliptical incision was mapped out around the nipple and areola complex in order to minimize the excess skin. An incision was then made along these lines with a 10 blade knife. The incision was carried through the skin and subcutaneous tissue sharply with electrocautery. Breast hooks were then used to elevate the skin flaps anteriorly towards the ceiling. Thin skin flaps were created circumferentially between the subcutaneous fat and the breast tissue and this dissection was carried all the way to the chest wall. Once this was accomplished the breast was then removed from the pectoralis muscle with the  pectoralis fascia area this was also done sharply with the electrocautery. Once the breast tissue was completely removed it was oriented with a stitch on the lateral aspect and sent to pathology for further evaluation. We did have enough laxity in the skin to take a small skin nodule that was at the edge of the superior flap. This was done sharply with a 15 blade knife. Next a small stab incision was made in the anterior axillary line below the operative area with a 15 blade knife. A hemostat was placed through this opening and used to bring a 19 [place] round Blake drain into the operative bed. The drain was anchored to the skin with a 3-0 nylon stitch. The drain was placed along the axilla and chest wall. The superior and inferior flaps were grossly reapproximated with 3-0 Vicryl subcutaneous stitches. The skin was then closed with staples. The drain was placed to bulb suction and had a good seal. Our attention was then turned to the left breast. A similar elliptical incision was mapped out around the nipple and areola in order to minimize the excess skin. The incision was then made along these lines with a 10 blade knife. The incision was carried through the skin and subcutaneous tissue sharply with electrocautery. Breast hooks were then used to elevate the skin flaps anteriorly towards the ceiling. Thin skin flaps were then created circumferentially between the subcutaneous tissue and the breast tissue until the dissection reached the chest wall. And the breast was then removed from the pectoralis muscle with the pectoralis fascia sharply with the electrocautery. Once the breast  was removed it was oriented with a stitch on the lateral aspect and sent to pathology for further evaluation. Hemostasis was achieved using electrocautery. The wound was irrigated with copious amounts of saline. A small stab incision was made in the anterior axillary line beneath the operative bed with a 15 blade knife. A hemostat was  placed through this opening and used to bring a 19 [place] round Blake drain into the operative bed. The drain was placed along the axilla and chest wall. The superior and inferior flaps were grossly reapproximated with interrupted 3-0 Vicryl subcutaneous stitches. The skin was then closed with staples. The drain was placed to bulb suction there was a good seal. The drain was anchored to the skin with a 3-0 nylon stitch. Next the nodule on the abdominal wall was removed sharply with the 15 blade knife down the subcutaneous tissue. Hemostasis was achieved using electrocautery. The incision was then closed with staples. Attention was then turned to the right neck nodule. This was also removed sharply with a 15 blade knife down to the subcutaneous tissue. Hemostasis was achieved using electrocautery. The incision was then closed with interrupted 3-0 nylon stitches. Xeroform and 4 x 4 gauze were applied to the mastectomy incisions. Sterile dressing was applied to the abdominal incision. Antibiotic ointment and sterile dressings were applied to the scalp lesion on the neck. The patient tolerated the procedure well. At the end of the case all needle sponge and instrument counts were correct. The patient was then awakened and taken to recovery in stable condition.  PLAN OF CARE: Admit for overnight observation  PATIENT DISPOSITION:  PACU - hemodynamically stable.   Delay start of Pharmacological VTE agent (>24hrs) due to surgical blood loss or risk of bleeding: no

## 2013-05-25 NOTE — Progress Notes (Signed)
Report given to philip rn as cargiver 

## 2013-05-25 NOTE — Interval H&P Note (Signed)
History and Physical Interval Note:  05/25/2013 10:35 AM  Tammy Blanchard  has presented today for surgery, with the diagnosis of bilateral breast cancer  The various methods of treatment have been discussed with the patient and family. After consideration of risks, benefits and other options for treatment, the patient has consented to  Procedure(s): bilateral TOTAL MASTECTOMY (Bilateral) BIOPSY nodule on abdomen and right neck (N/A) as a surgical intervention .  The patient's history has been reviewed, patient examined, no change in status, stable for surgery.  I have reviewed the patient's chart and labs.  Questions were answered to the patient's satisfaction.     TOTH III,Wilmot Quevedo S

## 2013-05-25 NOTE — Progress Notes (Signed)
Patient admitted to unit at 16:35. Transported by PACU nurse. Oriented to staff and unit. Dressings CDI and IV infusing well. VS stable. Pt ambulated to bathroom without difficulty. Family at bedside. Will continue to monitor.

## 2013-05-25 NOTE — H&P (View-Only) (Signed)
Subjective:     Patient ID: Tammy Blanchard, female   DOB: 06/30/1966, 47 y.o.   MRN: [id number]  HPI The patient is a 47-year-old black female who is about 3-1/2 years status post right lumpectomy and axillary lymph node dissection for treatment of a right-sided breast cancer. She underwent neoadjuvant therapy prior to this. 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 She has been reimaged and found to have cancer now in both breasts as well as the left axilla. She also has several nodules on her skin both on her abdomen and scalp and right breast that are worrisome for metastatic deposits of breast cancer.  Review of Systems  Constitutional: Positive for fatigue.  HENT: Negative.   Eyes: Negative.   Respiratory: Negative.   Cardiovascular: Negative.   Gastrointestinal: Negative.   Endocrine: Negative.   Genitourinary: Negative.   Musculoskeletal: Negative.   Skin: Negative.   Allergic/Immunologic: Negative.   Neurological: Negative.   Hematological: Negative.   Psychiatric/Behavioral: Negative.        Objective:   Physical Exam  Constitutional: She is oriented to person, place, and time. She appears well-developed and well-nourished.  HENT:  Head: Normocephalic and atraumatic.  Eyes: Conjunctivae and EOM are normal. Pupils are equal, round, and reactive to light.  Neck: Normal range of motion. Neck supple.  Cardiovascular: Normal rate, regular rhythm and normal heart sounds.   Pulmonary/Chest: Effort normal and breath sounds normal.  The patient has a large central mass in the right breast with nipple retraction and scattered skin and subcutaneous nodules. There is also some palpable fullness in the left breast with enlarged left axillary lymph nodes. She also has some firm nodules on her scalp on the right behind her ear  Abdominal: Soft. Bowel sounds are normal. There is no  tenderness.  There is a firm nodule in the skin and subcutaneous tissue measuring about 1 cm along the midabdomen  Musculoskeletal: Normal range of motion.  Lymphadenopathy:    She has no cervical adenopathy.  Neurological: She is alert and oriented to person, place, and time.  Skin: Skin is warm and dry.  Psychiatric: She has a normal mood and affect. Her behavior is normal.       Assessment:     The patient now has stage IV breast cancer with evidence of disease in both breasts. After in depth discussion with the medical oncologist we feel that bilateral mastectomy would be indicated for local control reasons. The oncologist would also like a couple of these skin lesions biopsied to confirm the diagnosis. I've discussed this with the patient in detail including the risks and benefits of surgery as well as some of the technical aspects as well as the fact that we cannot cure this disease. She understands and wishes to proceed     Plan:     Plan for bilateral simple mastectomies and biopsy of the abdominal wall nodule as well as the scalp nodule behind the right ear       

## 2013-05-26 ENCOUNTER — Encounter (HOSPITAL_COMMUNITY): Payer: Self-pay | Admitting: General Practice

## 2013-05-26 MED ORDER — HYDROCODONE-ACETAMINOPHEN 5-325 MG PO TABS: 1.0000 | ORAL_TABLET | ORAL | Status: DC | PRN

## 2013-05-26 NOTE — H&P (Signed)
Patient refusing to finish medical history  

## 2013-05-26 NOTE — Progress Notes (Signed)
Discharge instructions gone over. Home medications gone over. Prescriptions given. Signs and symptoms of infection and reasons to call the doctor gone over. Diet, activity, and incisional care gone over. Drain care gone over and how to empt the drain was demonstrated by the patient. Follow up appointments gone over. Patient verbalized understanding of instructions and was discharged.

## 2013-05-26 NOTE — Progress Notes (Signed)
1 Day Post-Op  Subjective: Nauseated overnight. Hasn't been able to eat anything yet. Tearful this am  Objective: Vital signs in last 24 hours: Temp:  [97.2 F (36.2 C)-98.9 F (37.2 C)] 98.4 F (36.9 C) (07/24 0548) Pulse Rate:  [78-120] 78 (07/24 0548) Resp:  [12-33] 19 (07/24 0548) BP: (101-151)/(68-103) 104/75 mmHg (07/24 0548) SpO2:  [93 %-100 %] 99 % (07/24 0548) Weight:  [167 lb 12.3 oz (76.1 kg)] 167 lb 12.3 oz (76.1 kg) (07/23 1628) Last BM Date: 05/24/13  Intake/Output from previous day: 07/23 0701 - 07/24 0700 In: 1600 [I.V.:1600] Out: 2030 [Urine:1500; Emesis/NG output:200; Drains:155; Blood:175] Intake/Output this shift: Total I/O In: -  Out: 575 [Urine:500; Drains:75]  Chest wall: skin flaps look healthy. drain output serosanguinous  Lab Results:   Recent Labs  05/24/13 1338  WBC 5.2  HGB 12.1  HCT 37.1  PLT 257   BMET  Recent Labs  05/24/13 1338  NA 139  K 3.4*  CO2 26  GLUCOSE 97  BUN 7.0  CREATININE 0.7  CALCIUM 10.9*   PT/INR No results found for this basename: LABPROT, INR,  in the last 72 hours ABG No results found for this basename: PHART, PCO2, PO2, HCO3,  in the last 72 hours  Studies/Results: No results found.  Anti-infectives: Anti-infectives   Start     Dose/Rate Route Frequency Ordered Stop   05/25/13 0600  ceFAZolin (ANCEF) IVPB 2 g/50 mL premix     2 g 100 mL/hr over 30 Minutes Intravenous On call to O.R. 05/24/13 1408 05/25/13 1057      Assessment/Plan: s/p Procedure(s): Bilateral Total Mastectomy (Bilateral) Biopsy  nodule on abdomen and right chest wall, and Right neck (N/A) Advance diet once she is able to tolerate po's May be ready for discharge either later today or tomorrow  LOS: 1 day    TOTH III,Jora Galluzzo S 05/26/2013

## 2013-05-29 ENCOUNTER — Other Ambulatory Visit (HOSPITAL_COMMUNITY): Payer: Self-pay | Admitting: Oncology

## 2013-05-30 ENCOUNTER — Encounter (INDEPENDENT_AMBULATORY_CARE_PROVIDER_SITE_OTHER): Payer: Self-pay | Admitting: Surgery

## 2013-05-30 ENCOUNTER — Ambulatory Visit (INDEPENDENT_AMBULATORY_CARE_PROVIDER_SITE_OTHER): Payer: Medicaid Other | Admitting: Surgery

## 2013-05-30 VITALS — BP 140/110 | HR 84 | Resp 16 | Ht 63.0 in | Wt 157.4 lb

## 2013-05-30 DIAGNOSIS — C50919 Malignant neoplasm of unspecified site of unspecified female breast: Secondary | ICD-10-CM

## 2013-05-30 MED ORDER — TRAMADOL HCL 50 MG PO TABS: 50.0000 mg | ORAL_TABLET | Freq: Four times a day (QID) | ORAL | Status: DC | PRN

## 2013-05-30 NOTE — Progress Notes (Signed)
Subjective:     Patient ID: Tammy Blanchard, female   DOB: 01-19-1966, 47 y.o.   MRN: [id number]  HPI  Tammy Blanchard  08/15/1966 [id number]  Patient Care Team: Cleora Fleet, MD as PCP - General (Family Medicine)  This patient is a 47 y.o.female who presents today for surgical evaluation at the request of self.   Reason for visit: Pain and drain site.  Postop day six status post bilateral mastectomies  Pleasant but anxious female status post mastectomies last week.  Has been trying to avoid narcotics as it makes her sleepy and constipated.  However, feels sharp pulls and stains at her left chest wall surgical drain site.  Drainage output has been 10-30 mL a day.  She has not taken any dressings off yet.  No fevers or chills.  Eating relatively well.  Trying to walk more.  Still uncomfortable to  Patient Active Problem List   Diagnosis Date Noted  . Breast cancer metastasized to bone 05/24/2013  . Vision changes 05/18/2013  . 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 ADVERSE DRUG REACTION 12/05/2010  . MICROSCOPIC HEMATURIA 12/03/2010  . VAGINAL DISCHARGE 12/03/2010  . THYROID MASS 08/02/2009  . HYPERTHYROIDISM 08/02/2009  . ADENOCARCINOMA, RIGHT BREAST 07/06/2009  . JOINT EFFUSION, LEFT KNEE 04/23/2009  . GERD 12/18/2008  . TOBACCO ABUSE 07/26/2008  . REACTIVE AIRWAY DISEASE 07/26/2008  . CALLUSES, FEET, BILATERAL 04/10/2008  . CONSTIPATION 12/02/2007  . SUBSCAPULARIS SPRAIN AND STRAIN 12/02/2007  . ALLERGIC RHINITIS 06/17/2007  . ANEMIA-IRON DEFICIENCY 06/27/2005  . HYPERLIPIDEMIA 02/19/2005  . HYPERTENSION 04/15/2004    Past Medical History  Diagnosis Date  . Hypertension   . Allergy   . GERD (gastroesophageal reflux disease)   . Thyroid disease   . Anemia     resolved 2011  . Hyperlipidemia     controlled  . Cancer 10/2009    breast  . History of blood transfusion 2009    [facility] -  UNKNOWN NUMBER OF UNITS TRANSFUSED    Past Surgical History    Procedure Laterality Date  . Cesarean section    . Wisdom tooth extraction    . Tubal ligation    . Breast surgery  10/2009    right lymp nodes removed  . Left foot surgery    . Abdominal hysterectomy  11/25/2012    Procedure: HYSTERECTOMY ABDOMINAL;  Surgeon: [name], MD;  Location: [facility];  Service: Gynecology;  Laterality: N/A;  with Bilateral Salpingoopherectomy and Cystoscopy  . Mastectomy complete / simple Bilateral 05/25/2013  . Mass biopsy  05/25/2013    on abdomen and right chest wall, and Right neck [name] 05/25/2013  . Total mastectomy Bilateral 05/25/2013    Dr Carolynne Edouard   . Total mastectomy Bilateral 05/25/2013    Procedure: Bilateral Total Mastectomy;  Surgeon: Robyne Askew, MD;  Location: [facility] OR;  Service: General;  Laterality: Bilateral;  . Mass biopsy N/A 05/25/2013    Procedure: Biopsy  nodule on abdomen and right chest wall, and Right neck;  Surgeon: Robyne Askew, MD;  Location: [facility] OR;  Service: General;  Laterality: N/A;    History   Social History  . Marital Status: Widowed    Spouse Name: N/A    Number of Children: N/A  . Years of Education: N/A   Occupational History  . Not on file.   Social History Main Topics  . Smoking status: Current Some Day Smoker -- 0.50 packs/day for 20 years  Types: Cigarettes    Last Attempt to Quit: 11/25/2012  . Smokeless tobacco: Never Used  . Alcohol Use: Yes     Comment: social  . Drug Use: No  . Sexually Active: Yes    Birth Control/ Protection: Surgical   Other Topics Concern  . Not on file   Social History Narrative  . No narrative on file    Family History  Problem Relation Age of Onset  . Diabetes Mother   . Hypertension Mother   . Diabetes Maternal Aunt   . Heart disease Maternal Aunt   . Hypertension Maternal Aunt   . Stroke Maternal Aunt   . Diabetes Maternal Grandmother   . Heart disease Maternal Grandmother   . Hypertension Maternal Grandmother   . Stroke Maternal Grandmother   .  Diabetes Maternal Aunt   . Hypertension Maternal Aunt     Current Outpatient Prescriptions  Medication Sig Dispense Refill  . amLODipine (NORVASC) 10 MG tablet Take 1 tablet (10 mg total) by mouth daily.  30 tablet  3  . calcium carbonate (TUMS - DOSED IN MG ELEMENTAL CALCIUM) 500 MG chewable tablet Chew 1 tablet by mouth daily.      . cloNIDine (CATAPRES) 0.2 MG tablet Take 1 tablet (0.2 mg total) by mouth at bedtime.  30 tablet  3  . docusate sodium (COLACE) 100 MG capsule Take 1 capsule (100 mg total) by mouth 2 (two) times daily as needed for constipation.  30 capsule  0  . letrozole (FEMARA) 2.5 MG tablet Take 1 tablet (2.5 mg total) by mouth daily.  30 tablet  12  . LORazepam (ATIVAN) 0.5 MG tablet Take 1 tablet (0.5 mg total) by mouth every 8 (eight) hours as needed for anxiety (take before every zometa treatment).  30 tablet  0  . losartan (COZAAR) 100 MG tablet Take 1 tablet (100 mg total) by mouth daily.  30 tablet  3  . traMADol (ULTRAM) 50 MG tablet Take 1-2 tablets (50-100 mg total) by mouth every 6 (six) hours as needed for pain.  40 tablet  1   No current facility-administered medications for this visit.     Allergies  Allergen Reactions  . Metronidazole Swelling    BP 140/110  Pulse 84  Resp 16  Ht 5\' 3"  (1.6 m)  Wt 157 lb 6.4 oz (71.396 kg)  BMI 27.89 kg/m2  LMP 11/12/2012  Dg Chest 2 View  05/19/2013   *RADIOLOGY REPORT*  Clinical Data: History of breast cancer.  CHEST - 2 VIEW  Comparison: PET CT scan 05/04/2013 and right body bone scan 7th 16 14.  PA and lateral chest 11/23/2009.  Findings: The lungs are clear.  Heart size is normal.  No pneumothorax or pleural fluid is identified.  Surgical clips right axilla are noted.  Diffuse osteosclerosis consistent with metastatic disease is identified as on the prior PET CT scan.  IMPRESSION:  1.  No acute disease. 2.  Diffuse osteosclerosis consistent with metastatic disease.   Original Report Authenticated By: Holley Dexter, M.D.   Mr Laqueta Jean Wo Contrast  05/04/2013   *RADIOLOGY REPORT*  Clinical Data: New diagnosis of breast cancer.  Staging.  MRI HEAD WITHOUT AND WITH CONTRAST  Technique:  Multiplanar, multiecho pulse sequences of the brain and surrounding structures were obtained according to standard protocol without and with intravenous contrast  Contrast: 14mL MULTIHANCE GADOBENATE DIMEGLUMINE 529 MG/ML IV SOLN  Comparison: CT head without contrast 08/22/2008.  Findings: A remote infarct  is present within the right lentiform nucleus and caudate head.  Minimal white matter disease is present otherwise.  No acute infarct, hemorrhage, or mass lesion is present.  The postcontrast images demonstrate no evidence for metastatic disease of the brain or meninges.  There is diffuse T1 marrow suppression in the calvarium and upper cervical spine with heterogeneity suggesting diffuse bony metastases.  Flow is present in the major intracranial arteries.  The globes and orbits are intact.  The paranasal sinuses and mastoid air cells are clear.  IMPRESSION:  1.  Diffuse osseous metastases are evident in the upper cervical spine, skull base, and calvarium. 2.  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 3.  No evidence for metastatic disease to the brain or meninges. 4.  No acute intracranial abnormality.   Original Report Authenticated By: Marin Roberts, M.D.   Nm Bone Scan Whole Body  05/18/2013   *RADIOLOGY REPORT*  Clinical Data: Metastatic breast cancer.  NUCLEAR MEDICINE WHOLE BODY BONE SCINTIGRAPHY  Technique:  Whole body anterior and posterior images were obtained approximately 3 hours after intravenous injection of radiopharmaceutical.  Radiopharmaceutical: 24.4MILLI CURIE TC-MDP TECHNETIUM TC 17M MEDRONATE IV KIT  Comparison: Whole body bone scan 06/27/2009.  PET CT 05/04/2013.  Findings: The uptake within the axial and appendicular skeleton remains homogeneous and within normal limits.  No  areas of focally increased or decreased activity are demonstrated to correspond with the diffuse sclerotic lesions on CT.  These are new from a chest CT performed 06/27/2009 and likely indicate treated osseous metastases.  The soft tissue activity is stable with mild collecting system fullness in the left kidney.  IMPRESSION: No scintigraphic evidence of osseous metastatic disease.  CTs demonstrate diffuse osteosclerosis most consistent with treated metastatic disease.   Original Report Authenticated By: Carey Bullocks, M.D.   Nm Pet Image Restag (ps) Skull Base To Thigh  05/04/2013   *RADIOLOGY REPORT*  Clinical Data: Subsequent treatment strategy for breast cancer.  NUCLEAR MEDICINE PET SKULL BASE TO THIGH  Fasting Blood Glucose:  106  Technique:  15.9 mCi F-18 FDG was injected intravenously. CT data was obtained and used for attenuation correction and anatomic localization only.  (This was not acquired as a diagnostic CT examination.) Additional exam technical data entered on technologist worksheet.  Comparison:  12/09/2012  Findings:  Neck: No hypermetabolic lymph nodes in the neck.  Chest:  No hypermetabolic mediastinal or hilar nodes.  Right axillary lymph node dissection.  Lymph node with normal fatty hilum in the left axilla is notable for mild hypermetabolism max SUV 3.2 (PET image 65) but is favored to be reactive.  Vague hypermetabolism within glandular tissue in the central breast bilaterally, max SUV 4.0 on the right and 4.1 on the left.  No suspicious pulmonary nodules on the CT scan.  Mosaic attenuation.  Abdomen/Pelvis:  No abnormal hypermetabolic activity within the liver, pancreas, adrenal glands, or spleen.  No hypermetabolic lymph nodes in the abdomen or pelvis.  Possible mild hypermetabolism in the porta hepatis (PET image 125) is without a definite CT correlate.  Prior hypermetabolism in the bilateral adnexal regions has resolved, likely postsurgical.  3.4 x 2.8 cm seroma versus lymphocele in  the left pelvis (series 2/image 197), grossly unchanged.  Hepatic steatosis.  Mild left hydronephrosis, unchanged.  Skeleton:  Diffuse sclerotic osseous metastases without focal hypermetabolism.  IMPRESSION: Diffuse sclerotic osseous metastases without focal hypermetabolism.  Otherwise, no evidence of metastatic disease.  Prior hypermetabolism in the bilateral adnexal regions has  resolved, likely postsurgical.   Original Report Authenticated By: Charline Bills, M.D.     Review of Systems  Constitutional: Negative for fever, chills and diaphoresis.  HENT: Negative for ear pain, sore throat and trouble swallowing.   Eyes: Negative for photophobia and visual disturbance.  Respiratory: Negative for cough and choking.   Cardiovascular: Negative for palpitations and leg swelling.  Gastrointestinal: Negative for nausea, vomiting, abdominal pain, diarrhea, constipation, anal bleeding and rectal pain.  Genitourinary: Negative for dysuria, frequency and difficulty urinating.  Musculoskeletal: Negative for myalgias and gait problem.  Skin: Negative for color change, pallor and rash.  Neurological: Negative for dizziness, speech difficulty, weakness and numbness.  Hematological: Negative for adenopathy.  Psychiatric/Behavioral: Negative for confusion and agitation. The patient is not nervous/anxious.        Objective:   Physical Exam  Constitutional: She is oriented to person, place, and time. She appears well-developed and well-nourished. No distress.  HENT:  Head: Normocephalic.    Mouth/Throat: Oropharynx is clear and moist. No oropharyngeal exudate.  Eyes: Conjunctivae and EOM are normal. Pupils are equal, round, and reactive to light. No scleral icterus.  Neck: Normal range of motion. No tracheal deviation present.  Cardiovascular: Normal rate and intact distal pulses.   Pulmonary/Chest: Effort normal. No respiratory distress. She exhibits tenderness. She exhibits no mass.    Abdominal:  Soft. She exhibits no distension. There is no tenderness. Hernia confirmed negative in the right inguinal area and confirmed negative in the left inguinal area.    Incisions clean with normal healing ridges.  No hernias  Genitourinary: No vaginal discharge found.  Musculoskeletal: Normal range of motion. She exhibits no tenderness.  Lymphadenopathy:       Right: No inguinal adenopathy present.       Left: No inguinal adenopathy present.  Neurological: She is alert and oriented to person, place, and time. No cranial nerve deficit. She exhibits normal muscle tone. Coordination normal.  Skin: Skin is warm and dry. No rash noted. She is not diaphoretic.  Psychiatric: Her speech is normal and behavior is normal. Judgment and thought content normal. Her mood appears anxious. Cognition and memory are normal.  Consolable       Assessment:     Tenderness at drain site to do an adequate pain control.  No evidence of cellulitis/abscess     Plan:     Increase activity as tolerated to regular activity.  Low impact exercise such as walking an hour a day at least ideal.  Do not push through pain.  Improve pain control with ice/heat.  Switch narcotics to tramadol.  Two Tylenol around the clock.  If not better, consider naproxen since far enough out.  I noted sensitivity around the drain is normal.  Consider protecting it better.  Too soon to remove right now, but will defer to her surgeon on timing of removal of drain.  Followup pathology when results are available.  Suspect that will happen with multidisciplinary breast tumor board in two days just prior to her appointment  Began physical therapy with arm and chest wall exercises as recommended.  Keep the appointment with her surgeon in two days.  Otherwise, return to clinic as needed.   Instructions discussed.  Followup with primary care physician for other health issues as would normally be done.  Questions answered.  The patient expressed  understanding and appreciation

## 2013-05-30 NOTE — Patient Instructions (Addendum)
Managing Pain  Pain after surgery or related to activity is often due to strain/injury to muscle, tendon, nerves and/or incisions.  This pain is usually short-term and will improve in a few months.   Many people find it helpful to do the following things TOGETHER to help speed the process of healing and to get back to regular activity more quickly:  1. Avoid heavy physical activity a.  no lifting greater than 20 pounds b. Do not "push through" the pain.  Listen to your body and avoid positions and maneuvers than reproduce the pain c. Walking is okay as tolerated, but go slowly and stop when getting sore.  d. Remember: If it hurts to do it, then don't do it! 2. Take Anti-inflammatory medication  a. Take with food/snack around the clock for 1-2 weeks i. This helps the muscle and nerve tissues become less irritable and calm down faster b. Choose ONE of the following over-the-counter medications: i. Naproxen 220mg  tabs (ex. Aleve) 1-2 pills twice a day  ii. Ibuprofen 200mg  tabs (ex. Advil, Motrin) 3-4 pills with every meal and just before bedtime iii. Acetaminophen 500mg  tabs (Tylenol) 1-2 pills with every meal and just before bedtime 3. Use a Heating pad or Ice/Cold Pack a. 4-6 times a day b. May use warm bath/hottub  or showers 4. Try Gentle Massage and/or Stretching  a. at the area of pain many times a day b. stop if you feel pain - do not overdo it  Try these steps together to help you body heal faster and avoid making things get worse.  Doing just one of these things may not be enough.    If you are not getting better after two weeks or are noticing you are getting worse, contact our office for further advice; we may need to re-evaluate you & see what other things we can do to help.  Total or Modified Radical Mastectomy  Care After Refer to this sheet in the next few weeks. These instructions provide you with information on caring for yourself after your procedure. Your caregiver may  also give you more specific instructions. Your treatment has been planned according to current medical practices, but problems sometimes occur. Call your caregiver if you have any problems or questions after your procedure. ACTIVITY  Your caregiver will advise you when you may resume strenuous activities, driving, and sports.  After the drain(s) are removed, you may do light housework. Avoid heavy lifting, carrying, or pushing. You should not be lifting anything heavier than 5 lbs.  Take frequent rest periods. You may tire more easily than usual.  Always rest and elevate the arm affected by your surgery for a period of time equal to your activity time.  Continue doing the exercises given to you by the physical therapist/occupational therapist even after full range of motion has returned. The amount of time this takes will vary from person to person.  After normal range of motion has returned, some stiffness and soreness may persist for 2-3 months. This is normal and will subside.  Begin sports or strenuous activities in moderation. This will give you a chance to rebuild your endurance. Continue to be cautious of heavy lifting or carrying (no more than 10 lbs.) with your affected arm.  You may return to work as recommended by your caregiver. NUTRITION  You may resume your normal diet.  Make sure you drink plenty of fluids (6-8 glasses a day).  Eat a well-balanced diet. Including daily portions of food  from government recommended food groups:  Grains.  Vegetables.  Fruits.  Milk.  Meat & beans.  Oils. Visit [employer] for more information HYGIENE  You may wash your hair.  If your incision (cut from surgery) is closed, you may shower or tub bathe, unless instructed otherwise by your doctor. FEVER  If you feel feverish or have shaking chills, take your temperature. If your temperature is 102 F (38.9 C) or above, call your caregiver. The fever may mean there is an  infection.  If you call early, infection can be treated with antibiotics and hospitalization may be avoided. PAIN CONTROL  Mild discomfort may occur.  You may need to take an over-the-counter pain medication or a medication prescribed by your caregiver.  Call your caregiver if you experience increased pain. INCISION CARE  Check your incision daily for increased redness, drainage, swelling, or separation of skin.  Call your caregiver if any of the above are noted. ARM AND HAND CARE  If the lymph nodes under your arm were removed with a modified radical mastectomy, there may be a greater tendency for the arm to swell.  Try to avoid having blood pressures taken, blood drawn, or injections given in the affected arm. This is the arm on the same side as the surgery.  Use hand lotion to soften cuticles instead of cutting them to avoid cutting yourself.  Be careful when shaving your under arms. Use an electric shaver if possible. You may use a deodorant after the incision has completely healed. Until then, clean under your arms with hydrogen peroxide.  Use reasonable precaution when cooking, sewing, and gardening to avoid burning or needle or thorn pricks.  Do not weigh your arm straight down with a package or your purse.  Follow the exercises and instructions given to you by the physical therapist/occupational therapist and your caregiver. FOLLOW-UP APPOINTMENT Call your caregiver for a follow-up appointment as directed. PROSTHESIS INFORMATION Wear your temporary prosthesis (artificial breast) until your caregiver gives you permission to purchase a permanent one. This will depend upon your rate of healing. We suggest you also wait until you are physically and emotionally ready to shop for one. The suitability depends on several individual factors. We do not endorse any particular prosthesis, but suggest you try several until you are satisfied with appearance and fit. A list of stores may be  obtained from your local [employer] at www.cancer.org or 1-800-ACS-2345 (1-323-641-7114). A permanent prosthesis is medically necessary to restore balance. It is also income tax deductible. Be sure all receipts are marked "surgical". It is not essential to purchase a bra. You may sew a pocket into your regular bra. Note: Remember to take all of your medical insurance information with you when shopping for your prosthesis. SELECTING A PROSTHESIS FITTER You may want to ask the following questions when selecting a fitter:  What styles and brands of forms are carried in stock?  How long have the forms been on the market and have there been any problems with them?  Why would one form be better than another?  How long should a particular form last?  May I wear the form for a trial period without obligation?  Do the forms require a prosthetic bra? If so, what is the price range? Must I always wear that style?  If alterations to the bra are necessary, can they be done at this location or be sent out?  Will I be charged for alterations?  Will I receive suggestions  on how to alter my own wardrobe, if necessary?  Will you special order forms or bras if necessary?  Are fitters always available to meet my needs?  What kinds of garments should be worn for the fitting?  Are lounge wear, swim wear, and accessories available?  If I have insurance coverage or Medicare, will you suggest ways for processing the paper work?  Do you keep complete records so that mail reordering is possible?  How are warranty claims handled if I have a problem with the form? Document Released: 06/12/2004 Document Revised: 01/12/2012 Document Reviewed: 02/15/2008 [facility] Patient Information 2014 [place], [place].

## 2013-06-01 ENCOUNTER — Ambulatory Visit (INDEPENDENT_AMBULATORY_CARE_PROVIDER_SITE_OTHER): Payer: Medicaid Other | Admitting: General Surgery

## 2013-06-01 ENCOUNTER — Encounter (INDEPENDENT_AMBULATORY_CARE_PROVIDER_SITE_OTHER): Payer: Self-pay | Admitting: General Surgery

## 2013-06-01 VITALS — BP 130/90 | HR 72 | Temp 98.2°F | Resp 15 | Ht 63.0 in | Wt 155.2 lb

## 2013-06-01 DIAGNOSIS — C7952 Secondary malignant neoplasm of bone marrow: Secondary | ICD-10-CM

## 2013-06-01 DIAGNOSIS — C50919 Malignant neoplasm of unspecified site of unspecified female breast: Secondary | ICD-10-CM

## 2013-06-01 DIAGNOSIS — C7951 Secondary malignant neoplasm of bone: Secondary | ICD-10-CM

## 2013-06-01 NOTE — Patient Instructions (Signed)
Continue to record drain output

## 2013-06-02 ENCOUNTER — Encounter (INDEPENDENT_AMBULATORY_CARE_PROVIDER_SITE_OTHER): Payer: Self-pay | Admitting: General Surgery

## 2013-06-02 NOTE — Progress Notes (Signed)
Subjective:     Patient ID: Tammy Blanchard, female   DOB: 1966-09-06, 47 y.o.   MRN: [id number]  HPI The pt is a 47 yo bf who is one week s/p bilateral mastectomy and biopsy of skin lesions for metastatic breast cancer. The chest wall pain she is having is gradually improving. Her right drain is putting out about 20cc per day and the left at least 30cc per day. Her pathology showed clean margins but the skin lesions on the breast and scalp were metastatic. The skin lesion on the abdomen was benign  Review of Systems     Objective:   Physical Exam On exam, her mastectomy incisions are healing nicely without sign of infection or seroma. Her skin flaps are healthy. The stitches from the scalp and the staples from the abdominal wall were removed without difficulty    Assessment:     The patient is one week s/p bilateral mastectomy     Plan:     At this point I would like to leave the drains in a little bit longer. I will plan to see her back early this week to remove drains and staples

## 2013-06-06 ENCOUNTER — Ambulatory Visit: Payer: No Typology Code available for payment source | Admitting: Obstetrics & Gynecology

## 2013-06-06 ENCOUNTER — Encounter (INDEPENDENT_AMBULATORY_CARE_PROVIDER_SITE_OTHER): Payer: Self-pay | Admitting: General Surgery

## 2013-06-06 ENCOUNTER — Ambulatory Visit (INDEPENDENT_AMBULATORY_CARE_PROVIDER_SITE_OTHER): Payer: Medicaid Other | Admitting: General Surgery

## 2013-06-06 VITALS — BP 122/80 | HR 72 | Resp 14 | Ht 63.0 in | Wt 157.8 lb

## 2013-06-06 DIAGNOSIS — C50919 Malignant neoplasm of unspecified site of unspecified female breast: Secondary | ICD-10-CM

## 2013-06-06 DIAGNOSIS — C7989 Secondary malignant neoplasm of other specified sites: Secondary | ICD-10-CM

## 2013-06-06 DIAGNOSIS — C779 Secondary and unspecified malignant neoplasm of lymph node, unspecified: Secondary | ICD-10-CM

## 2013-06-06 NOTE — Patient Instructions (Signed)
Will refer to physical therapy 

## 2013-06-07 NOTE — Progress Notes (Signed)
Subjective:     Patient ID: Tammy Blanchard, female   DOB: 10-09-1966, 47 y.o.   MRN: [id number]  HPI The patient is a 47 year old white female who is 2 weeks status post bilateral mastectomies and biopsy of several skin lesions for metastatic breast cancer. Both of her drains are putting out less than 30 cc a day. Her shortness is gradually improving. She has no complaints today.  Review of Systems     Objective:   Physical Exam On exam both mastectomy incisions are healing nicely with no sign of infection or seroma. Her skin flaps are healthy. Both drains were removed and she tolerated this well. Her staples were also removed and she tolerated this well.    Assessment:     The patient is 2 weeks status post bilateral mastectomies for metastatic breast cancer     Plan:     At this point I would like to see her back in about 2 weeks to check for any residual fluid buildup. She will continue to follow with her medical oncologist for further adjuvant therapy

## 2013-06-10 ENCOUNTER — Other Ambulatory Visit: Payer: Self-pay | Admitting: *Deleted

## 2013-06-10 ENCOUNTER — Ambulatory Visit (HOSPITAL_BASED_OUTPATIENT_CLINIC_OR_DEPARTMENT_OTHER): Payer: No Typology Code available for payment source | Admitting: Oncology

## 2013-06-10 ENCOUNTER — Other Ambulatory Visit: Payer: No Typology Code available for payment source | Admitting: Lab

## 2013-06-10 VITALS — BP 117/86 | HR 91 | Temp 98.6°F | Resp 20 | Ht 63.0 in | Wt 161.2 lb

## 2013-06-10 DIAGNOSIS — C50419 Malignant neoplasm of upper-outer quadrant of unspecified female breast: Secondary | ICD-10-CM

## 2013-06-10 DIAGNOSIS — Z5111 Encounter for antineoplastic chemotherapy: Secondary | ICD-10-CM

## 2013-06-10 DIAGNOSIS — C50411 Malignant neoplasm of upper-outer quadrant of right female breast: Secondary | ICD-10-CM

## 2013-06-10 DIAGNOSIS — C50919 Malignant neoplasm of unspecified site of unspecified female breast: Secondary | ICD-10-CM

## 2013-06-10 DIAGNOSIS — C7951 Secondary malignant neoplasm of bone: Secondary | ICD-10-CM

## 2013-06-10 LAB — CBC WITH DIFFERENTIAL/PLATELET
Basophils Absolute: 0 10*3/uL (ref 0.0–0.1)
Eosinophils Absolute: 0.3 10*3/uL (ref 0.0–0.5)
HGB: 11.7 g/dL (ref 11.6–15.9)
LYMPH%: 40.5 % (ref 14.0–49.7)
MCV: 86 fL (ref 79.5–101.0)
MONO#: 0.4 10*3/uL (ref 0.1–0.9)
NEUT#: 2.3 10*3/uL (ref 1.5–6.5)
Platelets: 313 10*3/uL (ref 145–400)
RBC: 4.22 10*6/uL (ref 3.70–5.45)
WBC: 4.9 10*3/uL (ref 3.9–10.3)
nRBC: 0 % (ref 0–0)

## 2013-06-10 LAB — COMPREHENSIVE METABOLIC PANEL (CC13)
ALT: 72 U/L — ABNORMAL HIGH (ref 0–55)
CO2: 21 mEq/L — ABNORMAL LOW (ref 22–29)
Calcium: 9.4 mg/dL (ref 8.4–10.4)
Chloride: 108 mEq/L (ref 98–109)
Creatinine: 0.6 mg/dL (ref 0.6–1.1)
Glucose: 100 mg/dl (ref 70–140)
Sodium: 138 mEq/L (ref 136–145)
Total Protein: 7 g/dL (ref 6.4–8.3)

## 2013-06-10 MED ORDER — FULVESTRANT 250 MG/5ML IM SOLN
500.0000 mg | Freq: Once | INTRAMUSCULAR | Status: AC
  Administered 2013-06-10: 500 mg via INTRAMUSCULAR
  Filled 2013-06-10: qty 10

## 2013-06-10 NOTE — Progress Notes (Signed)
ID: Tammy Blanchard   DOB: Feb 20, 1966  MR#: [id number]  BJY#:[id number]  PCP: [name], MD SU: Chevis Pretty, MD GYN: [name]  HISTORY OF PRESENT ILLNESS: The patient had screening mammography at the [facility] February 14, 2008 showing dense breasts with microcalcifications in both breasts, so she was called back for bilateral diagnostic mammograms February 18, 2008.  These showed diffuse calcifications, particularly in the lateral aspect of the right breast.  They were felt by Dr. Judyann Munson to be probably benign bilaterally, but to require short interval follow-up.  So she was set up for a six-month mammogram, which she did not show up for.  She also did not return for her April mammography this year.    However, in July, she had discomfort in the right breast and palpated a mass, which she said was also visible to her.  She brought this to the attention of Dr. Delrae Alfred at [facility], and was set up for diagnostic mammography at the [facility] on May 25, 2009.  This again showed dense breasts, but there was now an area of increased density and architectural distortion in the upper-outer right breast, corresponding to the mass palpated by the patient.  Dr. Azucena Kuba was able to palpate the mass as well, and it measured 3.0 cm by ultrasound, being irregularly marginated and inhomogeneous.  In the left breast there was a cluster of microcalcifications, but no ultrasonographic finding.  A decision was made to biopsy both breasts, and this was done on August 2.  The pathology ([id number]) showed in the right a high-grade invasive ductal carcinoma.  On the left side there was only atypical ductal hyperplasia.  The invasive right-sided tumor was ER+ at 98%, PR+ at 96+, with an MIB-1 of 44% and was negative for HER-2 amplification by CISH with a ratio of 0.97.   With this information, the patient was referred to Dr. Carolynne Edouard, and bilateral breast MRIs were obtained August 9.   This showed on the right an irregular enhancing mass measuring up to 4.6 cm (including a small anterior nodular component, which extends within 8 mm of the nipple).  There were no other areas of abnormal enhancement in either breast, and no abnormal appearing lymph nodes bilaterally.    The patient received neoadjuvant chemotherapy consisting of 6 q. three-week doses of docetaxel/ doxorubicin/ cyclophosphamide, completed in December 2010. 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 3 of 10 lymph nodes were positive. Tumor was strongly ER, PR positive and HER-2/neu negative with a high proliferation fraction. Her subsequent history is as detailed below.  INTERVAL HISTORY: Tammy Blanchard returns today accompanied by her significant other Leveda Anna for follow up of her metastatic breast cancer. Since her last visit here she had bilateral mastectomies and the removal of 3 skin nodules, 2 of which proved to be metastatic disease.   REVIEW OF SYSTEMS: [name] is still hurting from the surgery, but the pain is already much better than yesterday she only took 1 hydrocodone/APAP. These pills then to make her sleepy and not feel good so she tries to avoid them. The pain is currently approximately 2-4/10 She is constipated and is taking MiraLAX and stool softeners for this, having and not hard bowel movement every couple of days at present. Aside from these issues she has a sensation of tightness in her right upper  arm which can shoot down to her hand causing some tingling. There is no weakness that she has noted that she has soreness on her chest wall. There has been no fever, no bleeding, no rash. She has noted a small bump in and behind her right ear and a couple of bumps on her scalp that she wonders if they might be also breast cancer spread the detailed review of systems today was  otherwise noncontributory   PAST MEDICAL HISTORY: Past Medical History  Diagnosis Date  . Hypertension   . Allergy   . GERD (gastroesophageal reflux disease)   . Thyroid disease   . Anemia     resolved 2011  . Hyperlipidemia     controlled  . Cancer 10/2009    breast  . History of blood transfusion 2009    [facility] -  UNKNOWN NUMBER OF UNITS TRANSFUSED    PAST SURGICAL HISTORY: Past Surgical History  Procedure Laterality Date  . Cesarean section    . Wisdom tooth extraction    . Tubal ligation    . Breast surgery  10/2009    right lymp nodes removed  . Left foot surgery    . Abdominal hysterectomy  11/25/2012    Procedure: HYSTERECTOMY ABDOMINAL;  Surgeon: [name], MD;  Location: [facility];  Service: Gynecology;  Laterality: N/A;  with Bilateral Salpingoopherectomy and Cystoscopy  . Mastectomy complete / simple Bilateral 05/25/2013  . Mass biopsy  05/25/2013    on abdomen and right chest wall, and Right neck [name] 05/25/2013  . Total mastectomy Bilateral 05/25/2013    Dr Carolynne Edouard   . Total mastectomy Bilateral 05/25/2013    Procedure: Bilateral Total Mastectomy;  Surgeon: Robyne Askew, MD;  Location: [facility] OR;  Service: General;  Laterality: Bilateral;  . Mass biopsy N/A 05/25/2013    Procedure: Biopsy  nodule on abdomen and right chest wall, and Right neck;  Surgeon: Robyne Askew, MD;  Location: [facility] OR;  Service: General;  Laterality: N/A;    FAMILY HISTORY Family History  Problem Relation Age of Onset  . Diabetes Mother   . Hypertension Mother   . Diabetes Maternal Aunt   . Heart disease Maternal Aunt   . Hypertension Maternal Aunt   . Stroke Maternal Aunt   . Diabetes Maternal Grandmother   . Heart disease Maternal Grandmother   . Hypertension Maternal Grandmother   . Stroke Maternal Grandmother   . Diabetes Maternal Aunt   . Hypertension Maternal Aunt     GYNECOLOGIC HISTORY: updated OCT 2013 She is GX P4, first pregnancy at age 36.  She still having  periods, although irregularly. The most recent one occurred earlier this month.  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 She  was approved for disability  May of 2014. She is widowed.  She tells me her husband was hit by [place]. Her children are [name], who lives in [place],  and  is currently unemployed. He has a 33-year-old daughter. The patient's daughter [name],  has 3 children. She lives in [place]. Son [name], has one child. He is Social research officer, government of [employer] here in [place]. Son [name],  is a Paediatric nurse. He has one daughter. He lives in [place].The patient's significant other, Leveda Anna, works for [employer].  The patient is a member of [employer].     ADVANCED DIRECTIVES: Not in place  HEALTH MAINTENANCE: History  Substance Use Topics  .  Smoking status: Current Some Day Smoker -- 0.50 packs/day for 20 years    Types: Cigarettes    Last Attempt to Quit: 11/25/2012  . Smokeless tobacco: Never Used  . Alcohol Use: Yes     Comment: social     Colonoscopy:  PAP:  Bone density:  Lipid panel:  Allergies  Allergen Reactions  . Metronidazole Swelling    Current Outpatient Prescriptions  Medication Sig Dispense Refill  . amLODipine (NORVASC) 10 MG tablet Take 1 tablet (10 mg total) by mouth daily.  30 tablet  3  . calcium carbonate (TUMS - DOSED IN MG ELEMENTAL CALCIUM) 500 MG chewable tablet Chew 1 tablet by mouth daily.      . cloNIDine (CATAPRES) 0.2 MG tablet Take 1 tablet (0.2 mg total) by mouth at bedtime.  30 tablet  3  . docusate sodium (COLACE) 100 MG capsule Take 1 capsule (100 mg total) by mouth 2 (two) times daily as needed for constipation.  30 capsule  0  . letrozole (FEMARA) 2.5 MG tablet Take 1 tablet (2.5 mg total) by mouth daily.  30 tablet  12  . LORazepam (ATIVAN) 0.5 MG tablet Take 1 tablet (0.5 mg total) by mouth every 8 (eight) hours as needed for anxiety (take before  every zometa treatment).  30 tablet  0  . losartan (COZAAR) 100 MG tablet Take 1 tablet (100 mg total) by mouth daily.  30 tablet  3  . traMADol (ULTRAM) 50 MG tablet Take 1-2 tablets (50-100 mg total) by mouth every 6 (six) hours as needed for pain.  40 tablet  1   No current facility-administered medications for this visit.   Facility-Administered Medications Ordered in Other Visits  Medication Dose Route Frequency Provider Last Rate Last Dose  . fulvestrant (FASLODEX) injection 500 mg  500 mg Intramuscular Once Lowella Dell, MD        OBJECTIVE: Middle-aged [place] American woman who appears stated age 18 Vitals:   06/10/13 0908  BP: 117/86  Pulse: 91  Temp: 98.6 F (37 C)  Resp: 20     Body mass index is 28.56 kg/(m^2).    ECOG FS: 1 Filed Weights   06/10/13 0908  Weight: 161 lb 3.2 oz (73.12 kg)   Sclerae unicteric Oropharynx clear There is a 3 mm subcutaneous "bump" behind the right ear, with no erythema or tenderness. No cervical or supraclavicular adenopathy Lungs clear to auscultation, no wheezes, no rales or rhonchi Heart regular rate and rhythm Abdomen soft, positive bowel sounds,  no tenderness;  MSK no focal spinal tenderness, no peripheral edema Neuro: nonfocal, well oriented, appropriate affect Breasts: Status post bilateral mastectomies. The incisions are healing very nicely. Steri-Strips are still in place. There is a small amount of redundant tissue in the left axilla, which is also somewhat sore. There is no erythema or swelling.    LAB RESULTS: Lab Results  Component Value Date   WBC 4.9 06/10/2013   NEUTROABS 2.3 06/10/2013   HGB 11.7 06/10/2013   HCT 36.3 06/10/2013   MCV 86.0 06/10/2013   PLT 313 06/10/2013      Chemistry      Component Value Date/Time   NA 138 06/10/2013 0856   NA 138 05/19/2013 0931   K 3.9 06/10/2013 0856   K 3.9 05/19/2013 0931   CL 104 05/19/2013 0931   CL 106 04/26/2013 1444   CO2 21* 06/10/2013 0856   CO2 23 05/19/2013 0931    BUN 10.5 06/10/2013 0856  BUN 8 05/19/2013 0931   CREATININE 0.6 06/10/2013 0856   CREATININE 0.42* 05/19/2013 0931   CREATININE 0.42* 01/03/2013 1454      Component Value Date/Time   CALCIUM 9.4 06/10/2013 0856   CALCIUM 10.1 05/19/2013 0931   ALKPHOS 168* 06/10/2013 0856   ALKPHOS 116 02/19/2012 0949   AST 36* 06/10/2013 0856   AST 25 02/19/2012 0949   ALT 72* 06/10/2013 0856   ALT 27 02/19/2012 0949   BILITOT 0.30 06/10/2013 0856   BILITOT 0.6 02/19/2012 0949       STUDIES:  Dg Chest 2 View  05/19/2013   *RADIOLOGY REPORT*  Clinical Data: History of breast cancer.  CHEST - 2 VIEW  Comparison: PET CT scan 05/04/2013 and right body bone scan 7th 16 14.  PA and lateral chest 11/23/2009.  Findings: The lungs are clear.  Heart size is normal.  No pneumothorax or pleural fluid is identified.  Surgical clips right axilla are noted.  Diffuse osteosclerosis consistent with metastatic disease is identified as on the prior PET CT scan.  IMPRESSION:  1.  No acute disease. 2.  Diffuse osteosclerosis consistent with metastatic disease.   Original Report Authenticated By: Holley Dexter, M.D.   Mr Laqueta Jean Wo Contrast  05/04/2013   *RADIOLOGY REPORT*  Clinical Data: New diagnosis of breast cancer.  Staging.  MRI HEAD WITHOUT AND WITH CONTRAST  Technique:  Multiplanar, multiecho pulse sequences of the brain and surrounding structures were obtained according to standard protocol without and with intravenous contrast  Contrast: 14mL MULTIHANCE GADOBENATE DIMEGLUMINE 529 MG/ML IV SOLN  Comparison: CT head without contrast 08/22/2008.  Findings: A remote infarct is present within the right lentiform nucleus and caudate head.  Minimal white matter disease is present otherwise.  No acute infarct, hemorrhage, or mass lesion is present.  The postcontrast images demonstrate no evidence for metastatic disease of the brain or meninges.  There is diffuse T1 marrow suppression in the calvarium and upper cervical spine with heterogeneity  suggesting diffuse bony metastases.  Flow is present in the major intracranial arteries.  The globes and orbits are intact.  The paranasal sinuses and mastoid air cells are clear.  IMPRESSION:  1.  Diffuse osseous metastases are evident in the upper cervical spine, skull base, and calvarium. 2.  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 3.  No evidence for metastatic disease to the brain or meninges. 4.  No acute intracranial abnormality.   Original Report Authenticated By: Marin Roberts, M.D.   Nm Bone Scan Whole Body  05/18/2013   *RADIOLOGY REPORT*  Clinical Data: Metastatic breast cancer.  NUCLEAR MEDICINE WHOLE BODY BONE SCINTIGRAPHY  Technique:  Whole body anterior and posterior images were obtained approximately 3 hours after intravenous injection of radiopharmaceutical.  Radiopharmaceutical: 24.4MILLI CURIE TC-MDP TECHNETIUM TC 36M MEDRONATE IV KIT  Comparison: Whole body bone scan 06/27/2009.  PET CT 05/04/2013.  Findings: The uptake within the axial and appendicular skeleton remains homogeneous and within normal limits.  No areas of focally increased or decreased activity are demonstrated to correspond with the diffuse sclerotic lesions on CT.  These are new from a chest CT performed 06/27/2009 and likely indicate treated osseous metastases.  The soft tissue activity is stable with mild collecting system fullness in the left kidney.  IMPRESSION: No scintigraphic evidence of osseous metastatic disease.  CTs demonstrate diffuse osteosclerosis most consistent with treated metastatic disease.   Original Report Authenticated By: Carey Bullocks, M.D.    Nm Pet  Image Restag (ps) Skull Base To Thigh  05/04/2013   *RADIOLOGY REPORT*  Clinical Data: Subsequent treatment strategy for breast cancer.  NUCLEAR MEDICINE PET SKULL BASE TO THIGH  Fasting Blood Glucose:  106  Technique:  15.9 mCi F-18 FDG was injected intravenously. CT data was obtained and used for attenuation correction  and anatomic localization only.  (This was not acquired as a diagnostic CT examination.) Additional exam technical data entered on technologist worksheet.  Comparison:  12/09/2012  Findings:  Neck: No hypermetabolic lymph nodes in the neck.  Chest:  No hypermetabolic mediastinal or hilar nodes.  Right axillary lymph node dissection.  Lymph node with normal fatty hilum in the left axilla is notable for mild hypermetabolism max SUV 3.2 (PET image 65) but is favored to be reactive.  Vague hypermetabolism within glandular tissue in the central breast bilaterally, max SUV 4.0 on the right and 4.1 on the left.  No suspicious pulmonary nodules on the CT scan.  Mosaic attenuation.  Abdomen/Pelvis:  No abnormal hypermetabolic activity within the liver, pancreas, adrenal glands, or spleen.  No hypermetabolic lymph nodes in the abdomen or pelvis.  Possible mild hypermetabolism in the porta hepatis (PET image 125) is without a definite CT correlate.  Prior hypermetabolism in the bilateral adnexal regions has resolved, likely postsurgical.  3.4 x 2.8 cm seroma versus lymphocele in the left pelvis (series 2/image 197), grossly unchanged.  Hepatic steatosis.  Mild left hydronephrosis, unchanged.  Skeleton:  Diffuse sclerotic osseous metastases without focal hypermetabolism.  IMPRESSION: Diffuse sclerotic osseous metastases without focal hypermetabolism.  Otherwise, no evidence of metastatic disease.  Prior hypermetabolism in the bilateral adnexal regions has resolved, likely postsurgical.   Original Report Authenticated By: Charline Bills, M.D.   Mm Digital Diagnostic Bilat  04/25/2013   *RADIOLOGY REPORT*  Clinical Data:  . The patient underwent right lumpectomy, chemotherapy and radiation therapy for breast cancer in 2000 08/2010.  She had a surgical excisional biopsy of the left breast demonstrating atypical ductal hyperplasia.  The patient recently underwent hysterectomy with metastatic breast carcinoma found in the  uterus, fallopian tubes, and ovaries.  She has skin nodules on the right breast and abdominal wall.  She states that she has noted increasing nipple inversion on the right.  DIGITAL DIAGNOSTIC BILATERAL MAMMOGRAM WITH CAD AND BILATERAL BREAST ULTRASOUND:  Comparison:  08/26/2012 on the right; 06/02/2012, 06/02/2011, 05/31/2010 bilaterally.  Findings:  ACR Breast Density Category c:  The breasts are heterogeneously dense, which may obscure small masses.  Right lumpectomy changes are present.  There may be increasing nipple inversion on the right.  It is difficult to delineate a definite mass within the postoperative change in the right subareolar region mammographically.  There is a spiculated density in the left upper outer quadrant. This is more pronounced than expected for postoperative change. There is a prominent lymph node in the left axilla.  Mammographic images were processed with CAD.  On physical exam, I palpate a mass at 2 o'clock 3 cm from the left nipple.  I palpate a superficial nodule in the skin at 6 o'clock 3 cm from the right nipple and at 5 o'clock in the right inframammary fold.  Ultrasound is performed, showing intradermal nodules in the right breast.  One is at 6 o'clock 3 cm from the right nipple measuring 4 x 4 x 3 mm.  The second is at 5 o'clock in the inframammary fold measuring 3 x 2 x 5 mm.  Postoperative changes  are noted in  the subareolar region.  This area will likely be better assessed on breast MRI to be performed later today.  There is an irregular mass on the left at 2 o'clock 3 cm from the left nipple measuring 1.3 x 1.1 x 1.0 cm.  There is a second mass at 2 o'clock 1 cm from the left nipple measuring 0.6 x 0.7 x 8 0.6 cm.  This is approximately 1 cm from the larger mass.  There is a lymph node with a thickened cortex in the left axilla.  The findings on the left are suspicious for invasive mammary carcinoma; ultrasound-guided core needle biopsy is suggested.  The intradermal  nodules may represent metastatic breast carcinoma.  IMPRESSION:  1.  Suspicious intradermal nodules on the right. 2.  Two suspicious masses at 2 o'clock in the left breast with abnormal left axillary lymph node.  RECOMMENDATION:  1.  One may wish to consider surgical excision of one of the skin nodules for further evaluation.  2.  Ultrasound-guided core needle biopsy of the two breast masses and the abnormal left axillary lymph node is suggested.  This could not be performed this morning due to schedule constraints and can be scheduled after the patient's MRI is interpreted.  3.  Correlate with scheduled breast MRI later today to evaluate the right subareolar region.  I have discussed the findings and recommendations with the patient. Results were also provided in writing at the conclusion of the visit.  If applicable, a reminder letter will be sent to the patient regarding the next appointment.  BI-RADS CATEGORY 5:  Highly suggestive of malignancy - appropriate action should be taken.   Original Report Authenticated By: Cain Saupe, M.D.    Mm Digital Diagnostic Unilat L  04/29/2013   *RADIOLOGY REPORT*  Clinical Data:  Ultrasound-guided core needle biopsy of two masses in the left breast  with clip placement.  DIGITAL DIAGNOSTIC LEFT MAMMOGRAM  Comparison:  Previous exams.  Findings:  Films are performed following ultrasound guided biopsy of two masses at 2 o'clock in the left breast.  The ribbon clip and the TriMark clip are appropriately located in the left upper outer quadrant.  IMPRESSION: Appropriate clip placement following ultrasound-guided core needle biopsy of two masses at 2 o'clock in the left breast.  The clips are approximately 2 cm apart.   Original Report Authenticated By: Cain Saupe, M.D.     ASSESSMENT: A 47 y.o. [place] woman with stage IV breast cancer  (1) status post bilateral breast biopsies in August 2010.  On the left, only atypical ductal hyperplasia.  On the right  upper outer quadrant, high-grade invasive ductal carcinoma, clincally T2 N0, stage IIA  (2)  Treated in the neoadjuvant setting with docetaxel, doxorubicin, and cyclophosphamide x6, chemotherapy completed in December 2010.    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  Three of 10 lymph nodes were positive.  ypTis ypN0. Tumor was strongly estrogen receptor/progesterone receptor positive, HER2/neu negative with high proliferation fraction.   (4)  Received radiation therapy, completed May 2011,   (5)  on tamoxifen May 2011 to January 2014 when she was found to have stage IV disease  (6) s/p TAH-BSO 11/25/2012 with metastatic brast cancer, estrogen receptor 30% and progestrerone receptor 20% positive, HER-2 negative  (7) anastrozole started February 2014, discontinued in April 2014 due to poor tolerance  (  8)  patient started letrozole in mid May 2014, discontinued August 2014 with progression  (9)  zoledronic acid given every 28 days for bony metastatic disease, first dose in May 2014  (10) skin involvement over the left breast and possibly other distant skin sites noted June 2014, with   (a) biopsy of the left breast and a left axillary node 04/29/2013  confirming 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  (11) status post bilateral mastectomies 05/25/2013, showing:  (a) on the left, mypT1c NX invasive mammary carcinoma, with ductal and lobular features, grade 1, repeat HER-2 again  negative  (b) on the right, yp T2 NX invasive lobular breast cancer, grade 1, with negative margins, and HER-2 again negative  (12) right chest wall skin biopsy and right neck biopsy both positive for metastatic breast cancer  (13) fulvestrant at 500 mg monthly started 06/10/2013  PLAN: We spent the better part of today's 45 minute meeting going over her pathology and the overall situation. She understands that stage IV breast cancer is not curable, but it is treatable. The first question is to decide whether to go to chemotherapy at this point or continue on antiestrogen. Given the fact that both the cancers removed in her July surgery were grade 1, one would think she will do better with anti-estrogens then with chemotherapy, although she has progressed through letrozole and before that tamoxifen.  Accordingly we are going to try fulvestrant. She has a good understanding of the possible toxicities, side effects and complications of this medication. She understands the goal of treatment is control. She is very much in agreement with this plan, and we are starting today.  She will receive fulvestrant today, then again on August 21 together with her next zolendronate dose.  She will then continue every 4 weeks. I am going to see her after her November dose and at that point she will have restaging scans.  She knows to call for any problems that may develop before her next visit here.Marland Kitchen  MAGRINAT,GUSTAV C    06/10/2013

## 2013-06-15 ENCOUNTER — Ambulatory Visit (INDEPENDENT_AMBULATORY_CARE_PROVIDER_SITE_OTHER): Payer: Medicaid Other | Admitting: General Surgery

## 2013-06-15 ENCOUNTER — Encounter (INDEPENDENT_AMBULATORY_CARE_PROVIDER_SITE_OTHER): Payer: Medicaid Other | Admitting: General Surgery

## 2013-06-15 ENCOUNTER — Encounter (INDEPENDENT_AMBULATORY_CARE_PROVIDER_SITE_OTHER): Payer: Self-pay | Admitting: General Surgery

## 2013-06-15 VITALS — BP 122/88 | HR 72 | Temp 97.0°F | Resp 14 | Ht 63.0 in | Wt 161.0 lb

## 2013-06-15 DIAGNOSIS — C50411 Malignant neoplasm of upper-outer quadrant of right female breast: Secondary | ICD-10-CM

## 2013-06-15 DIAGNOSIS — C50419 Malignant neoplasm of upper-outer quadrant of unspecified female breast: Secondary | ICD-10-CM

## 2013-06-15 NOTE — Progress Notes (Signed)
Subjective:     Patient ID: Tammy Blanchard, female   DOB: 05/01/1966, 47 y.o.   MRN: [id number]  HPI The patient is a 47 year old black female who is about 3 weeks status post bilateral mastectomies for metastatic breast cancer. Her main complaint is that she feels swelling along her chest wall. The swelling seems to be worse in the evening. She does continue to have some pain along her chest wall.  Review of Systems     Objective:   Physical Exam On exam both of her mastectomy incisions are healing nicely with no sign of infection. There is no significant seroma Under the skin flap.    Assessment:     The patient is 3 weeks status post bilateral mastectomies for metastatic breast cancer     Plan:     At this point she will continue adjuvant therapy with medical oncologist. [facility] plan to see her back in about 3 months to check her progress

## 2013-06-17 ENCOUNTER — Encounter (INDEPENDENT_AMBULATORY_CARE_PROVIDER_SITE_OTHER): Payer: Medicaid Other | Admitting: General Surgery

## 2013-06-21 ENCOUNTER — Other Ambulatory Visit (HOSPITAL_BASED_OUTPATIENT_CLINIC_OR_DEPARTMENT_OTHER): Payer: No Typology Code available for payment source | Admitting: Lab

## 2013-06-21 ENCOUNTER — Ambulatory Visit (HOSPITAL_BASED_OUTPATIENT_CLINIC_OR_DEPARTMENT_OTHER): Payer: No Typology Code available for payment source

## 2013-06-21 ENCOUNTER — Other Ambulatory Visit: Payer: Self-pay | Admitting: Emergency Medicine

## 2013-06-21 VITALS — BP 134/89 | HR 93 | Temp 98.6°F | Resp 18

## 2013-06-21 DIAGNOSIS — C7951 Secondary malignant neoplasm of bone: Secondary | ICD-10-CM

## 2013-06-21 DIAGNOSIS — C50419 Malignant neoplasm of upper-outer quadrant of unspecified female breast: Secondary | ICD-10-CM

## 2013-06-21 DIAGNOSIS — Z5111 Encounter for antineoplastic chemotherapy: Secondary | ICD-10-CM

## 2013-06-21 DIAGNOSIS — C50919 Malignant neoplasm of unspecified site of unspecified female breast: Secondary | ICD-10-CM

## 2013-06-21 LAB — COMPREHENSIVE METABOLIC PANEL (CC13)
ALT: 68 U/L — ABNORMAL HIGH (ref 0–55)
AST: 25 U/L (ref 5–34)
Albumin: 3.8 g/dL (ref 3.5–5.0)
Calcium: 10 mg/dL (ref 8.4–10.4)
Chloride: 108 mEq/L (ref 98–109)
Creatinine: 0.6 mg/dL (ref 0.6–1.1)
Potassium: 4 mEq/L (ref 3.5–5.1)
Sodium: 138 mEq/L (ref 136–145)
Total Protein: 7.4 g/dL (ref 6.4–8.3)

## 2013-06-21 LAB — CBC WITH DIFFERENTIAL/PLATELET
BASO%: 0.4 % (ref 0.0–2.0)
MCHC: 33 g/dL (ref 31.5–36.0)
MONO#: 0.6 10*3/uL (ref 0.1–0.9)
NEUT#: 2.2 10*3/uL (ref 1.5–6.5)
RBC: 4.39 10*6/uL (ref 3.70–5.45)
WBC: 5.1 10*3/uL (ref 3.9–10.3)
lymph#: 2 10*3/uL (ref 0.9–3.3)
nRBC: 0 % (ref 0–0)

## 2013-06-21 MED ORDER — ZOLEDRONIC ACID 4 MG/100ML IV SOLN
4.0000 mg | Freq: Once | INTRAVENOUS | Status: AC
  Administered 2013-06-21: 4 mg via INTRAVENOUS
  Filled 2013-06-21: qty 100

## 2013-06-21 MED ORDER — FULVESTRANT 250 MG/5ML IM SOLN
500.0000 mg | Freq: Once | INTRAMUSCULAR | Status: AC
  Administered 2013-06-21: 500 mg via INTRAMUSCULAR
  Filled 2013-06-21: qty 10

## 2013-06-21 MED ORDER — SODIUM CHLORIDE 0.9 % IV SOLN
Freq: Once | INTRAVENOUS | Status: AC
  Administered 2013-06-21: 10:00:00 via INTRAVENOUS

## 2013-06-21 NOTE — Patient Instructions (Addendum)
Fulvestrant injection What is this medicine? FULVESTRANT (ful VES trant) blocks the effects of estrogen. It is used to treat breast cancer in women past the age of menopause. This medicine may be used for other purposes; ask your health care provider or pharmacist if you have questions. What should I tell my health care provider before I take this medicine? They need to know if you have any of these conditions: -bleeding problems -liver disease -low levels of platelets in the blood -an unusual or allergic reaction to fulvestrant, other medicines, foods, dyes, or preservatives -pregnant or trying to get pregnant -breast-feeding How should I use this medicine? This medicine is for injection into a muscle. It is usually given by a health care professional in a hospital or clinic setting. Talk to your pediatrician regarding the use of this medicine in children. Special care may be needed. Overdosage: If you think you have taken too much of this medicine contact a poison control center or emergency room at once. NOTE: This medicine is only for you. Do not share this medicine with others. What if I miss a dose? It is important not to miss your dose. Call your doctor or health care professional if you are unable to keep an appointment. What may interact with this medicine? -medicines that treat or prevent blood clots like warfarin, enoxaparin, and dalteparin This list may not describe all possible interactions. Give your health care provider a list of all the medicines, herbs, non-prescription drugs, or dietary supplements you use. Also tell them if you smoke, drink alcohol, or use illegal drugs. Some items may interact with your medicine. What should I watch for while using this medicine? Your condition will be monitored carefully while you are receiving this medicine. You will need important blood work done while you are taking this medicine. Do not become pregnant while taking this medicine.  Women should inform their doctor if they wish to become pregnant or think they might be pregnant. There is a potential for serious side effects to an unborn child. Talk to your health care professional or pharmacist for more information. What side effects may I notice from receiving this medicine? Side effects that you should report to your doctor or health care professional as soon as possible: -allergic reactions like skin rash, itching or hives, swelling of the face, lips, or tongue -feeling faint or lightheaded, falls -fever or flu-like symptoms -sore throat -vaginal bleeding Side effects that usually do not require medical attention (report to your doctor or health care professional if they continue or are bothersome): -aches, pains -constipation or diarrhea -headache -hot flashes -nausea, vomiting -pain at site where injected -stomach pain This list may not describe all possible side effects. Call your doctor for medical advice about side effects. You may report side effects to FDA at 1-800-FDA-1088. Where should I keep my medicine? This drug is given in a hospital or clinic and will not be stored at home. NOTE: This sheet is a summary. It may not cover all possible information. If you have questions about this medicine, talk to your doctor, pharmacist, or health care provider.  2013, Elsevier/Gold Standard. (02/28/2008 3:39:24 PM) Zoledronic Acid injection (Hypercalcemia, Oncology) What is this medicine? ZOLEDRONIC ACID (ZOE le dron ik AS id) lowers the amount of calcium loss from bone. It is used to treat too much calcium in your blood from cancer. It is also used to prevent complications of cancer that has spread to the bone. This medicine may be used for   other purposes; ask your health care provider or pharmacist if you have questions. What should I tell my health care provider before I take this medicine? They need to know if you have any of these conditions: -aspirin-sensitive  asthma -dental disease -kidney disease -an unusual or allergic reaction to zoledronic acid, other medicines, foods, dyes, or preservatives -pregnant or trying to get pregnant -breast-feeding How should I use this medicine? This medicine is for infusion into a vein. It is given by a health care professional in a hospital or clinic setting. Talk to your pediatrician regarding the use of this medicine in children. Special care may be needed. Overdosage: If you think you have taken too much of this medicine contact a poison control center or emergency room at once. NOTE: This medicine is only for you. Do not share this medicine with others. What if I miss a dose? It is important not to miss your dose. Call your doctor or health care professional if you are unable to keep an appointment. What may interact with this medicine? -certain antibiotics given by injection -NSAIDs, medicines for pain and inflammation, like ibuprofen or naproxen -some diuretics like bumetanide, furosemide -teriparatide -thalidomide This list may not describe all possible interactions. Give your health care provider a list of all the medicines, herbs, non-prescription drugs, or dietary supplements you use. Also tell them if you smoke, drink alcohol, or use illegal drugs. Some items may interact with your medicine. What should I watch for while using this medicine? Visit your doctor or health care professional for regular checkups. It may be some time before you see the benefit from this medicine. Do not stop taking your medicine unless your doctor tells you to. Your doctor may order blood tests or other tests to see how you are doing. Women should inform their doctor if they wish to become pregnant or think they might be pregnant. There is a potential for serious side effects to an unborn child. Talk to your health care professional or pharmacist for more information. You should make sure that you get enough calcium and  vitamin D while you are taking this medicine. Discuss the foods you eat and the vitamins you take with your health care professional. Some people who take this medicine have severe bone, joint, and/or muscle pain. This medicine may also increase your risk for a broken thigh bone. Tell your doctor right away if you have pain in your upper leg or groin. Tell your doctor if you have any pain that does not go away or that gets worse. What side effects may I notice from receiving this medicine? Side effects that you should report to your doctor or health care professional as soon as possible: -allergic reactions like skin rash, itching or hives, swelling of the face, lips, or tongue -anxiety, confusion, or depression -breathing problems -changes in vision -feeling faint or lightheaded, falls -jaw burning, cramping, pain -muscle cramps, stiffness, or weakness -trouble passing urine or change in the amount of urine Side effects that usually do not require medical attention (report to your doctor or health care professional if they continue or are bothersome): -bone, joint, or muscle pain -fever -hair loss -irritation at site where injected -loss of appetite -nausea, vomiting -stomach upset -tired This list may not describe all possible side effects. Call your doctor for medical advice about side effects. You may report side effects to FDA at 1-800-FDA-1088. Where should I keep my medicine? This drug is given in a hospital or clinic   and will not be stored at home. NOTE: This sheet is a summary. It may not cover all possible information. If you have questions about this medicine, talk to your doctor, pharmacist, or health care provider.  2013, Elsevier/Gold Standard. (04/18/2011 9:06:58 AM)  

## 2013-06-23 ENCOUNTER — Encounter: Payer: Self-pay | Admitting: Genetic Counselor

## 2013-06-23 ENCOUNTER — Telehealth: Payer: Self-pay | Admitting: *Deleted

## 2013-06-23 ENCOUNTER — Ambulatory Visit (HOSPITAL_BASED_OUTPATIENT_CLINIC_OR_DEPARTMENT_OTHER): Payer: Medicaid Other | Admitting: Genetic Counselor

## 2013-06-23 ENCOUNTER — Other Ambulatory Visit: Payer: No Typology Code available for payment source | Admitting: Lab

## 2013-06-23 DIAGNOSIS — C7982 Secondary malignant neoplasm of genital organs: Secondary | ICD-10-CM

## 2013-06-23 DIAGNOSIS — C50919 Malignant neoplasm of unspecified site of unspecified female breast: Secondary | ICD-10-CM

## 2013-06-23 NOTE — Progress Notes (Signed)
Dr. Raymond Gurney Magrinat requested a consultation for genetic counseling and risk assessment for Tammy Blanchard, a 47 y.o. female, for discussion of her personal history of breast cancer.  She presents to clinic today to discuss the possibility of a genetic predisposition to cancer, and to further clarify her risks, as well as her family members' risks for cancer.   HISTORY OF PRESENT ILLNESS: In 10/2009, at the age of 85, Tammy Blanchard was diagnosed with invasive ductal carcinoma of the right breast. This was treated with lumpectomy.  The tumor was ER+/PR+/Her2-.  Tammy Blanchard was again diagnosed in 05/2013 and had a double mastectomy.  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    Past Medical History  Diagnosis Date  . Hypertension   . Allergy   . GERD (gastroesophageal reflux disease)   . Thyroid disease   . Anemia     resolved 2011  . Hyperlipidemia     controlled  . History of blood transfusion 2009    [facility] -  UNKNOWN NUMBER OF UNITS TRANSFUSED  . 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 Cesarean section    . Wisdom tooth extraction    . Tubal ligation    . Breast surgery  10/2009    right lymp nodes removed  . Left foot surgery    . Abdominal hysterectomy  11/25/2012    Procedure: HYSTERECTOMY ABDOMINAL;  Surgeon: [name], MD;  Location: [facility] OR[facility];  Service: Gynecology;  Laterality: N/A;  with Bilateral Salpingoopherectomy and Cystoscopy  . Mastectomy complete / simple Bilateral 05/25/2013  . Mass biopsy  05/25/2013    on abdomen and right chest wall, and Right neck [name] 05/25/2013  . Total mastectomy Bilateral 05/25/2013    Dr Carolynne Edouard   . Total mastectomy Bilateral 05/25/2013    Procedure: Bilateral Total Mastectomy;  Surgeon: Robyne Askew, MD;  Location: [facility] OR;  Service: General;  Laterality: Bilateral;  . Mass biopsy N/A 05/25/2013    Procedure: Biopsy  nodule on abdomen and right  chest wall, and Right neck;  Surgeon: Robyne Askew, MD;  Location: [facility] OR;  Service: General;  Laterality: N/A;    History   Social History  . Marital Status: Widowed    Spouse Name: N/A    Number of Children: N/A  . Years of Education: N/A   Social History Main Topics  . Smoking status: Current Some Day Smoker -- 0.50 packs/day for 23 years    Types: Cigarettes    Last Attempt to Quit: 11/25/2012  . Smokeless tobacco: Never Used  . Alcohol Use: Yes     Comment: social  . Drug Use: No  . Sexual Activity: Yes    Birth Control/ Protection: Surgical   Other Topics Concern  . None   Social History Narrative  . None    REPRODUCTIVE HISTORY AND PERSONAL RISK ASSESSMENT FACTORS: Menarche was at age 89.   postmenopausal Uterus Intact: no Ovaries Intact: no G4P4A0, first live birth at age 4  She has not previously undergone treatment for infertility.   Oral Contraceptive use: 1 years   She has not used HRT in the past.    FAMILY HISTORY:  We obtained a detailed, 4-generation family history.  Significant diagnoses are listed below: Family History  Problem Relation Age of Onset  . Diabetes Mother   . Hypertension Mother   .  Diabetes Maternal Aunt   . Heart disease Maternal Aunt   . Hypertension Maternal Aunt   . Stroke Maternal Aunt   . Diabetes Maternal Grandmother   . Heart disease Maternal Grandmother   . Hypertension Maternal Grandmother   . Stroke Maternal Grandmother   . Diabetes Maternal Aunt   . Hypertension Maternal Aunt     Patient's maternal ancestors are of African [place] descent, and paternal ancestors are of unknown descent. There is no reported Ashkenazi Jewish ancestry. There is no known consanguinity.  GENETIC COUNSELING ASSESSMENT: Tammy Blanchard is a 47 y.o. 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 We, therefore, discussed and  recommended the following at today's visit.   DISCUSSION: We reviewed the characteristics, features and inheritance patterns of hereditary cancer syndromes. We also discussed genetic testing, including the appropriate family members to test, the process of testing, insurance coverage and turn-around-time for results.  In order to estimate her chance of having a BRCA mutation, we used statistical models (Penn II) and laboratory data that take into account her personal medical history, family history and ancestry.  Because each model is different, there can be a lot of variability in the risks they give.  Therefore, these numbers must be considered a rough range and not a precise risk of having a BRCA mutation.  These models estimate that she has approximately a 17% chance of having a mutation. Based on this assessment of her family and personal history, genetic testing is recommended.  PLAN: After considering the risks, benefits, and limitations, Tammy Blanchard provided informed consent to pursue genetic testing and the blood sample wil be sent to [facility] for analysis of the BRCA1 and 2 genes. We discussed the implications of a positive, negative and/ or variant of uncertain significance genetic test result. Results should be available within approximately 3-4 weeks' time, at which point they will be disclosed by telephone to Tammy Blanchard, as will any additional recommendations warranted by these results. Tammy Blanchard will receive a summary of her genetic counseling visit and a copy of her results once available. This information will also be available in Epic. We encouraged Tammy Blanchard to remain in contact with cancer genetics annually so that we can continuously update the family history and inform her of any changes in cancer genetics and testing that may be of benefit for her family. Tammy Blanchard's questions were answered to her satisfaction today. Our contact information was provided  should additional questions or concerns arise.  The patient was seen for a total of 60 minutes, greater than 50% of which was spent face-to-face counseling.  This plan is being carried out per Dr. Feliz Beam recommendations.  This note will also be sent to the referring provider via the electronic medical record. The patient will be supplied with a summary of this genetic counseling discussion as well as educational information on the discussed hereditary cancer syndromes following the conclusion of their visit.   Patient was discussed with Dr. Drue Second.   _______________________________________________________________________ For Office Staff:  Number of people involved in session: 2 Was an Intern/ student involved with case: no

## 2013-06-23 NOTE — Telephone Encounter (Signed)
Per staff message and POF I have scheduled appts.  JMW  

## 2013-06-28 ENCOUNTER — Telehealth (INDEPENDENT_AMBULATORY_CARE_PROVIDER_SITE_OTHER): Payer: Self-pay

## 2013-06-28 DIAGNOSIS — C50919 Malignant neoplasm of unspecified site of unspecified female breast: Secondary | ICD-10-CM

## 2013-06-28 MED ORDER — UNABLE TO FIND: Status: DC

## 2013-06-28 NOTE — Telephone Encounter (Signed)
Message copied by Brennan Bailey on Tue Jun 28, 2013  8:47 AM ------      Message from: Marin Shutter      Created: Mon Jun 27, 2013 10:56 AM      Regarding: Dr. Alvester Morin: [phone]       Pt walked in, and needs a Rx for [facility].  Please fax today.             Name, DOB, mastectomy supplies, (needed on Rx)            Fax: [phone] for [facility]            Pt would like a confirmation call once faxed.  Thx ------

## 2013-06-28 NOTE — Telephone Encounter (Signed)
Called pt and let her know I faxed prosthesis order to [facility].

## 2013-07-05 ENCOUNTER — Ambulatory Visit (HOSPITAL_BASED_OUTPATIENT_CLINIC_OR_DEPARTMENT_OTHER): Payer: Medicaid Other

## 2013-07-05 VITALS — BP 125/88 | HR 88 | Temp 98.5°F

## 2013-07-05 DIAGNOSIS — C50419 Malignant neoplasm of upper-outer quadrant of unspecified female breast: Secondary | ICD-10-CM

## 2013-07-05 DIAGNOSIS — C50919 Malignant neoplasm of unspecified site of unspecified female breast: Secondary | ICD-10-CM

## 2013-07-05 DIAGNOSIS — C7951 Secondary malignant neoplasm of bone: Secondary | ICD-10-CM

## 2013-07-05 DIAGNOSIS — Z5111 Encounter for antineoplastic chemotherapy: Secondary | ICD-10-CM

## 2013-07-05 MED ORDER — FULVESTRANT 250 MG/5ML IM SOLN
500.0000 mg | Freq: Once | INTRAMUSCULAR | Status: AC
  Administered 2013-07-05: 500 mg via INTRAMUSCULAR
  Filled 2013-07-05: qty 10

## 2013-07-08 ENCOUNTER — Encounter: Payer: Self-pay | Admitting: Obstetrics & Gynecology

## 2013-07-08 ENCOUNTER — Ambulatory Visit (INDEPENDENT_AMBULATORY_CARE_PROVIDER_SITE_OTHER): Payer: No Typology Code available for payment source | Admitting: Obstetrics & Gynecology

## 2013-07-08 VITALS — BP 132/93 | HR 90 | Ht 63.0 in | Wt 159.1 lb

## 2013-07-08 DIAGNOSIS — Z8542 Personal history of malignant neoplasm of other parts of uterus: Secondary | ICD-10-CM

## 2013-07-08 DIAGNOSIS — N949 Unspecified condition associated with female genital organs and menstrual cycle: Secondary | ICD-10-CM

## 2013-07-08 DIAGNOSIS — N898 Other specified noninflammatory disorders of vagina: Secondary | ICD-10-CM

## 2013-07-08 NOTE — Patient Instructions (Signed)
Bacterial Vaginosis Bacterial vaginosis (BV) is a vaginal infection where the normal balance of bacteria in the vagina is disrupted. The normal balance is then replaced by an overgrowth of certain bacteria. There are several different kinds of bacteria that can cause BV. BV is the most common vaginal infection in women of childbearing age. CAUSES   The cause of BV is not fully understood. BV develops when there is an increase or imbalance of harmful bacteria.  Some activities or behaviors can upset the normal balance of bacteria in the vagina and put women at increased risk including:  Having a new sex partner or multiple sex partners.  Douching.  Using an intrauterine device (IUD) for contraception.  It is not clear what role sexual activity plays in the development of BV. However, women that have never had sexual intercourse are rarely infected with BV. Women do not get BV from toilet seats, bedding, swimming pools or from touching objects around them.  SYMPTOMS   Grey vaginal discharge.  A fish-like odor with discharge, especially after sexual intercourse.  Itching or burning of the vagina and vulva.  Burning or pain with urination.  Some women have no signs or symptoms at all. DIAGNOSIS  Your caregiver must examine the vagina for signs of BV. Your caregiver will perform lab tests and look at the sample of vaginal fluid through a microscope. They will look for bacteria and abnormal cells (clue cells), a pH test higher than 4.5, and a positive amine test all associated with BV.  RISKS AND COMPLICATIONS   Pelvic inflammatory disease (PID).  Infections following gynecology surgery.  Developing HIV.  Developing herpes virus. TREATMENT  Sometimes BV will clear up without treatment. However, all women with symptoms of BV should be treated to avoid complications, especially if gynecology surgery is planned. Female partners generally do not need to be treated. However, BV may spread  between female sex partners so treatment is helpful in preventing a recurrence of BV.   BV may be treated with antibiotics. The antibiotics come in either pill or vaginal cream forms. Either can be used with nonpregnant or pregnant women, but the recommended dosages differ. These antibiotics are not harmful to the baby.  BV can recur after treatment. If this happens, a second round of antibiotics will often be prescribed.  Treatment is important for pregnant women. If not treated, BV can cause a premature delivery, especially for a pregnant woman who had a premature birth in the past. All pregnant women who have symptoms of BV should be checked and treated.  For chronic reoccurrence of BV, treatment with a type of prescribed gel vaginally twice a week is helpful. HOME CARE INSTRUCTIONS   Finish all medication as directed by your caregiver.  Do not have sex until treatment is completed.  Tell your sexual partner that you have a vaginal infection. They should see their caregiver and be treated if they have problems, such as a mild rash or itching.  Practice safe sex. Use condoms. Only have 1 sex partner. PREVENTION  Basic prevention steps can help reduce the risk of upsetting the natural balance of bacteria in the vagina and developing BV:  Do not have sexual intercourse (be abstinent).  Do not douche.  Use all of the medicine prescribed for treatment of BV, even if the signs and symptoms go away.  Tell your sex partner if you have BV. That way, they can be treated, if needed, to prevent reoccurrence. SEEK MEDICAL CARE IF:     Your symptoms are not improving after 3 days of treatment.  You have increased discharge, pain, or fever. MAKE SURE YOU:   Understand these instructions.  Will watch your condition.  Will get help right away if you are not doing well or get worse. FOR MORE INFORMATION  Division of STD Prevention (DSTDP), Centers for Disease Control and Prevention:  www.cdc.gov/std American Social Health Association (ASHA): www.ashastd.org  Document Released: 10/20/2005 Document Revised: 01/12/2012 Document Reviewed: 04/12/2009 ExitCare Patient Information 2014 [employer].  

## 2013-07-08 NOTE — Progress Notes (Signed)
Subjective:     Patient ID: Tammy Blanchard, female   DOB: [birth year], 47 y.o.   MRN: [id number]  HPI Pt well known to me from prior surgery here for a f/u.  Since she was last seen by me she has had a bilateral mastectomy.  She reports being on 2 meds for metastatic disease but, does not have the names of the meds with her.  She reports that she is feeling well and has started exercising with walking.  She denies pelvic pain.  She reports that she had an odor 1 week ago but, she has used an OTC feminine spray and her sx have resolved.   Review of Systems     Objective:   Physical Exam BP 132/93  Pulse 90  Ht 5\' 3"  (1.6 m)  Wt 159 lb 1.6 oz (72.167 kg)  BMI 28.19 kg/m2  LMP 11/12/2012  Pt in NAD GU: EGBUS: no lesions Vagina: no blood in vault; No abnormal discharge noted; cuff well healed Cervix/uterus- surgically absent  Adnexa: no masses palpable         Assessment:     Vaginal odor- need to r/o BV H/o metastatic ovary, cervix and fallopian tube cancer- no masses palpated at cuff        Plan:     F/u wet smear F/u in 6months for pelvic exam or sooner prn

## 2013-07-09 LAB — WET PREP, GENITAL: WBC, Wet Prep HPF POC: NONE SEEN

## 2013-07-10 ENCOUNTER — Other Ambulatory Visit: Payer: Self-pay | Admitting: Obstetrics & Gynecology

## 2013-07-10 DIAGNOSIS — B9689 Other specified bacterial agents as the cause of diseases classified elsewhere: Secondary | ICD-10-CM

## 2013-07-10 MED ORDER — CLINDAMYCIN PHOSPHATE 1 % EX GEL: Freq: Two times a day (BID) | CUTANEOUS | Status: DC

## 2013-07-11 ENCOUNTER — Telehealth: Payer: Self-pay | Admitting: General Practice

## 2013-07-11 ENCOUNTER — Encounter: Payer: Self-pay | Admitting: Genetic Counselor

## 2013-07-11 NOTE — Telephone Encounter (Signed)
Called patient and informed her of results and medication available for pickup. Patient verbalized understanding and had no further questions  

## 2013-07-11 NOTE — Telephone Encounter (Signed)
Message copied by Kathee Delton on Mon Jul 11, 2013  8:58 AM ------      Message from: [name]      Created: Sun Jul 10, 2013  8:05 AM       Ignore last msg.  Pt allergic to Flagyl.  Rx for Clindamycin gel at pharmacy. ------

## 2013-07-12 ENCOUNTER — Ambulatory Visit: Payer: Medicaid Other | Attending: General Surgery | Admitting: Physical Therapy

## 2013-07-12 ENCOUNTER — Telehealth: Payer: Self-pay | Admitting: *Deleted

## 2013-07-12 DIAGNOSIS — IMO0001 Reserved for inherently not codable concepts without codable children: Secondary | ICD-10-CM | POA: Insufficient documentation

## 2013-07-12 DIAGNOSIS — C50919 Malignant neoplasm of unspecified site of unspecified female breast: Secondary | ICD-10-CM | POA: Insufficient documentation

## 2013-07-12 DIAGNOSIS — Z9010 Acquired absence of unspecified breast and nipple: Secondary | ICD-10-CM | POA: Insufficient documentation

## 2013-07-12 DIAGNOSIS — M24519 Contracture, unspecified shoulder: Secondary | ICD-10-CM | POA: Insufficient documentation

## 2013-07-12 DIAGNOSIS — I890 Lymphedema, not elsewhere classified: Secondary | ICD-10-CM | POA: Insufficient documentation

## 2013-07-12 NOTE — Telephone Encounter (Addendum)
Pt left message of wanting to know if an alternate Rx has been sent in to her pharmacy.  I called pt and informed her that alternate Rx for clindamycin has been sent in.  Pt voiced understanding.

## 2013-07-13 NOTE — Progress Notes (Signed)
Pt pharmacy called [facility]) and informed me that the Rx that was prescribed clindamycin 1% gel is for the face and not vaginal so the Pharmacist asked if it could changed to the 2% cream clindamycin.  I agreed to the change and informed him that I would informed the provider.

## 2013-07-19 ENCOUNTER — Ambulatory Visit (HOSPITAL_BASED_OUTPATIENT_CLINIC_OR_DEPARTMENT_OTHER): Payer: Medicaid Other | Admitting: Physician Assistant

## 2013-07-19 ENCOUNTER — Ambulatory Visit: Payer: Medicaid Other | Admitting: Physician Assistant

## 2013-07-19 ENCOUNTER — Encounter: Payer: Self-pay | Admitting: Physician Assistant

## 2013-07-19 ENCOUNTER — Telehealth: Payer: Self-pay | Admitting: *Deleted

## 2013-07-19 ENCOUNTER — Other Ambulatory Visit: Payer: No Typology Code available for payment source | Admitting: Lab

## 2013-07-19 ENCOUNTER — Ambulatory Visit: Payer: No Typology Code available for payment source

## 2013-07-19 VITALS — BP 135/94 | HR 83 | Temp 98.6°F | Resp 18 | Ht 63.0 in | Wt 160.0 lb

## 2013-07-19 DIAGNOSIS — C50919 Malignant neoplasm of unspecified site of unspecified female breast: Secondary | ICD-10-CM

## 2013-07-19 DIAGNOSIS — C50411 Malignant neoplasm of upper-outer quadrant of right female breast: Secondary | ICD-10-CM

## 2013-07-19 DIAGNOSIS — C50419 Malignant neoplasm of upper-outer quadrant of unspecified female breast: Secondary | ICD-10-CM

## 2013-07-19 DIAGNOSIS — C7951 Secondary malignant neoplasm of bone: Secondary | ICD-10-CM

## 2013-07-19 DIAGNOSIS — N951 Menopausal and female climacteric states: Secondary | ICD-10-CM

## 2013-07-19 DIAGNOSIS — L989 Disorder of the skin and subcutaneous tissue, unspecified: Secondary | ICD-10-CM

## 2013-07-19 NOTE — Telephone Encounter (Signed)
Per staff message and POF I have scheduled appts.  JMW  
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
## 2013-07-19 NOTE — Telephone Encounter (Signed)
appts made and printed. Pt is aware that tx will be added. i emailed MW to add the tx...td 

## 2013-07-20 ENCOUNTER — Telehealth: Payer: Self-pay | Admitting: Oncology

## 2013-07-20 NOTE — Telephone Encounter (Signed)
, °

## 2013-07-22 ENCOUNTER — Ambulatory Visit: Payer: Medicaid Other | Attending: Internal Medicine | Admitting: Internal Medicine

## 2013-07-22 VITALS — BP 144/91 | HR 78 | Temp 98.8°F | Resp 16 | Ht 63.0 in | Wt 160.0 lb

## 2013-07-22 DIAGNOSIS — C7951 Secondary malignant neoplasm of bone: Secondary | ICD-10-CM

## 2013-07-22 DIAGNOSIS — C50411 Malignant neoplasm of upper-outer quadrant of right female breast: Secondary | ICD-10-CM

## 2013-07-22 DIAGNOSIS — Z853 Personal history of malignant neoplasm of breast: Secondary | ICD-10-CM | POA: Insufficient documentation

## 2013-07-22 DIAGNOSIS — C50419 Malignant neoplasm of upper-outer quadrant of unspecified female breast: Secondary | ICD-10-CM

## 2013-07-22 DIAGNOSIS — Z23 Encounter for immunization: Secondary | ICD-10-CM

## 2013-07-22 DIAGNOSIS — Z9010 Acquired absence of unspecified breast and nipple: Secondary | ICD-10-CM | POA: Insufficient documentation

## 2013-07-22 DIAGNOSIS — L989 Disorder of the skin and subcutaneous tissue, unspecified: Secondary | ICD-10-CM | POA: Insufficient documentation

## 2013-07-22 DIAGNOSIS — C50919 Malignant neoplasm of unspecified site of unspecified female breast: Secondary | ICD-10-CM | POA: Insufficient documentation

## 2013-07-22 MED ORDER — UNABLE TO FIND: Status: DC

## 2013-07-22 NOTE — Progress Notes (Signed)
PT HERE FOR DERMATOLOGY REFERRAL BLACK SPOT R POSTERIOR F/A NEED SCRIPT MASTECTOMY SUPPLIES

## 2013-07-22 NOTE — Progress Notes (Signed)
Patient ID: Tammy Blanchard, female   DOB: July 25, 1966, 47 y.o.   MRN: [id number] Patient Demographics  Tammy Blanchard, is a 47 y.o. female  WUJ:[id number]  [id number]  DOB - 1965/11/13  Chief Complaint  Patient presents with  . Follow-up  . Referral    DERMATOLOGY        Subjective:   Tammy Blanchard today is here to establish primary care.  Patient is a 47 year old female, recently found to have right breast cancer, stage IV, metastatic to bone, following Dr. Darnelle Catalan, underwent double mastectomy in July 2014 by Dr. Carolynne Edouard. She's currently on Zometa. She states that she has a right arm skin lesion, she has prior history of skin cancer. Wants dermatology referral.   Objective:    Filed Vitals:   07/22/13 1632  BP: 144/91  Pulse: 78  Temp: 98.8 F (37.1 C)  TempSrc: Oral  Resp: 16  Height: 5\' 3"  (1.6 m)  Weight: 160 lb (72.576 kg)  SpO2: 95%     ALLERGIES:   Allergies  Allergen Reactions  . Metronidazole Swelling  . Tramadol Nausea Only    PAST MEDICAL HISTORY: Past Medical History  Diagnosis Date  . Hypertension   . Allergy   . GERD (gastroesophageal reflux disease)   . Thyroid disease   . Anemia     resolved 2011  . Hyperlipidemia     controlled  . History of blood transfusion 2009    [facility] -  UNKNOWN NUMBER OF UNITS TRANSFUSED  . Breast cancer 10/2009, 2014    ER+/PR+/Her2-    PAST SURGICAL HISTORY: Past Surgical History  Procedure Laterality Date  . Cesarean section    . Wisdom tooth extraction    . Tubal ligation    . Breast surgery  10/2009    right lymp nodes removed  . Left foot surgery    . Abdominal hysterectomy  11/25/2012    Procedure: HYSTERECTOMY ABDOMINAL;  Surgeon: [name], MD;  Location: [facility];  Service: Gynecology;  Laterality: N/A;  with Bilateral Salpingoopherectomy and Cystoscopy  . Mastectomy complete / simple Bilateral 05/25/2013  . Mass biopsy  05/25/2013    on abdomen and right chest wall, and Right neck [name]  05/25/2013  . Total mastectomy Bilateral 05/25/2013    Dr Carolynne Edouard   . Total mastectomy Bilateral 05/25/2013    Procedure: Bilateral Total Mastectomy;  Surgeon: Robyne Askew, MD;  Location: [facility] OR;  Service: General;  Laterality: Bilateral;  . Mass biopsy N/A 05/25/2013    Procedure: Biopsy  nodule on abdomen and right chest wall, and Right neck;  Surgeon: Robyne Askew, MD;  Location: [facility] OR;  Service: General;  Laterality: N/A;    FAMILY HISTORY: Family History  Problem Relation Age of Onset  . Diabetes Mother   . Hypertension Mother   . Diabetes Maternal Aunt   . Heart disease Maternal Aunt   . Hypertension Maternal Aunt   . Stroke Maternal Aunt   . Diabetes Maternal Grandmother   . Heart disease Maternal Grandmother   . Hypertension Maternal Grandmother   . Stroke Maternal Grandmother   . Diabetes Maternal Aunt   . Hypertension Maternal Aunt     MEDICATIONS AT HOME: Prior to Admission medications   Medication Sig Start Date End Date Taking? Authorizing Provider  amLODipine (NORVASC) 10 MG tablet Take 1 tablet (10 mg total) by mouth daily. 05/20/13  Yes Clanford Cyndie Mull, MD  cloNIDine (CATAPRES) 0.2 MG tablet Take 1 tablet (0.2 mg  total) by mouth at bedtime. 05/20/13  Yes Clanford Cyndie Mull, MD  docusate sodium (COLACE) 100 MG capsule Take 1 capsule (100 mg total) by mouth 2 (two) times daily as needed for constipation. 11/28/12  Yes [name], MD  Fulvestrant (FASLODEX IM) Inject 500 mg into the muscle every 28 (twenty-eight) days. 06/10/13  Yes Lowella Dell, MD  LORazepam (ATIVAN) 0.5 MG tablet Take 1 tablet (0.5 mg total) by mouth every 8 (eight) hours as needed for anxiety (take before every zometa treatment). 05/11/13  Yes Lowella Dell, MD  losartan (COZAAR) 100 MG tablet Take 1 tablet (100 mg total) by mouth daily. 04/18/13  Yes Leroy Sea, MD  UNABLE TO FIND SIg: RX: mastectomy supplies-L8015 post mastectomy camisole (quantity 2 no refill) and L8000  post surgical bras (quantity  No refill) DX- 174.9 bilateral mastectomy 06/28/13  Yes Caleen Essex III, MD  Zoledronic Acid (ZOMETA IV) Inject 4 mg into the vein. 03/17/13  Yes Lowella Dell, MD  calcium carbonate (TUMS - DOSED IN MG ELEMENTAL CALCIUM) 500 MG chewable tablet Chew 1 tablet by mouth daily. 03/29/13   Lowella Dell, MD  clindamycin (CLINDAGEL) 1 % gel Apply topically 2 (two) times daily. 07/10/13   [name], MD  UNABLE TO FIND Mastectomy Supplies 07/22/13   Ebin Palazzi Jenna Luo, MD    REVIEW OF SYSTEMS:  Constitutional:   No   Fevers, chills, fatigue.  HEENT:    No headaches, Sore throat,   Cardio-vascular: No chest pain,  Orthopnea, swelling in lower extremities, anasarca, palpitations  GI:  No abdominal pain, nausea, vomiting, diarrhea  Resp: No shortness of breath,  No coughing up of blood.No cough.No wheezing.  Skin:  Small 0.5 cm lesion on the right arm, irregular borders. Also nodule in the mid sternal area.  GU:  no dysuria, change in color of urine, no urgency or frequency.  No flank pain.  Musculoskeletal: No joint pain or swelling.  No decreased range of motion.  No back pain.  Psych: No change in mood or affect. No depression or anxiety.  No memory loss.   Exam  General appearance :Awake, alert, NAD, Speech Clear. HEENT: Atraumatic and Normocephalic, PERLA Neck: supple, no JVD. No cervical lymphadenopathy.  Chest: clear to auscultation bilaterally, no wheezing, rales or rhonchi CVS: S1 S2 regular, no murmurs.  Abdomen: soft, NBS, NT, ND, no gaurding, rigidity or rebound. Extremities: No cyanosis, clubbing, B/L Lower Ext shows no edema,  Neurology: Awake alert, and oriented X 3, CN II-XII intact, Non focal Skin: Small 0.5 cm lesion on the right arm, irregular borders. Also nodule in the mid sternal area Wounds: N/A    Data Review   Basic Metabolic Panel: No results found for this basename: NA, K, CL, CO2, GLUCOSE, BUN, CREATININE,  CALCIUM, MG, PHOS,  in the last 168 hours Liver Function Tests: No results found for this basename: AST, ALT, ALKPHOS, BILITOT, PROT, ALBUMIN,  in the last 168 hours  CBC: No results found for this basename: WBC, NEUTROABS, HGB, HCT, MCV, PLT,  in the last 168 hours ------------------------------------------------------------------------------------------------------------------ No results found for this basename: HGBA1C,  in the last 72 hours ------------------------------------------------------------------------------------------------------------------ No results found for this basename: CHOL, HDL, LDLCALC, TRIG, CHOLHDL, LDLDIRECT,  in the last 72 hours ------------------------------------------------------------------------------------------------------------------ No results found for this basename: TSH, T4TOTAL, FREET3, T3FREE, THYROIDAB,  in the last 72 hours ------------------------------------------------------------------------------------------------------------------ No results found for this basename: VITAMINB12, FOLATE, FERRITIN, TIBC, IRON, RETICCTPCT,  in the last  72 hours  Coagulation profile  No results found for this basename: INR, PROTIME,  in the last 168 hours    Assessment & Plan   Active Problems: History of recently diagnosed stage IV breast cancer, status post double mastectomy  - Prescription for mastectomy supplies given to the patient   Mole/skin lesion on the right arm - Urgent excisional biopsy needed, ambulatory referral to dermatology and Gen. surgery sent to rule out cancer    health screening - Patient has had screening up-to-date, had OB-GYN appt last week - flu shot today  Follow-up in 3 months as needed   Dhairya Corales M.D. 07/22/2013, 4:47 PM

## 2013-07-25 ENCOUNTER — Other Ambulatory Visit: Payer: Self-pay | Admitting: Hematology and Oncology

## 2013-07-25 DIAGNOSIS — C50919 Malignant neoplasm of unspecified site of unspecified female breast: Secondary | ICD-10-CM

## 2013-07-26 ENCOUNTER — Other Ambulatory Visit (HOSPITAL_BASED_OUTPATIENT_CLINIC_OR_DEPARTMENT_OTHER): Payer: Medicaid Other | Admitting: Lab

## 2013-07-26 ENCOUNTER — Encounter: Payer: Self-pay | Admitting: Physician Assistant

## 2013-07-26 ENCOUNTER — Telehealth: Payer: Self-pay | Admitting: *Deleted

## 2013-07-26 ENCOUNTER — Ambulatory Visit (HOSPITAL_BASED_OUTPATIENT_CLINIC_OR_DEPARTMENT_OTHER): Payer: Medicaid Other | Admitting: Physician Assistant

## 2013-07-26 VITALS — BP 125/88 | HR 102 | Temp 98.5°F | Resp 18 | Ht 63.0 in | Wt 157.2 lb

## 2013-07-26 DIAGNOSIS — C50919 Malignant neoplasm of unspecified site of unspecified female breast: Secondary | ICD-10-CM

## 2013-07-26 DIAGNOSIS — C50411 Malignant neoplasm of upper-outer quadrant of right female breast: Secondary | ICD-10-CM

## 2013-07-26 DIAGNOSIS — C50419 Malignant neoplasm of upper-outer quadrant of unspecified female breast: Secondary | ICD-10-CM

## 2013-07-26 DIAGNOSIS — C50911 Malignant neoplasm of unspecified site of right female breast: Secondary | ICD-10-CM

## 2013-07-26 DIAGNOSIS — C7951 Secondary malignant neoplasm of bone: Secondary | ICD-10-CM

## 2013-07-26 DIAGNOSIS — L989 Disorder of the skin and subcutaneous tissue, unspecified: Secondary | ICD-10-CM

## 2013-07-26 LAB — COMPREHENSIVE METABOLIC PANEL (CC13)
ALT: 49 U/L (ref 0–55)
Albumin: 4.4 g/dL (ref 3.5–5.0)
CO2: 22 mEq/L (ref 22–29)
Glucose: 86 mg/dl (ref 70–140)
Potassium: 3.4 mEq/L — ABNORMAL LOW (ref 3.5–5.1)
Sodium: 141 mEq/L (ref 136–145)
Total Bilirubin: 0.75 mg/dL (ref 0.20–1.20)
Total Protein: 8.4 g/dL — ABNORMAL HIGH (ref 6.4–8.3)

## 2013-07-26 LAB — CBC WITH DIFFERENTIAL/PLATELET
BASO%: 1 % (ref 0.0–2.0)
Eosinophils Absolute: 0.4 10*3/uL (ref 0.0–0.5)
LYMPH%: 35.9 % (ref 14.0–49.7)
MCHC: 33.5 g/dL (ref 31.5–36.0)
MONO#: 0.5 10*3/uL (ref 0.1–0.9)
NEUT#: 2.6 10*3/uL (ref 1.5–6.5)
Platelets: 308 10*3/uL (ref 145–400)
RBC: 4.91 10*6/uL (ref 3.70–5.45)
RDW: 15.5 % — ABNORMAL HIGH (ref 11.2–14.5)
WBC: 5.4 10*3/uL (ref 3.9–10.3)
lymph#: 1.9 10*3/uL (ref 0.9–3.3)

## 2013-07-26 NOTE — Telephone Encounter (Signed)
appts made and printed...td 

## 2013-07-26 NOTE — Telephone Encounter (Signed)
Per staff message I have adjusted 11/25 appt
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
## 2013-07-28 ENCOUNTER — Encounter (INDEPENDENT_AMBULATORY_CARE_PROVIDER_SITE_OTHER): Payer: Self-pay | Admitting: General Surgery

## 2013-07-28 ENCOUNTER — Ambulatory Visit (INDEPENDENT_AMBULATORY_CARE_PROVIDER_SITE_OTHER): Payer: Medicaid Other | Admitting: General Surgery

## 2013-07-28 VITALS — BP 122/70 | HR 70 | Temp 97.4°F | Resp 14 | Ht 63.0 in | Wt 159.8 lb

## 2013-07-28 DIAGNOSIS — C50911 Malignant neoplasm of unspecified site of right female breast: Secondary | ICD-10-CM

## 2013-07-28 DIAGNOSIS — C50919 Malignant neoplasm of unspecified site of unspecified female breast: Secondary | ICD-10-CM

## 2013-07-28 NOTE — Patient Instructions (Addendum)
Will plan to remove nodules as directed by Dr. Darnelle Catalan

## 2013-08-02 ENCOUNTER — Other Ambulatory Visit (HOSPITAL_BASED_OUTPATIENT_CLINIC_OR_DEPARTMENT_OTHER): Payer: Medicaid Other | Admitting: Lab

## 2013-08-02 ENCOUNTER — Ambulatory Visit (HOSPITAL_BASED_OUTPATIENT_CLINIC_OR_DEPARTMENT_OTHER): Payer: Medicaid Other

## 2013-08-02 ENCOUNTER — Ambulatory Visit: Payer: Medicaid Other

## 2013-08-02 VITALS — BP 126/85 | HR 93 | Temp 98.3°F | Resp 16

## 2013-08-02 DIAGNOSIS — C50419 Malignant neoplasm of upper-outer quadrant of unspecified female breast: Secondary | ICD-10-CM

## 2013-08-02 DIAGNOSIS — C7951 Secondary malignant neoplasm of bone: Secondary | ICD-10-CM

## 2013-08-02 DIAGNOSIS — C50919 Malignant neoplasm of unspecified site of unspecified female breast: Secondary | ICD-10-CM

## 2013-08-02 DIAGNOSIS — Z5111 Encounter for antineoplastic chemotherapy: Secondary | ICD-10-CM

## 2013-08-02 LAB — CBC WITH DIFFERENTIAL/PLATELET
BASO%: 0.4 % (ref 0.0–2.0)
LYMPH%: 41.8 % (ref 14.0–49.7)
MCHC: 33.1 g/dL (ref 31.5–36.0)
MCV: 85.8 fL (ref 79.5–101.0)
MONO#: 0.4 10*3/uL (ref 0.1–0.9)
MONO%: 9.2 % (ref 0.0–14.0)
NEUT#: 2 10*3/uL (ref 1.5–6.5)
Platelets: 267 10*3/uL (ref 145–400)
RBC: 4.51 10*6/uL (ref 3.70–5.45)
RDW: 14.4 % (ref 11.2–14.5)
WBC: 4.7 10*3/uL (ref 3.9–10.3)
nRBC: 0 % (ref 0–0)

## 2013-08-02 LAB — COMPREHENSIVE METABOLIC PANEL (CC13)
Albumin: 4 g/dL (ref 3.5–5.0)
Alkaline Phosphatase: 140 U/L (ref 40–150)
BUN: 7.6 mg/dL (ref 7.0–26.0)
CO2: 22 mEq/L (ref 22–29)
Creatinine: 0.6 mg/dL (ref 0.6–1.1)
Glucose: 96 mg/dl (ref 70–140)
Potassium: 3.8 mEq/L (ref 3.5–5.1)
Sodium: 139 mEq/L (ref 136–145)
Total Protein: 7.4 g/dL (ref 6.4–8.3)

## 2013-08-02 MED ORDER — FULVESTRANT 250 MG/5ML IM SOLN
500.0000 mg | Freq: Once | INTRAMUSCULAR | Status: AC
  Administered 2013-08-02: 500 mg via INTRAMUSCULAR
  Filled 2013-08-02: qty 10

## 2013-08-02 MED ORDER — SODIUM CHLORIDE 0.9 % IV SOLN
Freq: Once | INTRAVENOUS | Status: AC
  Administered 2013-08-02: 10:00:00 via INTRAVENOUS

## 2013-08-02 MED ORDER — ZOLEDRONIC ACID 4 MG/5ML IV CONC
4.0000 mg | Freq: Once | INTRAVENOUS | Status: AC
  Administered 2013-08-02: 4 mg via INTRAVENOUS
  Filled 2013-08-02: qty 5

## 2013-08-02 NOTE — Patient Instructions (Addendum)
Fulvestrant injection What is this medicine? FULVESTRANT (ful VES trant) blocks the effects of estrogen. It is used to treat breast cancer in women past the age of menopause. This medicine may be used for other purposes; ask your health care provider or pharmacist if you have questions. What should I tell my health care provider before I take this medicine? They need to know if you have any of these conditions: -bleeding problems -liver disease -low levels of platelets in the blood -an unusual or allergic reaction to fulvestrant, other medicines, foods, dyes, or preservatives -pregnant or trying to get pregnant -breast-feeding How should I use this medicine? This medicine is for injection into a muscle. It is usually given by a health care professional in a hospital or clinic setting. Talk to your pediatrician regarding the use of this medicine in children. Special care may be needed. Overdosage: If you think you have taken too much of this medicine contact a poison control center or emergency room at once. NOTE: This medicine is only for you. Do not share this medicine with others. What if I miss a dose? It is important not to miss your dose. Call your doctor or health care professional if you are unable to keep an appointment. What may interact with this medicine? -medicines that treat or prevent blood clots like warfarin, enoxaparin, and dalteparin This list may not describe all possible interactions. Give your health care provider a list of all the medicines, herbs, non-prescription drugs, or dietary supplements you use. Also tell them if you smoke, drink alcohol, or use illegal drugs. Some items may interact with your medicine. What should I watch for while using this medicine? Your condition will be monitored carefully while you are receiving this medicine. You will need important blood work done while you are taking this medicine. Do not become pregnant while taking this medicine.  Women should inform their doctor if they wish to become pregnant or think they might be pregnant. There is a potential for serious side effects to an unborn child. Talk to your health care professional or pharmacist for more information. What side effects may I notice from receiving this medicine? Side effects that you should report to your doctor or health care professional as soon as possible: -allergic reactions like skin rash, itching or hives, swelling of the face, lips, or tongue -feeling faint or lightheaded, falls -fever or flu-like symptoms -sore throat -vaginal bleeding Side effects that usually do not require medical attention (report to your doctor or health care professional if they continue or are bothersome): -aches, pains -constipation or diarrhea -headache -hot flashes -nausea, vomiting -pain at site where injected -stomach pain This list may not describe all possible side effects. Call your doctor for medical advice about side effects. You may report side effects to FDA at 1-800-FDA-1088. Where should I keep my medicine? This drug is given in a hospital or clinic and will not be stored at home. NOTE: This sheet is a summary. It may not cover all possible information. If you have questions about this medicine, talk to your doctor, pharmacist, or health care provider.  2013, Elsevier/Gold Standard. (02/28/2008 3:39:24 PM) Zoledronic Acid injection (Hypercalcemia, Oncology) What is this medicine? ZOLEDRONIC ACID (ZOE le dron ik AS id) lowers the amount of calcium loss from bone. It is used to treat too much calcium in your blood from cancer. It is also used to prevent complications of cancer that has spread to the bone. This medicine may be used for   other purposes; ask your health care provider or pharmacist if you have questions. What should I tell my health care provider before I take this medicine? They need to know if you have any of these conditions: -aspirin-sensitive  asthma -dental disease -kidney disease -an unusual or allergic reaction to zoledronic acid, other medicines, foods, dyes, or preservatives -pregnant or trying to get pregnant -breast-feeding How should I use this medicine? This medicine is for infusion into a vein. It is given by a health care professional in a hospital or clinic setting. Talk to your pediatrician regarding the use of this medicine in children. Special care may be needed. Overdosage: If you think you have taken too much of this medicine contact a poison control center or emergency room at once. NOTE: This medicine is only for you. Do not share this medicine with others. What if I miss a dose? It is important not to miss your dose. Call your doctor or health care professional if you are unable to keep an appointment. What may interact with this medicine? -certain antibiotics given by injection -NSAIDs, medicines for pain and inflammation, like ibuprofen or naproxen -some diuretics like bumetanide, furosemide -teriparatide -thalidomide This list may not describe all possible interactions. Give your health care provider a list of all the medicines, herbs, non-prescription drugs, or dietary supplements you use. Also tell them if you smoke, drink alcohol, or use illegal drugs. Some items may interact with your medicine. What should I watch for while using this medicine? Visit your doctor or health care professional for regular checkups. It may be some time before you see the benefit from this medicine. Do not stop taking your medicine unless your doctor tells you to. Your doctor may order blood tests or other tests to see how you are doing. Women should inform their doctor if they wish to become pregnant or think they might be pregnant. There is a potential for serious side effects to an unborn child. Talk to your health care professional or pharmacist for more information. You should make sure that you get enough calcium and  vitamin D while you are taking this medicine. Discuss the foods you eat and the vitamins you take with your health care professional. Some people who take this medicine have severe bone, joint, and/or muscle pain. This medicine may also increase your risk for a broken thigh bone. Tell your doctor right away if you have pain in your upper leg or groin. Tell your doctor if you have any pain that does not go away or that gets worse. What side effects may I notice from receiving this medicine? Side effects that you should report to your doctor or health care professional as soon as possible: -allergic reactions like skin rash, itching or hives, swelling of the face, lips, or tongue -anxiety, confusion, or depression -breathing problems -changes in vision -feeling faint or lightheaded, falls -jaw burning, cramping, pain -muscle cramps, stiffness, or weakness -trouble passing urine or change in the amount of urine Side effects that usually do not require medical attention (report to your doctor or health care professional if they continue or are bothersome): -bone, joint, or muscle pain -fever -hair loss -irritation at site where injected -loss of appetite -nausea, vomiting -stomach upset -tired This list may not describe all possible side effects. Call your doctor for medical advice about side effects. You may report side effects to FDA at 1-800-FDA-1088. Where should I keep my medicine? This drug is given in a hospital or clinic   and will not be stored at home. NOTE: This sheet is a summary. It may not cover all possible information. If you have questions about this medicine, talk to your doctor, pharmacist, or health care provider.  2013, Elsevier/Gold Standard. (04/18/2011 9:06:58 AM)  

## 2013-08-23 ENCOUNTER — Encounter: Payer: Self-pay | Admitting: Dietician

## 2013-08-23 NOTE — Progress Notes (Signed)
Pt registered, but as a was a no-show for Breast Cancer Nutrition Class appointment scheduled for 08/23/2013 at 1800. 

## 2013-08-25 NOTE — Progress Notes (Signed)
Subjective:     Patient ID: Tammy Blanchard, female   DOB: Jul 26, 1966, 47 y.o.   MRN: [id number]  HPI The patient is a 47 year old white female who is 2 months status post bilateral mastectomies for metastatic breast cancer. She continues to do well during the healing process from her surgery. She had nodules along her chest wall that were removed at the time of the surgery that were positive for breast cancer. Not all of the nodules could be removed at the time of the mastectomy. She continues to have some nodules on her chest wall and one on her right forearm. Her oncologist has asked for these nodules to be removed if possible.  Review of Systems  Constitutional: Negative.   HENT: Negative.   Eyes: Negative.   Respiratory: Negative.   Cardiovascular: Negative.   Gastrointestinal: Negative.   Endocrine: Negative.   Genitourinary: Negative.   Musculoskeletal: Negative.   Skin: Negative.   Allergic/Immunologic: Negative.   Neurological: Negative.   Hematological: Negative.   Psychiatric/Behavioral: Negative.        Objective:   Physical Exam  Constitutional: She is oriented to person, place, and time. She appears well-developed and well-nourished.  HENT:  Head: Normocephalic and atraumatic.  Eyes: Conjunctivae and EOM are normal. Pupils are equal, round, and reactive to light.  Neck: Normal range of motion. Neck supple.  Cardiovascular: Normal rate, regular rhythm and normal heart sounds.   Pulmonary/Chest: Effort normal and breath sounds normal.  Her mastectomy incisions are healing well with no sign of infection. Her skin flaps are healthy. She continues to have a couple of small nodules along her chest wall  Abdominal: Soft. Bowel sounds are normal.  Musculoskeletal: Normal range of motion.  There is a mobile nodule on her right forearm  Neurological: She is alert and oriented to person, place, and time.  Skin: Skin is warm and dry.  Psychiatric: She has a normal mood and  affect. Her behavior is normal.       Assessment:     The patient is 2 months status post bilateral mastectomies for metastatic breast cancer     Plan:     At this point the oncologist would like to have more nodules from the chest wall and right form removed. I've discussed with her in detail the risks and benefits of the operation to do this as well as some of the technical aspects and she understands and wishes to proceed. We will plan to remove these nodules for in the near future as directed by the oncologist.

## 2013-08-30 ENCOUNTER — Ambulatory Visit (HOSPITAL_BASED_OUTPATIENT_CLINIC_OR_DEPARTMENT_OTHER): Payer: Medicaid Other

## 2013-08-30 ENCOUNTER — Encounter: Payer: Self-pay | Admitting: Physician Assistant

## 2013-08-30 ENCOUNTER — Ambulatory Visit (HOSPITAL_BASED_OUTPATIENT_CLINIC_OR_DEPARTMENT_OTHER): Payer: Medicaid Other | Admitting: Physician Assistant

## 2013-08-30 ENCOUNTER — Other Ambulatory Visit (HOSPITAL_BASED_OUTPATIENT_CLINIC_OR_DEPARTMENT_OTHER): Payer: Medicaid Other | Admitting: Lab

## 2013-08-30 ENCOUNTER — Ambulatory Visit: Payer: Medicaid Other

## 2013-08-30 VITALS — BP 129/85 | HR 87 | Temp 98.6°F | Resp 18 | Ht 63.0 in | Wt 157.6 lb

## 2013-08-30 DIAGNOSIS — G4700 Insomnia, unspecified: Secondary | ICD-10-CM

## 2013-08-30 DIAGNOSIS — C50419 Malignant neoplasm of upper-outer quadrant of unspecified female breast: Secondary | ICD-10-CM

## 2013-08-30 DIAGNOSIS — C7951 Secondary malignant neoplasm of bone: Secondary | ICD-10-CM

## 2013-08-30 DIAGNOSIS — C50919 Malignant neoplasm of unspecified site of unspecified female breast: Secondary | ICD-10-CM

## 2013-08-30 DIAGNOSIS — L989 Disorder of the skin and subcutaneous tissue, unspecified: Secondary | ICD-10-CM

## 2013-08-30 DIAGNOSIS — Z5111 Encounter for antineoplastic chemotherapy: Secondary | ICD-10-CM

## 2013-08-30 DIAGNOSIS — F419 Anxiety disorder, unspecified: Secondary | ICD-10-CM | POA: Insufficient documentation

## 2013-08-30 DIAGNOSIS — K5900 Constipation, unspecified: Secondary | ICD-10-CM

## 2013-08-30 DIAGNOSIS — R222 Localized swelling, mass and lump, trunk: Secondary | ICD-10-CM

## 2013-08-30 DIAGNOSIS — F411 Generalized anxiety disorder: Secondary | ICD-10-CM

## 2013-08-30 DIAGNOSIS — C50411 Malignant neoplasm of upper-outer quadrant of right female breast: Secondary | ICD-10-CM

## 2013-08-30 DIAGNOSIS — M899 Disorder of bone, unspecified: Secondary | ICD-10-CM

## 2013-08-30 DIAGNOSIS — R599 Enlarged lymph nodes, unspecified: Secondary | ICD-10-CM

## 2013-08-30 DIAGNOSIS — C50911 Malignant neoplasm of unspecified site of right female breast: Secondary | ICD-10-CM

## 2013-08-30 LAB — COMPREHENSIVE METABOLIC PANEL (CC13)
ALT: 41 U/L (ref 0–55)
AST: 27 U/L (ref 5–34)
Alkaline Phosphatase: 154 U/L — ABNORMAL HIGH (ref 40–150)
Sodium: 140 mEq/L (ref 136–145)
Total Bilirubin: 0.58 mg/dL (ref 0.20–1.20)
Total Protein: 7.9 g/dL (ref 6.4–8.3)

## 2013-08-30 LAB — CBC WITH DIFFERENTIAL/PLATELET
BASO%: 0.2 % (ref 0.0–2.0)
EOS%: 6.9 % (ref 0.0–7.0)
Eosinophils Absolute: 0.4 10*3/uL (ref 0.0–0.5)
HCT: 39 % (ref 34.8–46.6)
MCV: 85.7 fL (ref 79.5–101.0)
MONO%: 8.3 % (ref 0.0–14.0)
NEUT#: 2.5 10*3/uL (ref 1.5–6.5)
NEUT%: 44 % (ref 38.4–76.8)
RBC: 4.55 10*6/uL (ref 3.70–5.45)
RDW: 14.5 % (ref 11.2–14.5)
nRBC: 0 % (ref 0–0)

## 2013-08-30 MED ORDER — FULVESTRANT 250 MG/5ML IM SOLN
500.0000 mg | Freq: Once | INTRAMUSCULAR | Status: AC
  Administered 2013-08-30: 500 mg via INTRAMUSCULAR
  Filled 2013-08-30: qty 10

## 2013-08-30 MED ORDER — SODIUM CHLORIDE 0.9 % IV SOLN
Freq: Once | INTRAVENOUS | Status: AC
  Administered 2013-08-30: 14:00:00 via INTRAVENOUS

## 2013-08-30 MED ORDER — HYDROCODONE-ACETAMINOPHEN 5-325 MG PO TABS: 1.0000 | ORAL_TABLET | Freq: Three times a day (TID) | ORAL | Status: DC | PRN

## 2013-08-30 MED ORDER — ZOLEDRONIC ACID 4 MG/100ML IV SOLN
4.0000 mg | Freq: Once | INTRAVENOUS | Status: AC
  Administered 2013-08-30: 4 mg via INTRAVENOUS
  Filled 2013-08-30: qty 100

## 2013-08-30 MED ORDER — LORAZEPAM 0.5 MG PO TABS: 0.5000 mg | ORAL_TABLET | Freq: Three times a day (TID) | ORAL | Status: DC | PRN

## 2013-08-30 MED ORDER — FULVESTRANT 250 MG/5ML IM SOLN
500.0000 mg | Freq: Once | INTRAMUSCULAR | Status: DC
  Filled 2013-08-30: qty 10
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
## 2013-08-30 NOTE — Patient Instructions (Addendum)
Zoledronic Acid injection (Hypercalcemia, Oncology) What is this medicine? ZOLEDRONIC ACID (ZOE le dron ik AS id) lowers the amount of calcium loss from bone. It is used to treat too much calcium in your blood from cancer. It is also used to prevent complications of cancer that has spread to the bone. This medicine may be used for other purposes; ask your health care provider or pharmacist if you have questions. What should I tell my health care provider before I take this medicine? They need to know if you have any of these conditions: -aspirin-sensitive asthma -dental disease -kidney disease -an unusual or allergic reaction to zoledronic acid, other medicines, foods, dyes, or preservatives -pregnant or trying to get pregnant -breast-feeding How should I use this medicine? This medicine is for infusion into a vein. It is given by a health care professional in a hospital or clinic setting. Talk to your pediatrician regarding the use of this medicine in children. Special care may be needed. Overdosage: If you think you have taken too much of this medicine contact a poison control center or emergency room at once. NOTE: This medicine is only for you. Do not share this medicine with others. What if I miss a dose? It is important not to miss your dose. Call your doctor or health care professional if you are unable to keep an appointment. What may interact with this medicine? -certain antibiotics given by injection -NSAIDs, medicines for pain and inflammation, like ibuprofen or naproxen -some diuretics like bumetanide, furosemide -teriparatide -thalidomide This list may not describe all possible interactions. Give your health care provider a list of all the medicines, herbs, non-prescription drugs, or dietary supplements you use. Also tell them if you smoke, drink alcohol, or use illegal drugs. Some items may interact with your medicine. What should I watch for while using this medicine? Visit  your doctor or health care professional for regular checkups. It may be some time before you see the benefit from this medicine. Do not stop taking your medicine unless your doctor tells you to. Your doctor may order blood tests or other tests to see how you are doing. Women should inform their doctor if they wish to become pregnant or think they might be pregnant. There is a potential for serious side effects to an unborn child. Talk to your health care professional or pharmacist for more information. You should make sure that you get enough calcium and vitamin D while you are taking this medicine. Discuss the foods you eat and the vitamins you take with your health care professional. Some people who take this medicine have severe bone, joint, and/or muscle pain. This medicine may also increase your risk for a broken thigh bone. Tell your doctor right away if you have pain in your upper leg or groin. Tell your doctor if you have any pain that does not go away or that gets worse. What side effects may I notice from receiving this medicine? Side effects that you should report to your doctor or health care professional as soon as possible: -allergic reactions like skin rash, itching or hives, swelling of the face, lips, or tongue -anxiety, confusion, or depression -breathing problems -changes in vision -feeling faint or lightheaded, falls -jaw burning, cramping, pain -muscle cramps, stiffness, or weakness -trouble passing urine or change in the amount of urine Side effects that usually do not require medical attention (report to your doctor or health care professional if they continue or are bothersome): -bone, joint, or muscle pain -  fever -hair loss -irritation at site where injected -loss of appetite -nausea, vomiting -stomach upset -tired This list may not describe all possible side effects. Call your doctor for medical advice about side effects. You may report side effects to FDA at  1-800-FDA-1088. Where should I keep my medicine? This drug is given in a hospital or clinic and will not be stored at home. NOTE: This sheet is a summary. It may not cover all possible information. If you have questions about this medicine, talk to your doctor, pharmacist, or health care provider.  2012, [employer]/Gold Standard. (04/18/2011 9:06:58 AM) 

## 2013-08-31 ENCOUNTER — Other Ambulatory Visit (INDEPENDENT_AMBULATORY_CARE_PROVIDER_SITE_OTHER): Payer: Self-pay | Admitting: General Surgery

## 2013-09-02 ENCOUNTER — Telehealth: Payer: Self-pay | Admitting: Oncology

## 2013-09-02 NOTE — Telephone Encounter (Signed)
LVMM adv pt of PET for 09/08/13 shh

## 2013-09-08 ENCOUNTER — Encounter (HOSPITAL_COMMUNITY): Payer: Medicaid Other

## 2013-09-09 ENCOUNTER — Other Ambulatory Visit: Payer: Self-pay | Admitting: *Deleted

## 2013-09-12 ENCOUNTER — Encounter (HOSPITAL_BASED_OUTPATIENT_CLINIC_OR_DEPARTMENT_OTHER): Payer: Self-pay | Admitting: *Deleted

## 2013-09-12 NOTE — Progress Notes (Signed)
Had labs 08/30/13-ekg 1/14

## 2013-09-13 ENCOUNTER — Telehealth: Payer: Self-pay | Admitting: *Deleted

## 2013-09-13 NOTE — Telephone Encounter (Signed)
This RN received call from Dr Billey Chang office stating per communication regarding change in PET scan to 11/17- Dr Carolynne Edouard will r/s bx to post scan. Dr Billey Chang office will call pt with new bx schedule.  This RN called pt's work number per demographic page and was informed pt no longer is employed there. Message left on identified VM for cell number per above and to return call to this RN if questions.

## 2013-09-14 ENCOUNTER — Encounter (INDEPENDENT_AMBULATORY_CARE_PROVIDER_SITE_OTHER): Payer: Medicaid Other | Admitting: General Surgery

## 2013-09-15 ENCOUNTER — Ambulatory Visit (HOSPITAL_BASED_OUTPATIENT_CLINIC_OR_DEPARTMENT_OTHER): Admission: RE | Admit: 2013-09-15 | Payer: Medicaid Other | Source: Ambulatory Visit | Admitting: General Surgery

## 2013-09-15 ENCOUNTER — Other Ambulatory Visit (HOSPITAL_COMMUNITY): Payer: Medicaid Other

## 2013-09-15 SURGERY — EXCISION MASS
Anesthesia: General | Laterality: Right

## 2013-09-19 ENCOUNTER — Encounter (HOSPITAL_COMMUNITY)
Admission: RE | Admit: 2013-09-19 | Discharge: 2013-09-19 | Disposition: A | Discharge status: Home / Self Care | Payer: Medicaid Other | Source: Ambulatory Visit | Attending: Physician Assistant | Admitting: Physician Assistant

## 2013-09-19 DIAGNOSIS — K7689 Other specified diseases of liver: Secondary | ICD-10-CM | POA: Insufficient documentation

## 2013-09-19 DIAGNOSIS — C7951 Secondary malignant neoplasm of bone: Secondary | ICD-10-CM | POA: Insufficient documentation

## 2013-09-19 DIAGNOSIS — K8020 Calculus of gallbladder without cholecystitis without obstruction: Secondary | ICD-10-CM | POA: Insufficient documentation

## 2013-09-19 DIAGNOSIS — C50911 Malignant neoplasm of unspecified site of right female breast: Secondary | ICD-10-CM

## 2013-09-19 DIAGNOSIS — M948X9 Other specified disorders of cartilage, unspecified sites: Secondary | ICD-10-CM | POA: Insufficient documentation

## 2013-09-19 DIAGNOSIS — R188 Other ascites: Secondary | ICD-10-CM | POA: Insufficient documentation

## 2013-09-19 DIAGNOSIS — C50919 Malignant neoplasm of unspecified site of unspecified female breast: Secondary | ICD-10-CM

## 2013-09-19 DIAGNOSIS — C50411 Malignant neoplasm of upper-outer quadrant of right female breast: Secondary | ICD-10-CM

## 2013-09-19 DIAGNOSIS — R599 Enlarged lymph nodes, unspecified: Secondary | ICD-10-CM | POA: Insufficient documentation

## 2013-09-19 DIAGNOSIS — Z9079 Acquired absence of other genital organ(s): Secondary | ICD-10-CM | POA: Insufficient documentation

## 2013-09-19 DIAGNOSIS — Z90710 Acquired absence of both cervix and uterus: Secondary | ICD-10-CM | POA: Insufficient documentation

## 2013-09-19 DIAGNOSIS — Z9010 Acquired absence of unspecified breast and nipple: Secondary | ICD-10-CM | POA: Insufficient documentation

## 2013-09-19 LAB — GLUCOSE, CAPILLARY: Glucose-Capillary: 102 mg/dL — ABNORMAL HIGH (ref 70–99)

## 2013-09-19 MED ORDER — FLUDEOXYGLUCOSE F - 18 (FDG) INJECTION
20.3000 | Freq: Once | INTRAVENOUS | Status: AC | PRN
  Administered 2013-09-19: 20.3 via INTRAVENOUS

## 2013-09-26 ENCOUNTER — Other Ambulatory Visit: Payer: Self-pay | Admitting: Family Medicine

## 2013-09-26 ENCOUNTER — Encounter: Payer: Self-pay | Admitting: *Deleted

## 2013-09-26 ENCOUNTER — Encounter: Payer: Self-pay | Admitting: Oncology

## 2013-09-26 NOTE — Progress Notes (Signed)
RECEIVED A FAX FROM [facility] CONCERNING A PRIOR AUTHORIZATION FOR  LOSARTAN POTASSIUM. THIS REQUEST WAS PLACED IN THE MANAGED CARE BIN.

## 2013-09-26 NOTE — Progress Notes (Signed)
Faxed pa form to [facility] @ [phone] for losartan potassium.

## 2013-09-27 ENCOUNTER — Ambulatory Visit (HOSPITAL_BASED_OUTPATIENT_CLINIC_OR_DEPARTMENT_OTHER): Payer: Medicaid Other

## 2013-09-27 ENCOUNTER — Other Ambulatory Visit: Payer: Medicaid Other | Admitting: Lab

## 2013-09-27 ENCOUNTER — Other Ambulatory Visit (HOSPITAL_BASED_OUTPATIENT_CLINIC_OR_DEPARTMENT_OTHER): Payer: Medicaid Other | Admitting: Lab

## 2013-09-27 ENCOUNTER — Ambulatory Visit: Payer: Medicaid Other

## 2013-09-27 ENCOUNTER — Ambulatory Visit (HOSPITAL_BASED_OUTPATIENT_CLINIC_OR_DEPARTMENT_OTHER): Payer: Medicaid Other | Admitting: Oncology

## 2013-09-27 VITALS — BP 115/80 | HR 90 | Temp 98.5°F | Resp 19 | Ht 63.0 in | Wt 158.6 lb

## 2013-09-27 DIAGNOSIS — C50919 Malignant neoplasm of unspecified site of unspecified female breast: Secondary | ICD-10-CM

## 2013-09-27 DIAGNOSIS — Z5111 Encounter for antineoplastic chemotherapy: Secondary | ICD-10-CM

## 2013-09-27 DIAGNOSIS — C792 Secondary malignant neoplasm of skin: Secondary | ICD-10-CM

## 2013-09-27 DIAGNOSIS — C50411 Malignant neoplasm of upper-outer quadrant of right female breast: Secondary | ICD-10-CM

## 2013-09-27 DIAGNOSIS — L989 Disorder of the skin and subcutaneous tissue, unspecified: Secondary | ICD-10-CM

## 2013-09-27 DIAGNOSIS — C7951 Secondary malignant neoplasm of bone: Secondary | ICD-10-CM

## 2013-09-27 DIAGNOSIS — C50419 Malignant neoplasm of upper-outer quadrant of unspecified female breast: Secondary | ICD-10-CM

## 2013-09-27 DIAGNOSIS — F329 Major depressive disorder, single episode, unspecified: Secondary | ICD-10-CM

## 2013-09-27 DIAGNOSIS — E049 Nontoxic goiter, unspecified: Secondary | ICD-10-CM

## 2013-09-27 LAB — COMPREHENSIVE METABOLIC PANEL (CC13)
ALT: 31 U/L (ref 0–55)
AST: 22 U/L (ref 5–34)
Anion Gap: 11 mEq/L (ref 3–11)
CO2: 21 mEq/L — ABNORMAL LOW (ref 22–29)
Calcium: 9.9 mg/dL (ref 8.4–10.4)
Chloride: 107 mEq/L (ref 98–109)
Creatinine: 0.6 mg/dL (ref 0.6–1.1)
Glucose: 110 mg/dl (ref 70–140)
Potassium: 3.5 mEq/L (ref 3.5–5.1)
Sodium: 140 mEq/L (ref 136–145)
Total Protein: 7.6 g/dL (ref 6.4–8.3)

## 2013-09-27 LAB — CBC WITH DIFFERENTIAL/PLATELET
BASO%: 0.4 % (ref 0.0–2.0)
Eosinophils Absolute: 0.5 10*3/uL (ref 0.0–0.5)
HCT: 38.5 % (ref 34.8–46.6)
HGB: 12.7 g/dL (ref 11.6–15.9)
LYMPH%: 45.3 % (ref 14.0–49.7)
MCHC: 33 g/dL (ref 31.5–36.0)
MONO#: 0.4 10*3/uL (ref 0.1–0.9)
NEUT#: 2 10*3/uL (ref 1.5–6.5)
NEUT%: 37.8 % — ABNORMAL LOW (ref 38.4–76.8)
Platelets: 268 10*3/uL (ref 145–400)
RDW: 14.3 % (ref 11.2–14.5)
WBC: 5.2 10*3/uL (ref 3.9–10.3)
lymph#: 2.4 10*3/uL (ref 0.9–3.3)

## 2013-09-27 MED ORDER — ZOLEDRONIC ACID 4 MG/5ML IV CONC
4.0000 mg | Freq: Once | INTRAVENOUS | Status: AC
  Administered 2013-09-27: 4 mg via INTRAVENOUS
  Filled 2013-09-27: qty 5

## 2013-09-27 MED ORDER — FULVESTRANT 250 MG/5ML IM SOLN
500.0000 mg | Freq: Once | INTRAMUSCULAR | Status: AC
  Administered 2013-09-27: 500 mg via INTRAMUSCULAR
  Filled 2013-09-27: qty 10

## 2013-09-27 NOTE — Patient Instructions (Signed)
Zoledronic Acid injection (Hypercalcemia, Oncology) What is this medicine? ZOLEDRONIC ACID (ZOE le dron ik AS id) lowers the amount of calcium loss from bone. It is used to treat too much calcium in your blood from cancer. It is also used to prevent complications of cancer that has spread to the bone. This medicine may be used for other purposes; ask your health care provider or pharmacist if you have questions. COMMON BRAND NAME(S): Zometa What should I tell my health care provider before I take this medicine? They need to know if you have any of these conditions: -aspirin-sensitive asthma -cancer, especially if you are receiving medicines used to treat cancer -dental disease or wear dentures -infection -kidney disease -receiving corticosteroids like dexamethasone or prednisone -an unusual or allergic reaction to zoledronic acid, other medicines, foods, dyes, or preservatives -pregnant or trying to get pregnant -breast-feeding How should I use this medicine? This medicine is for infusion into a vein. It is given by a health care professional in a hospital or clinic setting. Talk to your pediatrician regarding the use of this medicine in children. Special care may be needed. Overdosage: If you think you have taken too much of this medicine contact a poison control center or emergency room at once. NOTE: This medicine is only for you. Do not share this medicine with others. What if I miss a dose? It is important not to miss your dose. Call your doctor or health care professional if you are unable to keep an appointment. What may interact with this medicine? -certain antibiotics given by injection -NSAIDs, medicines for pain and inflammation, like ibuprofen or naproxen -some diuretics like bumetanide, furosemide -teriparatide -thalidomide This list may not describe all possible interactions. Give your health care provider a list of all the medicines, herbs, non-prescription drugs, or  dietary supplements you use. Also tell them if you smoke, drink alcohol, or use illegal drugs. Some items may interact with your medicine. What should I watch for while using this medicine? Visit your doctor or health care professional for regular checkups. It may be some time before you see the benefit from this medicine. Do not stop taking your medicine unless your doctor tells you to. Your doctor may order blood tests or other tests to see how you are doing. Women should inform their doctor if they wish to become pregnant or think they might be pregnant. There is a potential for serious side effects to an unborn child. Talk to your health care professional or pharmacist for more information. You should make sure that you get enough calcium and vitamin D while you are taking this medicine. Discuss the foods you eat and the vitamins you take with your health care professional. Some people who take this medicine have severe bone, joint, and/or muscle pain. This medicine may also increase your risk for jaw problems or a broken thigh bone. Tell your doctor right away if you have severe pain in your jaw, bones, joints, or muscles. Tell your doctor if you have any pain that does not go away or that gets worse. Tell your dentist and dental surgeon that you are taking this medicine. You should not have major dental surgery while on this medicine. See your dentist to have a dental exam and fix any dental problems before starting this medicine. Take good care of your teeth while on this medicine. Make sure you see your dentist for regular follow-up appointments. What side effects may I notice from receiving this medicine? Side effects that   you should report to your doctor or health care professional as soon as possible: -allergic reactions like skin rash, itching or hives, swelling of the face, lips, or tongue -anxiety, confusion, or depression -breathing problems -changes in vision -eye pain -feeling faint or  lightheaded, falls -jaw pain, especially after dental work -mouth sores -muscle cramps, stiffness, or weakness -trouble passing urine or change in the amount of urine Side effects that usually do not require medical attention (report to your doctor or health care professional if they continue or are bothersome): -bone, joint, or muscle pain -constipation -diarrhea -fever -hair loss -irritation at site where injected -loss of appetite -nausea, vomiting -stomach upset -trouble sleeping -trouble swallowing -weak or tired This list may not describe all possible side effects. Call your doctor for medical advice about side effects. You may report side effects to FDA at 1-800-FDA-1088. Where should I keep my medicine? This drug is given in a hospital or clinic and will not be stored at home. NOTE: This sheet is a summary. It may not cover all possible information. If you have questions about this medicine, talk to your doctor, pharmacist, or health care provider.  2014, [employer]. (2013-03-31 13:03:13)   Fulvestrant injection What is this medicine? FULVESTRANT (ful VES trant) blocks the effects of estrogen. It is used to treat breast cancer in women past the age of menopause. This medicine may be used for other purposes; ask your health care provider or pharmacist if you have questions. COMMON BRAND NAME(S): FASLODEX What should I tell my health care provider before I take this medicine? They need to know if you have any of these conditions: -bleeding problems -liver disease -low levels of platelets in the blood -an unusual or allergic reaction to fulvestrant, other medicines, foods, dyes, or preservatives -pregnant or trying to get pregnant -breast-feeding How should I use this medicine? This medicine is for injection into a muscle. It is usually given by a health care professional in a hospital or clinic setting. Talk to your pediatrician regarding the use of this  medicine in children. Special care may be needed. Overdosage: If you think you have taken too much of this medicine contact a poison control center or emergency room at once. NOTE: This medicine is only for you. Do not share this medicine with others. What if I miss a dose? It is important not to miss your dose. Call your doctor or health care professional if you are unable to keep an appointment. What may interact with this medicine? -medicines that treat or prevent blood clots like warfarin, enoxaparin, and dalteparin This list may not describe all possible interactions. Give your health care provider a list of all the medicines, herbs, non-prescription drugs, or dietary supplements you use. Also tell them if you smoke, drink alcohol, or use illegal drugs. Some items may interact with your medicine. What should I watch for while using this medicine? Your condition will be monitored carefully while you are receiving this medicine. You will need important blood work done while you are taking this medicine. Do not become pregnant while taking this medicine. Women should inform their doctor if they wish to become pregnant or think they might be pregnant. There is a potential for serious side effects to an unborn child. Talk to your health care professional or pharmacist for more information. What side effects may I notice from receiving this medicine? Side effects that you should report to your doctor or health care professional as soon as possible: -allergic   reactions like skin rash, itching or hives, swelling of the face, lips, or tongue -feeling faint or lightheaded, falls -fever or flu-like symptoms -sore throat -vaginal bleeding Side effects that usually do not require medical attention (report to your doctor or health care professional if they continue or are bothersome): -aches, pains -constipation or diarrhea -headache -hot flashes -nausea, vomiting -pain at site where  injected -stomach pain This list may not describe all possible side effects. Call your doctor for medical advice about side effects. You may report side effects to FDA at 1-800-FDA-1088. Where should I keep my medicine? This drug is given in a hospital or clinic and will not be stored at home. NOTE: This sheet is a summary. It may not cover all possible information. If you have questions about this medicine, talk to your doctor, pharmacist, or health care provider.  2014, [employer]. (2008-02-28 15:39:24)  

## 2013-09-27 NOTE — Telephone Encounter (Signed)
appts made and printed. Pt is aware that tx will be added. i emailed MW to add the tx's...td 
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
## 2013-09-28 ENCOUNTER — Ambulatory Visit (HOSPITAL_COMMUNITY)
Admission: RE | Admit: 2013-09-28 | Discharge: 2013-09-28 | Disposition: A | Discharge status: Home / Self Care | Payer: Medicaid Other | Source: Ambulatory Visit | Attending: Oncology | Admitting: Oncology

## 2013-09-28 DIAGNOSIS — C50919 Malignant neoplasm of unspecified site of unspecified female breast: Secondary | ICD-10-CM | POA: Insufficient documentation

## 2013-09-28 DIAGNOSIS — E041 Nontoxic single thyroid nodule: Secondary | ICD-10-CM | POA: Insufficient documentation

## 2013-09-28 DIAGNOSIS — C50411 Malignant neoplasm of upper-outer quadrant of right female breast: Secondary | ICD-10-CM

## 2013-09-30 ENCOUNTER — Telehealth: Payer: Self-pay | Admitting: *Deleted

## 2013-09-30 NOTE — Telephone Encounter (Signed)
Per staff message and POF I have scheduled appts.  JMW  

## 2013-10-01 ENCOUNTER — Other Ambulatory Visit: Payer: Self-pay | Admitting: Oncology

## 2013-10-01 MED ORDER — LEVOTHYROXINE SODIUM 25 MCG PO TABS: 25.0000 ug | ORAL_TABLET | Freq: Every day | ORAL | Status: DC

## 2013-10-03 ENCOUNTER — Telehealth (INDEPENDENT_AMBULATORY_CARE_PROVIDER_SITE_OTHER): Payer: Self-pay | Admitting: General Surgery

## 2013-10-03 NOTE — Telephone Encounter (Signed)
Pt cancelled surgery 10/10/13 stated medicaid termed needs to renew

## 2013-10-05 ENCOUNTER — Inpatient Hospital Stay (HOSPITAL_COMMUNITY): Admission: RE | Admit: 2013-10-05 | Payer: Medicaid Other | Source: Ambulatory Visit

## 2013-10-05 ENCOUNTER — Telehealth: Payer: Self-pay | Admitting: *Deleted

## 2013-10-05 NOTE — Telephone Encounter (Signed)
Patient called very worried and concerned that she needs to postpone her treatments due to SSI going to disability and she has to re-apply. Instructed patient to call Raquel Browning in Financial Advice dept before we do this. If there is a lapse of coverage we can moved all appts out 1-2 weeks until she gains insurance coverage again. Informed patient that we would have to move it all out so treatments are spaced every 4 weeks.

## 2013-10-07 ENCOUNTER — Encounter: Payer: Self-pay | Admitting: Oncology

## 2013-10-07 NOTE — Progress Notes (Signed)
Patient called to advise she is appealing her medicaid with legal aide because they have her at a 5000.00 deductible. She has applied for obama care also and that is where she got with legal aide to see why deductible. She is concerned with treatment at end of month and insurance would be eff 11/03/13. I advised medicaid would go back, but she needs to find out about other. If not that will be self pay. She will call me next week, and should know something.

## 2013-10-10 ENCOUNTER — Encounter (HOSPITAL_COMMUNITY): Admission: RE | Payer: Self-pay | Source: Ambulatory Visit

## 2013-10-10 ENCOUNTER — Ambulatory Visit (HOSPITAL_COMMUNITY): Admission: RE | Admit: 2013-10-10 | Payer: Medicaid Other | Source: Ambulatory Visit | Admitting: General Surgery

## 2013-10-10 SURGERY — WIDE EXCISION, LESION, UPPER EXTREMITY
Anesthesia: General

## 2013-10-14 ENCOUNTER — Encounter: Payer: Self-pay | Admitting: *Deleted

## 2013-10-14 ENCOUNTER — Telehealth (HOSPITAL_COMMUNITY): Payer: Self-pay | Admitting: *Deleted

## 2013-10-14 NOTE — Telephone Encounter (Signed)
Was called by Raquel the financial counselor in regards to patients BCCCP Medicaid. Patient was diagnosed with Breast Cancer through BCCCP in 2010 at the [facility], which is no longer providing the BCCCP program. Completed patients 5081 paperwork for her BCCCP Medicaid and faxed to DSS. Called patient back to let her know have taken care of the 5081 and faxed to DSS. Let her know that Rona Ravens is working on getting the paperwork she requested for the retro coverage. Informed her that the Health Department is no longer providing BCCCP. Patient concerned about her retro insurance. Let her know she can feel free to follow up with DSS in a couple of weeks if don't hear back. Patient stated she doesn't have a couple weeks. Let her know she can call them sooner if has questions in regards to her Medicaid approval. Patient verbalized understanding.

## 2013-10-21 ENCOUNTER — Ambulatory Visit: Payer: Medicaid Other

## 2013-10-25 ENCOUNTER — Ambulatory Visit (HOSPITAL_BASED_OUTPATIENT_CLINIC_OR_DEPARTMENT_OTHER): Payer: Medicaid Other

## 2013-10-25 ENCOUNTER — Other Ambulatory Visit (HOSPITAL_BASED_OUTPATIENT_CLINIC_OR_DEPARTMENT_OTHER): Payer: Medicaid Other

## 2013-10-25 ENCOUNTER — Ambulatory Visit: Payer: Medicaid Other

## 2013-10-25 VITALS — BP 120/81 | HR 100 | Temp 98.5°F

## 2013-10-25 DIAGNOSIS — C7951 Secondary malignant neoplasm of bone: Secondary | ICD-10-CM

## 2013-10-25 DIAGNOSIS — C50419 Malignant neoplasm of upper-outer quadrant of unspecified female breast: Secondary | ICD-10-CM

## 2013-10-25 DIAGNOSIS — Z5111 Encounter for antineoplastic chemotherapy: Secondary | ICD-10-CM

## 2013-10-25 DIAGNOSIS — C50919 Malignant neoplasm of unspecified site of unspecified female breast: Secondary | ICD-10-CM

## 2013-10-25 DIAGNOSIS — C50411 Malignant neoplasm of upper-outer quadrant of right female breast: Secondary | ICD-10-CM

## 2013-10-25 LAB — BASIC METABOLIC PANEL (CC13)
Calcium: 9.3 mg/dL (ref 8.4–10.4)
Chloride: 105 mEq/L (ref 98–109)
Creatinine: 0.6 mg/dL (ref 0.6–1.1)
Sodium: 138 mEq/L (ref 136–145)

## 2013-10-25 LAB — TSH CHCC: TSH: 0.615 m(IU)/L (ref 0.308–3.960)

## 2013-10-25 MED ORDER — ZOLEDRONIC ACID 4 MG/100ML IV SOLN
4.0000 mg | Freq: Once | INTRAVENOUS | Status: AC
  Administered 2013-10-25: 4 mg via INTRAVENOUS
  Filled 2013-10-25: qty 100

## 2013-10-25 MED ORDER — FULVESTRANT 250 MG/5ML IM SOLN
500.0000 mg | Freq: Once | INTRAMUSCULAR | Status: AC
  Administered 2013-10-25: 500 mg via INTRAMUSCULAR
  Filled 2013-10-25: qty 10

## 2013-10-25 NOTE — Telephone Encounter (Signed)
appts made and printed...td 

## 2013-10-25 NOTE — Patient Instructions (Signed)
Fulvestrant injection What is this medicine? FULVESTRANT (ful VES trant) blocks the effects of estrogen. It is used to treat breast cancer in women past the age of menopause. This medicine may be used for other purposes; ask your health care provider or pharmacist if you have questions. COMMON BRAND NAME(S): FASLODEX What should I tell my health care provider before I take this medicine? They need to know if you have any of these conditions: -bleeding problems -liver disease -low levels of platelets in the blood -an unusual or allergic reaction to fulvestrant, other medicines, foods, dyes, or preservatives -pregnant or trying to get pregnant -breast-feeding How should I use this medicine? This medicine is for injection into a muscle. It is usually given by a health care professional in a hospital or clinic setting. Talk to your pediatrician regarding the use of this medicine in children. Special care may be needed. Overdosage: If you think you have taken too much of this medicine contact a poison control center or emergency room at once. NOTE: This medicine is only for you. Do not share this medicine with others. What if I miss a dose? It is important not to miss your dose. Call your doctor or health care professional if you are unable to keep an appointment. What may interact with this medicine? -medicines that treat or prevent blood clots like warfarin, enoxaparin, and dalteparin This list may not describe all possible interactions. Give your health care provider a list of all the medicines, herbs, non-prescription drugs, or dietary supplements you use. Also tell them if you smoke, drink alcohol, or use illegal drugs. Some items may interact with your medicine. What should I watch for while using this medicine? Your condition will be monitored carefully while you are receiving this medicine. You will need important blood work done while you are taking this medicine. Do not become pregnant  while taking this medicine. Women should inform their doctor if they wish to become pregnant or think they might be pregnant. There is a potential for serious side effects to an unborn child. Talk to your health care professional or pharmacist for more information. What side effects may I notice from receiving this medicine? Side effects that you should report to your doctor or health care professional as soon as possible: -allergic reactions like skin rash, itching or hives, swelling of the face, lips, or tongue -feeling faint or lightheaded, falls -fever or flu-like symptoms -sore throat -vaginal bleeding Side effects that usually do not require medical attention (report to your doctor or health care professional if they continue or are bothersome): -aches, pains -constipation or diarrhea -headache -hot flashes -nausea, vomiting -pain at site where injected -stomach pain This list may not describe all possible side effects. Call your doctor for medical advice about side effects. You may report side effects to FDA at 1-800-FDA-1088. Where should I keep my medicine? This drug is given in a hospital or clinic and will not be stored at home. NOTE: This sheet is a summary. It may not cover all possible information. If you have questions about this medicine, talk to your doctor, pharmacist, or health care provider.  2014, Elsevier/Gold Standard. (2008-02-28 15:39:24) Zoledronic Acid injection (Hypercalcemia, Oncology) What is this medicine? ZOLEDRONIC ACID (ZOE le dron ik AS id) lowers the amount of calcium loss from bone. It is used to treat too much calcium in your blood from cancer. It is also used to prevent complications of cancer that has spread to the bone. This medicine may   be used for other purposes; ask your health care provider or pharmacist if you have questions. COMMON BRAND NAME(S): Zometa What should I tell my health care provider before I take this medicine? They need to know  if you have any of these conditions: -aspirin-sensitive asthma -cancer, especially if you are receiving medicines used to treat cancer -dental disease or wear dentures -infection -kidney disease -receiving corticosteroids like dexamethasone or prednisone -an unusual or allergic reaction to zoledronic acid, other medicines, foods, dyes, or preservatives -pregnant or trying to get pregnant -breast-feeding How should I use this medicine? This medicine is for infusion into a vein. It is given by a health care professional in a hospital or clinic setting. Talk to your pediatrician regarding the use of this medicine in children. Special care may be needed. Overdosage: If you think you have taken too much of this medicine contact a poison control center or emergency room at once. NOTE: This medicine is only for you. Do not share this medicine with others. What if I miss a dose? It is important not to miss your dose. Call your doctor or health care professional if you are unable to keep an appointment. What may interact with this medicine? -certain antibiotics given by injection -NSAIDs, medicines for pain and inflammation, like ibuprofen or naproxen -some diuretics like bumetanide, furosemide -teriparatide -thalidomide This list may not describe all possible interactions. Give your health care provider a list of all the medicines, herbs, non-prescription drugs, or dietary supplements you use. Also tell them if you smoke, drink alcohol, or use illegal drugs. Some items may interact with your medicine. What should I watch for while using this medicine? Visit your doctor or health care professional for regular checkups. It may be some time before you see the benefit from this medicine. Do not stop taking your medicine unless your doctor tells you to. Your doctor may order blood tests or other tests to see how you are doing. Women should inform their doctor if they wish to become pregnant or think  they might be pregnant. There is a potential for serious side effects to an unborn child. Talk to your health care professional or pharmacist for more information. You should make sure that you get enough calcium and vitamin D while you are taking this medicine. Discuss the foods you eat and the vitamins you take with your health care professional. Some people who take this medicine have severe bone, joint, and/or muscle pain. This medicine may also increase your risk for jaw problems or a broken thigh bone. Tell your doctor right away if you have severe pain in your jaw, bones, joints, or muscles. Tell your doctor if you have any pain that does not go away or that gets worse. Tell your dentist and dental surgeon that you are taking this medicine. You should not have major dental surgery while on this medicine. See your dentist to have a dental exam and fix any dental problems before starting this medicine. Take good care of your teeth while on this medicine. Make sure you see your dentist for regular follow-up appointments. What side effects may I notice from receiving this medicine? Side effects that you should report to your doctor or health care professional as soon as possible: -allergic reactions like skin rash, itching or hives, swelling of the face, lips, or tongue -anxiety, confusion, or depression -breathing problems -changes in vision -eye pain -feeling faint or lightheaded, falls -jaw pain, especially after dental work -mouth sores -muscle cramps,   stiffness, or weakness -trouble passing urine or change in the amount of urine Side effects that usually do not require medical attention (report to your doctor or health care professional if they continue or are bothersome): -bone, joint, or muscle pain -constipation -diarrhea -fever -hair loss -irritation at site where injected -loss of appetite -nausea, vomiting -stomach upset -trouble sleeping -trouble swallowing -weak or  tired This list may not describe all possible side effects. Call your doctor for medical advice about side effects. You may report side effects to FDA at 1-800-FDA-1088. Where should I keep my medicine? This drug is given in a hospital or clinic and will not be stored at home. NOTE: This sheet is a summary. It may not cover all possible information. If you have questions about this medicine, talk to your doctor, pharmacist, or health care provider.  2014, Elsevier/Gold Standard. (2013-03-31 13:03:13)  

## 2013-10-31 ENCOUNTER — Encounter (HOSPITAL_COMMUNITY): Payer: Self-pay

## 2013-10-31 ENCOUNTER — Inpatient Hospital Stay (HOSPITAL_COMMUNITY)
Admission: AD | Admit: 2013-10-31 | Discharge: 2013-10-31 | Disposition: A | Discharge status: Home / Self Care | Payer: Medicaid Other | Source: Ambulatory Visit | Attending: Obstetrics and Gynecology | Admitting: Obstetrics and Gynecology

## 2013-10-31 DIAGNOSIS — R350 Frequency of micturition: Secondary | ICD-10-CM | POA: Insufficient documentation

## 2013-10-31 DIAGNOSIS — Z90710 Acquired absence of both cervix and uterus: Secondary | ICD-10-CM | POA: Insufficient documentation

## 2013-10-31 DIAGNOSIS — R300 Dysuria: Secondary | ICD-10-CM | POA: Insufficient documentation

## 2013-10-31 DIAGNOSIS — B373 Candidiasis of vulva and vagina: Secondary | ICD-10-CM | POA: Insufficient documentation

## 2013-10-31 DIAGNOSIS — L293 Anogenital pruritus, unspecified: Secondary | ICD-10-CM | POA: Insufficient documentation

## 2013-10-31 DIAGNOSIS — N390 Urinary tract infection, site not specified: Secondary | ICD-10-CM

## 2013-10-31 DIAGNOSIS — B3731 Acute candidiasis of vulva and vagina: Secondary | ICD-10-CM | POA: Insufficient documentation

## 2013-10-31 DIAGNOSIS — C50919 Malignant neoplasm of unspecified site of unspecified female breast: Secondary | ICD-10-CM | POA: Insufficient documentation

## 2013-10-31 LAB — URINALYSIS, ROUTINE W REFLEX MICROSCOPIC
Bilirubin Urine: NEGATIVE
Glucose, UA: NEGATIVE mg/dL
Ketones, ur: NEGATIVE mg/dL
Nitrite: NEGATIVE
Protein, ur: 100 mg/dL — AB
Urobilinogen, UA: 0.2 mg/dL (ref 0.0–1.0)

## 2013-10-31 LAB — WET PREP, GENITAL: Clue Cells Wet Prep HPF POC: NONE SEEN

## 2013-10-31 LAB — URINE MICROSCOPIC-ADD ON

## 2013-10-31 MED ORDER — CIPROFLOXACIN HCL 250 MG PO TABS: 250.0000 mg | ORAL_TABLET | Freq: Two times a day (BID) | ORAL | Status: DC

## 2013-10-31 MED ORDER — FLUCONAZOLE 150 MG PO TABS: ORAL_TABLET | ORAL | Status: DC

## 2013-10-31 MED ORDER — PHENAZOPYRIDINE HCL 200 MG PO TABS: 200.0000 mg | ORAL_TABLET | Freq: Three times a day (TID) | ORAL | Status: DC

## 2013-10-31 MED ORDER — FLUCONAZOLE 150 MG PO TABS
150.0000 mg | ORAL_TABLET | Freq: Once | ORAL | Status: AC
  Administered 2013-10-31: 150 mg via ORAL
  Filled 2013-10-31: qty 1

## 2013-10-31 MED ORDER — NYSTATIN 100000 UNITS VA TABS: 1.0000 | ORAL_TABLET | Freq: Every day | VAGINAL | Status: DC

## 2013-10-31 NOTE — MAU Note (Signed)
Pt reports burning in vaginal area after urination, used Monostat without relief, used ?cleocin cream, and is taking cranberry pills. Now having shooting pain in lower abd since last pm

## 2013-10-31 NOTE — MAU Provider Note (Signed)
History     CSN: [id number]  Arrival date and time: 10/31/13 4098   First Provider Initiated Contact with Patient 10/31/13 [phone]      Chief Complaint  Patient presents with  . Dysuria  . Vaginal Itching   HPI Ms. Tammy Blanchard is a 47 y.o. [phone] who presents to MAU today with complaint of vaginal irritation and voiding symptoms. The patient is a breast cancer patient. She had a hysterectomy with BSO in January 2014. Patient is complaining today of vaginal irritation x 1 week. Patient states that she originally treated with Monistat without relief. She then tried a Clindamycin gel without relief and recently started taking OTC cranberry pills as well. The patient endorses dysuria, urinary frequency, urgency, lower pelvic discomfort, low back pain, nausea without vomiting and subjective fever. The patient states that today she began to have some diffuse abdominal discomfort. She also had 2 days of loose stools ~ 1 week ago that has resolved. The patient denies hematuria or flank pain.   OB History   Grav Para Term Preterm Abortions TAB SAB Ect Mult Living   4 4 3 1      4       Past Medical History  Diagnosis Date  . Hypertension   . Allergy   . GERD (gastroesophageal reflux disease)   . Thyroid disease   . Anemia     resolved 2011  . Hyperlipidemia     controlled  . History of blood transfusion 2009    [facility] -  UNKNOWN NUMBER OF UNITS TRANSFUSED  . 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 Cesarean section    . Wisdom tooth extraction    . Tubal ligation    . Breast surgery  10/2009    right lymp nodes removed  . Left foot surgery    . Abdominal hysterectomy  11/25/2012    Procedure: HYSTERECTOMY ABDOMINAL;  Surgeon: [name], MD;  Location: [facility] ORS;  Service: Gynecology;  Laterality: N/A;  with Bilateral Salpingoopherectomy and Cystoscopy  . Mastectomy complete / simple Bilateral 05/25/2013  . Mass  biopsy  05/25/2013    on abdomen and right chest wall, and Right neck [name] 05/25/2013  . Total mastectomy Bilateral 05/25/2013    Dr Carolynne Edouard   . Total mastectomy Bilateral 05/25/2013    Procedure: Bilateral Total Mastectomy;  Surgeon: Robyne Askew, MD;  Location: [facility] OR;  Service: General;  Laterality: Bilateral;  . Mass biopsy N/A 05/25/2013    Procedure: Biopsy  nodule on abdomen and right chest wall, and Right neck;  Surgeon: Robyne Askew, MD;  Location: [facility] OR;  Service: General;  Laterality: N/A;    Family History  Problem Relation Age of Onset  . Diabetes Mother   . Hypertension Mother   . Diabetes Maternal Aunt   . Heart disease Maternal Aunt   . Hypertension Maternal Aunt   . Stroke Maternal Aunt   . Diabetes Maternal Grandmother   . Heart disease Maternal Grandmother   . Hypertension Maternal Grandmother   . Stroke Maternal Grandmother   . Diabetes Maternal Aunt   . Hypertension Maternal Aunt     History  Substance Use Topics  . Smoking status: Current Some Day Smoker -- 0.50 packs/day for 23 years    Types: Cigarettes    Last Attempt to Quit: 11/25/2012  . Smokeless tobacco: Never Used  . Alcohol Use: Yes  Comment: social    Allergies:  Allergies  Allergen Reactions  . Metronidazole Swelling  . Tramadol Nausea Only    Prescriptions prior to admission  Medication Sig Dispense Refill  . amLODipine (NORVASC) 10 MG tablet TAKE 1 TABLET (10 MG TOTAL) BY MOUTH DAILY.  30 tablet  3  . calcium carbonate (TUMS - DOSED IN MG ELEMENTAL CALCIUM) 500 MG chewable tablet Chew 1 tablet by mouth daily.      . clindamycin (CLEOCIN) 2 % vaginal cream Place 1 Applicatorful vaginally at bedtime.      . cloNIDine (CATAPRES) 0.2 MG tablet Take 1 tablet (0.2 mg total) by mouth at bedtime.  30 tablet  3  . Cranberry-Vitamin C-Probiotic (AZO CRANBERRY PO) Take 2 tablets by mouth.      . docusate sodium (COLACE) 100 MG capsule Take 1 capsule (100 mg total) by mouth 2 (two) times  daily as needed for constipation.  30 capsule  0  . HYDROcodone-acetaminophen (NORCO/VICODIN) 5-325 MG per tablet Take 1-2 tablets by mouth every 8 (eight) hours as needed for pain.  60 tablet  0  . LORazepam (ATIVAN) 0.5 MG tablet Take 1 tablet (0.5 mg total) by mouth every 8 (eight) hours as needed for anxiety (or insomnia).  30 tablet  0  . losartan (COZAAR) 100 MG tablet Take 1 tablet (100 mg total) by mouth daily.  30 tablet  3  . miconazole (MICOTIN) 100 MG vaginal suppository Place 100 mg vaginally at bedtime.      . Fulvestrant (FASLODEX IM) Inject 500 mg into the muscle every 28 (twenty-eight) days.      Marland Kitchen levothyroxine (LEVOTHROID) 25 MCG tablet Take 1 tablet (25 mcg total) by mouth daily before breakfast.  30 tablet  12  . UNABLE TO FIND SIg: RX: mastectomy supplies-L8015 post mastectomy camisole (quantity 2 no refill) and L8000 post surgical bras (quantity  No refill) DX- 174.9 bilateral mastectomy  2 Device  0  . UNABLE TO FIND Mastectomy Supplies  1 Package  10  . Zoledronic Acid (ZOMETA IV) Inject 4 mg into the vein.        Review of Systems  Constitutional: Positive for fever. Negative for malaise/fatigue.  Gastrointestinal: Positive for nausea, abdominal pain and diarrhea. Negative for vomiting and constipation.  Genitourinary: Positive for dysuria, urgency and frequency. Negative for hematuria and flank pain.       Neg -vaginal bleeding, discharge  Musculoskeletal: Positive for back pain.   Physical Exam   Blood pressure 127/94, pulse 95, temperature 98.4 F (36.9 C), temperature source Oral, resp. rate 18, height 5\' 3"  (1.6 m), weight 156 lb (70.761 kg), last menstrual period 11/12/2012, SpO2 98.00%.  Physical Exam  Constitutional: She is oriented to person, place, and time. She appears well-developed and well-nourished. No distress.  HENT:  Head: Normocephalic and atraumatic.  Cardiovascular: Normal rate, regular rhythm and normal heart sounds.   Respiratory: Effort  normal and breath sounds normal. No respiratory distress.  GI: Soft. Bowel sounds are normal. She exhibits no distension and no mass. There is tenderness (mild to moderate suprapubic tenderness to palpation at midline). There is CVA tenderness (very mild on the right side only). There is no rebound and no guarding.  Genitourinary: There is no rash or tenderness on the right labia. There is no rash or tenderness on the left labia. There is erythema around the vagina. No bleeding around the vagina. Vaginal discharge (moderate amount of thick, white discharge noted) found.  Uterus and bilateral  adnexa surgically absent. Moderate tenderness to palpation over the bladder region on bimanual exam  Neurological: She is alert and oriented to person, place, and time.  Skin: Skin is warm and dry. No erythema.  Psychiatric: She has a normal mood and affect.   Results for orders placed during the hospital encounter of 10/31/13 (from the past 24 hour(s))  URINALYSIS, ROUTINE W REFLEX MICROSCOPIC     Status: Abnormal   Collection Time    10/31/13  3:34 AM      Result Value Range   Color, Urine YELLOW  YELLOW   APPearance CLOUDY (*) CLEAR   Specific Gravity, Urine 1.020  1.005 - 1.030   pH 6.0  5.0 - 8.0   Glucose, UA NEGATIVE  NEGATIVE mg/dL   Hgb urine dipstick LARGE (*) NEGATIVE   Bilirubin Urine NEGATIVE  NEGATIVE   Ketones, ur NEGATIVE  NEGATIVE mg/dL   Protein, ur 161 (*) NEGATIVE mg/dL   Urobilinogen, UA 0.2  0.0 - 1.0 mg/dL   Nitrite NEGATIVE  NEGATIVE   Leukocytes, UA LARGE (*) NEGATIVE  URINE MICROSCOPIC-ADD ON     Status: Abnormal   Collection Time    10/31/13  3:34 AM      Result Value Range   Squamous Epithelial / LPF RARE  RARE   WBC, UA TOO NUMEROUS TO COUNT  <3 WBC/hpf   RBC / HPF TOO NUMEROUS TO COUNT  <3 RBC/hpf   Bacteria, UA FEW (*) RARE  WET PREP, GENITAL     Status: Abnormal   Collection Time    10/31/13  3:53 AM      Result Value Range   Yeast Wet Prep HPF POC FEW (*) NONE  SEEN   Trich, Wet Prep NONE SEEN  NONE SEEN   Clue Cells Wet Prep HPF POC NONE SEEN  NONE SEEN   WBC, Wet Prep HPF POC FEW (*) NONE SEEN    MAU Course  Procedures None  MDM UA, Wet prep and GC/Chlamydia today Afebrile 150 mg Diflucan given in MAU. Epic flagged Diflucan as a possible reaction with a Metronidazole allergy. Discussed with PharmD. Ok to give to patient.  Assessment and Plan  A: UTI Yeast vulvovaginitis  P: Discharge home Urine culture pending Rx for Cipro, Pyridium and Diflucan given to patient's pharmacy Warning signs for pyelonephritis discussed Patient encouraged to follow-up with her [facility] clinic PRN or if her symptoms were to change or worsen Patient may return to MAU as needed or if her condition were to change or worsen  Freddi Starr, PA-C  10/31/2013, 4:43 AM

## 2013-10-31 NOTE — MAU Provider Note (Signed)
Attestation of Attending Supervision of Advanced Practitioner (CNM/NP): Evaluation and management procedures were performed by the Advanced Practitioner under my supervision and collaboration.  I have reviewed the Advanced Practitioner's note and chart, and I agree with the management and plan.  Jamia Hoban 10/31/2013 10:50 AM   

## 2013-11-01 LAB — GC/CHLAMYDIA PROBE AMP: GC Probe RNA: NEGATIVE

## 2013-11-02 LAB — URINE CULTURE: Colony Count: >=100000

## 2013-11-22 ENCOUNTER — Telehealth: Payer: Self-pay | Admitting: Oncology

## 2013-11-22 ENCOUNTER — Ambulatory Visit: Payer: Medicaid Other

## 2013-11-22 ENCOUNTER — Ambulatory Visit (HOSPITAL_BASED_OUTPATIENT_CLINIC_OR_DEPARTMENT_OTHER): Payer: Medicaid Other | Admitting: Physician Assistant

## 2013-11-22 ENCOUNTER — Encounter: Payer: Self-pay | Admitting: Physician Assistant

## 2013-11-22 ENCOUNTER — Telehealth: Payer: Self-pay | Admitting: *Deleted

## 2013-11-22 ENCOUNTER — Ambulatory Visit (HOSPITAL_BASED_OUTPATIENT_CLINIC_OR_DEPARTMENT_OTHER): Payer: Medicaid Other

## 2013-11-22 ENCOUNTER — Other Ambulatory Visit: Payer: Medicaid Other | Admitting: Lab

## 2013-11-22 ENCOUNTER — Other Ambulatory Visit (HOSPITAL_BASED_OUTPATIENT_CLINIC_OR_DEPARTMENT_OTHER): Payer: Medicaid Other

## 2013-11-22 VITALS — BP 144/86 | HR 86 | Temp 98.2°F | Resp 18 | Ht 63.0 in | Wt 157.5 lb

## 2013-11-22 DIAGNOSIS — C50919 Malignant neoplasm of unspecified site of unspecified female breast: Secondary | ICD-10-CM

## 2013-11-22 DIAGNOSIS — E041 Nontoxic single thyroid nodule: Secondary | ICD-10-CM

## 2013-11-22 DIAGNOSIS — C7952 Secondary malignant neoplasm of bone marrow: Secondary | ICD-10-CM

## 2013-11-22 DIAGNOSIS — C7989 Secondary malignant neoplasm of other specified sites: Principal | ICD-10-CM

## 2013-11-22 DIAGNOSIS — Z5111 Encounter for antineoplastic chemotherapy: Secondary | ICD-10-CM

## 2013-11-22 DIAGNOSIS — C7982 Secondary malignant neoplasm of genital organs: Secondary | ICD-10-CM

## 2013-11-22 DIAGNOSIS — C50411 Malignant neoplasm of upper-outer quadrant of right female breast: Secondary | ICD-10-CM

## 2013-11-22 DIAGNOSIS — C7951 Secondary malignant neoplasm of bone: Secondary | ICD-10-CM

## 2013-11-22 DIAGNOSIS — L989 Disorder of the skin and subcutaneous tissue, unspecified: Secondary | ICD-10-CM

## 2013-11-22 DIAGNOSIS — C773 Secondary and unspecified malignant neoplasm of axilla and upper limb lymph nodes: Secondary | ICD-10-CM

## 2013-11-22 DIAGNOSIS — K219 Gastro-esophageal reflux disease without esophagitis: Secondary | ICD-10-CM

## 2013-11-22 DIAGNOSIS — F329 Major depressive disorder, single episode, unspecified: Secondary | ICD-10-CM

## 2013-11-22 DIAGNOSIS — C50419 Malignant neoplasm of upper-outer quadrant of unspecified female breast: Secondary | ICD-10-CM

## 2013-11-22 DIAGNOSIS — C7960 Secondary malignant neoplasm of unspecified ovary: Secondary | ICD-10-CM

## 2013-11-22 DIAGNOSIS — F3289 Other specified depressive episodes: Secondary | ICD-10-CM

## 2013-11-22 DIAGNOSIS — E876 Hypokalemia: Secondary | ICD-10-CM

## 2013-11-22 DIAGNOSIS — E049 Nontoxic goiter, unspecified: Secondary | ICD-10-CM

## 2013-11-22 DIAGNOSIS — R61 Generalized hyperhidrosis: Secondary | ICD-10-CM

## 2013-11-22 LAB — CBC WITH DIFFERENTIAL/PLATELET
BASO%: 1.2 % (ref 0.0–2.0)
Basophils Absolute: 0.1 10*3/uL (ref 0.0–0.1)
EOS ABS: 0.5 10*3/uL (ref 0.0–0.5)
EOS%: 8.1 % — ABNORMAL HIGH (ref 0.0–7.0)
HEMATOCRIT: 38.2 % (ref 34.8–46.6)
HGB: 12.7 g/dL (ref 11.6–15.9)
LYMPH%: 36.1 % (ref 14.0–49.7)
MCH: 29 pg (ref 25.1–34.0)
MCHC: 33.1 g/dL (ref 31.5–36.0)
MCV: 87.4 fL (ref 79.5–101.0)
MONO#: 0.4 10*3/uL (ref 0.1–0.9)
MONO%: 6.9 % (ref 0.0–14.0)
NEUT#: 2.8 10*3/uL (ref 1.5–6.5)
NEUT%: 47.7 % (ref 38.4–76.8)
PLATELETS: 269 10*3/uL (ref 145–400)
RBC: 4.37 10*6/uL (ref 3.70–5.45)
RDW: 15 % — ABNORMAL HIGH (ref 11.2–14.5)
WBC: 5.9 10*3/uL (ref 3.9–10.3)
lymph#: 2.1 10*3/uL (ref 0.9–3.3)

## 2013-11-22 LAB — BASIC METABOLIC PANEL (CC13)
Anion Gap: 11 mEq/L (ref 3–11)
BUN: 9.6 mg/dL (ref 7.0–26.0)
CO2: 21 meq/L — AB (ref 22–29)
CREATININE: 0.6 mg/dL (ref 0.6–1.1)
Calcium: 10.2 mg/dL (ref 8.4–10.4)
Chloride: 106 mEq/L (ref 98–109)
Glucose: 93 mg/dl (ref 70–140)
Potassium: 4 mEq/L (ref 3.5–5.1)
SODIUM: 138 meq/L (ref 136–145)

## 2013-11-22 LAB — HEPATIC FUNCTION PANEL
ALBUMIN: 4.5 g/dL (ref 3.5–5.2)
ALT: 43 U/L — AB (ref 0–35)
AST: 26 U/L (ref 0–37)
Alkaline Phosphatase: 132 U/L — ABNORMAL HIGH (ref 39–117)
Bilirubin, Direct: 0.1 mg/dL (ref 0.0–0.3)
Indirect Bilirubin: 0.2 mg/dL (ref 0.0–0.9)
TOTAL PROTEIN: 7.3 g/dL (ref 6.0–8.3)
Total Bilirubin: 0.3 mg/dL (ref 0.3–1.2)

## 2013-11-22 MED ORDER — FULVESTRANT 250 MG/5ML IM SOLN
500.0000 mg | Freq: Once | INTRAMUSCULAR | Status: AC
  Administered 2013-11-22: 500 mg via INTRAMUSCULAR
  Filled 2013-11-22: qty 10

## 2013-11-22 MED ORDER — ZOLEDRONIC ACID 4 MG/100ML IV SOLN
4.0000 mg | Freq: Once | INTRAVENOUS | Status: AC
  Administered 2013-11-22: 4 mg via INTRAVENOUS
  Filled 2013-11-22: qty 100

## 2013-11-22 MED ORDER — SODIUM CHLORIDE 0.9 % IV SOLN
Freq: Once | INTRAVENOUS | Status: AC
  Administered 2013-11-22: 12:00:00 via INTRAVENOUS

## 2013-11-22 MED ORDER — OMEPRAZOLE 40 MG PO CPDR: 40.0000 mg | DELAYED_RELEASE_CAPSULE | Freq: Every day | ORAL | Status: DC

## 2013-11-22 NOTE — Telephone Encounter (Signed)
Per staff message and POF I have scheduled appts.  JMW  

## 2013-11-22 NOTE — Patient Instructions (Signed)

## 2013-11-22 NOTE — Patient Instructions (Signed)
Fulvestrant injection What is this medicine? FULVESTRANT (ful VES trant) blocks the effects of estrogen. It is used to treat breast cancer in women past the age of menopause. This medicine may be used for other purposes; ask your health care provider or pharmacist if you have questions. COMMON BRAND NAME(S): FASLODEX What should I tell my health care provider before I take this medicine? They need to know if you have any of these conditions: -bleeding problems -liver disease -low levels of platelets in the blood -an unusual or allergic reaction to fulvestrant, other medicines, foods, dyes, or preservatives -pregnant or trying to get pregnant -breast-feeding How should I use this medicine? This medicine is for injection into a muscle. It is usually given by a health care professional in a hospital or clinic setting. Talk to your pediatrician regarding the use of this medicine in children. Special care may be needed. Overdosage: If you think you have taken too much of this medicine contact a poison control center or emergency room at once. NOTE: This medicine is only for you. Do not share this medicine with others. What if I miss a dose? It is important not to miss your dose. Call your doctor or health care professional if you are unable to keep an appointment. What may interact with this medicine? -medicines that treat or prevent blood clots like warfarin, enoxaparin, and dalteparin This list may not describe all possible interactions. Give your health care provider a list of all the medicines, herbs, non-prescription drugs, or dietary supplements you use. Also tell them if you smoke, drink alcohol, or use illegal drugs. Some items may interact with your medicine. What should I watch for while using this medicine? Your condition will be monitored carefully while you are receiving this medicine. You will need important blood work done while you are taking this medicine. Do not become pregnant  while taking this medicine. Women should inform their doctor if they wish to become pregnant or think they might be pregnant. There is a potential for serious side effects to an unborn child. Talk to your health care professional or pharmacist for more information. What side effects may I notice from receiving this medicine? Side effects that you should report to your doctor or health care professional as soon as possible: -allergic reactions like skin rash, itching or hives, swelling of the face, lips, or tongue -feeling faint or lightheaded, falls -fever or flu-like symptoms -sore throat -vaginal bleeding Side effects that usually do not require medical attention (report to your doctor or health care professional if they continue or are bothersome): -aches, pains -constipation or diarrhea -headache -hot flashes -nausea, vomiting -pain at site where injected -stomach pain This list may not describe all possible side effects. Call your doctor for medical advice about side effects. You may report side effects to FDA at 1-800-FDA-1088. Where should I keep my medicine? This drug is given in a hospital or clinic and will not be stored at home. NOTE: This sheet is a summary. It may not cover all possible information. If you have questions about this medicine, talk to your doctor, pharmacist, or health care provider.  2014, Elsevier/Gold Standard. (2008-02-28 15:39:24)  

## 2013-11-22 NOTE — Telephone Encounter (Signed)
m, °
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
## 2013-12-05 ENCOUNTER — Ambulatory Visit (INDEPENDENT_AMBULATORY_CARE_PROVIDER_SITE_OTHER): Payer: Medicaid Other | Admitting: General Surgery

## 2013-12-05 ENCOUNTER — Encounter (INDEPENDENT_AMBULATORY_CARE_PROVIDER_SITE_OTHER): Payer: Self-pay | Admitting: General Surgery

## 2013-12-05 VITALS — BP 117/77 | HR 70 | Temp 97.8°F | Resp 18 | Ht 63.0 in | Wt 156.0 lb

## 2013-12-05 DIAGNOSIS — C7989 Secondary malignant neoplasm of other specified sites: Principal | ICD-10-CM

## 2013-12-05 DIAGNOSIS — C779 Secondary and unspecified malignant neoplasm of lymph node, unspecified: Secondary | ICD-10-CM

## 2013-12-05 DIAGNOSIS — C50919 Malignant neoplasm of unspecified site of unspecified female breast: Secondary | ICD-10-CM

## 2013-12-05 NOTE — Patient Instructions (Signed)
Call if you want me to remove lesions on right forearm and chest

## 2013-12-05 NOTE — Progress Notes (Signed)
Subjective:     Patient ID: Tammy Blanchard, female   DOB: [birth year], 48 y.o.   MRN: [id number]  HPI Patient is a 48 year old black female who is 6 months status post bilateral mastectomies for metastatic breast cancer. She had a nodule on her chest wall and a nodule in her neck there were also deposits of metastatic breast cancer. She has multiple other nodules on the right medial chest wall. These been stable since her last visit. She had her set up to remove some of these but had a change in her insurance caused a change in the scheduling. At this point she does not want to have surgery unless it is absolutely necessary. She otherwise feels well.  Review of Systems  Constitutional: Negative.   HENT: Negative.   Eyes: Negative.   Respiratory: Negative.   Cardiovascular: Negative.   Gastrointestinal: Negative.   Endocrine: Negative.   Genitourinary: Negative.   Musculoskeletal: Negative.   Skin: Negative.   Allergic/Immunologic: Negative.   Neurological: Negative.   Hematological: Negative.   Psychiatric/Behavioral: Negative.        Objective:   Physical Exam  Constitutional: She is oriented to person, place, and time. She appears well-developed and well-nourished.  HENT:  Head: Normocephalic and atraumatic.  Eyes: Conjunctivae and EOM are normal. Pupils are equal, round, and reactive to light.  Neck: Normal range of motion. Neck supple.  Cardiovascular: Normal rate, regular rhythm and normal heart sounds.   Pulmonary/Chest: Effort normal and breath sounds normal.  There are proximally 6 small nodules on the medial right chest wall beneath the mastectomy incision. Both mastectomy incisions are healing nicely with no sign of infection. There are no palpable nodules on the left chest wall.  Abdominal: Soft. Bowel sounds are normal.  Musculoskeletal: Normal range of motion.  Lymphadenopathy:    She has no cervical adenopathy.  Neurological: She is alert and oriented to person,  place, and time.  Skin: Skin is warm and dry.  Psychiatric: She has a normal mood and affect. Her behavior is normal.       Assessment:     The patient is 6 months status post bilateral mastectomies for metastatic breast cancer     Plan:     At this point she would like to talk with her oncologist about whether these nodules should be biopsied or removed. She will contact us once she has that meeting the last note what her desires R. I've discussed with her in detail the risks and benefits of the operation to remove these nodules as well as some of the technical aspects and she understands and will call us when she is ready

## 2013-12-20 ENCOUNTER — Ambulatory Visit (HOSPITAL_BASED_OUTPATIENT_CLINIC_OR_DEPARTMENT_OTHER): Payer: Medicaid Other

## 2013-12-20 ENCOUNTER — Other Ambulatory Visit (HOSPITAL_BASED_OUTPATIENT_CLINIC_OR_DEPARTMENT_OTHER): Payer: Medicaid Other

## 2013-12-20 ENCOUNTER — Ambulatory Visit: Payer: Medicaid Other

## 2013-12-20 VITALS — BP 128/86 | HR 88 | Temp 98.2°F | Resp 20

## 2013-12-20 DIAGNOSIS — C7951 Secondary malignant neoplasm of bone: Secondary | ICD-10-CM

## 2013-12-20 DIAGNOSIS — C50919 Malignant neoplasm of unspecified site of unspecified female breast: Secondary | ICD-10-CM

## 2013-12-20 DIAGNOSIS — C50411 Malignant neoplasm of upper-outer quadrant of right female breast: Secondary | ICD-10-CM

## 2013-12-20 DIAGNOSIS — Z5111 Encounter for antineoplastic chemotherapy: Secondary | ICD-10-CM

## 2013-12-20 DIAGNOSIS — C7952 Secondary malignant neoplasm of bone marrow: Secondary | ICD-10-CM

## 2013-12-20 DIAGNOSIS — E049 Nontoxic goiter, unspecified: Secondary | ICD-10-CM

## 2013-12-20 DIAGNOSIS — E876 Hypokalemia: Secondary | ICD-10-CM

## 2013-12-20 DIAGNOSIS — C7989 Secondary malignant neoplasm of other specified sites: Principal | ICD-10-CM

## 2013-12-20 DIAGNOSIS — C50419 Malignant neoplasm of upper-outer quadrant of unspecified female breast: Secondary | ICD-10-CM

## 2013-12-20 DIAGNOSIS — C773 Secondary and unspecified malignant neoplasm of axilla and upper limb lymph nodes: Secondary | ICD-10-CM

## 2013-12-20 LAB — COMPREHENSIVE METABOLIC PANEL
ALBUMIN: 4.1 g/dL (ref 3.5–5.2)
ALT: 30 U/L (ref 0–35)
AST: 21 U/L (ref 0–37)
Alkaline Phosphatase: 135 U/L — ABNORMAL HIGH (ref 39–117)
BUN: 8 mg/dL (ref 6–23)
CALCIUM: 9.9 mg/dL (ref 8.4–10.5)
CHLORIDE: 102 meq/L (ref 96–112)
CO2: 24 meq/L (ref 19–32)
Creatinine, Ser: 0.45 mg/dL — ABNORMAL LOW (ref 0.50–1.10)
Glucose, Bld: 101 mg/dL — ABNORMAL HIGH (ref 70–99)
POTASSIUM: 3.7 meq/L (ref 3.5–5.3)
Sodium: 139 mEq/L (ref 135–145)
Total Bilirubin: 0.5 mg/dL (ref 0.3–1.2)
Total Protein: 7.5 g/dL (ref 6.0–8.3)

## 2013-12-20 LAB — CBC WITH DIFFERENTIAL/PLATELET
BASO%: 0.4 % (ref 0.0–2.0)
Basophils Absolute: 0 10*3/uL (ref 0.0–0.1)
EOS%: 7.8 % — AB (ref 0.0–7.0)
Eosinophils Absolute: 0.4 10*3/uL (ref 0.0–0.5)
HCT: 37.5 % (ref 34.8–46.6)
HGB: 12.4 g/dL (ref 11.6–15.9)
LYMPH#: 1.9 10*3/uL (ref 0.9–3.3)
LYMPH%: 41.6 % (ref 14.0–49.7)
MCH: 28.7 pg (ref 25.1–34.0)
MCHC: 33.1 g/dL (ref 31.5–36.0)
MCV: 86.8 fL (ref 79.5–101.0)
MONO#: 0.4 10*3/uL (ref 0.1–0.9)
MONO%: 8.4 % (ref 0.0–14.0)
NEUT#: 1.9 10*3/uL (ref 1.5–6.5)
NEUT%: 41.8 % (ref 38.4–76.8)
Platelets: 274 10*3/uL (ref 145–400)
RBC: 4.32 10*6/uL (ref 3.70–5.45)
RDW: 14.1 % (ref 11.2–14.5)
WBC: 4.5 10*3/uL (ref 3.9–10.3)

## 2013-12-20 LAB — TSH: TSH: 1.251 u[IU]/mL (ref 0.350–4.500)

## 2013-12-20 MED ORDER — FULVESTRANT 250 MG/5ML IM SOLN
500.0000 mg | Freq: Once | INTRAMUSCULAR | Status: AC
  Administered 2013-12-20: 500 mg via INTRAMUSCULAR
  Filled 2013-12-20: qty 10

## 2013-12-20 MED ORDER — SODIUM CHLORIDE 0.9 % IV SOLN
Freq: Once | INTRAVENOUS | Status: AC
  Administered 2013-12-20: 11:00:00 via INTRAVENOUS

## 2013-12-20 MED ORDER — ZOLEDRONIC ACID 4 MG/5ML IV CONC
4.0000 mg | Freq: Once | INTRAVENOUS | Status: AC
  Administered 2013-12-20: 4 mg via INTRAVENOUS
  Filled 2013-12-20: qty 5

## 2013-12-20 NOTE — Patient Instructions (Addendum)
Zoledronic Acid injection (Hypercalcemia, Oncology) What is this medicine? ZOLEDRONIC ACID (ZOE le dron ik AS id) lowers the amount of calcium loss from bone. It is used to treat too much calcium in your blood from cancer. It is also used to prevent complications of cancer that has spread to the bone. This medicine may be used for other purposes; ask your health care provider or pharmacist if you have questions. COMMON BRAND NAME(S): Zometa What should I tell my health care provider before I take this medicine? They need to know if you have any of these conditions: -aspirin-sensitive asthma -cancer, especially if you are receiving medicines used to treat cancer -dental disease or wear dentures -infection -kidney disease -receiving corticosteroids like dexamethasone or prednisone -an unusual or allergic reaction to zoledronic acid, other medicines, foods, dyes, or preservatives -pregnant or trying to get pregnant -breast-feeding How should I use this medicine? This medicine is for infusion into a vein. It is given by a health care professional in a hospital or clinic setting. Talk to your pediatrician regarding the use of this medicine in children. Special care may be needed. Overdosage: If you think you have taken too much of this medicine contact a poison control center or emergency room at once. NOTE: This medicine is only for you. Do not share this medicine with others. What if I miss a dose? It is important not to miss your dose. Call your doctor or health care professional if you are unable to keep an appointment. What may interact with this medicine? -certain antibiotics given by injection -NSAIDs, medicines for pain and inflammation, like ibuprofen or naproxen -some diuretics like bumetanide, furosemide -teriparatide -thalidomide This list may not describe all possible interactions. Give your health care provider a list of all the medicines, herbs, non-prescription drugs, or  dietary supplements you use. Also tell them if you smoke, drink alcohol, or use illegal drugs. Some items may interact with your medicine. What should I watch for while using this medicine? Visit your doctor or health care professional for regular checkups. It may be some time before you see the benefit from this medicine. Do not stop taking your medicine unless your doctor tells you to. Your doctor may order blood tests or other tests to see how you are doing. Women should inform their doctor if they wish to become pregnant or think they might be pregnant. There is a potential for serious side effects to an unborn child. Talk to your health care professional or pharmacist for more information. You should make sure that you get enough calcium and vitamin D while you are taking this medicine. Discuss the foods you eat and the vitamins you take with your health care professional. Some people who take this medicine have severe bone, joint, and/or muscle pain. This medicine may also increase your risk for jaw problems or a broken thigh bone. Tell your doctor right away if you have severe pain in your jaw, bones, joints, or muscles. Tell your doctor if you have any pain that does not go away or that gets worse. Tell your dentist and dental surgeon that you are taking this medicine. You should not have major dental surgery while on this medicine. See your dentist to have a dental exam and fix any dental problems before starting this medicine. Take good care of your teeth while on this medicine. Make sure you see your dentist for regular follow-up appointments. What side effects may I notice from receiving this medicine? Side effects that   you should report to your doctor or health care professional as soon as possible: -allergic reactions like skin rash, itching or hives, swelling of the face, lips, or tongue -anxiety, confusion, or depression -breathing problems -changes in vision -eye pain -feeling faint or  lightheaded, falls -jaw pain, especially after dental work -mouth sores -muscle cramps, stiffness, or weakness -trouble passing urine or change in the amount of urine Side effects that usually do not require medical attention (report to your doctor or health care professional if they continue or are bothersome): -bone, joint, or muscle pain -constipation -diarrhea -fever -hair loss -irritation at site where injected -loss of appetite -nausea, vomiting -stomach upset -trouble sleeping -trouble swallowing -weak or tired This list may not describe all possible side effects. Call your doctor for medical advice about side effects. You may report side effects to FDA at 1-800-FDA-1088. Where should I keep my medicine? This drug is given in a hospital or clinic and will not be stored at home. NOTE: This sheet is a summary. It may not cover all possible information. If you have questions about this medicine, talk to your doctor, pharmacist, or health care provider.  2014, Elsevier/Gold Standard. (2013-03-31 13:03:13)   Fulvestrant injection What is this medicine? FULVESTRANT (ful VES trant) blocks the effects of estrogen. It is used to treat breast cancer in women past the age of menopause. This medicine may be used for other purposes; ask your health care provider or pharmacist if you have questions. COMMON BRAND NAME(S): FASLODEX What should I tell my health care provider before I take this medicine? They need to know if you have any of these conditions: -bleeding problems -liver disease -low levels of platelets in the blood -an unusual or allergic reaction to fulvestrant, other medicines, foods, dyes, or preservatives -pregnant or trying to get pregnant -breast-feeding How should I use this medicine? This medicine is for injection into a muscle. It is usually given by a health care professional in a hospital or clinic setting. Talk to your pediatrician regarding the use of this  medicine in children. Special care may be needed. Overdosage: If you think you have taken too much of this medicine contact a poison control center or emergency room at once. NOTE: This medicine is only for you. Do not share this medicine with others. What if I miss a dose? It is important not to miss your dose. Call your doctor or health care professional if you are unable to keep an appointment. What may interact with this medicine? -medicines that treat or prevent blood clots like warfarin, enoxaparin, and dalteparin This list may not describe all possible interactions. Give your health care provider a list of all the medicines, herbs, non-prescription drugs, or dietary supplements you use. Also tell them if you smoke, drink alcohol, or use illegal drugs. Some items may interact with your medicine. What should I watch for while using this medicine? Your condition will be monitored carefully while you are receiving this medicine. You will need important blood work done while you are taking this medicine. Do not become pregnant while taking this medicine. Women should inform their doctor if they wish to become pregnant or think they might be pregnant. There is a potential for serious side effects to an unborn child. Talk to your health care professional or pharmacist for more information. What side effects may I notice from receiving this medicine? Side effects that you should report to your doctor or health care professional as soon as possible: -allergic   reactions like skin rash, itching or hives, swelling of the face, lips, or tongue -feeling faint or lightheaded, falls -fever or flu-like symptoms -sore throat -vaginal bleeding Side effects that usually do not require medical attention (report to your doctor or health care professional if they continue or are bothersome): -aches, pains -constipation or diarrhea -headache -hot flashes -nausea, vomiting -pain at site where  injected -stomach pain This list may not describe all possible side effects. Call your doctor for medical advice about side effects. You may report side effects to FDA at 1-800-FDA-1088. Where should I keep my medicine? This drug is given in a hospital or clinic and will not be stored at home. NOTE: This sheet is a summary. It may not cover all possible information. If you have questions about this medicine, talk to your doctor, pharmacist, or health care provider.  2014, Elsevier/Gold Standard. (2008-02-28 15:39:24)  

## 2013-12-21 ENCOUNTER — Telehealth: Payer: Self-pay | Admitting: *Deleted

## 2013-12-21 NOTE — Telephone Encounter (Signed)
Called pt to inform her of TSH level(1.251)  Pt is not currently taking Levothyroxine. Instructed her not to take this medication per Micah Flesher. Levels are doing fine. Pt verbalized understanding. No further concerns. Message to be forwarded to [facility].

## 2014-01-09 ENCOUNTER — Encounter: Payer: Self-pay | Admitting: Internal Medicine

## 2014-01-09 ENCOUNTER — Ambulatory Visit: Payer: Medicaid Other | Attending: Internal Medicine | Admitting: Internal Medicine

## 2014-01-09 VITALS — BP 131/90 | HR 101 | Temp 98.9°F | Resp 14 | Ht 63.0 in | Wt 158.2 lb

## 2014-01-09 DIAGNOSIS — E039 Hypothyroidism, unspecified: Secondary | ICD-10-CM | POA: Insufficient documentation

## 2014-01-09 DIAGNOSIS — I10 Essential (primary) hypertension: Secondary | ICD-10-CM | POA: Insufficient documentation

## 2014-01-09 DIAGNOSIS — F419 Anxiety disorder, unspecified: Secondary | ICD-10-CM

## 2014-01-09 DIAGNOSIS — F411 Generalized anxiety disorder: Secondary | ICD-10-CM | POA: Insufficient documentation

## 2014-01-09 DIAGNOSIS — C50919 Malignant neoplasm of unspecified site of unspecified female breast: Secondary | ICD-10-CM

## 2014-01-09 DIAGNOSIS — G4700 Insomnia, unspecified: Secondary | ICD-10-CM

## 2014-01-09 DIAGNOSIS — K219 Gastro-esophageal reflux disease without esophagitis: Secondary | ICD-10-CM

## 2014-01-09 MED ORDER — AMLODIPINE BESYLATE 10 MG PO TABS: 10.0000 mg | ORAL_TABLET | Freq: Every day | ORAL | Status: DC

## 2014-01-09 MED ORDER — POLYETHYLENE GLYCOL 3350 17 GM/SCOOP PO POWD: 17.0000 g | Freq: Two times a day (BID) | ORAL | Status: DC | PRN

## 2014-01-09 MED ORDER — LORAZEPAM 0.5 MG PO TABS: 0.5000 mg | ORAL_TABLET | Freq: Three times a day (TID) | ORAL | Status: DC | PRN

## 2014-01-09 MED ORDER — LOSARTAN POTASSIUM 100 MG PO TABS: 100.0000 mg | ORAL_TABLET | Freq: Every day | ORAL | Status: DC

## 2014-01-09 MED ORDER — HYDROCORTISONE ACETATE 25 MG RE SUPP: 25.0000 mg | Freq: Two times a day (BID) | RECTAL | Status: DC

## 2014-01-09 MED ORDER — OMEPRAZOLE 40 MG PO CPDR: 40.0000 mg | DELAYED_RELEASE_CAPSULE | Freq: Every day | ORAL | Status: DC

## 2014-01-09 MED ORDER — CLONIDINE HCL 0.2 MG PO TABS: 0.2000 mg | ORAL_TABLET | Freq: Every day | ORAL | Status: DC

## 2014-01-09 MED ORDER — DOCUSATE SODIUM 100 MG PO CAPS: 100.0000 mg | ORAL_CAPSULE | Freq: Two times a day (BID) | ORAL | Status: DC | PRN

## 2014-01-09 NOTE — Progress Notes (Signed)
Patient is here for an office visit. Patient requests medication refill. Bp today is 131/90, pulse of 101. Patient stated that she recently smoked a cigarette about 30 minutes ago. No pain today. Patient request medication for hemorrhoids; suppositories.

## 2014-01-09 NOTE — Progress Notes (Signed)
Patient ID: Tammy Blanchard, female   DOB: 10-Apr-1966, 48 y.o.   MRN: [id number]   CC:  HPI: 48 year old female with a history of breast cancer currently under the treatment of Dr. [name] getting monthly infusions of chemotherapy drug, comes in for a follow up of her hypertension. The patient is on 3 antihypertensive medications. Blood pressure is well controlled today Patient has no complaints, she wants to do for doing a cholesterol panel today. The patient was also offered to be checked for hepatitis B titer to see if she is immune to it. The patient wants to defer all blood work to the [facility].      Allergies  Allergen Reactions  . Metronidazole Swelling  . Tramadol Nausea Only   Past Medical History  Diagnosis Date  . Hypertension   . Allergy   . GERD (gastroesophageal reflux disease)   . Thyroid disease   . Anemia     resolved 2011  . Hyperlipidemia     controlled  . History of blood transfusion 2009    [facility] -  UNKNOWN NUMBER OF UNITS TRANSFUSED  . Breast cancer 10/2009, 2014    ER+/PR+/Her2-   Current Outpatient Prescriptions on File Prior to Visit  Medication Sig Dispense Refill  . calcium carbonate (TUMS - DOSED IN MG ELEMENTAL CALCIUM) 500 MG chewable tablet Chew 1 tablet by mouth daily.      . Cranberry-Vitamin C-Probiotic (AZO CRANBERRY PO) Take 2 tablets by mouth.      . Fulvestrant (FASLODEX IM) Inject 500 mg into the muscle every 28 (twenty-eight) days.      Mar[facility] Kitchen UNABLE TO FIND SIg: RX: mastectomy supplies-L8015 post mastectomy camisole (quantity 2 no refill) and L8000 post surgical bras (quantity  No refill) DX- 174.9 bilateral mastectomy  2 Device  0  . UNABLE TO FIND Mastectomy Supplies  1 Package  10  . Zoledronic Acid (ZOMETA IV) Inject 4 mg into the vein.       No current facility-administered medications on file prior to visit.   Family History  Problem Relation Age of Onset  . Diabetes Mother   . Hypertension Mother   . Diabetes Maternal Aunt    . Heart disease Maternal Aunt   . Hypertension Maternal Aunt   . Stroke Maternal Aunt   . Diabetes Maternal Grandmother   . Heart disease Maternal Grandmother   . Hypertension Maternal Grandmother   . Stroke Maternal Grandmother   . Diabetes Maternal Aunt   . Hypertension Maternal Aunt    History   Social History  . Marital Status: Widowed    Spouse Name: N/A    Number of Children: N/A  . Years of Education: N/A   Occupational History  . Not on file.   Social History Main Topics  . Smoking status: Current Some Day Smoker -- 0.50 packs/day for 23 years    Types: Cigarettes    Last Attempt to Quit: 11/25/2012  . Smokeless tobacco: Never Used  . Alcohol Use: Yes     Comment: social  . Drug Use: No  . Sexual Activity: Yes    Birth Control/ Protection: Surgical   Other Topics Concern  . Not on file   Social History Narrative  . No narrative on file    Review of Systems  Constitutional: Negative for fever, chills, diaphoresis, activity change, appetite change and fatigue.  HENT: Negative for ear pain, nosebleeds, congestion, facial swelling, rhinorrhea, neck pain, neck stiffness and ear discharge.   Eyes:  Negative for pain, discharge, redness, itching and visual disturbance.  Respiratory: Negative for cough, choking, chest tightness, shortness of breath, wheezing and stridor.   Cardiovascular: Negative for chest pain, palpitations and leg swelling.  Gastrointestinal: Negative for abdominal distention.  Genitourinary: Negative for dysuria, urgency, frequency, hematuria, flank pain, decreased urine volume, difficulty urinating and dyspareunia.  Musculoskeletal: Negative for back pain, joint swelling, arthralgias and gait problem.  Neurological: Negative for dizziness, tremors, seizures, syncope, facial asymmetry, speech difficulty, weakness, light-headedness, numbness and headaches.  Hematological: Negative for adenopathy. Does not bruise/bleed easily.   Psychiatric/Behavioral: Negative for hallucinations, behavioral problems, confusion, dysphoric mood, decreased concentration and agitation.    Objective:   Filed Vitals:   01/09/14 1640  BP: 131/90  Pulse: 101  Temp: 98.9 F (37.2 C)  Resp: 14    Physical Exam  Constitutional: Appears well-developed and well-nourished. No distress.  HENT: Normocephalic. External right and left ear normal. Oropharynx is clear and moist.  Eyes: Conjunctivae and EOM are normal. PERRLA, no scleral icterus.  Neck: Normal ROM. Neck supple. No JVD. No tracheal deviation. No thyromegaly.  CVS: RRR, S1/S2 +, no murmurs, no gallops, no carotid bruit.  Pulmonary: Effort and breath sounds normal, no stridor, rhonchi, wheezes, rales.  Abdominal: Soft. BS +,  no distension, tenderness, rebound or guarding.  Musculoskeletal: Normal range of motion. No edema and no tenderness.  Lymphadenopathy: No lymphadenopathy noted, cervical, inguinal. Neuro: Alert. Normal reflexes, muscle tone coordination. No cranial nerve deficit. Skin: Skin is warm and dry. No rash noted. Not diaphoretic. No erythema. No pallor.  Psychiatric: Normal mood and affect. Behavior, judgment, thought content normal.   Lab Results  Component Value Date   WBC 4.5 12/20/2013   HGB 12.4 12/20/2013   HCT 37.5 12/20/2013   MCV 86.8 12/20/2013   PLT 274 12/20/2013   Lab Results  Component Value Date   CREATININE 0.45* 12/20/2013   BUN 8 12/20/2013   NA 139 12/20/2013   K 3.7 12/20/2013   CL 102 12/20/2013   CO2 24 12/20/2013    No results found for this basename: HGBA1C   Lipid Panel     Component Value Date/Time   CHOL 204* 08/22/2010 0843   TRIG 155* 08/22/2010 0843   HDL 31* 08/22/2010 0843   CHOLHDL 6.6 08/22/2010 0843   VLDL 31 08/22/2010 0843   LDLCALC 142* 08/22/2010 0843       Assessment and plan:   Patient Active Problem List   Diagnosis Date Noted  . Hypokalemia 11/22/2013  . Anxiety 08/30/2013  . Insomnia 08/30/2013  .  Skin lesion of right arm 07/26/2013  . Breast cancer 07/22/2013  . Breast cancer of upper-outer quadrant of right female breast 06/10/2013  . Breast cancer metastasized to bone 05/24/2013  . Vision changes 05/18/2013  . 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 ADVERSE DRUG REACTION 12/05/2010  . MICROSCOPIC HEMATURIA 12/03/2010  . VAGINAL DISCHARGE 12/03/2010  . THYROID MASS 08/02/2009  . HYPERTHYROIDISM 08/02/2009  . JOINT EFFUSION, LEFT KNEE 04/23/2009  . GERD 12/18/2008  . TOBACCO ABUSE 07/26/2008  . REACTIVE AIRWAY DISEASE 07/26/2008  . CALLUSES, FEET, BILATERAL 04/10/2008  . CONSTIPATION 12/02/2007  . SUBSCAPULARIS SPRAIN AND STRAIN 12/02/2007  . ALLERGIC RHINITIS 06/17/2007  . ANEMIA-IRON DEFICIENCY 06/27/2005  . HYPERLIPIDEMIA 02/19/2005  . HYPERTENSION 04/15/2004   Hypothyroidism Patient stopped taking levothyroxine I have advised her to recheck TSH and free T4 in 4-5 months  Hypertension refilled all medications  Anxiety refill lorazepam  Healthcare maintenance  Patient not sure if she is immune to hepatitis B I offered her to check an antibody titer but she deferred all blood work to the [facility]      The patient was given clear instructions to go to ER or return to medical center if symptoms don't improve, worsen or new problems develop. The patient verbalized understanding. The patient was told to call to get any lab results if not heard anything in the next week.

## 2014-01-17 ENCOUNTER — Ambulatory Visit: Payer: Medicaid Other

## 2014-01-17 ENCOUNTER — Other Ambulatory Visit (HOSPITAL_BASED_OUTPATIENT_CLINIC_OR_DEPARTMENT_OTHER): Payer: Medicaid Other

## 2014-01-17 ENCOUNTER — Ambulatory Visit (HOSPITAL_BASED_OUTPATIENT_CLINIC_OR_DEPARTMENT_OTHER): Payer: Medicaid Other

## 2014-01-17 VITALS — BP 127/84 | HR 89 | Temp 98.5°F

## 2014-01-17 DIAGNOSIS — E876 Hypokalemia: Secondary | ICD-10-CM

## 2014-01-17 DIAGNOSIS — C50411 Malignant neoplasm of upper-outer quadrant of right female breast: Secondary | ICD-10-CM

## 2014-01-17 DIAGNOSIS — C7989 Secondary malignant neoplasm of other specified sites: Principal | ICD-10-CM

## 2014-01-17 DIAGNOSIS — C50919 Malignant neoplasm of unspecified site of unspecified female breast: Secondary | ICD-10-CM

## 2014-01-17 DIAGNOSIS — C7952 Secondary malignant neoplasm of bone marrow: Secondary | ICD-10-CM

## 2014-01-17 DIAGNOSIS — C50419 Malignant neoplasm of upper-outer quadrant of unspecified female breast: Secondary | ICD-10-CM

## 2014-01-17 DIAGNOSIS — C7951 Secondary malignant neoplasm of bone: Secondary | ICD-10-CM

## 2014-01-17 DIAGNOSIS — Z5111 Encounter for antineoplastic chemotherapy: Secondary | ICD-10-CM

## 2014-01-17 LAB — COMPREHENSIVE METABOLIC PANEL (CC13)
ALBUMIN: 4.6 g/dL (ref 3.5–5.0)
ALT: 41 U/L (ref 0–55)
AST: 31 U/L (ref 5–34)
Alkaline Phosphatase: 158 U/L — ABNORMAL HIGH (ref 40–150)
Anion Gap: 13 mEq/L — ABNORMAL HIGH (ref 3–11)
BUN: 11.1 mg/dL (ref 7.0–26.0)
CALCIUM: 10.4 mg/dL (ref 8.4–10.4)
CO2: 19 mEq/L — ABNORMAL LOW (ref 22–29)
Chloride: 107 mEq/L (ref 98–109)
Creatinine: 0.7 mg/dL (ref 0.6–1.1)
Glucose: 99 mg/dl (ref 70–140)
POTASSIUM: 4 meq/L (ref 3.5–5.1)
Sodium: 139 mEq/L (ref 136–145)
Total Bilirubin: 0.63 mg/dL (ref 0.20–1.20)
Total Protein: 8.3 g/dL (ref 6.4–8.3)

## 2014-01-17 LAB — CBC WITH DIFFERENTIAL/PLATELET
BASO%: 0.5 % (ref 0.0–2.0)
BASOS ABS: 0 10*3/uL (ref 0.0–0.1)
EOS%: 5.7 % (ref 0.0–7.0)
Eosinophils Absolute: 0.2 10*3/uL (ref 0.0–0.5)
HCT: 38.8 % (ref 34.8–46.6)
HEMOGLOBIN: 12.9 g/dL (ref 11.6–15.9)
LYMPH#: 1.6 10*3/uL (ref 0.9–3.3)
LYMPH%: 37.5 % (ref 14.0–49.7)
MCH: 28.8 pg (ref 25.1–34.0)
MCHC: 33.2 g/dL (ref 31.5–36.0)
MCV: 86.6 fL (ref 79.5–101.0)
MONO#: 0.4 10*3/uL (ref 0.1–0.9)
MONO%: 9 % (ref 0.0–14.0)
NEUT#: 2 10*3/uL (ref 1.5–6.5)
NEUT%: 47.3 % (ref 38.4–76.8)
Platelets: 287 10*3/uL (ref 145–400)
RBC: 4.48 10*6/uL (ref 3.70–5.45)
RDW: 14 % (ref 11.2–14.5)
WBC: 4.2 10*3/uL (ref 3.9–10.3)
nRBC: 0 % (ref 0–0)

## 2014-01-17 MED ORDER — FULVESTRANT 250 MG/5ML IM SOLN
500.0000 mg | Freq: Once | INTRAMUSCULAR | Status: DC
  Filled 2014-01-17: qty 10

## 2014-01-17 MED ORDER — FULVESTRANT 250 MG/5ML IM SOLN
500.0000 mg | Freq: Once | INTRAMUSCULAR | Status: AC
  Administered 2014-01-17: 500 mg via INTRAMUSCULAR
  Filled 2014-01-17: qty 10

## 2014-01-17 MED ORDER — ZOLEDRONIC ACID 4 MG/100ML IV SOLN
4.0000 mg | Freq: Once | INTRAVENOUS | Status: AC
  Administered 2014-01-17: 4 mg via INTRAVENOUS
  Filled 2014-01-17: qty 100

## 2014-01-17 NOTE — Patient Instructions (Addendum)
Zoledronic Acid injection (Hypercalcemia, Oncology) What is this medicine? ZOLEDRONIC ACID (ZOE le dron ik AS id) lowers the amount of calcium loss from bone. It is used to treat too much calcium in your blood from cancer. It is also used to prevent complications of cancer that has spread to the bone. This medicine may be used for other purposes; ask your health care provider or pharmacist if you have questions. COMMON BRAND NAME(S): Zometa What should I tell my health care provider before I take this medicine? They need to know if you have any of these conditions: -aspirin-sensitive asthma -cancer, especially if you are receiving medicines used to treat cancer -dental disease or wear dentures -infection -kidney disease -receiving corticosteroids like dexamethasone or prednisone -an unusual or allergic reaction to zoledronic acid, other medicines, foods, dyes, or preservatives -pregnant or trying to get pregnant -breast-feeding How should I use this medicine? This medicine is for infusion into a vein. It is given by a health care professional in a hospital or clinic setting. Talk to your pediatrician regarding the use of this medicine in children. Special care may be needed. Overdosage: If you think you have taken too much of this medicine contact a poison control center or emergency room at once. NOTE: This medicine is only for you. Do not share this medicine with others. What if I miss a dose? It is important not to miss your dose. Call your doctor or health care professional if you are unable to keep an appointment. What may interact with this medicine? -certain antibiotics given by injection -NSAIDs, medicines for pain and inflammation, like ibuprofen or naproxen -some diuretics like bumetanide, furosemide -teriparatide -thalidomide This list may not describe all possible interactions. Give your health care provider a list of all the medicines, herbs, non-prescription drugs, or  dietary supplements you use. Also tell them if you smoke, drink alcohol, or use illegal drugs. Some items may interact with your medicine. What should I watch for while using this medicine? Visit your doctor or health care professional for regular checkups. It may be some time before you see the benefit from this medicine. Do not stop taking your medicine unless your doctor tells you to. Your doctor may order blood tests or other tests to see how you are doing. Women should inform their doctor if they wish to become pregnant or think they might be pregnant. There is a potential for serious side effects to an unborn child. Talk to your health care professional or pharmacist for more information. You should make sure that you get enough calcium and vitamin D while you are taking this medicine. Discuss the foods you eat and the vitamins you take with your health care professional. Some people who take this medicine have severe bone, joint, and/or muscle pain. This medicine may also increase your risk for jaw problems or a broken thigh bone. Tell your doctor right away if you have severe pain in your jaw, bones, joints, or muscles. Tell your doctor if you have any pain that does not go away or that gets worse. Tell your dentist and dental surgeon that you are taking this medicine. You should not have major dental surgery while on this medicine. See your dentist to have a dental exam and fix any dental problems before starting this medicine. Take good care of your teeth while on this medicine. Make sure you see your dentist for regular follow-up appointments. What side effects may I notice from receiving this medicine? Side effects that   that you should report to your doctor or health care professional as soon as possible: -allergic reactions like skin rash, itching or hives, swelling of the face, lips, or tongue -anxiety, confusion, or depression -breathing problems -changes in vision -eye pain -feeling faint or  lightheaded, falls -jaw pain, especially after dental work -mouth sores -muscle cramps, stiffness, or weakness -trouble passing urine or change in the amount of urine Side effects that usually do not require medical attention (report to your doctor or health care professional if they continue or are bothersome): -bone, joint, or muscle pain -constipation -diarrhea -fever -hair loss -irritation at site where injected -loss of appetite -nausea, vomiting -stomach upset -trouble sleeping -trouble swallowing -weak or tired This list may not describe all possible side effects. Call your doctor for medical advice about side effects. You may report side effects to FDA at 1-800-FDA-1088. Where should I keep my medicine? This drug is given in a hospital or clinic and will not be stored at home. NOTE: This sheet is a summary. It may not cover all possible information. If you have questions about this medicine, talk to your doctor, pharmacist, or health care provider.  2014, Elsevier/Gold Standard. (2013-03-31 13:03:13)   Fulvestrant injection (Faslodex) What is this medicine? FULVESTRANT (ful VES trant) blocks the effects of estrogen. It is used to treat breast cancer in women past the age of menopause. This medicine may be used for other purposes; ask your health care provider or pharmacist if you have questions. COMMON BRAND NAME(S): FASLODEX What should I tell my health care provider before I take this medicine? They need to know if you have any of these conditions: -bleeding problems -liver disease -low levels of platelets in the blood -an unusual or allergic reaction to fulvestrant, other medicines, foods, dyes, or preservatives -pregnant or trying to get pregnant -breast-feeding How should I use this medicine? This medicine is for injection into a muscle. It is usually given by a health care professional in a hospital or clinic setting. Talk to your pediatrician regarding the use  of this medicine in children. Special care may be needed. Overdosage: If you think you have taken too much of this medicine contact a poison control center or emergency room at once. NOTE: This medicine is only for you. Do not share this medicine with others. What if I miss a dose? It is important not to miss your dose. Call your doctor or health care professional if you are unable to keep an appointment. What may interact with this medicine? -medicines that treat or prevent blood clots like warfarin, enoxaparin, and dalteparin This list may not describe all possible interactions. Give your health care provider a list of all the medicines, herbs, non-prescription drugs, or dietary supplements you use. Also tell them if you smoke, drink alcohol, or use illegal drugs. Some items may interact with your medicine. What should I watch for while using this medicine? Your condition will be monitored carefully while you are receiving this medicine. You will need important blood work done while you are taking this medicine. Do not become pregnant while taking this medicine. Women should inform their doctor if they wish to become pregnant or think they might be pregnant. There is a potential for serious side effects to an unborn child. Talk to your health care professional or pharmacist for more information. What side effects may I notice from receiving this medicine? Side effects that you should report to your doctor or health care professional as soon as   allergic reactions like skin rash, itching or hives, swelling of the face, lips, or tongue -feeling faint or lightheaded, falls -fever or flu-like symptoms -sore throat -vaginal bleeding Side effects that usually do not require medical attention (report to your doctor or health care professional if they continue or are bothersome): -aches, pains -constipation or diarrhea -headache -hot flashes -nausea, vomiting -pain at site where  injected -stomach pain This list may not describe all possible side effects. Call your doctor for medical advice about side effects. You may report side effects to FDA at 1-800-FDA-1088. Where should I keep my medicine? This drug is given in a hospital or clinic and will not be stored at home. NOTE: This sheet is a summary. It may not cover all possible information. If you have questions about this medicine, talk to your doctor, pharmacist, or health care provider.  2014, Elsevier/Gold Standard. (2008-02-28 15:39:24)

## 2014-01-19 ENCOUNTER — Telehealth: Payer: Self-pay | Admitting: *Deleted

## 2014-01-19 NOTE — Telephone Encounter (Signed)
This RN contacted pt's insurance - [employer] per denial for PET/CT scheduled for 01/25/2014.  Additional clinical data given including continued elevated AST in pt with known metastatic breast cancer to bone.  Pt under therapy and need to evaluate treatment response.  Per Isaac Bliss reviewer above data entered and request will be re-processed with answer with 1 day.  This RN informed Vaughan Basta in prior approval per above.

## 2014-01-23 IMAGING — CT NM PET TUM IMG RESTAG (PS) SKULL BASE T - THIGH
5 series · 25 of 25 positions shown · IV contrast (350 OM)
Comparison: PET-CT 05/04/2013.

CLINICAL DATA: Subsequent treatment strategy for breast cancer.
Restaging scan.

EXAM:
NUCLEAR MEDICINE PET SKULL BASE TO THIGH
FASTING BLOOD GLUCOSE:  Value: 102mg/dl
TECHNIQUE: 20.3 mCi F-18 FDG was injected intravenously. CT data was obtained
and used for attenuation correction and anatomic localization only.
(This was not acquired as a diagnostic CT examination.) Additional
exam technical data entered on technologist worksheet.

[Series 1: pet ac · axial · 3.3mm · 4.69mm/px · z∈[-879,-9]mm · 6 of 267 slices shown]
[im 1/267]
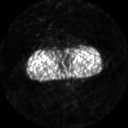
[im 54/267]
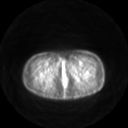
[im 107/267]
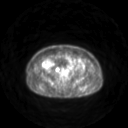
[im 160/267]
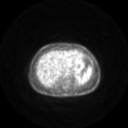
[im 213/267]
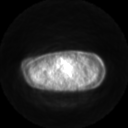
[im 267/267]
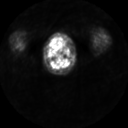

[Series 2: ct images · axial · 3.8mm · 0.98mm/px · z∈[-879,-9]mm · 6 of 266 slices shown]
[im 1/266]
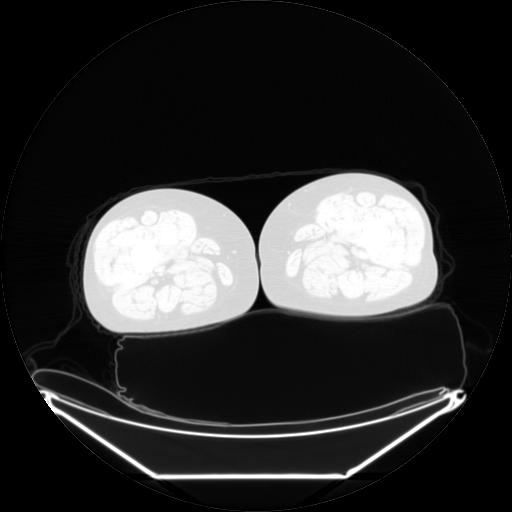
[im 54/266]
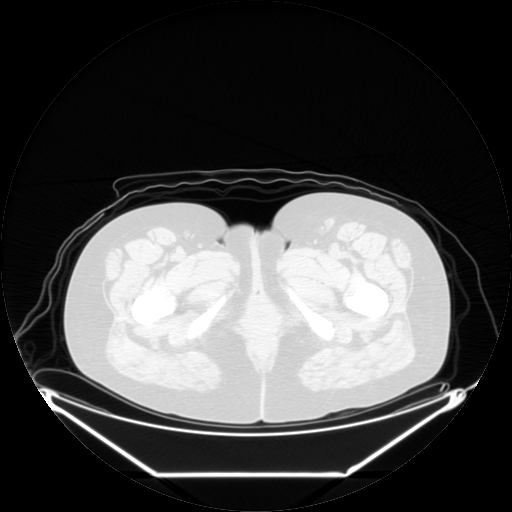
[im 107/266]
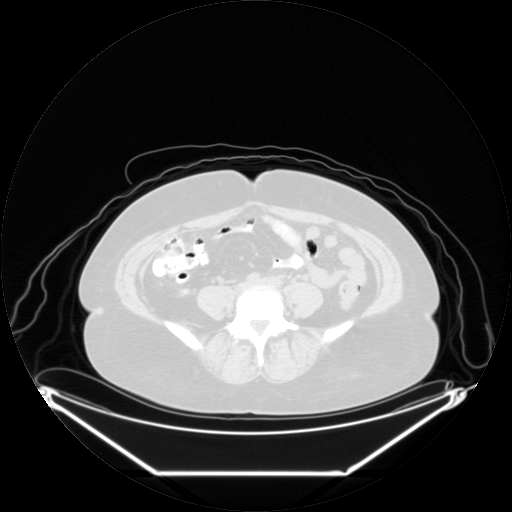
[im 160/266]
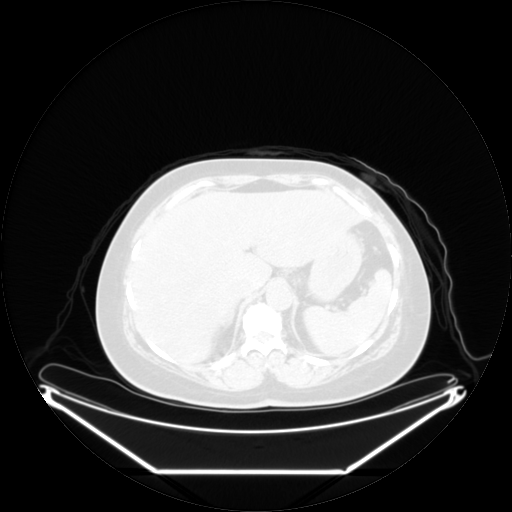
[im 213/266]
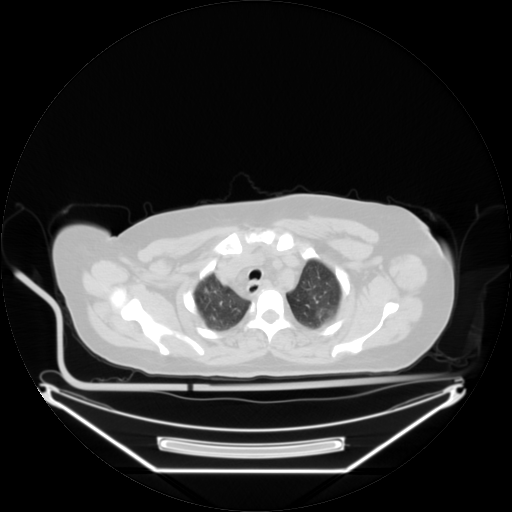
[im 266/266  brain]
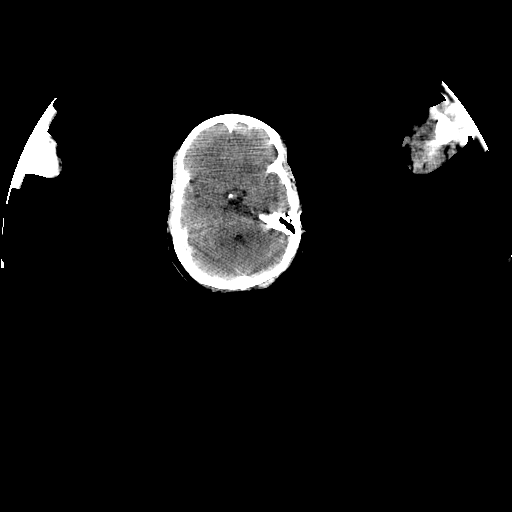

[Series 2: pet nac · axial · 3.3mm · 4.69mm/px · z∈[-879,-9]mm · 6 of 267 slices shown]
[im 1/267]
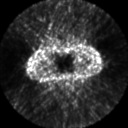
[im 54/267]
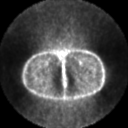
[im 107/267]
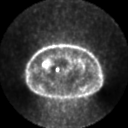
[im 160/267]
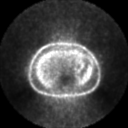
[im 213/267]
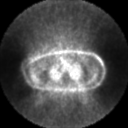
[im 267/267]
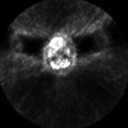

[Series 151: reformatted · axial · 3.3mm · 3.91mm/px · z∈[-879,-9]mm · 6 of 265 slices shown (1 of 2)]
[im 1/265]
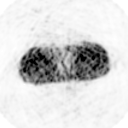
[im 53/265]
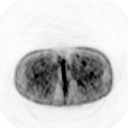
[im 106/265]
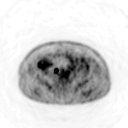
[im 159/265]
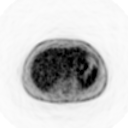
[im 212/265]
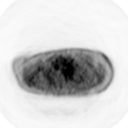
[im 265/265]
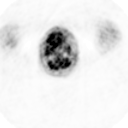

[Series 153: reformatted · coronal · 4.7mm · 6.98mm/px · 1 of 66 slices shown (2 of 2)]
[im 1/66]
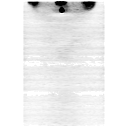

[25 of 25 positions shown; findings below may reference images not displayed]

FINDINGS: NECK

No hypermetabolic lymph nodes in the neck.

CHEST

Compared to the prior examination there has been interval bilateral
modified radical mastectomy and right axillary nodal dissection. In
the lateral aspect of the right breast is an elongated area of soft
tissue prominence which ranges from 36-47 HU. While this may simply
represent a resolving postoperative hematoma, the possibility of a
true soft tissue lesion is not excluded. Notably, there is some
low-level hyper metabolism throughout this region (SUVmax =
4.0-4.1). No other definite soft tissue mass or lymphadenopathy is
identified along the right chest wall or right axilla. No
contralateral chest wall or axillary adenopathy or mass is noted. No
enlarged or hypermetabolic mediastinal or hilar lymph nodes. Heart
size is normal. There is no significant pericardial fluid,
thickening or pericardial calcification. No suspicious pulmonary
nodules or masses are identified. Evidence of air trapping
throughout the lungs bilaterally, suggesting small airways disease.
No acute consolidative airspace disease. No pleural effusions

ABDOMEN/PELVIS

Heterogeneous low attenuation throughout the hepatic parenchyma,
without associated focal hypermetabolism. Findings are favored to
represent heterogeneous areas of hepatic steatosis. No
hypermetabolism focal lesions are identified in the pancreas,
spleen, either adrenal gland or either kidney. Tiny partially
calcified gallstones are dependently within the gallbladder. No
current findings to suggest acute cholecystitis at this time. Small
volume ascites in the pelvis. No pneumoperitoneum. No pathologic
distention of small bowel. No definite enlarged or hypermetabolic
lymph nodes in the abdomen. In the pelvis there is a mildly enlarged
11 mm short axis left external iliac lymph node which is
hypermetabolic (SUVmax = 6.4). No other definite pelvic
lymphadenopathy is identified. Previously noted postoperative
collection along the left pelvic sidewall has resolved, presumably
resolution of a seroma. Status post hysterectomy and bilateral
salpingo oophorectomy.

SKELETON

Diffuse sclerosis without associated hypermetabolism throughout the
visualized axial and appendicular skeleton, most compatible with
widespread treated metastatic disease.
IMPRESSION: 1. Compared to the prior study there has been interval bilateral
modified radical mastectomy and right axillary nodal dissection.
There is some postoperative fluid or soft tissue along the lateral
margin of the right mastectomy bed, which demonstrates some
low-level hypermetabolism. The possibility of residual disease in
this region is not excluded, but is not strongly favored at this
time; rather, this is favored to represent a postoperative hematoma.
This could be further evaluated with ultrasound, or could be
addressed on the followup studies if clinically indicated.
2. Widespread metastatic disease to the bones redemonstrated. These
lesions are all sclerotic and do not demonstrate hypermetabolism at
this time, compatible with treated metastases.
3. Mildly enlarged and hypermetabolic lymph nodes in the left
external iliac nodal chain. This is nonspecific, and given the
resolving postoperative changes in the pelvis, this may simply be
reactive. This particular location would be an unusual site for
metastatic disease from a primary breast lesion. Attention on
followup studies is recommended.
4. New small volume of ascites.
5. Cholelithiasis without evidence to suggest acute cholecystitis at
this time.
6. Hepatic steatosis.
7. Evidence of air trapping in the lungs bilaterally, suggesting
small airway disease.
8. Additional findings, as above.

## 2014-01-24 ENCOUNTER — Telehealth: Payer: Self-pay | Admitting: *Deleted

## 2014-01-24 NOTE — Telephone Encounter (Signed)
This RN was notified by Vaughan Basta in pre authorization as well as radiology that PET scan will need to be cancelled due to non approval by [facility] Medicaid.  Note this RN called last week and gave additional data regarding need for restaging request in pt with known metastatic cancer.  Message left on pt's identified VM per above with request to call this RN.  This note will be given to MD for review and additional recommendations.

## 2014-01-25 ENCOUNTER — Ambulatory Visit (HOSPITAL_COMMUNITY): Payer: Medicaid Other

## 2014-01-26 ENCOUNTER — Other Ambulatory Visit: Payer: Self-pay | Admitting: Oncology

## 2014-01-26 DIAGNOSIS — C50919 Malignant neoplasm of unspecified site of unspecified female breast: Secondary | ICD-10-CM

## 2014-01-30 ENCOUNTER — Telehealth: Payer: Self-pay | Admitting: Oncology

## 2014-01-31 ENCOUNTER — Ambulatory Visit: Payer: Medicaid Other | Admitting: Oncology

## 2014-01-31 ENCOUNTER — Other Ambulatory Visit: Payer: Medicaid Other

## 2014-02-02 ENCOUNTER — Encounter (HOSPITAL_COMMUNITY)
Admission: RE | Admit: 2014-02-02 | Discharge: 2014-02-02 | Disposition: A | Discharge status: Home / Self Care | Payer: Medicaid Other | Source: Ambulatory Visit | Attending: Oncology | Admitting: Oncology

## 2014-02-02 ENCOUNTER — Encounter (HOSPITAL_COMMUNITY): Payer: Self-pay

## 2014-02-02 ENCOUNTER — Ambulatory Visit (HOSPITAL_COMMUNITY)
Admission: RE | Admit: 2014-02-02 | Discharge: 2014-02-02 | Disposition: A | Discharge status: Home / Self Care | Payer: Medicaid Other | Source: Ambulatory Visit | Attending: Oncology | Admitting: Oncology

## 2014-02-02 DIAGNOSIS — C7952 Secondary malignant neoplasm of bone marrow: Secondary | ICD-10-CM

## 2014-02-02 DIAGNOSIS — Z9010 Acquired absence of unspecified breast and nipple: Secondary | ICD-10-CM | POA: Insufficient documentation

## 2014-02-02 DIAGNOSIS — Z9221 Personal history of antineoplastic chemotherapy: Secondary | ICD-10-CM | POA: Insufficient documentation

## 2014-02-02 DIAGNOSIS — N1330 Unspecified hydronephrosis: Secondary | ICD-10-CM | POA: Insufficient documentation

## 2014-02-02 DIAGNOSIS — C50919 Malignant neoplasm of unspecified site of unspecified female breast: Secondary | ICD-10-CM | POA: Insufficient documentation

## 2014-02-02 DIAGNOSIS — R188 Other ascites: Secondary | ICD-10-CM | POA: Insufficient documentation

## 2014-02-02 DIAGNOSIS — C7951 Secondary malignant neoplasm of bone: Secondary | ICD-10-CM | POA: Insufficient documentation

## 2014-02-02 MED ORDER — IOHEXOL 300 MG/ML  SOLN
100.0000 mL | Freq: Once | INTRAMUSCULAR | Status: AC | PRN
  Administered 2014-02-02: 100 mL via INTRAVENOUS

## 2014-02-02 MED ORDER — TECHNETIUM TC 99M MEDRONATE IV KIT
25.0000 | PACK | Freq: Once | INTRAVENOUS | Status: AC | PRN
  Administered 2014-02-02: 25 via INTRAVENOUS

## 2014-02-03 ENCOUNTER — Telehealth: Payer: Self-pay | Admitting: Oncology

## 2014-02-03 ENCOUNTER — Ambulatory Visit (HOSPITAL_BASED_OUTPATIENT_CLINIC_OR_DEPARTMENT_OTHER): Payer: Medicaid Other | Admitting: Oncology

## 2014-02-03 ENCOUNTER — Other Ambulatory Visit (HOSPITAL_BASED_OUTPATIENT_CLINIC_OR_DEPARTMENT_OTHER): Payer: Medicaid Other

## 2014-02-03 VITALS — BP 137/99 | HR 88 | Temp 99.0°F | Resp 18 | Ht 63.0 in | Wt 150.5 lb

## 2014-02-03 DIAGNOSIS — C773 Secondary and unspecified malignant neoplasm of axilla and upper limb lymph nodes: Secondary | ICD-10-CM

## 2014-02-03 DIAGNOSIS — C7989 Secondary malignant neoplasm of other specified sites: Principal | ICD-10-CM

## 2014-02-03 DIAGNOSIS — C7960 Secondary malignant neoplasm of unspecified ovary: Secondary | ICD-10-CM

## 2014-02-03 DIAGNOSIS — C50419 Malignant neoplasm of upper-outer quadrant of unspecified female breast: Secondary | ICD-10-CM

## 2014-02-03 DIAGNOSIS — C7952 Secondary malignant neoplasm of bone marrow: Secondary | ICD-10-CM

## 2014-02-03 DIAGNOSIS — C7951 Secondary malignant neoplasm of bone: Secondary | ICD-10-CM

## 2014-02-03 DIAGNOSIS — F3289 Other specified depressive episodes: Secondary | ICD-10-CM

## 2014-02-03 DIAGNOSIS — C50411 Malignant neoplasm of upper-outer quadrant of right female breast: Secondary | ICD-10-CM

## 2014-02-03 DIAGNOSIS — D509 Iron deficiency anemia, unspecified: Secondary | ICD-10-CM

## 2014-02-03 DIAGNOSIS — I10 Essential (primary) hypertension: Secondary | ICD-10-CM

## 2014-02-03 DIAGNOSIS — C792 Secondary malignant neoplasm of skin: Secondary | ICD-10-CM

## 2014-02-03 DIAGNOSIS — F329 Major depressive disorder, single episode, unspecified: Secondary | ICD-10-CM

## 2014-02-03 DIAGNOSIS — Z170 Estrogen receptor positive status [ER+]: Secondary | ICD-10-CM

## 2014-02-03 DIAGNOSIS — C50919 Malignant neoplasm of unspecified site of unspecified female breast: Secondary | ICD-10-CM

## 2014-02-03 DIAGNOSIS — E876 Hypokalemia: Secondary | ICD-10-CM

## 2014-02-03 LAB — CBC WITH DIFFERENTIAL/PLATELET
BASO%: 0.4 % (ref 0.0–2.0)
BASOS ABS: 0 10*3/uL (ref 0.0–0.1)
EOS ABS: 0.3 10*3/uL (ref 0.0–0.5)
EOS%: 5.6 % (ref 0.0–7.0)
HEMATOCRIT: 40.2 % (ref 34.8–46.6)
HEMOGLOBIN: 13.5 g/dL (ref 11.6–15.9)
LYMPH#: 1.9 10*3/uL (ref 0.9–3.3)
LYMPH%: 38.1 % (ref 14.0–49.7)
MCH: 29.1 pg (ref 25.1–34.0)
MCHC: 33.6 g/dL (ref 31.5–36.0)
MCV: 86.6 fL (ref 79.5–101.0)
MONO#: 0.4 10*3/uL (ref 0.1–0.9)
MONO%: 7.7 % (ref 0.0–14.0)
NEUT%: 48.2 % (ref 38.4–76.8)
NEUTROS ABS: 2.4 10*3/uL (ref 1.5–6.5)
Platelets: 336 10*3/uL (ref 145–400)
RBC: 4.64 10*6/uL (ref 3.70–5.45)
RDW: 14.2 % (ref 11.2–14.5)
WBC: 5 10*3/uL (ref 3.9–10.3)
nRBC: 0 % (ref 0–0)

## 2014-02-03 LAB — COMPREHENSIVE METABOLIC PANEL (CC13)
ALBUMIN: 4.7 g/dL (ref 3.5–5.0)
ALT: 55 U/L (ref 0–55)
AST: 42 U/L — ABNORMAL HIGH (ref 5–34)
Alkaline Phosphatase: 196 U/L — ABNORMAL HIGH (ref 40–150)
Anion Gap: 12 mEq/L — ABNORMAL HIGH (ref 3–11)
BUN: 8.5 mg/dL (ref 7.0–26.0)
CALCIUM: 10.2 mg/dL (ref 8.4–10.4)
CHLORIDE: 108 meq/L (ref 98–109)
CO2: 24 mEq/L (ref 22–29)
Creatinine: 0.7 mg/dL (ref 0.6–1.1)
GLUCOSE: 95 mg/dL (ref 70–140)
POTASSIUM: 3.7 meq/L (ref 3.5–5.1)
Sodium: 143 mEq/L (ref 136–145)
Total Bilirubin: 0.77 mg/dL (ref 0.20–1.20)
Total Protein: 8.2 g/dL (ref 6.4–8.3)

## 2014-02-03 MED ORDER — LEVOTHYROXINE SODIUM 25 MCG PO TABS: 25.0000 ug | ORAL_TABLET | Freq: Every day | ORAL | Status: DC

## 2014-02-03 MED ORDER — OXYCODONE HCL 5 MG PO TABS: 5.0000 mg | ORAL_TABLET | ORAL | Status: DC | PRN
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
## 2014-02-03 NOTE — Telephone Encounter (Signed)
, °

## 2014-02-07 ENCOUNTER — Ambulatory Visit (HOSPITAL_COMMUNITY): Admission: RE | Admit: 2014-02-07 | Payer: Medicaid Other | Source: Ambulatory Visit

## 2014-02-07 ENCOUNTER — Ambulatory Visit (HOSPITAL_COMMUNITY)
Admission: RE | Admit: 2014-02-07 | Discharge: 2014-02-07 | Disposition: A | Discharge status: Home / Self Care | Payer: Medicaid Other | Source: Ambulatory Visit | Attending: Oncology | Admitting: Oncology

## 2014-02-07 ENCOUNTER — Other Ambulatory Visit: Payer: Self-pay | Admitting: Oncology

## 2014-02-07 DIAGNOSIS — K7689 Other specified diseases of liver: Secondary | ICD-10-CM | POA: Insufficient documentation

## 2014-02-07 DIAGNOSIS — C50919 Malignant neoplasm of unspecified site of unspecified female breast: Secondary | ICD-10-CM | POA: Insufficient documentation

## 2014-02-07 DIAGNOSIS — K8020 Calculus of gallbladder without cholecystitis without obstruction: Secondary | ICD-10-CM | POA: Diagnosis not present

## 2014-02-07 DIAGNOSIS — C7951 Secondary malignant neoplasm of bone: Secondary | ICD-10-CM | POA: Diagnosis not present

## 2014-02-07 DIAGNOSIS — C787 Secondary malignant neoplasm of liver and intrahepatic bile duct: Secondary | ICD-10-CM | POA: Insufficient documentation

## 2014-02-07 DIAGNOSIS — C7989 Secondary malignant neoplasm of other specified sites: Secondary | ICD-10-CM

## 2014-02-07 DIAGNOSIS — D509 Iron deficiency anemia, unspecified: Secondary | ICD-10-CM

## 2014-02-07 DIAGNOSIS — C50411 Malignant neoplasm of upper-outer quadrant of right female breast: Secondary | ICD-10-CM

## 2014-02-07 DIAGNOSIS — N135 Crossing vessel and stricture of ureter without hydronephrosis: Secondary | ICD-10-CM | POA: Insufficient documentation

## 2014-02-07 DIAGNOSIS — C7952 Secondary malignant neoplasm of bone marrow: Secondary | ICD-10-CM

## 2014-02-07 MED ORDER — GADOBENATE DIMEGLUMINE 529 MG/ML IV SOLN
14.0000 mL | Freq: Once | INTRAVENOUS | Status: AC | PRN
  Administered 2014-02-07: 14 mL via INTRAVENOUS

## 2014-02-08 ENCOUNTER — Ambulatory Visit (HOSPITAL_BASED_OUTPATIENT_CLINIC_OR_DEPARTMENT_OTHER): Payer: Medicaid Other | Admitting: Oncology

## 2014-02-08 VITALS — BP 132/95 | HR 99 | Temp 99.3°F | Resp 20 | Ht 63.0 in | Wt 150.6 lb

## 2014-02-08 DIAGNOSIS — C50411 Malignant neoplasm of upper-outer quadrant of right female breast: Secondary | ICD-10-CM

## 2014-02-08 DIAGNOSIS — C7989 Secondary malignant neoplasm of other specified sites: Secondary | ICD-10-CM

## 2014-02-08 DIAGNOSIS — C7952 Secondary malignant neoplasm of bone marrow: Secondary | ICD-10-CM

## 2014-02-08 DIAGNOSIS — Z170 Estrogen receptor positive status [ER+]: Secondary | ICD-10-CM

## 2014-02-08 DIAGNOSIS — C7960 Secondary malignant neoplasm of unspecified ovary: Secondary | ICD-10-CM

## 2014-02-08 DIAGNOSIS — C50419 Malignant neoplasm of upper-outer quadrant of unspecified female breast: Secondary | ICD-10-CM

## 2014-02-08 DIAGNOSIS — C792 Secondary malignant neoplasm of skin: Secondary | ICD-10-CM

## 2014-02-08 DIAGNOSIS — C762 Malignant neoplasm of abdomen: Secondary | ICD-10-CM

## 2014-02-08 DIAGNOSIS — C50919 Malignant neoplasm of unspecified site of unspecified female breast: Secondary | ICD-10-CM

## 2014-02-08 DIAGNOSIS — C772 Secondary and unspecified malignant neoplasm of intra-abdominal lymph nodes: Secondary | ICD-10-CM

## 2014-02-08 DIAGNOSIS — C7951 Secondary malignant neoplasm of bone: Secondary | ICD-10-CM

## 2014-02-08 DIAGNOSIS — C787 Secondary malignant neoplasm of liver and intrahepatic bile duct: Secondary | ICD-10-CM

## 2014-02-08 DIAGNOSIS — C773 Secondary and unspecified malignant neoplasm of axilla and upper limb lymph nodes: Secondary | ICD-10-CM

## 2014-02-08 MED ORDER — ONDANSETRON HCL 8 MG PO TABS: 8.0000 mg | ORAL_TABLET | Freq: Two times a day (BID) | ORAL | Status: DC

## 2014-02-08 MED ORDER — PROCHLORPERAZINE MALEATE 10 MG PO TABS: 10.0000 mg | ORAL_TABLET | Freq: Four times a day (QID) | ORAL | Status: DC | PRN

## 2014-02-08 NOTE — Progress Notes (Signed)
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 treatment is control. There are many treatment options but in this setting generally I start with Abraxane, which has a very good track record and can be very well tolerated. We discussed the possible toxicities, side effects and complications of this agent particularly the concerns regarding neuropathy.  They have a family event in [place] the weekend of April 18. We will try to start on April 21. I will call the supportive medicines to her pharmacy which is now the [facility] long pharmacy. She also will benefit from coming to chemotherapy school since it has been a while since she had chemotherapy. She will need a port placed and we will see a doctor [name] can do that before the start treatment date.  We also discussed [facility]. I encouraged her to participate in this  trial, which may uncover additional treatment possibilities for her in the future. She will think about it and let me know if she wishes to participate.  We did not discuss healthcare power of attorney issues today, but we will try to clarify her wishes in that regard at the next visit.  The overall plan will be for a Abraxane days 1 and 8 of every 21 day cycle. We will do 4 cycles and then obtain a restaging MRI. If she tolerates it well and is  working we can continue. Otherwise there are many other standard options or she can consider a clinical trial.  Cielle  as a good understanding of the overall plan. She agrees with it. She knows a goal of treatment in her case is control. She will call with any problems that may develop before her next visit here.    Chauncey Cruel, MD

## 2014-02-09 ENCOUNTER — Other Ambulatory Visit: Payer: Self-pay | Admitting: Oncology

## 2014-02-09 ENCOUNTER — Ambulatory Visit (HOSPITAL_COMMUNITY)
Admission: RE | Admit: 2014-02-09 | Discharge: 2014-02-09 | Disposition: A | Discharge status: Home / Self Care | Payer: Medicaid Other | Source: Ambulatory Visit | Attending: Oncology | Admitting: Oncology

## 2014-02-09 ENCOUNTER — Telehealth: Payer: Self-pay | Admitting: Oncology

## 2014-02-09 ENCOUNTER — Telehealth: Payer: Self-pay | Admitting: Internal Medicine

## 2014-02-09 DIAGNOSIS — C50919 Malignant neoplasm of unspecified site of unspecified female breast: Secondary | ICD-10-CM | POA: Insufficient documentation

## 2014-02-09 DIAGNOSIS — R51 Headache: Secondary | ICD-10-CM | POA: Insufficient documentation

## 2014-02-09 DIAGNOSIS — C50411 Malignant neoplasm of upper-outer quadrant of right female breast: Secondary | ICD-10-CM

## 2014-02-09 DIAGNOSIS — C7989 Secondary malignant neoplasm of other specified sites: Secondary | ICD-10-CM

## 2014-02-09 DIAGNOSIS — C7951 Secondary malignant neoplasm of bone: Secondary | ICD-10-CM | POA: Insufficient documentation

## 2014-02-09 DIAGNOSIS — I6350 Cerebral infarction due to unspecified occlusion or stenosis of unspecified cerebral artery: Secondary | ICD-10-CM | POA: Insufficient documentation

## 2014-02-09 DIAGNOSIS — D509 Iron deficiency anemia, unspecified: Secondary | ICD-10-CM

## 2014-02-09 DIAGNOSIS — C7952 Secondary malignant neoplasm of bone marrow: Secondary | ICD-10-CM

## 2014-02-09 MED ORDER — GADOBENATE DIMEGLUMINE 529 MG/ML IV SOLN
13.0000 mL | Freq: Once | INTRAVENOUS | Status: AC | PRN
  Administered 2014-02-09: 13 mL via INTRAVENOUS

## 2014-02-09 NOTE — Telephone Encounter (Signed)
, °

## 2014-02-09 NOTE — Telephone Encounter (Signed)
[facility] outpatient pharm calling for med refill authorization.  Please f/u with pharmacy

## 2014-02-10 ENCOUNTER — Telehealth: Payer: Self-pay | Admitting: *Deleted

## 2014-02-10 NOTE — Telephone Encounter (Signed)
This RN spoke with pt per her call ( post leaving 2 messages ) per concerns of " I need to see someone about my thyroid " and " I would like to get a second opinion "   Per thyroid concern - Tammy Blanchard states she is having increased issues of " difficulty swallowing and feeling like I am choking " . " I even woke up the other night feeling this way and coughing a lot ".  Tammy Blanchard states she able to hydrate ok " but can only eat in very small bites and then have to wash it down with something ". She feels her throat is more swollen at times " like a lump ".  Tammy Blanchard was able to speak at a normal rate but noted hoarseness in speech.  This RN discussed with Tammy Blanchard probable need for scan or study to assess above concern. Due to this being Friday and later in afternoon- this RN discussed with pt need to proceed to the ER.  Tammy Blanchard then stated " please let Dr [name] and Amy know that I do not want to change my care- and I appreciate everything they are doing and know they are doing their best , but I would like to get a second opinion ", " I never really thought about getting one but now people are saying maybe I should ".  This RN validated pt's verbalization including understanding of desire to see another MD in a different facility for consultation.  Pt was able to tell this RN she has contacted [facility] and is in the process of obtaining an appointment with Dr Tammy Blanchard, " they are supposed to be calling me back with an appointment"  Per end of conversation pt understood of concerns relating to her thyroid/choking concerns as well as she will call this RN with date of consultation so her records can be obtained for referral.  Tammy Blanchard stated appreciation of discussion. No further needs at this time.

## 2014-02-13 ENCOUNTER — Other Ambulatory Visit: Payer: Self-pay | Admitting: *Deleted

## 2014-02-13 ENCOUNTER — Other Ambulatory Visit: Payer: Medicaid Other

## 2014-02-13 ENCOUNTER — Telehealth: Payer: Self-pay | Admitting: Oncology

## 2014-02-13 DIAGNOSIS — E049 Nontoxic goiter, unspecified: Secondary | ICD-10-CM

## 2014-02-13 DIAGNOSIS — C7951 Secondary malignant neoplasm of bone: Principal | ICD-10-CM

## 2014-02-13 DIAGNOSIS — C50919 Malignant neoplasm of unspecified site of unspecified female breast: Secondary | ICD-10-CM

## 2014-02-13 NOTE — Telephone Encounter (Signed)
Faxed pt medical records to [facility] °

## 2014-02-14 ENCOUNTER — Other Ambulatory Visit: Payer: Medicaid Other

## 2014-02-14 ENCOUNTER — Ambulatory Visit: Payer: Self-pay

## 2014-02-15 ENCOUNTER — Ambulatory Visit (HOSPITAL_COMMUNITY)
Admission: RE | Admit: 2014-02-15 | Discharge: 2014-02-15 | Disposition: A | Discharge status: Home / Self Care | Payer: Medicaid Other | Source: Ambulatory Visit | Attending: Oncology | Admitting: Oncology

## 2014-02-15 DIAGNOSIS — E042 Nontoxic multinodular goiter: Secondary | ICD-10-CM | POA: Insufficient documentation

## 2014-02-15 DIAGNOSIS — C50919 Malignant neoplasm of unspecified site of unspecified female breast: Secondary | ICD-10-CM

## 2014-02-15 DIAGNOSIS — E049 Nontoxic goiter, unspecified: Secondary | ICD-10-CM

## 2014-02-15 DIAGNOSIS — C7951 Secondary malignant neoplasm of bone: Secondary | ICD-10-CM

## 2014-02-16 ENCOUNTER — Encounter (HOSPITAL_COMMUNITY): Payer: Self-pay | Admitting: Emergency Medicine

## 2014-02-16 ENCOUNTER — Emergency Department (HOSPITAL_COMMUNITY): Payer: Medicaid Other

## 2014-02-16 ENCOUNTER — Emergency Department (HOSPITAL_COMMUNITY)
Admission: EM | Admit: 2014-02-16 | Discharge: 2014-02-16 | Disposition: A | Discharge status: Home / Self Care | Payer: Medicaid Other | Attending: Emergency Medicine | Admitting: Emergency Medicine

## 2014-02-16 DIAGNOSIS — I10 Essential (primary) hypertension: Secondary | ICD-10-CM | POA: Insufficient documentation

## 2014-02-16 DIAGNOSIS — E785 Hyperlipidemia, unspecified: Secondary | ICD-10-CM | POA: Insufficient documentation

## 2014-02-16 DIAGNOSIS — Z79899 Other long term (current) drug therapy: Secondary | ICD-10-CM | POA: Insufficient documentation

## 2014-02-16 DIAGNOSIS — Z881 Allergy status to other antibiotic agents status: Secondary | ICD-10-CM | POA: Insufficient documentation

## 2014-02-16 DIAGNOSIS — Z9189 Other specified personal risk factors, not elsewhere classified: Secondary | ICD-10-CM | POA: Insufficient documentation

## 2014-02-16 DIAGNOSIS — C50919 Malignant neoplasm of unspecified site of unspecified female breast: Secondary | ICD-10-CM | POA: Insufficient documentation

## 2014-02-16 DIAGNOSIS — E079 Disorder of thyroid, unspecified: Secondary | ICD-10-CM | POA: Insufficient documentation

## 2014-02-16 DIAGNOSIS — K219 Gastro-esophageal reflux disease without esophagitis: Secondary | ICD-10-CM | POA: Insufficient documentation

## 2014-02-16 DIAGNOSIS — R519 Headache, unspecified: Secondary | ICD-10-CM

## 2014-02-16 DIAGNOSIS — R51 Headache: Secondary | ICD-10-CM | POA: Insufficient documentation

## 2014-02-16 DIAGNOSIS — Z885 Allergy status to narcotic agent status: Secondary | ICD-10-CM | POA: Insufficient documentation

## 2014-02-16 DIAGNOSIS — D649 Anemia, unspecified: Secondary | ICD-10-CM | POA: Insufficient documentation

## 2014-02-16 LAB — COMPREHENSIVE METABOLIC PANEL
ALT: 31 U/L (ref 0–35)
AST: 28 U/L (ref 0–37)
Albumin: 3.9 g/dL (ref 3.5–5.2)
Alkaline Phosphatase: 198 U/L — ABNORMAL HIGH (ref 39–117)
BUN: 10 mg/dL (ref 6–23)
CO2: 20 mEq/L (ref 19–32)
Calcium: 9.9 mg/dL (ref 8.4–10.5)
Chloride: 103 mEq/L (ref 96–112)
Creatinine, Ser: 0.53 mg/dL (ref 0.50–1.10)
GFR calc Af Amer: >90 mL/min (ref >90)
GFR calc non Af Amer: >90 mL/min (ref >90)
Glucose, Bld: 92 mg/dL (ref 70–99)
Potassium: 3.6 mEq/L — ABNORMAL LOW (ref 3.7–5.3)
Sodium: 141 mEq/L (ref 137–147)
Total Bilirubin: 0.4 mg/dL (ref 0.3–1.2)
Total Protein: 7.2 g/dL (ref 6.0–8.3)

## 2014-02-16 LAB — CBC WITH DIFFERENTIAL/PLATELET
Basophils Absolute: 0 10*3/uL (ref 0.0–0.1)
Basophils Relative: 0 % (ref 0–1)
Eosinophils Absolute: 0.2 10*3/uL (ref 0.0–0.7)
Eosinophils Relative: 4 % (ref 0–5)
HCT: 38.3 % (ref 36.0–46.0)
Hemoglobin: 12.7 g/dL (ref 12.0–15.0)
Lymphocytes Relative: 41 % (ref 12–46)
Lymphs Abs: 2.1 10*3/uL (ref 0.7–4.0)
MCH: 28.7 pg (ref 26.0–34.0)
MCHC: 33.2 g/dL (ref 30.0–36.0)
MCV: 86.7 fL (ref 78.0–100.0)
Monocytes Absolute: 0.3 10*3/uL (ref 0.1–1.0)
Monocytes Relative: 7 % (ref 3–12)
Neutro Abs: 2.4 10*3/uL (ref 1.7–7.7)
Neutrophils Relative %: 48 % (ref 43–77)
Platelets: 252 10*3/uL (ref 150–400)
RBC: 4.42 MIL/uL (ref 3.87–5.11)
RDW: 13.8 % (ref 11.5–15.5)
WBC: 5.1 10*3/uL (ref 4.0–10.5)

## 2014-02-16 LAB — URINE MICROSCOPIC-ADD ON

## 2014-02-16 LAB — URINALYSIS, ROUTINE W REFLEX MICROSCOPIC
Bilirubin Urine: NEGATIVE
Glucose, UA: NEGATIVE mg/dL
Ketones, ur: NEGATIVE mg/dL
Leukocytes, UA: NEGATIVE
Nitrite: NEGATIVE
Protein, ur: NEGATIVE mg/dL
Specific Gravity, Urine: 1.015 (ref 1.005–1.030)
Urobilinogen, UA: 1 mg/dL (ref 0.0–1.0)
pH: 6.5 (ref 5.0–8.0)

## 2014-02-16 LAB — SEDIMENTATION RATE: Sed Rate: 12 mm/hr (ref 0–22)

## 2014-02-16 LAB — CBG MONITORING, ED: Glucose-Capillary: 103 mg/dL — ABNORMAL HIGH (ref 70–99)

## 2014-02-16 NOTE — ED Notes (Signed)
She woke up this am and noticed she had numbness and tingling in the L side of her face. She is currently being treated for stage 4 breast cancer. She is A&ox4, breathing easily, grips= bilateral, no arm drift or facial droop

## 2014-02-16 NOTE — Discharge Instructions (Signed)
Return here as needed. Follow up with your doctors.

## 2014-02-16 NOTE — ED Provider Notes (Signed)
CSN: [id number]     Arrival date & time 02/16/14  1252 History   First MD Initiated Contact with Patient 02/16/14 1510     Chief Complaint  Patient presents with  . Numbness     (Consider location/radiation/quality/duration/timing/severity/associated sxs/prior Treatment) HPI Patient presents to the emergency department with patient was noticing that she had some tingling to the left side of her face and thought maybe there is mild swelling noted.  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  The patient, states, that she is not having chest pain, shortness of breath, nausea, vomiting, diarrhea, headache, blurred vision, weakness,, dizziness, back pain, neck pain, fever, cough, abdominal pain, or syncope.  The patient, states she did not take any medications prior to arrival.  Patient did not notice any rash or tongue swelling.  Patient, states she did not notice any aggravating factors Past Medical History  Diagnosis Date  . Hypertension   . Allergy   . GERD (gastroesophageal reflux disease)   . Thyroid disease   . Anemia     resolved 2011  . Hyperlipidemia     controlled  . History of blood transfusion 2009    [facility] -  UNKNOWN NUMBER OF UNITS TRANSFUSED  . Breast cancer 10/2009, 2014    ER+/PR+/Her2-   Past Surgical History  Procedure Laterality Date  . Cesarean section    . Wisdom tooth extraction    . Tubal ligation    . Breast surgery  10/2009    right lymp nodes removed  . Left foot surgery    . Abdominal hysterectomy  11/25/2012    Procedure: HYSTERECTOMY ABDOMINAL;  Surgeon: [name], MD;  Location: [facility];  Service: Gynecology;  Laterality: N/A;  with Bilateral Salpingoopherectomy and Cystoscopy  . Mastectomy complete / simple Bilateral 05/25/2013  . Mass biopsy  05/25/2013    on abdomen and right chest wall, and Right neck Archie Endo 05/25/2013  . Total mastectomy Bilateral 05/25/2013    Dr [name]   . Total mastectomy Bilateral 05/25/2013    Procedure: Bilateral Total Mastectomy;  Surgeon: Merrie Roof, MD;  Location: [facility];  Service: General;  Laterality: Bilateral;  . Mass biopsy N/A 05/25/2013    Procedure: Biopsy  nodule on abdomen and right chest wall, and Right neck;  Surgeon: Merrie Roof, MD;  Location: [facility];  Service: General;  Laterality: N/A;   Family History  Problem Relation Age of Onset  . Diabetes Mother   . Hypertension Mother   . Diabetes Maternal Aunt   . Heart disease Maternal Aunt   . Hypertension Maternal Aunt   . Stroke Maternal Aunt   . Diabetes Maternal Grandmother   . Heart disease Maternal Grandmother   . Hypertension Maternal Grandmother   . Stroke Maternal Grandmother   . Diabetes Maternal Aunt   . Hypertension Maternal Aunt    History  Substance Use Topics  . Smoking status: Current Some Day Smoker -- 0.50 packs/day for 23 years    Types: Cigarettes    Last Attempt to Quit: 11/25/2012  . Smokeless tobacco: Never Used  . Alcohol Use: Yes     Comment: social   OB History   Grav Para Term Preterm Abortions TAB SAB Ect Mult Living   _0 Review of Systems  All other systems negative except as documented  in the HPI. All pertinent positives and negatives as reviewed in the HPI.  Allergies  Metronidazole and Tramadol  Home Medications   Prior to Admission medications   Medication Sig Start Date End Date Taking? Authorizing Provider  amLODipine (NORVASC) 10 MG tablet Take 1 tablet (10 mg total) by mouth daily. 01/09/14  Yes Reyne Dumas, MD  calcium carbonate (TUMS - DOSED IN MG ELEMENTAL CALCIUM) 500 MG chewable tablet Chew 1 tablet by mouth daily as needed for indigestion.  03/29/13  Yes Chauncey Cruel, MD  cloNIDine (CATAPRES) 0.2 MG tablet Take 1 tablet (0.2 mg total) by mouth daily. 01/09/14  Yes Reyne Dumas, MD  docusate sodium (COLACE) 100 MG capsule Take 100 mg by mouth 2 (two) times daily as needed for  mild constipation.   Yes Historical Provider, MD  Fulvestrant (FASLODEX IM) Inject 500 mg into the muscle every 28 (twenty-eight) days. 06/10/13  Yes Chauncey Cruel, MD  HYDROCODONE BITARTRATE ER PO Take 1 tablet by mouth every 5 (five) hours as needed (pain).   Yes Historical Provider, MD  hydrocortisone (ANUSOL-HC) 25 MG suppository Place 25 mg rectally 2 (two) times daily as needed for hemorrhoids or itching.   Yes Historical Provider, MD  LORazepam (ATIVAN) 0.5 MG tablet Take 1 tablet (0.5 mg total) by mouth every 8 (eight) hours as needed for anxiety (or insomnia). 01/09/14  Yes Reyne Dumas, MD  losartan (COZAAR) 100 MG tablet Take 1 tablet (100 mg total) by mouth daily. 01/09/14  Yes Reyne Dumas, MD  oxyCODONE (OXY IR/ROXICODONE) 5 MG immediate release tablet Take 1 tablet (5 mg total) by mouth every 4 (four) hours as needed for severe pain. 02/03/14  Yes Chauncey Cruel, MD  polyethylene glycol (MIRALAX / GLYCOLAX) packet Take 17 g by mouth daily as needed for mild constipation.   Yes Historical Provider, MD  Zoledronic Acid (ZOMETA IV) Inject 4 mg into the vein every 30 (thirty) days.  03/17/13  Yes Chauncey Cruel, MD  levothyroxine (LEVOTHROID) 25 MCG tablet Take 1 tablet (25 mcg total) by mouth daily before breakfast. 02/03/14   Chauncey Cruel, MD  ondansetron (ZOFRAN) 8 MG tablet Take 1 tablet (8 mg total) by mouth 2 (two) times daily. Start the day after chemo for 2 days. Then take as needed for nausea or vomiting. 02/08/14   Chauncey Cruel, MD  prochlorperazine (COMPAZINE) 10 MG tablet Take 1 tablet (10 mg total) by mouth every 6 (six) hours as needed (Nausea or vomiting). 02/08/14   Chauncey Cruel, MD   BP 122/83  Pulse 77  Temp(Src) 99.2 F (37.3 C) (Oral)  Resp 16  Ht _0  (1.575 m)  Wt 150 lb 12.8 oz (68.402 kg)  BMI 27.57 kg/m2  SpO2 100%  LMP 11/12/2012 Physical Exam  Nursing note and vitals reviewed. Constitutional: She is oriented to person, place, and time. She  appears well-developed and well-nourished. No distress.  HENT:  Head: Normocephalic and atraumatic.  Right Ear: Tympanic membrane normal.  Left Ear: Tympanic membrane normal.  Nose: Nose normal.  Mouth/Throat: Uvula is midline and oropharynx is clear and moist.  Eyes: Pupils are equal, round, and reactive to light.  Neck: Normal range of motion. Neck supple.  Cardiovascular: Normal rate, regular rhythm and normal heart sounds.  Exam reveals no gallop and no friction rub.   No murmur heard. Pulmonary/Chest: Effort normal and breath sounds normal. No respiratory distress.  Abdominal: Soft. Bowel sounds are normal. She exhibits no distension.  Musculoskeletal: She exhibits no edema.  Neurological: She is alert and oriented to person, place, and time. She has normal strength and normal reflexes. No sensory deficit. She exhibits normal muscle tone. She displays a negative Romberg sign. Coordination and gait normal. GCS eye subscore is 4. GCS verbal subscore is 5. GCS motor subscore is 6.  Skin: Skin is warm and dry.    ED Course  Procedures (including critical care time) Labs Review Labs Reviewed  COMPREHENSIVE METABOLIC PANEL - Abnormal; Notable for the following:    Potassium 3.6 (*)    Alkaline Phosphatase 198 (*)    All other components within normal limits  URINALYSIS, ROUTINE W REFLEX MICROSCOPIC - Abnormal; Notable for the following:    Hgb urine dipstick SMALL (*)    All other components within normal limits  CBG MONITORING, ED - Abnormal; Notable for the following:    Glucose-Capillary 103 (*)    All other components within normal limits  CBC WITH DIFFERENTIAL  SEDIMENTATION RATE  URINE MICROSCOPIC-ADD ON    Imaging Review Ct Head Wo Contrast  02/16/2014   CLINICAL DATA:  Breast cancer.  Left facial tingling.  EXAM: CT HEAD WITHOUT CONTRAST  TECHNIQUE: Contiguous axial images were obtained from the base of the skull through the vertex without intravenous contrast.   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 No mass effect, midline shift, or acute intracranial hemorrhage. Intact cranium. Mastoid air cells are clear. Diffuse osseous metastatic disease.  IMPRESSION: No acute intracranial pathology. Chronic ischemic changes are noted. Diffuse osseous metastatic disease.   Electronically Signed   By: Maryclare Bean M.D.   On: 02/16/2014 17:45   US Soft Tissue Head/neck  02/15/2014   CLINICAL DATA:  Goiter. Previous FNA biopsy of dominant right nodule 09/04/2009.  EXAM: THYROID ULTRASOUND  TECHNIQUE: Ultrasound examination of the thyroid gland and adjacent soft tissues was performed.  COMPARISON:  09/28/2013  FINDINGS: Right thyroid lobe  Measurements: 74 x 27 x 26 mm. Mildly inhomogeneous background echotexture. 15 x 7 x 7 mm hypoechoic solid nodule, superior pole (previously 13 x 8 x 8). 23 x 23 x 24 mm solid nodule, inferior pole (previously 43 x 27 x 29).  Left thyroid lobe  Measurements: 68 x 21 x 25 mm. 10 x 7 x 8 mm solid nodule, mid lobe. 7 x 4 x 5 mm complex nodule, inferior pole. Adjacent 5 x 4 x 7 mm solid nodule.  Isthmus  18 x 10 x 16 mm solid nodule centered just to the left of midline (previously 21 x 10 x 15).  Lymphadenopathy  None visualized.  IMPRESSION: 1. Thyromegaly with multiple nodules as above. Correlate with previous biopsy results of the right dominant lesion. The left isthmic lesion meets consensus criteria for biopsy. Ultrasound-guided fine needle aspiration should be considered, as per the consensus statement: Management of Thyroid Nodules Detected at [place]: Society of Radiologists in [place]. Radiology 2005; [id number].   Electronically Signed   By: Arne Cleveland M.D.   On: 02/15/2014 16:03      Patient has been evaluated here in the emergency department and there is no significant findings noted on her CT  scan that would cause any swelling to her face.  On my exam.  There is no swelling noted.  The patient has normal sensation and cranial nerves are intact.  The trigeminal nerve appears to be  working and normal function on both sides of her face.  Patient has a history of left eyelid ptosis.  She is referred back to her primary care doctor in cancer specialist.  Told to return here as needed  Brent General, PA-C 02/19/14 1615

## 2014-02-20 ENCOUNTER — Other Ambulatory Visit: Payer: Self-pay | Admitting: *Deleted

## 2014-02-20 DIAGNOSIS — C7989 Secondary malignant neoplasm of other specified sites: Principal | ICD-10-CM

## 2014-02-20 DIAGNOSIS — C50919 Malignant neoplasm of unspecified site of unspecified female breast: Secondary | ICD-10-CM

## 2014-02-21 ENCOUNTER — Ambulatory Visit (HOSPITAL_BASED_OUTPATIENT_CLINIC_OR_DEPARTMENT_OTHER): Payer: Medicaid Other

## 2014-02-21 ENCOUNTER — Encounter: Payer: Self-pay | Admitting: Physician Assistant

## 2014-02-21 ENCOUNTER — Ambulatory Visit (HOSPITAL_BASED_OUTPATIENT_CLINIC_OR_DEPARTMENT_OTHER): Payer: Medicaid Other | Admitting: Physician Assistant

## 2014-02-21 ENCOUNTER — Other Ambulatory Visit (HOSPITAL_BASED_OUTPATIENT_CLINIC_OR_DEPARTMENT_OTHER): Payer: Medicaid Other

## 2014-02-21 ENCOUNTER — Encounter: Payer: Self-pay | Admitting: Oncology

## 2014-02-21 ENCOUNTER — Telehealth: Payer: Self-pay | Admitting: Oncology

## 2014-02-21 VITALS — BP 125/87 | HR 86 | Temp 98.7°F | Resp 18 | Ht 62.0 in | Wt 148.3 lb

## 2014-02-21 DIAGNOSIS — C773 Secondary and unspecified malignant neoplasm of axilla and upper limb lymph nodes: Secondary | ICD-10-CM

## 2014-02-21 DIAGNOSIS — R61 Generalized hyperhidrosis: Secondary | ICD-10-CM

## 2014-02-21 DIAGNOSIS — E049 Nontoxic goiter, unspecified: Secondary | ICD-10-CM

## 2014-02-21 DIAGNOSIS — F3289 Other specified depressive episodes: Secondary | ICD-10-CM

## 2014-02-21 DIAGNOSIS — C50919 Malignant neoplasm of unspecified site of unspecified female breast: Secondary | ICD-10-CM

## 2014-02-21 DIAGNOSIS — C50411 Malignant neoplasm of upper-outer quadrant of right female breast: Secondary | ICD-10-CM

## 2014-02-21 DIAGNOSIS — C50419 Malignant neoplasm of upper-outer quadrant of unspecified female breast: Secondary | ICD-10-CM

## 2014-02-21 DIAGNOSIS — C7982 Secondary malignant neoplasm of genital organs: Secondary | ICD-10-CM

## 2014-02-21 DIAGNOSIS — C7952 Secondary malignant neoplasm of bone marrow: Secondary | ICD-10-CM

## 2014-02-21 DIAGNOSIS — F419 Anxiety disorder, unspecified: Secondary | ICD-10-CM

## 2014-02-21 DIAGNOSIS — C7960 Secondary malignant neoplasm of unspecified ovary: Secondary | ICD-10-CM

## 2014-02-21 DIAGNOSIS — Z5111 Encounter for antineoplastic chemotherapy: Secondary | ICD-10-CM

## 2014-02-21 DIAGNOSIS — C7989 Secondary malignant neoplasm of other specified sites: Secondary | ICD-10-CM

## 2014-02-21 DIAGNOSIS — C7951 Secondary malignant neoplasm of bone: Secondary | ICD-10-CM

## 2014-02-21 DIAGNOSIS — R1310 Dysphagia, unspecified: Secondary | ICD-10-CM

## 2014-02-21 DIAGNOSIS — C779 Secondary and unspecified malignant neoplasm of lymph node, unspecified: Secondary | ICD-10-CM

## 2014-02-21 DIAGNOSIS — F329 Major depressive disorder, single episode, unspecified: Secondary | ICD-10-CM

## 2014-02-21 DIAGNOSIS — C762 Malignant neoplasm of abdomen: Secondary | ICD-10-CM

## 2014-02-21 DIAGNOSIS — C787 Secondary malignant neoplasm of liver and intrahepatic bile duct: Secondary | ICD-10-CM

## 2014-02-21 DIAGNOSIS — C792 Secondary malignant neoplasm of skin: Secondary | ICD-10-CM

## 2014-02-21 DIAGNOSIS — L989 Disorder of the skin and subcutaneous tissue, unspecified: Secondary | ICD-10-CM

## 2014-02-21 LAB — CBC WITH DIFFERENTIAL/PLATELET
BASO%: 0.6 % (ref 0.0–2.0)
Basophils Absolute: 0 10*3/uL (ref 0.0–0.1)
EOS%: 5.1 % (ref 0.0–7.0)
Eosinophils Absolute: 0.3 10*3/uL (ref 0.0–0.5)
HCT: 37.4 % (ref 34.8–46.6)
HGB: 12.6 g/dL (ref 11.6–15.9)
LYMPH%: 38.1 % (ref 14.0–49.7)
MCH: 29 pg (ref 25.1–34.0)
MCHC: 33.7 g/dL (ref 31.5–36.0)
MCV: 86.2 fL (ref 79.5–101.0)
MONO#: 0.5 10*3/uL (ref 0.1–0.9)
MONO%: 10.7 % (ref 0.0–14.0)
NEUT#: 2.2 10*3/uL (ref 1.5–6.5)
NEUT%: 45.5 % (ref 38.4–76.8)
Platelets: 277 10*3/uL (ref 145–400)
RBC: 4.34 10*6/uL (ref 3.70–5.45)
RDW: 13.8 % (ref 11.2–14.5)
WBC: 4.9 10*3/uL (ref 3.9–10.3)
lymph#: 1.9 10*3/uL (ref 0.9–3.3)
nRBC: 0 % (ref 0–0)

## 2014-02-21 LAB — COMPREHENSIVE METABOLIC PANEL (CC13)
ALT: 31 U/L (ref 0–55)
AST: 26 U/L (ref 5–34)
Albumin: 4.4 g/dL (ref 3.5–5.0)
Alkaline Phosphatase: 202 U/L — ABNORMAL HIGH (ref 40–150)
Anion Gap: 11 mEq/L (ref 3–11)
BUN: 8.6 mg/dL (ref 7.0–26.0)
CO2: 24 mEq/L (ref 22–29)
Calcium: 10.7 mg/dL — ABNORMAL HIGH (ref 8.4–10.4)
Chloride: 107 mEq/L (ref 98–109)
Creatinine: 0.6 mg/dL (ref 0.6–1.1)
Glucose: 95 mg/dl (ref 70–140)
Potassium: 3.7 mEq/L (ref 3.5–5.1)
Sodium: 142 mEq/L (ref 136–145)
Total Bilirubin: 0.55 mg/dL (ref 0.20–1.20)
Total Protein: 7.7 g/dL (ref 6.4–8.3)

## 2014-02-21 MED ORDER — PACLITAXEL PROTEIN-BOUND CHEMO INJECTION 100 MG
100.0000 mg/m2 | Freq: Once | INTRAVENOUS | Status: AC
  Administered 2014-02-21: 175 mg via INTRAVENOUS
  Filled 2014-02-21: qty 35

## 2014-02-21 MED ORDER — ONDANSETRON 8 MG/50ML IVPB (CHCC)
8.0000 mg | Freq: Once | INTRAVENOUS | Status: AC
  Administered 2014-02-21: 8 mg via INTRAVENOUS

## 2014-02-21 MED ORDER — SODIUM CHLORIDE 0.9 % IJ SOLN
3.0000 mL | Freq: Once | INTRAMUSCULAR | Status: DC | PRN
  Filled 2014-02-21: qty 10

## 2014-02-21 MED ORDER — ONDANSETRON 8 MG/NS 50 ML IVPB
INTRAVENOUS | Status: AC
  Filled 2014-02-21: qty 8

## 2014-02-21 MED ORDER — HEPARIN SOD (PORK) LOCK FLUSH 100 UNIT/ML IV SOLN
500.0000 [IU] | Freq: Once | INTRAVENOUS | Status: DC | PRN
  Filled 2014-02-21: qty 5

## 2014-02-21 MED ORDER — DEXAMETHASONE SODIUM PHOSPHATE 10 MG/ML IJ SOLN
INTRAMUSCULAR | Status: AC
  Filled 2014-02-21: qty 1

## 2014-02-21 MED ORDER — DEXAMETHASONE SODIUM PHOSPHATE 10 MG/ML IJ SOLN
10.0000 mg | Freq: Once | INTRAMUSCULAR | Status: AC
  Administered 2014-02-21: 10 mg via INTRAVENOUS

## 2014-02-21 MED ORDER — ALTEPLASE 2 MG IJ SOLR
2.0000 mg | Freq: Once | INTRAMUSCULAR | Status: DC | PRN
  Filled 2014-02-21: qty 2

## 2014-02-21 MED ORDER — SODIUM CHLORIDE 0.9 % IJ SOLN
10.0000 mL | INTRAMUSCULAR | Status: DC | PRN
  Filled 2014-02-21: qty 10

## 2014-02-21 MED ORDER — SODIUM CHLORIDE 0.9 % IV SOLN
Freq: Once | INTRAVENOUS | Status: AC
Start: 2014-02-21 — End: 2014-02-21
  Administered 2014-02-21: 14:00:00 via INTRAVENOUS

## 2014-02-21 MED ORDER — HEPARIN SOD (PORK) LOCK FLUSH 100 UNIT/ML IV SOLN
250.0000 [IU] | Freq: Once | INTRAVENOUS | Status: DC | PRN
  Filled 2014-02-21: qty 5

## 2014-02-21 MED ORDER — ZOLEDRONIC ACID 4 MG/5ML IV CONC: 4.0000 mg | Freq: Once | INTRAVENOUS | Status: DC

## 2014-02-21 NOTE — Progress Notes (Addendum)
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 mastectomy with right axillary lymph node dissection.  Suspected multifocal hepatic metastases, new/progressed.  Small volume abdominopelvic ascites, mildly increased, with suspected peritoneal disease.  Small right hilar, upper abdominal, and left pelvic lymph nodes, as described above, indeterminate.  Diffuse osseous metastases, unchanged.   Electronically Signed   By: SJulian HyM.D.   On: 02/02/2014 12:07   Mr Liver W Wo Contrast 02/08/2014   CLINICAL DATA:  Metastatic breast cancer.  EXAM: MRI ABDOMEN WITHOUT AND WITH CONTRAST  TECHNIQUE: Multiplanar, multisequence MR imaging was performed both before and after administration of intravenous contrast.  CONTRAST:  50m MULTIHANCE GADOBENATE DIMEGLUMINE 529 MG/ML IV SOLN  COMPARISON:  Whole body bone scan and CTs of the chest, abdomen and pelvis 02/02/2014. PET-CT 09/19/2013.  FINDINGS: As demonstrated on recent CT, there is widespread hepatic metastatic disease superimposed on a background of moderate hepatic steatosis. The lesions are best seen on the T2 weighted and precontrast LAVA sequences. Measured on series 3, representative lesions include a 2.1 cm lesion in the medial segment of the left lobe (image 21), a 1.6 cm lesion anteriorly in the right hepatic lobe (image 16) and a 1.8 cm lesion inferiorly in the right hepatic lobe  (image 33). These lesions demonstrate typical peripheral continuous enhancement following contrast.  There is a small amount of ascites. Peritoneal and omental enhancement is better demonstrated on CT and remains concerning for carcinomatosis. There are stable small lymph nodes within the porta hepatis.  Cholelithiasis is noted. There is no gallbladder wall thickening or biliary dilatation. The spleen, pancreas and adrenal glands appear normal. The right kidney appears normal. The left kidney demonstrates moderate pelvicaliectasis and delayed contrast excretion, similar to recent CT.  The widespread marrow abnormalities demonstrated on CT are less apparent on MRI. There is only mildly heterogeneous marrow signal and enhancement on MRI.  IMPRESSION: 1. Multifocal hepatic metastatic disease, unchanged from recent CT. 2. Ascites and omental/peritoneal nodularity (better seen on CT), concerning for carcinomatosis. 3. Diffuse osseous metastatic disease demonstrated by CT shows only minimally heterogeneous signal and enhancement on MRI and may be treated. 4. Persistent left UPJ obstruction. 5. Cholelithiasis.   Electronically Signed   By: BCamie PatienceM.D.   On: 02/08/2014 09:19      ASSESSMENT: 48y.o. 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  On the left, only atypical ductal hyperplasia.  On the right upper outer quadrant, high-grade invasive ductal carcinoma, clincally T2 N0, stage IIA  (2)  Treated in the neoadjuvant setting with docetaxel, doxorubicin, and cyclophosphamide x6, chemotherapy completed in December 2010.    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  However three out of 10 lymph nodes were positive.  ypTis ypN1,  stage IIB. Tumor was strongly estrogen receptor/progesterone receptor positive, HER2/neu negative with a high proliferation fraction.   (4)  adjuvant radiation therapy, completed May 2011,   (5)  on tamoxifen May 2011 to January 2014 when she was found to have stage IV disease  (6) s/p TAH-BSO 11/25/2012 with metastatic brast cancer, estrogen receptor 30% and progestrerone receptor 20% positive, HER-2 negative  (7) anastrozole started February 2014, discontinued in April 2014 due to poor tolerance  (8)  patient started letrozole in mid May 2014, discontinued August 2014 with progression  (9)  zoledronic acid given every 28 days for bony metastatic disease, first dose in May 2014; changed to every 6 weeks beginning 02/28/2014 2 better coordinate with her Abraxane treatments  (10) skin involvement over the left breast and possibly other distant skin sites noted June 2014, with   (a) biopsy of the left breast and a left axillary node 04/29/2013  confirming invasive ductal breast cancer with lobular features, grade 1, estrogen receptor 99% positive, progesterone receptor 55%  positive, with an MIB-1 of 17% and no HER-2 amplification  (b) biopsy of the subareolar region of the right breast also shows an invasive ductal carcinoma with lobular features, 92% estrogen receptor positive, 32% progesterone receptor positive, with an MIB-1 of 19% and no HER-2 amplification.  (11) status post bilateral mastectomies 05/25/2013, showing:  (a) on the left, mypT1c NX invasive mammary carcinoma, with ductal and lobular features, grade 1, repeat HER-2 again negative  (b) on the right, yp T2 NX invasive lobular breast cancer, grade 1, with negative margins, and HER-2 again negative  (12) right chest wall skin biopsy and right neck biopsy both positive for metastatic breast cancer  (13) fulvestrant at 500 mg monthly started 06/10/2013, last dose 01/17/2014, with progression  (14)  Hot flashes, improving on  clonidine at bedtime  (15)  Depression, declines oral anti-depreesants  (16)  Skin lesion, right arm  (17)  thyroid nodules - pending biopsy   (18) Abraxane, first dose 02/21/2014, to be given day 1 and day 8 of every 21 day cycle  (19)  Dysphagia - swallowing study pending  PLAN: Prior to Dr. Virgie Dad visit with this patient today, I actually spent approximately one hour counseling her with regards to her multiple questions and concerns. She is concerned about the recent facial numbness and swelling she experienced last week, and none of the recent scans showed a clear reason for this to have happened. Since it did improve when taking Benadryl, I wonder if this is some type of allergic reaction. She will let us know if this occurs again.   We reviewed her recent ultrasound of the neck and thyroid, and she would like to proceed with an additional biopsy of the stomach lesion as recommended, and I will try to get that scheduled for her accordingly. While her thyromegaly could certainly be contributing to her dysphagia, also wonder if she could have some esophageal spasms going on, and I'm also recommending a swallowing study be scheduled.   Rekha will receive her first dose of Abraxane as scheduled today, but will be treated peripherally since she has not yet had her port placed. We will try to coordinate port placement through interventional radiology sometime over the next week prior to her next scheduled infusion which is April 28.   The overall plan will be for a Abraxane days 1 and 8 of every 21 day cycle. We will do 4 cycles and then obtain a restaging MRI. If she tolerates it well and is working we can continue.  Briceyda and her sister both voice understanding and agreement with the above plan.  Dr. [name] also spoke extensively with the patient and her sister today as is documented separately.    Theotis Burrow, PA-C 04/21/215   ADDENDUM: Makenlee had multiple questions arising  from her second opinion where she received some information that was confusing to her. We discussed the newer categorization of breast cancer intraluminal A. and B. as well as HER-2 positive and triple negative, and she understands that while this is of interest it does not affect her treatment plan. We went over again the possible toxicities, side effects and complications of Abraxane. We'll to review the fact that the goal of treatment in her case is control.  I think after our discussions Nozomi felt a bit more reassured and willing to proceed with treatment as planned. She knows to call us for any problems that may develop before her next visit here.   I personally saw  this patient and performed a substantive portion of this encounter with the listed APP documented above.   Chauncey Cruel, MD

## 2014-02-21 NOTE — Patient Instructions (Signed)
[facility] Discharge Instructions for Patients Receiving Chemotherapy  Today you received the following chemotherapy agents: Abraxane  To help prevent nausea and vomiting after your treatment, we encourage you to take your nausea medication as prescribed.   If you develop nausea and vomiting that is not controlled by your nausea medication, call the clinic.   BELOW ARE SYMPTOMS THAT SHOULD BE REPORTED IMMEDIATELY:  *FEVER GREATER THAN 100.5 F  *CHILLS WITH OR WITHOUT FEVER  NAUSEA AND VOMITING THAT IS NOT CONTROLLED WITH YOUR NAUSEA MEDICATION  *UNUSUAL SHORTNESS OF BREATH  *UNUSUAL BRUISING OR BLEEDING  TENDERNESS IN MOUTH AND THROAT WITH OR WITHOUT PRESENCE OF ULCERS  *URINARY PROBLEMS  *BOWEL PROBLEMS  UNUSUAL RASH Items with * indicate a potential emergency and should be followed up as soon as possible.  Feel free to call the clinic you have any questions or concerns. The clinic phone number is [phone].   Nanoparticle Albumin-Bound Paclitaxel injection - Abraxane What is this medicine? NANOPARTICLE ALBUMIN-BOUND PACLITAXEL (Na no PAHR ti kuhl al BYOO muhn-bound PAK li TAX el) is a chemotherapy drug. It targets fast dividing cells, like cancer cells, and causes these cells to die. This medicine is used to treat advanced breast cancer and advanced lung cancer. This medicine may be used for other purposes; ask your health care provider or pharmacist if you have questions. COMMON BRAND NAME(S): Abraxane What should I tell my health care provider before I take this medicine? They need to know if you have any of these conditions: -kidney disease -liver disease -low blood counts, like low platelets, red blood cells, or white blood cells -recent or ongoing radiation therapy -an unusual or allergic reaction to paclitaxel, albumin, other chemotherapy, other medicines, foods, dyes, or preservatives -pregnant or trying to get pregnant -breast-feeding How  should I use this medicine? This drug is given as an infusion into a vein. It is administered in a hospital or clinic by a specially trained health care professional. Talk to your pediatrician regarding the use of this medicine in children. Special care may be needed. Overdosage: If you think you have taken too much of this medicine contact a poison control center or emergency room at once. NOTE: This medicine is only for you. Do not share this medicine with others. What if I miss a dose? It is important not to miss your dose. Call your doctor or health care professional if you are unable to keep an appointment. What may interact with this medicine? -cyclosporine -diazepam -ketoconazole -medicines to increase blood counts like filgrastim, pegfilgrastim, sargramostim -other chemotherapy drugs like cisplatin, doxorubicin, epirubicin, etoposide, teniposide, vincristine -quinidine -testosterone -vaccines -verapamil Talk to your doctor or health care professional before taking any of these medicines: -acetaminophen -aspirin -ibuprofen -ketoprofen -naproxen This list may not describe all possible interactions. Give your health care provider a list of all the medicines, herbs, non-prescription drugs, or dietary supplements you use. Also tell them if you smoke, drink alcohol, or use illegal drugs. Some items may interact with your medicine. What should I watch for while using this medicine? Your condition will be monitored carefully while you are receiving this medicine. You will need important blood work done while you are taking this medicine. This drug may make you feel generally unwell. This is not uncommon, as chemotherapy can affect healthy cells as well as cancer cells. Report any side effects. Continue your course of treatment even though you feel ill unless your doctor tells you to stop. In some   cases, you may be given additional medicines to help with side effects. Follow all directions  for their use. Call your doctor or health care professional for advice if you get a fever, chills or sore throat, or other symptoms of a cold or flu. Do not treat yourself. This drug decreases your body's ability to fight infections. Try to avoid being around people who are sick. This medicine may increase your risk to bruise or bleed. Call your doctor or health care professional if you notice any unusual bleeding. Be careful brushing and flossing your teeth or using a toothpick because you may get an infection or bleed more easily. If you have any dental work done, tell your dentist you are receiving this medicine. Avoid taking products that contain aspirin, acetaminophen, ibuprofen, naproxen, or ketoprofen unless instructed by your doctor. These medicines may hide a fever. Do not become pregnant while taking this medicine. Women should inform their doctor if they wish to become pregnant or think they might be pregnant. There is a potential for serious side effects to an unborn child. Talk to your health care professional or pharmacist for more information. Do not breast-feed an infant while taking this medicine. Men are advised not to father a child while receiving this medicine. What side effects may I notice from receiving this medicine? Side effects that you should report to your doctor or health care professional as soon as possible: -allergic reactions like skin rash, itching or hives, swelling of the face, lips, or tongue -low blood counts - This drug may decrease the number of white blood cells, red blood cells and platelets. You may be at increased risk for infections and bleeding. -signs of infection - fever or chills, cough, sore throat, pain or difficulty passing urine -signs of decreased platelets or bleeding - bruising, pinpoint red spots on the skin, black, tarry stools, nosebleeds -signs of decreased red blood cells - unusually weak or tired, fainting spells, lightheadedness -breathing  problems -changes in vision -chest pain -high or low blood pressure -mouth sores -nausea and vomiting -pain, swelling, redness or irritation at the injection site -pain, tingling, numbness in the hands or feet -slow or irregular heartbeat -swelling of the ankle, feet, hands Side effects that usually do not require medical attention (report to your doctor or health care professional if they continue or are bothersome): -aches, pains -changes in the color of fingernails -diarrhea -hair loss -loss of appetite This list may not describe all possible side effects. Call your doctor for medical advice about side effects. You may report side effects to FDA at 1-800-FDA-1088. Where should I keep my medicine? This drug is given in a hospital or clinic and will not be stored at home. NOTE: This sheet is a summary. It may not cover all possible information. If you have questions about this medicine, talk to your doctor, pharmacist, or health care provider.  2014, [employer]vier/[employer] Standard. (2012-12-13 16:48:50)  

## 2014-02-21 NOTE — Telephone Encounter (Signed)
gv pt appt schedule for april/may including port appt. per pt IR has already called re port. central will call re swallowing study and bx.

## 2014-02-21 NOTE — Progress Notes (Signed)
Ok to treat with CMET from 02/16/2014 per Dr. [name].

## 2014-02-22 ENCOUNTER — Other Ambulatory Visit: Payer: Self-pay | Admitting: *Deleted

## 2014-02-22 ENCOUNTER — Telehealth: Payer: Self-pay | Admitting: *Deleted

## 2014-02-22 ENCOUNTER — Other Ambulatory Visit: Payer: Self-pay | Admitting: Radiology

## 2014-02-22 ENCOUNTER — Encounter (HOSPITAL_COMMUNITY): Payer: Self-pay | Admitting: Pharmacy Technician

## 2014-02-22 DIAGNOSIS — C7989 Secondary malignant neoplasm of other specified sites: Principal | ICD-10-CM

## 2014-02-22 DIAGNOSIS — C50919 Malignant neoplasm of unspecified site of unspecified female breast: Secondary | ICD-10-CM

## 2014-02-22 MED ORDER — LIDOCAINE-PRILOCAINE 2.5-2.5 % EX CREA: 1.0000 "application " | TOPICAL_CREAM | CUTANEOUS | Status: AC | PRN

## 2014-02-22 NOTE — Telephone Encounter (Signed)
Spoke with pt today for post chemo follow up call.  Pt stated she slept well last night.  Good appetite and drinking lots of fluids as tolerated.  Had some nausea this am which pt took antiemetics with relief, but no vomiting.  Bowel and bladder function fine.  Denied pain.  Pt has portacath insertion  02/23/14.  Prescription for Emla cream called in to [facility] as ok per Micah Flesher, PA. Pt stated she had good experience with new treatment yesterday.  Pt had chemo in the past.  Stated staff were very nice, provided pt with explanations throughout her treatment.  No other concerns at this time.

## 2014-02-23 ENCOUNTER — Ambulatory Visit (HOSPITAL_COMMUNITY)
Admission: RE | Admit: 2014-02-23 | Discharge: 2014-02-23 | Disposition: A | Discharge status: Home / Self Care | Payer: Medicaid Other | Source: Ambulatory Visit | Attending: Oncology | Admitting: Oncology

## 2014-02-23 ENCOUNTER — Encounter (HOSPITAL_COMMUNITY): Payer: Self-pay

## 2014-02-23 ENCOUNTER — Other Ambulatory Visit: Payer: Self-pay | Admitting: Physician Assistant

## 2014-02-23 ENCOUNTER — Ambulatory Visit (HOSPITAL_COMMUNITY)
Admission: RE | Admit: 2014-02-23 | Discharge: 2014-02-23 | Disposition: A | Discharge status: Home / Self Care | Payer: Medicaid Other | Source: Ambulatory Visit | Attending: Physician Assistant | Admitting: Physician Assistant

## 2014-02-23 DIAGNOSIS — C50919 Malignant neoplasm of unspecified site of unspecified female breast: Secondary | ICD-10-CM

## 2014-02-23 DIAGNOSIS — I10 Essential (primary) hypertension: Secondary | ICD-10-CM | POA: Insufficient documentation

## 2014-02-23 DIAGNOSIS — Z90710 Acquired absence of both cervix and uterus: Secondary | ICD-10-CM | POA: Insufficient documentation

## 2014-02-23 DIAGNOSIS — C762 Malignant neoplasm of abdomen: Secondary | ICD-10-CM

## 2014-02-23 DIAGNOSIS — C50411 Malignant neoplasm of upper-outer quadrant of right female breast: Secondary | ICD-10-CM

## 2014-02-23 DIAGNOSIS — Z170 Estrogen receptor positive status [ER+]: Secondary | ICD-10-CM | POA: Insufficient documentation

## 2014-02-23 DIAGNOSIS — Z9010 Acquired absence of unspecified breast and nipple: Secondary | ICD-10-CM | POA: Insufficient documentation

## 2014-02-23 DIAGNOSIS — Z79899 Other long term (current) drug therapy: Secondary | ICD-10-CM | POA: Insufficient documentation

## 2014-02-23 DIAGNOSIS — C50419 Malignant neoplasm of upper-outer quadrant of unspecified female breast: Secondary | ICD-10-CM | POA: Insufficient documentation

## 2014-02-23 DIAGNOSIS — E785 Hyperlipidemia, unspecified: Secondary | ICD-10-CM | POA: Insufficient documentation

## 2014-02-23 DIAGNOSIS — F17200 Nicotine dependence, unspecified, uncomplicated: Secondary | ICD-10-CM | POA: Insufficient documentation

## 2014-02-23 DIAGNOSIS — C787 Secondary malignant neoplasm of liver and intrahepatic bile duct: Secondary | ICD-10-CM | POA: Insufficient documentation

## 2014-02-23 DIAGNOSIS — C7951 Secondary malignant neoplasm of bone: Secondary | ICD-10-CM

## 2014-02-23 DIAGNOSIS — K219 Gastro-esophageal reflux disease without esophagitis: Secondary | ICD-10-CM | POA: Insufficient documentation

## 2014-02-23 DIAGNOSIS — C7989 Secondary malignant neoplasm of other specified sites: Principal | ICD-10-CM

## 2014-02-23 LAB — PROTIME-INR
INR: 0.96 (ref 0.00–1.49)
Prothrombin Time: 12.6 seconds (ref 11.6–15.2)

## 2014-02-23 LAB — APTT: aPTT: 31 seconds (ref 24–37)

## 2014-02-23 MED ORDER — SODIUM CHLORIDE 0.9 % IV SOLN
INTRAVENOUS | Status: DC
  Administered 2014-02-23: 12:00:00 via INTRAVENOUS

## 2014-02-23 MED ORDER — CEFAZOLIN SODIUM-DEXTROSE 2-3 GM-% IV SOLR
2.0000 g | INTRAVENOUS | Status: AC
  Administered 2014-02-23: 2 g via INTRAVENOUS
  Filled 2014-02-23: qty 50

## 2014-02-23 MED ORDER — FENTANYL CITRATE 0.05 MG/ML IJ SOLN
INTRAMUSCULAR | Status: AC | PRN
  Administered 2014-02-23 (×4): 25 ug via INTRAVENOUS

## 2014-02-23 MED ORDER — LIDOCAINE-EPINEPHRINE (PF) 2 %-1:200000 IJ SOLN
INTRAMUSCULAR | Status: AC
  Filled 2014-02-23: qty 20

## 2014-02-23 MED ORDER — FENTANYL CITRATE 0.05 MG/ML IJ SOLN
INTRAMUSCULAR | Status: AC
  Filled 2014-02-23: qty 6

## 2014-02-23 MED ORDER — HEPARIN SOD (PORK) LOCK FLUSH 100 UNIT/ML IV SOLN
INTRAVENOUS | Status: AC
  Filled 2014-02-23: qty 5

## 2014-02-23 MED ORDER — HEPARIN SOD (PORK) LOCK FLUSH 100 UNIT/ML IV SOLN
INTRAVENOUS | Status: AC | PRN
  Administered 2014-02-23: 500 [IU]

## 2014-02-23 MED ORDER — MIDAZOLAM HCL 2 MG/2ML IJ SOLN
INTRAMUSCULAR | Status: AC
  Filled 2014-02-23: qty 6

## 2014-02-23 MED ORDER — MIDAZOLAM HCL 2 MG/2ML IJ SOLN
INTRAMUSCULAR | Status: AC | PRN
  Administered 2014-02-23: 1 mg via INTRAVENOUS
  Administered 2014-02-23 (×2): 0.5 mg via INTRAVENOUS
  Administered 2014-02-23: 1 mg via INTRAVENOUS
  Administered 2014-02-23 (×2): 0.5 mg via INTRAVENOUS

## 2014-02-23 MED ORDER — LIDOCAINE HCL 1 % IJ SOLN
INTRAMUSCULAR | Status: AC
  Filled 2014-02-23: qty 20

## 2014-02-23 NOTE — Procedures (Signed)
Placement of right jugular portacath. Tip at SVC/RA junction and ready to use.  No immediate complication.

## 2014-02-23 NOTE — Discharge Instructions (Signed)
Implanted Port Home Guide °An implanted port is a type of central line that is placed under the skin. Central lines are used to provide IV access when treatment or nutrition needs to be given through a person's veins. Implanted ports are used for long-term IV access. An implanted port may be placed because:  °· You need IV medicine that would be irritating to the small veins in your hands or arms.   °· You need long-term IV medicines, such as antibiotics.   °· You need IV nutrition for a long period.   °· You need frequent blood draws for lab tests.   °· You need dialysis.   °Implanted ports are usually placed in the chest area, but they can also be placed in the upper arm, the abdomen, or the leg. An implanted port has two main parts:  °· Reservoir. The reservoir is round and will appear as a small, raised area under your skin. The reservoir is the part where a needle is inserted to give medicines or draw blood.   °· Catheter. The catheter is a thin, flexible tube that extends from the reservoir. The catheter is placed into a large vein. Medicine that is inserted into the reservoir goes into the catheter and then into the vein.   °HOW WILL I CARE FOR MY INCISION SITE? °Do not get the incision site wet. Bathe or shower as directed by your health care provider.  °HOW IS MY PORT ACCESSED? °Special steps must be taken to access the port:  °· Before the port is accessed, a numbing cream can be placed on the skin. This helps numb the skin over the port site.   °· Your health care provider uses a sterile technique to access the port. °· Your health care provider must put on a mask and sterile gloves. °· The skin over your port is cleaned carefully with an antiseptic and allowed to dry. °· The port is gently pinched between sterile gloves, and a needle is inserted into the port. °· Only "non-coring" port needles should be used to access the port. Once the port is accessed, a blood return should be checked. This helps  ensure that the port is in the vein and is not clogged.   °· If your port needs to remain accessed for a constant infusion, a clear (transparent) bandage will be placed over the needle site. The bandage and needle will need to be changed every week, or as directed by your health care provider.   °· Keep the bandage covering the needle clean and dry. Do not get it wet. Follow your health care provider's instructions on how to take a shower or bath while the port is accessed.   °· If your port does not need to stay accessed, no bandage is needed over the port.   °WHAT IS FLUSHING? °Flushing helps keep the port from getting clogged. Follow your health care provider's instructions on how and when to flush the port. Ports are usually flushed with saline solution or a medicine called heparin. The need for flushing will depend on how the port is used.  °· If the port is used for intermittent medicines or blood draws, the port will need to be flushed:   °· After medicines have been given.   °· After blood has been drawn.   °· As part of routine maintenance.   °· If a constant infusion is running, the port may not need to be flushed.   °HOW LONG WILL MY PORT STAY IMPLANTED? °The port can stay in for as long as your health care   provider thinks it is needed. When it is time for the port to come out, surgery will be done to remove it. The procedure is similar to the one performed when the port was put in.  °WHEN SHOULD I SEEK IMMEDIATE MEDICAL CARE? °When you have an implanted port, you should seek immediate medical care if:  °· You notice a bad smell coming from the incision site.   °· You have swelling, redness, or drainage at the incision site.   °· You have more swelling or pain at the port site or the surrounding area.   °· You have a fever that is not controlled with medicine. °Document Released: 10/20/2005 Document Revised: 08/10/2013 Document Reviewed: 06/27/2013 °ExitCare® Patient Information ©2014 [employer]. °Moderate Sedation, Adult °Moderate sedation is given to help you relax or even sleep through a procedure. You may remain sleepy, be clumsy, or have poor balance for several hours following this procedure. Arrange for a responsible adult, family member, or friend to take you home. A responsible adult should stay with you for at least 24 hours or until the medicines have worn off. °· Do not participate in any activities where you could become injured for the next 24 hours, or until you feel normal again. Do not: °· Drive. °· Swim. °· Ride a bicycle. °· Operate heavy machinery. °· Cook. °· Use power tools. °· Climb ladders. °· Work at heights. °· Do not make important decisions or sign legal documents until you are improved. °· Vomiting may occur if you eat too soon. When you can drink without vomiting, try water, juice, or soup. Try solid foods if you feel little or no nausea. °· Only take over-the-counter or prescription medications for pain, discomfort, or fever as directed by your caregiver.If pain medications have been prescribed for you, ask your caregiver how soon it is safe to take them. °· Make sure you and your family fully understands everything about the medication given to you. Make sure you understand what side effects may occur. °· You should not drink alcohol, take sleeping pills, or medications that cause drowsiness for at least 24 hours. °· If you smoke, do not smoke alone. °· If you are feeling better, you may resume normal activities 24 hours after receiving sedation. °· Keep all appointments as scheduled. Follow all instructions. °· Ask questions if you do not understand. °SEEK MEDICAL CARE IF:  °· Your skin is pale or bluish in color. °· You continue to feel sick to your stomach (nauseous) or throw up (vomit). °· Your pain is getting worse and not helped by medication. °· You have bleeding or swelling. °· You are still sleepy or feeling clumsy after 24 hours. °SEEK IMMEDIATE MEDICAL CARE IF:   °· You develop a rash. °· You have difficulty breathing. °· You develop any type of allergic problem. °· You have a fever. °Document Released: 07/15/2001 Document Revised: 01/12/2012 Document Reviewed: 06/27/2013 °ExitCare® Patient Information ©2014 [employer]. ° °

## 2014-02-23 NOTE — H&P (Signed)
Tammy Blanchard is an 48 y.o. female.   Chief Complaint: Hx breast cancer now with liver metastasis B mastectomy 05/2013 Pt has started chemo therapy and needs PAC for continued treatment Has had PAC once before without issue  HPI: HTN; GERD; HLD; Breast Ca- liver mets; B mastectomy  Past Medical History  Diagnosis Date  . Hypertension   . Allergy   . GERD (gastroesophageal reflux disease)   . Thyroid disease   . Anemia     resolved 2011  . Hyperlipidemia     controlled  . History of blood transfusion 2009    [facility] -  UNKNOWN NUMBER OF UNITS TRANSFUSED  . 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 Cesarean section    . Wisdom tooth extraction    . Tubal ligation    . Breast surgery  10/2009    right lymp nodes removed  . Left foot surgery    . Abdominal hysterectomy  11/25/2012    Procedure: HYSTERECTOMY ABDOMINAL;  Surgeon: [name], MD;  Location: [facility];  Service: Gynecology;  Laterality: N/A;  with Bilateral Salpingoopherectomy and Cystoscopy  . Mastectomy complete / simple Bilateral 05/25/2013  . Mass biopsy  05/25/2013    on abdomen and right chest wall, and Right neck Archie Endo 05/25/2013  . Total mastectomy Bilateral 05/25/2013    Dr [name]   . Total mastectomy Bilateral 05/25/2013    Procedure: Bilateral Total Mastectomy;  Surgeon: Merrie Roof, MD;  Location: [facility];  Service: General;  Laterality: Bilateral;  . Mass biopsy N/A 05/25/2013    Procedure: Biopsy  nodule on abdomen and right chest wall, and Right neck;  Surgeon: Merrie Roof, MD;  Location: [facility];  Service: General;  Laterality: N/A;    Family History  Problem Relation Age of Onset  . Diabetes Mother   . Hypertension Mother   . Diabetes Maternal Aunt   . Heart disease Maternal Aunt   . Hypertension Maternal Aunt   . Stroke Maternal Aunt   . Diabetes Maternal Grandmother   . Heart disease Maternal Grandmother   . Hypertension  Maternal Grandmother   . Stroke Maternal Grandmother   . Diabetes Maternal Aunt   . Hypertension Maternal Aunt    Social History:  reports that she has been smoking Cigarettes.  She has a 11.5 pack-year smoking history. She has never used smokeless tobacco. She reports that she drinks alcohol. She reports that she does not use illicit drugs.  Allergies:  Allergies  Allergen Reactions  . Metronidazole Swelling  . Tramadol Nausea Only     (Not in a hospital admission)  Results for orders placed during the hospital encounter of 02/23/14 (from the past 48 hour(s))  APTT     Status: None   Collection Time    02/23/14 10:45 AM      Result Value Ref Range   aPTT 31  24 - 37 seconds  PROTIME-INR     Status: None   Collection Time    02/23/14 10:45 AM      Result Value Ref Range   Prothrombin Time 12.6  11.6 - 15.2 seconds   INR 0.96  0.00 - 1.49   No results found.  Review of Systems  Constitutional: Negative for fever.  Respiratory: Negative for shortness of breath.   Cardiovascular: Negative for chest pain.  Gastrointestinal: Positive for nausea. Negative for vomiting and abdominal pain.  Neurological: Positive for weakness.  Psychiatric/Behavioral: Positive for substance abuse.       Smoker    Blood pressure 144/94, pulse 102, temperature 99 F (37.2 C), temperature source Oral, resp. rate 18, last menstrual period 11/12/2012, SpO2 100.00%. Physical Exam  Constitutional: She is oriented to person, place, and time. She appears well-developed and well-nourished.  Cardiovascular: Normal rate, regular rhythm and normal heart sounds.   No murmur heard. Respiratory: Effort normal and breath sounds normal. She has no wheezes.  B mastectomy  GI: There is no tenderness.  Musculoskeletal: Normal range of motion.  Neurological: She is alert and oriented to person, place, and time.  Skin: Skin is warm and dry.  Psychiatric: She has a normal mood and affect. Her behavior is  normal. Judgment and thought content normal.     Assessment/Plan Breast Ca Hx New Liver mets Scheduled for PAC placement- for chemo Pt aware of procedure benefits and risks and agreeable to proceed Consent signed and in chart   [name] 02/23/2014, 12:24 PM

## 2014-02-24 ENCOUNTER — Encounter: Payer: Self-pay | Admitting: Oncology

## 2014-02-24 NOTE — ED Provider Notes (Signed)
Medical screening examination/treatment/procedure(s) were performed by non-physician practitioner and as supervising physician I was immediately available for consultation/collaboration.   EKG Interpretation None       Virgel Manifold, MD 02/24/14 [phone]

## 2014-02-24 NOTE — Progress Notes (Signed)
Patient has medicaid. May be declined any asst.

## 2014-02-28 ENCOUNTER — Ambulatory Visit
Admission: RE | Admit: 2014-02-28 | Discharge: 2014-02-28 | Disposition: A | Discharge status: Home / Self Care | Payer: Medicaid Other | Source: Ambulatory Visit | Attending: Physician Assistant | Admitting: Physician Assistant

## 2014-02-28 ENCOUNTER — Ambulatory Visit (HOSPITAL_BASED_OUTPATIENT_CLINIC_OR_DEPARTMENT_OTHER): Payer: Medicaid Other | Admitting: Hematology and Oncology

## 2014-02-28 ENCOUNTER — Encounter: Payer: Self-pay | Admitting: *Deleted

## 2014-02-28 ENCOUNTER — Ambulatory Visit (HOSPITAL_BASED_OUTPATIENT_CLINIC_OR_DEPARTMENT_OTHER): Payer: Medicaid Other

## 2014-02-28 ENCOUNTER — Other Ambulatory Visit (HOSPITAL_COMMUNITY)
Admission: RE | Admit: 2014-02-28 | Discharge: 2014-02-28 | Disposition: A | Discharge status: Home / Self Care | Payer: Medicaid Other | Source: Ambulatory Visit | Attending: Interventional Radiology | Admitting: Interventional Radiology

## 2014-02-28 ENCOUNTER — Other Ambulatory Visit: Payer: Self-pay | Admitting: *Deleted

## 2014-02-28 VITALS — BP 128/85 | HR 89 | Temp 98.8°F | Resp 18 | Ht 62.0 in | Wt 149.3 lb

## 2014-02-28 DIAGNOSIS — C50919 Malignant neoplasm of unspecified site of unspecified female breast: Secondary | ICD-10-CM

## 2014-02-28 DIAGNOSIS — E876 Hypokalemia: Secondary | ICD-10-CM

## 2014-02-28 DIAGNOSIS — C7989 Secondary malignant neoplasm of other specified sites: Secondary | ICD-10-CM

## 2014-02-28 DIAGNOSIS — C787 Secondary malignant neoplasm of liver and intrahepatic bile duct: Secondary | ICD-10-CM

## 2014-02-28 DIAGNOSIS — E049 Nontoxic goiter, unspecified: Secondary | ICD-10-CM

## 2014-02-28 DIAGNOSIS — C7952 Secondary malignant neoplasm of bone marrow: Secondary | ICD-10-CM

## 2014-02-28 DIAGNOSIS — Z9010 Acquired absence of unspecified breast and nipple: Secondary | ICD-10-CM | POA: Insufficient documentation

## 2014-02-28 DIAGNOSIS — F17200 Nicotine dependence, unspecified, uncomplicated: Secondary | ICD-10-CM | POA: Diagnosis not present

## 2014-02-28 DIAGNOSIS — Z170 Estrogen receptor positive status [ER+]: Secondary | ICD-10-CM | POA: Insufficient documentation

## 2014-02-28 DIAGNOSIS — C7951 Secondary malignant neoplasm of bone: Secondary | ICD-10-CM

## 2014-02-28 DIAGNOSIS — C779 Secondary and unspecified malignant neoplasm of lymph node, unspecified: Secondary | ICD-10-CM

## 2014-02-28 DIAGNOSIS — K5900 Constipation, unspecified: Secondary | ICD-10-CM

## 2014-02-28 DIAGNOSIS — C762 Malignant neoplasm of abdomen: Secondary | ICD-10-CM

## 2014-02-28 DIAGNOSIS — C50419 Malignant neoplasm of upper-outer quadrant of unspecified female breast: Secondary | ICD-10-CM

## 2014-02-28 DIAGNOSIS — C50411 Malignant neoplasm of upper-outer quadrant of right female breast: Secondary | ICD-10-CM

## 2014-02-28 DIAGNOSIS — Z5111 Encounter for antineoplastic chemotherapy: Secondary | ICD-10-CM

## 2014-02-28 LAB — COMPREHENSIVE METABOLIC PANEL (CC13)
ALK PHOS: 173 U/L — AB (ref 40–150)
ALT: 76 U/L — AB (ref 0–55)
AST: 42 U/L — AB (ref 5–34)
Albumin: 4 g/dL (ref 3.5–5.0)
Anion Gap: 9 mEq/L (ref 3–11)
BILIRUBIN TOTAL: 0.58 mg/dL (ref 0.20–1.20)
BUN: 8.7 mg/dL (ref 7.0–26.0)
CO2: 24 meq/L (ref 22–29)
CREATININE: 0.8 mg/dL (ref 0.6–1.1)
Calcium: 10.3 mg/dL (ref 8.4–10.4)
Chloride: 106 mEq/L (ref 98–109)
Glucose: 113 mg/dl (ref 70–140)
Potassium: 3.4 mEq/L — ABNORMAL LOW (ref 3.5–5.1)
Sodium: 139 mEq/L (ref 136–145)
Total Protein: 7.4 g/dL (ref 6.4–8.3)

## 2014-02-28 LAB — CBC WITH DIFFERENTIAL/PLATELET
BASO%: 0.4 % (ref 0.0–2.0)
Basophils Absolute: 0 10*3/uL (ref 0.0–0.1)
EOS%: 4 % (ref 0.0–7.0)
Eosinophils Absolute: 0.2 10*3/uL (ref 0.0–0.5)
HEMATOCRIT: 36.2 % (ref 34.8–46.6)
HGB: 11.8 g/dL (ref 11.6–15.9)
LYMPH%: 44.4 % (ref 14.0–49.7)
MCH: 28.4 pg (ref 25.1–34.0)
MCHC: 32.6 g/dL (ref 31.5–36.0)
MCV: 87 fL (ref 79.5–101.0)
MONO#: 0.3 10*3/uL (ref 0.1–0.9)
MONO%: 5.2 % (ref 0.0–14.0)
NEUT#: 2.3 10*3/uL (ref 1.5–6.5)
NEUT%: 46 % (ref 38.4–76.8)
PLATELETS: 301 10*3/uL (ref 145–400)
RBC: 4.16 10*6/uL (ref 3.70–5.45)
RDW: 13.9 % (ref 11.2–14.5)
WBC: 5 10*3/uL (ref 3.9–10.3)
lymph#: 2.2 10*3/uL (ref 0.9–3.3)

## 2014-02-28 MED ORDER — SODIUM CHLORIDE 0.9 % IV SOLN
Freq: Once | INTRAVENOUS | Status: AC
Start: 2014-02-28 — End: 2014-02-28
  Administered 2014-02-28: 13:00:00 via INTRAVENOUS

## 2014-02-28 MED ORDER — LACTULOSE SOLN: 20.0000 mL | Freq: Every evening | Status: DC | PRN

## 2014-02-28 MED ORDER — HEPARIN SOD (PORK) LOCK FLUSH 100 UNIT/ML IV SOLN
500.0000 [IU] | Freq: Once | INTRAVENOUS | Status: AC
  Administered 2014-02-28: 500 [IU] via INTRAVENOUS
  Filled 2014-02-28: qty 5

## 2014-02-28 MED ORDER — ONDANSETRON 8 MG/50ML IVPB (CHCC)
8.0000 mg | Freq: Once | INTRAVENOUS | Status: AC
  Administered 2014-02-28: 8 mg via INTRAVENOUS

## 2014-02-28 MED ORDER — LORAZEPAM 1 MG PO TABS
ORAL_TABLET | ORAL | Status: AC
  Filled 2014-02-28: qty 1

## 2014-02-28 MED ORDER — SODIUM CHLORIDE 0.9 % IJ SOLN
10.0000 mL | INTRAMUSCULAR | Status: AC | PRN
  Administered 2014-02-28: 10 mL via INTRAVENOUS
  Filled 2014-02-28: qty 10

## 2014-02-28 MED ORDER — LORAZEPAM 1 MG PO TABS
0.5000 mg | ORAL_TABLET | Freq: Once | ORAL | Status: AC
  Administered 2014-02-28: 0.5 mg via ORAL

## 2014-02-28 MED ORDER — PACLITAXEL PROTEIN-BOUND CHEMO INJECTION 100 MG
100.0000 mg/m2 | Freq: Once | INTRAVENOUS | Status: AC
  Administered 2014-02-28: 175 mg via INTRAVENOUS
  Filled 2014-02-28: qty 35

## 2014-02-28 MED ORDER — ZOLEDRONIC ACID 4 MG/100ML IV SOLN
4.0000 mg | Freq: Once | INTRAVENOUS | Status: AC
  Administered 2014-02-28: 4 mg via INTRAVENOUS
  Filled 2014-02-28: qty 100

## 2014-02-28 MED ORDER — HEPARIN SOD (PORK) LOCK FLUSH 100 UNIT/ML IV SOLN
500.0000 [IU] | Freq: Once | INTRAVENOUS | Status: AC | PRN
  Administered 2014-02-28: 500 [IU]
  Filled 2014-02-28: qty 5

## 2014-02-28 MED ORDER — DEXAMETHASONE SODIUM PHOSPHATE 10 MG/ML IJ SOLN
INTRAMUSCULAR | Status: AC
  Filled 2014-02-28: qty 1

## 2014-02-28 MED ORDER — SODIUM CHLORIDE 0.9 % IJ SOLN
10.0000 mL | INTRAMUSCULAR | Status: DC | PRN
  Administered 2014-02-28: 10 mL
  Filled 2014-02-28: qty 10

## 2014-02-28 MED ORDER — DEXAMETHASONE SODIUM PHOSPHATE 10 MG/ML IJ SOLN
10.0000 mg | Freq: Once | INTRAMUSCULAR | Status: AC
  Administered 2014-02-28: 10 mg via INTRAVENOUS

## 2014-02-28 MED ORDER — ONDANSETRON 8 MG/NS 50 ML IVPB
INTRAVENOUS | Status: AC
  Filled 2014-02-28: qty 8

## 2014-02-28 NOTE — Progress Notes (Signed)
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 concerning for carcinomatosis. 3. Diffuse osseous metastatic disease demonstrated by CT shows only minimally heterogeneous signal and enhancement on MRI and may be treated. 4. Persistent left UPJ obstruction. 5. Cholelithiasis.   Electronically Signed   By: BCamie PatienceM.D.   On: 02/08/2014 09:19      ASSESSMENT: 48y.o. 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  On the left, only atypical ductal hyperplasia.  On the right upper outer quadrant, high-grade invasive ductal carcinoma, clincally T2 N0, stage IIA  (2)  Treated in the neoadjuvant setting with docetaxel, doxorubicin, and cyclophosphamide x6, chemotherapy completed in December 2010.    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  However three out of 10 lymph nodes were positive.  ypTis ypN1, stage IIB. Tumor was strongly estrogen receptor/progesterone receptor positive, HER2/neu negative with a high proliferation fraction.   (4)  adjuvant radiation therapy, completed May 2011,   (5)  on tamoxifen May 2011 to  January 2014 when she was found to have stage IV disease  (6) s/p TAH-BSO 11/25/2012 with metastatic brast cancer, estrogen receptor 30% and progestrerone receptor 20% positive, HER-2 negative  (7) anastrozole started February 2014, discontinued in April 2014 due to poor tolerance  (8)  patient started letrozole in mid May 2014, discontinued August 2014 with progression  (9)  zoledronic acid given every 28 days for bony metastatic disease, first dose in May 2014; changed to every 6 weeks beginning 02/28/2014 2 better coordinate with her Abraxane treatments  (10) skin involvement over the left breast and possibly other distant skin sites noted June 2014, with   (a) biopsy of the left breast and a left axillary node 04/29/2013  confirming 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  (11) status post bilateral mastectomies 05/25/2013, showing:  (a) on the left, mypT1c NX invasive mammary carcinoma, with ductal and lobular features, grade 1, repeat HER-2 again negative  (b) on the right, yp T2 NX invasive lobular breast cancer, grade 1, with negative margins, and HER-2 again negative  (12) right chest wall skin biopsy and right neck biopsy both positive for metastatic breast cancer  (13) fulvestrant at 500 mg monthly started 06/10/2013, last dose 01/17/2014, with progression  (14)  Hot flashes, improving on clonidine at bedtime  (15)  Depression, declines oral anti-depreesants  (16)  Skin lesion, right arm  (17)  thyroid nodules - pending biopsy   (18) Abraxane, first dose 02/21/2014, to be given day 1 and day 8 of every 21  day cycle  (19)  Dysphagia - swallowing study pending  PLAN: Ms. Bloch tolerated cycle 1 day 1 of chemotherapy with Abraxane quite well. We will continue C 1 day 8 of Abraxane today. Her labs are acceptable for treatment today though she has borderline low potassium levels but she says she would rather eat more fruits. I have encouraged her to eat Bananas, strawberries and kiwi fruits.  She will also be receiving zoledronic acid 4 mg to be given once every 6 weeks to coincide with Abraxane treatment dates. She is scheduled to be given today  Will follow up with path report on the ultrasound guided thyroid nodule biopsy done today  Swallowing study which was ordered for dysphagia is  pending. Since she has difficulty in swallowing both for solids and liquids, possibly she needs gastroenterology evaluation after swallowing studies .  Plan for restaging MRI after 4 cycles of Abraxane therapy as recommended  I have also prescribed lactulose 20 cc #255mto be taken as needed for  constipation   CLocklyn voices understanding and agreement with the above plan.     total time spent 30 minutes   PWilmon Arms MD Medical oncology 02/28/2014

## 2014-02-28 NOTE — Progress Notes (Signed)
[facility]  Clinical Social Work met with patient in infusion room today.  CSW familiar with patient- patient recently attended [facility] series.  Tammy Blanchard shared her "updates" on her cancer journey with CSW.  She expressed concern regarding how her family was coping with her recent recurrence.  CSW and patient explored different coping strategies and communication skills patient may use with family.  Patient plans to contact CSW regarding arranging a family session in [place] office.  Tammy Blanchard, MSW, LCSW, OSW-C Clinical Social Worker [facility] [phone]

## 2014-02-28 NOTE — Patient Instructions (Signed)
[facility] Discharge Instructions for Patients Receiving Chemotherapy  Today you received the following chemotherapy agents :  Abraxane.  To help prevent nausea and vomiting after your treatment, we encourage you to take your nausea medication as prescribed by your physician.   If you develop nausea and vomiting that is not controlled by your nausea medication, call the clinic.   BELOW ARE SYMPTOMS THAT SHOULD BE REPORTED IMMEDIATELY:  *FEVER GREATER THAN 100.5 F  *CHILLS WITH OR WITHOUT FEVER  NAUSEA AND VOMITING THAT IS NOT CONTROLLED WITH YOUR NAUSEA MEDICATION  *UNUSUAL SHORTNESS OF BREATH  *UNUSUAL BRUISING OR BLEEDING  TENDERNESS IN MOUTH AND THROAT WITH OR WITHOUT PRESENCE OF ULCERS  *URINARY PROBLEMS  *BOWEL PROBLEMS  UNUSUAL RASH Items with * indicate a potential emergency and should be followed up as soon as possible.  Feel free to call the clinic you have any questions or concerns. The clinic phone number is [phone].    

## 2014-03-01 ENCOUNTER — Other Ambulatory Visit: Payer: Self-pay | Admitting: Physician Assistant

## 2014-03-01 ENCOUNTER — Telehealth: Payer: Self-pay | Admitting: *Deleted

## 2014-03-01 NOTE — Telephone Encounter (Signed)
Called pt to inform  her of pathology results concerning thyroid biopsy.Explained to pt that findings were consistent with a benign follicular nodule meaning no cancer. Pt was pleased results. Pt also wanted to know if she should continue thyroid medication. Told pt we will draw a TSH level next appt and make a decision as the route that is best for her. Pt verbalized understanding. No further concerns.Message to be forwarded to [employer], PA-C.

## 2014-03-03 ENCOUNTER — Ambulatory Visit (HOSPITAL_COMMUNITY)
Admission: RE | Admit: 2014-03-03 | Discharge: 2014-03-03 | Disposition: A | Discharge status: Home / Self Care | Payer: Medicaid Other | Source: Ambulatory Visit | Attending: Physician Assistant | Admitting: Physician Assistant

## 2014-03-03 ENCOUNTER — Telehealth: Payer: Self-pay | Admitting: Hematology and Oncology

## 2014-03-03 DIAGNOSIS — K222 Esophageal obstruction: Secondary | ICD-10-CM | POA: Insufficient documentation

## 2014-03-03 DIAGNOSIS — R1310 Dysphagia, unspecified: Secondary | ICD-10-CM | POA: Insufficient documentation

## 2014-03-03 DIAGNOSIS — Z853 Personal history of malignant neoplasm of breast: Secondary | ICD-10-CM | POA: Insufficient documentation

## 2014-03-03 DIAGNOSIS — K219 Gastro-esophageal reflux disease without esophagitis: Secondary | ICD-10-CM | POA: Insufficient documentation

## 2014-03-03 DIAGNOSIS — K449 Diaphragmatic hernia without obstruction or gangrene: Secondary | ICD-10-CM | POA: Insufficient documentation

## 2014-03-03 NOTE — Telephone Encounter (Signed)
, °

## 2014-03-07 ENCOUNTER — Other Ambulatory Visit: Payer: Self-pay | Admitting: *Deleted

## 2014-03-07 ENCOUNTER — Encounter: Payer: Self-pay | Admitting: Physician Assistant

## 2014-03-07 ENCOUNTER — Telehealth: Payer: Self-pay | Admitting: Oncology

## 2014-03-07 ENCOUNTER — Other Ambulatory Visit (HOSPITAL_BASED_OUTPATIENT_CLINIC_OR_DEPARTMENT_OTHER): Payer: Medicaid Other

## 2014-03-07 ENCOUNTER — Ambulatory Visit (HOSPITAL_BASED_OUTPATIENT_CLINIC_OR_DEPARTMENT_OTHER): Payer: Medicaid Other | Admitting: Hematology and Oncology

## 2014-03-07 VITALS — BP 113/74 | HR 98 | Temp 98.8°F | Resp 18 | Ht 62.0 in | Wt 148.1 lb

## 2014-03-07 DIAGNOSIS — C50919 Malignant neoplasm of unspecified site of unspecified female breast: Secondary | ICD-10-CM

## 2014-03-07 DIAGNOSIS — C7952 Secondary malignant neoplasm of bone marrow: Secondary | ICD-10-CM

## 2014-03-07 DIAGNOSIS — E049 Nontoxic goiter, unspecified: Secondary | ICD-10-CM

## 2014-03-07 DIAGNOSIS — C50411 Malignant neoplasm of upper-outer quadrant of right female breast: Secondary | ICD-10-CM

## 2014-03-07 DIAGNOSIS — R1310 Dysphagia, unspecified: Secondary | ICD-10-CM

## 2014-03-07 DIAGNOSIS — C50419 Malignant neoplasm of upper-outer quadrant of unspecified female breast: Secondary | ICD-10-CM

## 2014-03-07 DIAGNOSIS — C7951 Secondary malignant neoplasm of bone: Secondary | ICD-10-CM

## 2014-03-07 DIAGNOSIS — C779 Secondary and unspecified malignant neoplasm of lymph node, unspecified: Secondary | ICD-10-CM

## 2014-03-07 DIAGNOSIS — Z170 Estrogen receptor positive status [ER+]: Secondary | ICD-10-CM

## 2014-03-07 DIAGNOSIS — C787 Secondary malignant neoplasm of liver and intrahepatic bile duct: Secondary | ICD-10-CM

## 2014-03-07 DIAGNOSIS — C762 Malignant neoplasm of abdomen: Secondary | ICD-10-CM

## 2014-03-07 DIAGNOSIS — C7989 Secondary malignant neoplasm of other specified sites: Principal | ICD-10-CM

## 2014-03-07 MED ORDER — HYDROCODONE-ACETAMINOPHEN 7.5-325 MG/15ML PO SOLN: 10.0000 mL | Freq: Four times a day (QID) | ORAL | Status: DC | PRN

## 2014-03-07 MED ORDER — MAGIC MOUTHWASH W/LIDOCAINE: 5.0000 mL | Freq: Four times a day (QID) | ORAL | Status: DC

## 2014-03-07 NOTE — Telephone Encounter (Signed)
, °

## 2014-03-07 NOTE — Progress Notes (Signed)
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 similar to recent CT.  The widespread marrow abnormalities demonstrated on CT are less apparent on MRI. There is only mildly heterogeneous marrow signal and enhancement on MRI.  IMPRESSION: 1. Multifocal hepatic metastatic disease, unchanged from recent CT. 2. Ascites and omental/peritoneal nodularity (better seen on CT), concerning for carcinomatosis. 3. Diffuse osseous metastatic disease demonstrated by CT shows only minimally heterogeneous signal and enhancement on MRI and may be treated. 4. Persistent left UPJ obstruction. 5. Cholelithiasis.   Electronically Signed   By: BCamie PatienceM.D.   On: 02/08/2014 09:19      ASSESSMENT:  48y.o. 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  On the left,  only atypical ductal hyperplasia.  On the right upper outer quadrant, high-grade invasive ductal carcinoma, clincally T2 N0, stage IIA  (2)  Treated in the neoadjuvant setting with docetaxel, doxorubicin, and cyclophosphamide x6, chemotherapy completed in December 2010.    (3)  Status post right lumpectomy and axillary lymph node dissection in January 2011 for what proved to be a residual  microscopic area of ductal carcinoma in situ only.  However three out of 10 lymph nodes were positive.  ypTis ypN1, stage IIB. Tumor was strongly estrogen receptor/progesterone receptor positive, HER2/neu negative with a high proliferation fraction.   (4)  adjuvant radiation therapy, completed May 2011,   (5)  on tamoxifen May 2011 to January 2014 when she was found to have stage IV disease  (6) s/p TAH-BSO 11/25/2012 with metastatic brast cancer, estrogen receptor 30% and progestrerone receptor 20% positive, HER-2 negative  (7) anastrozole started February 2014, discontinued in April 2014 due to poor tolerance  (8)  patient started letrozole in mid May 2014, discontinued August 2014 with progression  (9)  zoledronic acid given every 28 days for bony metastatic disease, first dose in May 2014; changed to every 6 weeks beginning 02/28/2014 2 better coordinate with her Abraxane treatments  (10) skin involvement over the left breast and possibly other distant skin sites noted June 2014, with   (a) biopsy of the left breast and a left axillary node 04/29/2013  confirming 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  (11) status post bilateral mastectomies 05/25/2013, showing:  (a) on the left, mypT1c NX invasive mammary carcinoma, with ductal and lobular features, grade 1, repeat HER-2 again negative  (b) on the right, yp T2 NX invasive lobular breast cancer, grade 1, with negative margins, and HER-2 again negative  (12) right chest wall skin biopsy and right neck biopsy both positive for metastatic breast cancer  (13) fulvestrant at  500 mg monthly started 06/10/2013, last dose 01/17/2014, with progression  (14)  Hot flashes, improving on clonidine at bedtime  (15)  Depression, declines oral anti-depreesants  (16)  Skin lesion, right arm  (17)  thyroid nodules - biopsy performed on 02/28/2014 revealed benign follicular nodule   (18) Abraxane, first dose 02/21/2014, to be given day 1 and day 8 of every 21 day cycle  (19)  Dysphagia - Esophagogram/barium swallow study which was done on 03/03/2014 revealed focal area of smooth stricture narrowing involving the mid esophagus and no masses noted and was recommended to have endoscopy.  PLAN: Ms. Ard tolerated cycle 1 day 8 of chemotherapy with Abraxane quite well. We'll plan for cycle 2 day 1 of Abraxane therapy next week  She will also be receiving zoledronic acid 4 mg to be given once every 6 weeks to coincide with Abraxane treatment dates. Last treatment was on 02/28/2014  Will refer Lonna to  gastroenterology for further evaluation of dysphagia and also for possible endoscopy in view of questionable structures/narrowing of mid esophagus.  I have given prescription for Magic mouthwash to swish and swallow once every 6 hours  I have also given liquid suspension of Norco 7.5/325mg # dispense 40 doses for worsening left parascapular pain. Will review the  CAT/bone scan  report with radiology to assess for location of bony mets and for possible radiation therapy to painful bony metastases  plan for restaging MRI after 4 cycles of Abraxane therapy as recommended  [place]  voices understanding and agreement with the above plan.     total time spent 30 minutes   Wilmon Arms, MD Medical oncology 02/28/2014

## 2014-03-13 ENCOUNTER — Other Ambulatory Visit: Payer: Self-pay | Admitting: *Deleted

## 2014-03-13 ENCOUNTER — Other Ambulatory Visit: Payer: Self-pay | Admitting: Hematology and Oncology

## 2014-03-13 ENCOUNTER — Ambulatory Visit (INDEPENDENT_AMBULATORY_CARE_PROVIDER_SITE_OTHER): Payer: Medicaid Other | Admitting: Physician Assistant

## 2014-03-13 ENCOUNTER — Encounter: Payer: Self-pay | Admitting: Physician Assistant

## 2014-03-13 VITALS — BP 120/80 | HR 91 | Ht 63.0 in | Wt 148.0 lb

## 2014-03-13 DIAGNOSIS — R1310 Dysphagia, unspecified: Secondary | ICD-10-CM

## 2014-03-13 DIAGNOSIS — K222 Esophageal obstruction: Secondary | ICD-10-CM

## 2014-03-13 MED ORDER — PANTOPRAZOLE SODIUM 40 MG PO TBEC: 40.0000 mg | DELAYED_RELEASE_TABLET | Freq: Every day | ORAL | Status: DC

## 2014-03-13 NOTE — Progress Notes (Addendum)
Subjective:    Patient ID: Tammy Blanchard, female    DOB: 1966/09/12, 48 y.o.   MRN: [id number]  HPI  Tammy Blanchard is a very nice 48 year old African American female new to GI today referred by Dr. Kamineni/oncology. She has unfortunately metastatic breast cancer involving the bones, uterus, ovaries , liver and abdomen (carcinomatosis )and skin. She was initially diagnosed and underwent a right lumpectomy in 2011 and had 3/10 positive lymph nodes. She completed a course of radiation in 2011., She underwent total abdominal hysterectomy and BSO in January of 2014. She has been on multiple different chemotherapy regimens and is currently being treated with Abraxane . She had complaints of dysphagia over the past couple of months and was scheduled for a barium swallow which shows a focal area of smooth strictured narrowing involving the mid esophagus felt possibly due to radiation or reflux a 13 mm barium tablet did not pass through the area. She also had recent re\re staging CT scan CT of the chest in March of 2015 showed 1 right hilar node mild anterior mediastinal soft tissue and mid to lower esophageal wall thickening/edema. CT of the abdomen and pelvis done at that same time showed numerous hepatic lesions and a small amount of ascites. Patient says again that she's been having difficulty swallowing over the past 6-8 weeks. She says over the past week her symptoms are rectally not as bad. He has not been on any regular PPI therapy. She says she has had difficulty with liquids and solids but more so with solids.She  says  food feels as if it is "hanging up". Usually she is able to push this down with sipping on water. Generally most of the time she'll stop eating when she is having difficulty and has lost about 10 pounds since onset of these symptoms. She's also been having episodes of regurgitation of food. She denies any significant heartburn or indigestion and denies any abdominal pain.    Review of  Systems  Constitutional: Positive for unexpected weight change.  HENT: Positive for trouble swallowing.   Eyes: Negative.   Respiratory: Negative.   Cardiovascular: Negative.   Gastrointestinal: Positive for vomiting.  Endocrine: Negative.   Genitourinary: Negative.   Musculoskeletal: Negative.   Allergic/Immunologic: Negative.   Neurological: Negative.   Hematological: Negative.   Psychiatric/Behavioral: Negative.    Outpatient Prescriptions Prior to Visit  Medication Sig Dispense Refill  . Alum & Mag Hydroxide-Simeth (MAGIC MOUTHWASH W/LIDOCAINE) SOLN Take 5 mLs by mouth 4 (four) times daily.  450 mL  2  . amLODipine (NORVASC) 10 MG tablet Take 10 mg by mouth every morning.      . calcium carbonate (TUMS - DOSED IN MG ELEMENTAL CALCIUM) 500 MG chewable tablet Chew 1 tablet by mouth daily as needed for indigestion.       . cloNIDine (CATAPRES) 0.2 MG tablet Take 0.2 mg by mouth at bedtime.      Marland Kitchen HYDROcodone-acetaminophen (NORCO/VICODIN) 5-325 MG per tablet Take 1-2 tablets by mouth every 8 (eight) hours as needed for moderate pain.      Marland Kitchen ibuprofen (ADVIL,MOTRIN) 200 MG tablet Take 400 mg by mouth every 8 (eight) hours as needed.      . Lactulose SOLN Take 20 mLs by mouth at bedtime as needed.  200 mL  2  . lidocaine-prilocaine (EMLA) cream Apply 1 application topically as needed. Apply over portacath  1 1/2 hours to 2 hours prior to procedures as needed.  30 g  1  .  LORazepam (ATIVAN) 0.5 MG tablet Take 1 tablet (0.5 mg total) by mouth every 8 (eight) hours as needed for anxiety (or insomnia).  45 tablet  0  . losartan (COZAAR) 100 MG tablet Take 100 mg by mouth every morning.      . ondansetron (ZOFRAN) 8 MG tablet Take 1 tablet (8 mg total) by mouth 2 (two) times daily. Start the day after chemo for 2 days. Then take as needed for nausea or vomiting.  30 tablet  1  . prochlorperazine (COMPAZINE) 10 MG tablet Take 10 mg by mouth every 6 (six) hours as needed for nausea or vomiting.       . Zoledronic Acid (ZOMETA IV) Inject 4 mg into the vein every 30 (thirty) days.       Marland Kitchen HYDROcodone-acetaminophen (HYCET) 7.5-325 mg/15 ml solution Take 10 mLs by mouth every 6 (six) hours as needed for moderate pain.  120 mL  0   Facility-Administered Medications Prior to Visit  Medication Dose Route Frequency Provider Last Rate Last Dose  . sodium chloride 0.9 % injection 10 mL  10 mL Intravenous PRN Chauncey Cruel, MD   10 mL at 02/28/14 1109   Allergies  Allergen Reactions  . Metronidazole Swelling  . Tramadol Nausea Only   Patient Active Problem List   Diagnosis Date Noted  . 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 Hypokalemia 11/22/2013  . Anxiety 08/30/2013  . Insomnia 08/30/2013  . Skin lesion of right arm 07/26/2013  . Breast cancer 07/22/2013  . Breast cancer of upper-outer quadrant of right female breast 06/10/2013  . Breast cancer metastasized to bone 05/24/2013  . Vision changes 05/18/2013  . 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 ADVERSE DRUG REACTION 12/05/2010  . MICROSCOPIC HEMATURIA 12/03/2010  . VAGINAL DISCHARGE 12/03/2010  . THYROID MASS 08/02/2009  . HYPERTHYROIDISM 08/02/2009  . JOINT EFFUSION, LEFT KNEE 04/23/2009  . GERD 12/18/2008  . TOBACCO ABUSE 07/26/2008  . REACTIVE AIRWAY DISEASE 07/26/2008  . CALLUSES, FEET, BILATERAL 04/10/2008  . CONSTIPATION 12/02/2007  . SUBSCAPULARIS SPRAIN AND STRAIN 12/02/2007  . ALLERGIC RHINITIS 06/17/2007  . ANEMIA-IRON DEFICIENCY 06/27/2005  . HYPERLIPIDEMIA 02/19/2005  . HYPERTENSION 04/15/2004   History  Substance Use Topics  . Smoking status: Current Some Day Smoker -- 0.50 packs/day for 23 years    Types: Cigarettes    Last Attempt to Quit: 11/25/2012  . Smokeless tobacco: Never Used  . Alcohol Use: Yes     Comment: social   family history includes Diabetes in her maternal aunt, maternal aunt, maternal grandmother, and mother; Heart disease in her maternal aunt and maternal grandmother;  Hypertension in her maternal aunt, maternal aunt, maternal grandmother, and mother; Stroke in her maternal aunt and maternal grandmother.     Objective:   Physical Exam  well-developed  AA female in no distress, very pleasant. Blood pressure 120/80 pulse 90 one height 5 foot 3 weight 148. HEENT; nontraumatic normocephalic EOMI PERRLA sclera anicteric, Supple; no JVD, Cardiovascular; regular rate and rhythm with S1-S2 no murmur or gallop, Pulmonary ;clear bilaterally, Abdomen; soft no palpable mass or hepatosplenomegaly no guarding or rebound bowel sounds are present it is nontender, Rectal; exam not done, Extremities; no clubbing cyanosis or edema skin warm and dry, Psych; mood and affect appropriate.        Assessment & Plan:  #21  48 year old female with metastatic breast cancer to  bones, liver and abdominal cavity (carcinomatosis) and skin. She is also status post TAH/BSO  for metastatic  disease. New symptom of dysphagia x6-8 weeks with a smooth mid esophageal stricture found on Barium swallow. Patient has not had radiation to her chest since 2011 so feel less likely radiation-induced. Rule out extrinsic compression due to mediastinal adenopathy  vs metastatic involvement  of the esophagus.  Plan; as patient is undergoing chemotherapy will schedule EGD for her off week with Dr. Hilarie Fredrickson. Procedure discussed in detail with the patient including possible esophageal dilation and she is agreeable to proceed. Start Protonix  40 mg by mouth every morning, patient was provided with a few samples of Dexilant in the interim, until she has protonix filled. We discussed very soft to full liquid diet   Addendum: Reviewed and agree with initial management. Jerene Bears, MD

## 2014-03-13 NOTE — Patient Instructions (Signed)
You have been scheduled for an endoscopy with propofol. Please follow written instructions given to you at your visit today. If you use inhalers (even only as needed), please bring them with you on the day of your procedure. Your physician has requested that you go to www.startemmi.com and enter the access code given to you at your visit today. This web site gives a general overview about your procedure. However, you should still follow specific instructions given to you by our office regarding your preparation for the procedure. We have given you samples of Dexilant to take 20-30 minutes prior to breakfast.  We have also printed you a prescription for Protonix. CC:  Dr Doreene Burke

## 2014-03-14 ENCOUNTER — Ambulatory Visit (HOSPITAL_BASED_OUTPATIENT_CLINIC_OR_DEPARTMENT_OTHER): Payer: Medicaid Other | Admitting: Oncology

## 2014-03-14 ENCOUNTER — Other Ambulatory Visit: Payer: Self-pay | Admitting: Medical Oncology

## 2014-03-14 ENCOUNTER — Other Ambulatory Visit: Payer: Medicaid Other

## 2014-03-14 ENCOUNTER — Ambulatory Visit: Payer: Self-pay

## 2014-03-14 ENCOUNTER — Other Ambulatory Visit: Payer: Self-pay | Admitting: *Deleted

## 2014-03-14 ENCOUNTER — Ambulatory Visit (HOSPITAL_BASED_OUTPATIENT_CLINIC_OR_DEPARTMENT_OTHER): Payer: Medicaid Other

## 2014-03-14 VITALS — BP 142/96 | HR 92 | Temp 98.4°F | Resp 18 | Ht 63.0 in | Wt 147.7 lb

## 2014-03-14 DIAGNOSIS — C773 Secondary and unspecified malignant neoplasm of axilla and upper limb lymph nodes: Secondary | ICD-10-CM

## 2014-03-14 DIAGNOSIS — C762 Malignant neoplasm of abdomen: Secondary | ICD-10-CM

## 2014-03-14 DIAGNOSIS — Z5111 Encounter for antineoplastic chemotherapy: Secondary | ICD-10-CM

## 2014-03-14 DIAGNOSIS — C50419 Malignant neoplasm of upper-outer quadrant of unspecified female breast: Secondary | ICD-10-CM

## 2014-03-14 DIAGNOSIS — C7989 Secondary malignant neoplasm of other specified sites: Secondary | ICD-10-CM

## 2014-03-14 DIAGNOSIS — R61 Generalized hyperhidrosis: Secondary | ICD-10-CM

## 2014-03-14 DIAGNOSIS — C7960 Secondary malignant neoplasm of unspecified ovary: Secondary | ICD-10-CM

## 2014-03-14 DIAGNOSIS — C50411 Malignant neoplasm of upper-outer quadrant of right female breast: Secondary | ICD-10-CM

## 2014-03-14 DIAGNOSIS — C779 Secondary and unspecified malignant neoplasm of lymph node, unspecified: Secondary | ICD-10-CM

## 2014-03-14 DIAGNOSIS — C787 Secondary malignant neoplasm of liver and intrahepatic bile duct: Secondary | ICD-10-CM

## 2014-03-14 DIAGNOSIS — C7951 Secondary malignant neoplasm of bone: Secondary | ICD-10-CM

## 2014-03-14 DIAGNOSIS — C772 Secondary and unspecified malignant neoplasm of intra-abdominal lymph nodes: Secondary | ICD-10-CM

## 2014-03-14 DIAGNOSIS — C50919 Malignant neoplasm of unspecified site of unspecified female breast: Secondary | ICD-10-CM

## 2014-03-14 DIAGNOSIS — C7952 Secondary malignant neoplasm of bone marrow: Secondary | ICD-10-CM

## 2014-03-14 DIAGNOSIS — R071 Chest pain on breathing: Secondary | ICD-10-CM

## 2014-03-14 DIAGNOSIS — F341 Dysthymic disorder: Secondary | ICD-10-CM

## 2014-03-14 DIAGNOSIS — C786 Secondary malignant neoplasm of retroperitoneum and peritoneum: Secondary | ICD-10-CM

## 2014-03-14 DIAGNOSIS — C792 Secondary malignant neoplasm of skin: Secondary | ICD-10-CM

## 2014-03-14 DIAGNOSIS — R1310 Dysphagia, unspecified: Secondary | ICD-10-CM

## 2014-03-14 DIAGNOSIS — Z95828 Presence of other vascular implants and grafts: Secondary | ICD-10-CM

## 2014-03-14 LAB — COMPREHENSIVE METABOLIC PANEL (CC13)
ALBUMIN: 4.3 g/dL (ref 3.5–5.0)
ALT: 37 U/L (ref 0–55)
ANION GAP: 12 meq/L — AB (ref 3–11)
AST: 22 U/L (ref 5–34)
Alkaline Phosphatase: 172 U/L — ABNORMAL HIGH (ref 40–150)
BUN: 8.9 mg/dL (ref 7.0–26.0)
CALCIUM: 10.2 mg/dL (ref 8.4–10.4)
CHLORIDE: 106 meq/L (ref 98–109)
CO2: 22 mEq/L (ref 22–29)
Creatinine: 0.6 mg/dL (ref 0.6–1.1)
Glucose: 96 mg/dl (ref 70–140)
Potassium: 3.6 mEq/L (ref 3.5–5.1)
Sodium: 141 mEq/L (ref 136–145)
Total Bilirubin: 0.68 mg/dL (ref 0.20–1.20)
Total Protein: 7.7 g/dL (ref 6.4–8.3)

## 2014-03-14 LAB — CBC WITH DIFFERENTIAL/PLATELET
BASO%: 0.6 % (ref 0.0–2.0)
BASOS ABS: 0 10*3/uL (ref 0.0–0.1)
EOS ABS: 0.1 10*3/uL (ref 0.0–0.5)
EOS%: 1.8 % (ref 0.0–7.0)
HEMATOCRIT: 37.6 % (ref 34.8–46.6)
HGB: 12.4 g/dL (ref 11.6–15.9)
LYMPH#: 1.8 10*3/uL (ref 0.9–3.3)
LYMPH%: 38.7 % (ref 14.0–49.7)
MCH: 29 pg (ref 25.1–34.0)
MCHC: 33 g/dL (ref 31.5–36.0)
MCV: 87.7 fL (ref 79.5–101.0)
MONO#: 0.5 10*3/uL (ref 0.1–0.9)
MONO%: 10.9 % (ref 0.0–14.0)
NEUT#: 2.2 10*3/uL (ref 1.5–6.5)
NEUT%: 48 % (ref 38.4–76.8)
Platelets: 287 10*3/uL (ref 145–400)
RBC: 4.29 10*6/uL (ref 3.70–5.45)
RDW: 14.5 % (ref 11.2–14.5)
WBC: 4.6 10*3/uL (ref 3.9–10.3)

## 2014-03-14 MED ORDER — SODIUM CHLORIDE 0.9 % IJ SOLN
10.0000 mL | INTRAMUSCULAR | Status: DC | PRN
  Administered 2014-03-14: 10 mL via INTRAVENOUS
  Filled 2014-03-14: qty 10

## 2014-03-14 MED ORDER — ONDANSETRON 8 MG/NS 50 ML IVPB
INTRAVENOUS | Status: AC
  Filled 2014-03-14: qty 8

## 2014-03-14 MED ORDER — DEXAMETHASONE SODIUM PHOSPHATE 10 MG/ML IJ SOLN
INTRAMUSCULAR | Status: AC
  Filled 2014-03-14: qty 1

## 2014-03-14 MED ORDER — HEPARIN SOD (PORK) LOCK FLUSH 100 UNIT/ML IV SOLN
500.0000 [IU] | Freq: Once | INTRAVENOUS | Status: AC | PRN
  Administered 2014-03-14: 500 [IU]
  Filled 2014-03-14: qty 5

## 2014-03-14 MED ORDER — SODIUM CHLORIDE 0.9 % IV SOLN
Freq: Once | INTRAVENOUS | Status: AC
  Administered 2014-03-14: 10:00:00 via INTRAVENOUS

## 2014-03-14 MED ORDER — DEXAMETHASONE SODIUM PHOSPHATE 10 MG/ML IJ SOLN
10.0000 mg | Freq: Once | INTRAMUSCULAR | Status: AC
  Administered 2014-03-14: 10 mg via INTRAVENOUS

## 2014-03-14 MED ORDER — SODIUM CHLORIDE 0.9 % IJ SOLN
10.0000 mL | INTRAMUSCULAR | Status: DC | PRN
  Administered 2014-03-14: 10 mL
  Filled 2014-03-14: qty 10

## 2014-03-14 MED ORDER — PACLITAXEL PROTEIN-BOUND CHEMO INJECTION 100 MG
100.0000 mg/m2 | Freq: Once | INTRAVENOUS | Status: AC
  Administered 2014-03-14: 175 mg via INTRAVENOUS
  Filled 2014-03-14: qty 35

## 2014-03-14 MED ORDER — ONDANSETRON 8 MG/50ML IVPB (CHCC)
8.0000 mg | Freq: Once | INTRAVENOUS | Status: AC
  Administered 2014-03-14: 8 mg via INTRAVENOUS

## 2014-03-14 NOTE — Patient Instructions (Signed)
[facility] Discharge Instructions for Patients Receiving Chemotherapy  Today you received the following chemotherapy agents Abraxane  To help prevent nausea and vomiting after your treatment, we encourage you to take your nausea medication as prescribed.   If you develop nausea and vomiting that is not controlled by your nausea medication, call the clinic.   BELOW ARE SYMPTOMS THAT SHOULD BE REPORTED IMMEDIATELY:  *FEVER GREATER THAN 100.5 F  *CHILLS WITH OR WITHOUT FEVER  NAUSEA AND VOMITING THAT IS NOT CONTROLLED WITH YOUR NAUSEA MEDICATION  *UNUSUAL SHORTNESS OF BREATH  *UNUSUAL BRUISING OR BLEEDING  TENDERNESS IN MOUTH AND THROAT WITH OR WITHOUT PRESENCE OF ULCERS  *URINARY PROBLEMS  *BOWEL PROBLEMS  UNUSUAL RASH Items with * indicate a potential emergency and should be followed up as soon as possible.  Feel free to call the clinic you have any questions or concerns. The clinic phone number is [phone].    
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
## 2014-03-14 NOTE — Patient Instructions (Signed)

## 2014-03-18 ENCOUNTER — Telehealth: Payer: Self-pay | Admitting: Oncology

## 2014-03-18 NOTE — Telephone Encounter (Signed)
s.w. pt and advised on May appt.Marland KitchenMarland Kitchenpt will pickk up new sched at nxt visit

## 2014-03-20 ENCOUNTER — Telehealth: Payer: Self-pay | Admitting: Oncology

## 2014-03-20 NOTE — Telephone Encounter (Signed)
per staff message response from GM. pt to see lb/KC 6/1 and have tx 6/2. appts for lb/GM/tx 6/3 cxd. 6/9 lb/fu/tx remain. s/w pt she is aware and will get new schedule tomorrow.

## 2014-03-21 ENCOUNTER — Ambulatory Visit (HOSPITAL_BASED_OUTPATIENT_CLINIC_OR_DEPARTMENT_OTHER): Payer: Medicaid Other

## 2014-03-21 ENCOUNTER — Other Ambulatory Visit (HOSPITAL_BASED_OUTPATIENT_CLINIC_OR_DEPARTMENT_OTHER): Payer: Medicaid Other

## 2014-03-21 ENCOUNTER — Ambulatory Visit: Payer: Medicaid Other

## 2014-03-21 ENCOUNTER — Telehealth: Payer: Self-pay | Admitting: Oncology

## 2014-03-21 VITALS — BP 126/83 | HR 83 | Temp 98.2°F | Resp 20

## 2014-03-21 DIAGNOSIS — C773 Secondary and unspecified malignant neoplasm of axilla and upper limb lymph nodes: Secondary | ICD-10-CM

## 2014-03-21 DIAGNOSIS — C762 Malignant neoplasm of abdomen: Secondary | ICD-10-CM

## 2014-03-21 DIAGNOSIS — C7989 Secondary malignant neoplasm of other specified sites: Principal | ICD-10-CM

## 2014-03-21 DIAGNOSIS — C50919 Malignant neoplasm of unspecified site of unspecified female breast: Secondary | ICD-10-CM

## 2014-03-21 DIAGNOSIS — C50411 Malignant neoplasm of upper-outer quadrant of right female breast: Secondary | ICD-10-CM

## 2014-03-21 DIAGNOSIS — C787 Secondary malignant neoplasm of liver and intrahepatic bile duct: Secondary | ICD-10-CM

## 2014-03-21 DIAGNOSIS — Z5111 Encounter for antineoplastic chemotherapy: Secondary | ICD-10-CM

## 2014-03-21 DIAGNOSIS — C7960 Secondary malignant neoplasm of unspecified ovary: Secondary | ICD-10-CM

## 2014-03-21 DIAGNOSIS — C7951 Secondary malignant neoplasm of bone: Secondary | ICD-10-CM

## 2014-03-21 DIAGNOSIS — C7952 Secondary malignant neoplasm of bone marrow: Secondary | ICD-10-CM

## 2014-03-21 DIAGNOSIS — C50419 Malignant neoplasm of upper-outer quadrant of unspecified female breast: Secondary | ICD-10-CM

## 2014-03-21 DIAGNOSIS — Z95828 Presence of other vascular implants and grafts: Secondary | ICD-10-CM

## 2014-03-21 DIAGNOSIS — C786 Secondary malignant neoplasm of retroperitoneum and peritoneum: Secondary | ICD-10-CM

## 2014-03-21 LAB — TSH CHCC: TSH: 0.947 m[IU]/L (ref 0.308–3.960)

## 2014-03-21 LAB — COMPREHENSIVE METABOLIC PANEL (CC13)
ALT: 50 U/L (ref 0–55)
ANION GAP: 10 meq/L (ref 3–11)
AST: 30 U/L (ref 5–34)
Albumin: 4 g/dL (ref 3.5–5.0)
Alkaline Phosphatase: 156 U/L — ABNORMAL HIGH (ref 40–150)
BILIRUBIN TOTAL: 0.53 mg/dL (ref 0.20–1.20)
BUN: 7.8 mg/dL (ref 7.0–26.0)
CO2: 24 meq/L (ref 22–29)
Calcium: 9.6 mg/dL (ref 8.4–10.4)
Chloride: 105 mEq/L (ref 98–109)
Creatinine: 0.6 mg/dL (ref 0.6–1.1)
GLUCOSE: 100 mg/dL (ref 70–140)
Potassium: 3.8 mEq/L (ref 3.5–5.1)
Sodium: 139 mEq/L (ref 136–145)
TOTAL PROTEIN: 7.1 g/dL (ref 6.4–8.3)

## 2014-03-21 LAB — CBC WITH DIFFERENTIAL/PLATELET
BASO%: 1.1 % (ref 0.0–2.0)
Basophils Absolute: 0.1 10*3/uL (ref 0.0–0.1)
EOS ABS: 0.1 10*3/uL (ref 0.0–0.5)
EOS%: 2.9 % (ref 0.0–7.0)
HEMATOCRIT: 34.7 % — AB (ref 34.8–46.6)
HEMOGLOBIN: 11.3 g/dL — AB (ref 11.6–15.9)
LYMPH%: 41.3 % (ref 14.0–49.7)
MCH: 28.9 pg (ref 25.1–34.0)
MCHC: 32.7 g/dL (ref 31.5–36.0)
MCV: 88.3 fL (ref 79.5–101.0)
MONO#: 0.3 10*3/uL (ref 0.1–0.9)
MONO%: 6.7 % (ref 0.0–14.0)
NEUT%: 48 % (ref 38.4–76.8)
NEUTROS ABS: 2.3 10*3/uL (ref 1.5–6.5)
PLATELETS: 272 10*3/uL (ref 145–400)
RBC: 3.93 10*6/uL (ref 3.70–5.45)
RDW: 14.6 % — ABNORMAL HIGH (ref 11.2–14.5)
WBC: 4.7 10*3/uL (ref 3.9–10.3)
lymph#: 2 10*3/uL (ref 0.9–3.3)

## 2014-03-21 MED ORDER — PACLITAXEL PROTEIN-BOUND CHEMO INJECTION 100 MG
100.0000 mg/m2 | Freq: Once | INTRAVENOUS | Status: AC
  Administered 2014-03-21: 175 mg via INTRAVENOUS
  Filled 2014-03-21: qty 35

## 2014-03-21 MED ORDER — DEXAMETHASONE SODIUM PHOSPHATE 10 MG/ML IJ SOLN
INTRAMUSCULAR | Status: AC
  Filled 2014-03-21: qty 1

## 2014-03-21 MED ORDER — SODIUM CHLORIDE 0.9 % IJ SOLN
10.0000 mL | INTRAMUSCULAR | Status: DC | PRN
  Administered 2014-03-21: 10 mL
  Filled 2014-03-21: qty 10

## 2014-03-21 MED ORDER — SODIUM CHLORIDE 0.9 % IJ SOLN
10.0000 mL | INTRAMUSCULAR | Status: DC | PRN
  Administered 2014-03-21: 10 mL via INTRAVENOUS
  Filled 2014-03-21: qty 10

## 2014-03-21 MED ORDER — ONDANSETRON 8 MG/NS 50 ML IVPB
INTRAVENOUS | Status: AC
  Filled 2014-03-21: qty 8

## 2014-03-21 MED ORDER — HEPARIN SOD (PORK) LOCK FLUSH 100 UNIT/ML IV SOLN
500.0000 [IU] | Freq: Once | INTRAVENOUS | Status: AC | PRN
  Administered 2014-03-21: 500 [IU]
  Filled 2014-03-21: qty 5

## 2014-03-21 MED ORDER — SODIUM CHLORIDE 0.9 % IV SOLN
Freq: Once | INTRAVENOUS | Status: AC
  Administered 2014-03-21: 10:00:00 via INTRAVENOUS

## 2014-03-21 MED ORDER — DEXAMETHASONE SODIUM PHOSPHATE 10 MG/ML IJ SOLN
10.0000 mg | Freq: Once | INTRAMUSCULAR | Status: AC
  Administered 2014-03-21: 10 mg via INTRAVENOUS

## 2014-03-21 MED ORDER — ONDANSETRON 8 MG/50ML IVPB (CHCC)
8.0000 mg | Freq: Once | INTRAVENOUS | Status: AC
  Administered 2014-03-21: 8 mg via INTRAVENOUS

## 2014-03-21 NOTE — Telephone Encounter (Signed)
pt came in to get a copy of sch °

## 2014-03-21 NOTE — Patient Instructions (Signed)

## 2014-03-21 NOTE — Patient Instructions (Signed)

## 2014-03-30 ENCOUNTER — Ambulatory Visit (AMBULATORY_SURGERY_CENTER): Payer: Medicaid Other | Admitting: Internal Medicine

## 2014-03-30 ENCOUNTER — Encounter: Payer: Self-pay | Admitting: Internal Medicine

## 2014-03-30 VITALS — BP 105/47 | HR 83 | Temp 98.3°F | Resp 16 | Ht 63.0 in | Wt 148.0 lb

## 2014-03-30 DIAGNOSIS — K222 Esophageal obstruction: Secondary | ICD-10-CM

## 2014-03-30 DIAGNOSIS — R1310 Dysphagia, unspecified: Secondary | ICD-10-CM

## 2014-03-30 DIAGNOSIS — R1319 Other dysphagia: Secondary | ICD-10-CM

## 2014-03-30 DIAGNOSIS — R1314 Dysphagia, pharyngoesophageal phase: Secondary | ICD-10-CM

## 2014-03-30 MED ORDER — SODIUM CHLORIDE 0.9 % IV SOLN: 500.0000 mL | INTRAVENOUS | Status: DC

## 2014-03-30 NOTE — Progress Notes (Signed)
Report to PACU, RN, vss, BBS= Clear.  

## 2014-03-30 NOTE — Patient Instructions (Signed)
YOU HAD AN ENDOSCOPIC PROCEDURE TODAY AT [facility]: Refer to the procedure report that was given to you for any specific questions about what was found during the examination.  If the procedure report does not answer your questions, please call your gastroenterologist to clarify.  If you requested that your care partner not be given the details of your procedure findings, then the procedure report has been included in a sealed envelope for you to review at your convenience later.  YOU SHOULD EXPECT: Some feelings of bloating in the abdomen. Passage of more gas than usual.  Walking can help get rid of the air that was put into your GI tract during the procedure and reduce the bloating. If you had a lower endoscopy (such as a colonoscopy or flexible sigmoidoscopy) you may notice spotting of blood in your stool or on the toilet paper. If you underwent a bowel prep for your procedure, then you may not have a normal bowel movement for a few days.  DIET: FOLLOW DILATION HANDOUT.  ACTIVITY: Your care partner should take you home directly after the procedure.  You should plan to take it easy, moving slowly for the rest of the day.  You can resume normal activity the day after the procedure however you should NOT DRIVE or use heavy machinery for 24 hours (because of the sedation medicines used during the test).    SYMPTOMS TO REPORT IMMEDIATELY: A gastroenterologist can be reached at any hour.  During normal business hours, 8:30 AM to 5:00 PM Monday through Friday, call [phone].  After hours and on weekends, please call the GI answering service at [phone] who will take a message and have the physician on call contact you.    Following upper endoscopy (EGD)  Vomiting of blood or coffee ground material  New chest pain or pain under the shoulder blades  Painful or persistently difficult swallowing  New shortness of breath  Fever of 100F or higher  Black, tarry-looking  stools  FOLLOW UP: If any biopsies were taken you will be contacted by phone or by letter within the next 1-3 weeks.  Call your gastroenterologist if you have not heard about the biopsies in 3 weeks.  Our staff will call the home number listed on your records the next business day following your procedure to check on you and address any questions or concerns that you may have at that time regarding the information given to you following your procedure. This is a courtesy call and so if there is no answer at the home number and we have not heard from you through the emergency physician on call, we will assume that you have returned to your regular daily activities without incident.  SIGNATURES/CONFIDENTIALITY: You and/or your care partner have signed paperwork which will be entered into your electronic medical record.  These signatures attest to the fact that that the information above on your After Visit Summary has been reviewed and is understood.  Full responsibility of the confidentiality of this discharge information lies with you and/or your care-partner.  Resume medications. Dilation Diet Given with discharge instructions. Call office to  Schedule 2-3 week follow up with doctor.

## 2014-03-30 NOTE — Progress Notes (Signed)
Called to room to assist during endoscopic procedure.  Patient ID and intended procedure confirmed with present staff. Received instructions for my participation in the procedure from the performing physician.  

## 2014-03-30 NOTE — Op Note (Signed)
[place]  [address] [place], [id number]   ENDOSCOPY PROCEDURE REPORT  PATIENT: Tammy Blanchard, Tammy Blanchard  MR#: [id number] BIRTHDATE: 12-09-1965 , 49  yrs. old GENDER: Female ENDOSCOPIST: Jerene Bears, MD REFERRED BY:  Wilmon Arms, MD PROCEDURE DATE:  03/30/2014 PROCEDURE:  Esophagoscopy with balloon dilation ASA CLASS:       ASA Class III INDICATIONS:  Dysphagia.   abnormal barium swallow results.  Hx of breast cancer treated with chemotherapy and radiation MEDICATIONS: MAC sedation, administered by CRNA, Glycopyrrolate (Robinul) 0.2 mg IV, and propofol (Diprivan) 150mg  IV TOPICAL ANESTHETIC: Cetacaine Spray  DESCRIPTION OF PROCEDURE: After the risks benefits and alternatives of the procedure were thoroughly explained, informed consent was obtained.  The LB [id number] endoscope was introduced through the mouth and advanced to the esophagus mid. Without limitations.  The instrument was slowly withdrawn as the mucosa was fully examined.        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  The stenosis was non traversable with the standard adult upper endoscope, but appears rather focal.  There is no evidence of esophagitis.  After review of the barium swallow giving visualization of the distal esophagus and proximal stomach, balloon dilation (TTS) was performed across the stricture to 10, 11, and 12 mm.  This was well tolerated, no evidence of mucosal tear.  STOMACH and DUODENUM: Not visualized today  Retroflexion was not performed.     The scope was then withdrawn from the patient and the procedure completed.  COMPLICATIONS: There were no complications.  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  Soft diet 2.  Return to clinic in 2-3 weeks.  If symptoms persist can repeat dilation (in hospital setting  for availability of pediatric upper endoscope).   eSigned:  Jerene Bears, MD 03/30/2014 9:19 AM   CC:The Patient and Angelica Chessman MD; Wilmon Arms, MD

## 2014-03-31 ENCOUNTER — Telehealth: Payer: Self-pay | Admitting: *Deleted

## 2014-03-31 NOTE — Telephone Encounter (Signed)
  Follow up Call-  Call back number 03/30/2014  Post procedure Call Back phone  # [phone]  Permission to leave phone message Yes     Patient questions:  Do you have a fever, pain , or abdominal swelling? no Pain Score  0 *  Have you tolerated food without any problems? yes  Have you been able to return to your normal activities? yes  Do you have any questions about your discharge instructions: Diet   no Medications  no Follow up visit  no  Do you have questions or concerns about your Care? no  Actions: * If pain score is 4 or above: No action needed, pain <4.

## 2014-04-03 ENCOUNTER — Ambulatory Visit (HOSPITAL_BASED_OUTPATIENT_CLINIC_OR_DEPARTMENT_OTHER): Payer: Medicaid Other | Admitting: Oncology

## 2014-04-03 ENCOUNTER — Other Ambulatory Visit: Payer: Self-pay | Admitting: *Deleted

## 2014-04-03 ENCOUNTER — Telehealth: Payer: Self-pay | Admitting: *Deleted

## 2014-04-03 ENCOUNTER — Other Ambulatory Visit: Payer: Self-pay | Admitting: Oncology

## 2014-04-03 ENCOUNTER — Telehealth: Payer: Self-pay | Admitting: Oncology

## 2014-04-03 ENCOUNTER — Other Ambulatory Visit (HOSPITAL_BASED_OUTPATIENT_CLINIC_OR_DEPARTMENT_OTHER): Payer: Medicaid Other

## 2014-04-03 ENCOUNTER — Encounter: Payer: Self-pay | Admitting: Oncology

## 2014-04-03 ENCOUNTER — Ambulatory Visit (HOSPITAL_BASED_OUTPATIENT_CLINIC_OR_DEPARTMENT_OTHER): Payer: Medicaid Other

## 2014-04-03 VITALS — BP 132/84 | HR 88 | Temp 99.1°F | Resp 20 | Ht 63.0 in | Wt 152.1 lb

## 2014-04-03 DIAGNOSIS — C7952 Secondary malignant neoplasm of bone marrow: Secondary | ICD-10-CM

## 2014-04-03 DIAGNOSIS — C50919 Malignant neoplasm of unspecified site of unspecified female breast: Secondary | ICD-10-CM

## 2014-04-03 DIAGNOSIS — Z452 Encounter for adjustment and management of vascular access device: Secondary | ICD-10-CM

## 2014-04-03 DIAGNOSIS — T451X5A Adverse effect of antineoplastic and immunosuppressive drugs, initial encounter: Secondary | ICD-10-CM

## 2014-04-03 DIAGNOSIS — C50419 Malignant neoplasm of upper-outer quadrant of unspecified female breast: Secondary | ICD-10-CM

## 2014-04-03 DIAGNOSIS — C7951 Secondary malignant neoplasm of bone: Secondary | ICD-10-CM

## 2014-04-03 DIAGNOSIS — C787 Secondary malignant neoplasm of liver and intrahepatic bile duct: Secondary | ICD-10-CM

## 2014-04-03 DIAGNOSIS — C7989 Secondary malignant neoplasm of other specified sites: Secondary | ICD-10-CM

## 2014-04-03 DIAGNOSIS — C762 Malignant neoplasm of abdomen: Secondary | ICD-10-CM

## 2014-04-03 DIAGNOSIS — C779 Secondary and unspecified malignant neoplasm of lymph node, unspecified: Secondary | ICD-10-CM

## 2014-04-03 DIAGNOSIS — R1310 Dysphagia, unspecified: Secondary | ICD-10-CM

## 2014-04-03 DIAGNOSIS — Z95828 Presence of other vascular implants and grafts: Secondary | ICD-10-CM

## 2014-04-03 DIAGNOSIS — C773 Secondary and unspecified malignant neoplasm of axilla and upper limb lymph nodes: Secondary | ICD-10-CM

## 2014-04-03 DIAGNOSIS — C50411 Malignant neoplasm of upper-outer quadrant of right female breast: Secondary | ICD-10-CM

## 2014-04-03 DIAGNOSIS — D6481 Anemia due to antineoplastic chemotherapy: Secondary | ICD-10-CM

## 2014-04-03 DIAGNOSIS — E876 Hypokalemia: Secondary | ICD-10-CM

## 2014-04-03 LAB — CBC WITH DIFFERENTIAL/PLATELET
BASO%: 0.6 % (ref 0.0–2.0)
BASOS ABS: 0 10*3/uL (ref 0.0–0.1)
EOS%: 2.6 % (ref 0.0–7.0)
Eosinophils Absolute: 0.1 10*3/uL (ref 0.0–0.5)
HEMATOCRIT: 29.9 % — AB (ref 34.8–46.6)
HEMOGLOBIN: 9.6 g/dL — AB (ref 11.6–15.9)
LYMPH#: 1.6 10*3/uL (ref 0.9–3.3)
LYMPH%: 34.8 % (ref 14.0–49.7)
MCH: 29 pg (ref 25.1–34.0)
MCHC: 32.2 g/dL (ref 31.5–36.0)
MCV: 89.9 fL (ref 79.5–101.0)
MONO#: 0.4 10*3/uL (ref 0.1–0.9)
MONO%: 8.8 % (ref 0.0–14.0)
NEUT%: 53.2 % (ref 38.4–76.8)
NEUTROS ABS: 2.5 10*3/uL (ref 1.5–6.5)
Platelets: 236 10*3/uL (ref 145–400)
RBC: 3.33 10*6/uL — ABNORMAL LOW (ref 3.70–5.45)
RDW: 15.1 % — AB (ref 11.2–14.5)
WBC: 4.7 10*3/uL (ref 3.9–10.3)

## 2014-04-03 LAB — COMPREHENSIVE METABOLIC PANEL (CC13)
ALT: 31 U/L (ref 0–55)
AST: 17 U/L (ref 5–34)
Albumin: 3.2 g/dL — ABNORMAL LOW (ref 3.5–5.0)
Alkaline Phosphatase: 116 U/L (ref 40–150)
Anion Gap: 12 mEq/L — ABNORMAL HIGH (ref 3–11)
BUN: 5.8 mg/dL — AB (ref 7.0–26.0)
CALCIUM: 7.9 mg/dL — AB (ref 8.4–10.4)
CHLORIDE: 116 meq/L — AB (ref 98–109)
CO2: 14 mEq/L — ABNORMAL LOW (ref 22–29)
Creatinine: 0.5 mg/dL — ABNORMAL LOW (ref 0.6–1.1)
GLUCOSE: 94 mg/dL (ref 70–140)
Potassium: 2.9 mEq/L — CL (ref 3.5–5.1)
Sodium: 142 mEq/L (ref 136–145)
Total Bilirubin: 0.49 mg/dL (ref 0.20–1.20)
Total Protein: 5.8 g/dL — ABNORMAL LOW (ref 6.4–8.3)

## 2014-04-03 MED ORDER — POTASSIUM CHLORIDE ER 10 MEQ PO CPCR: 20.0000 meq | ORAL_CAPSULE | Freq: Two times a day (BID) | ORAL | Status: DC

## 2014-04-03 MED ORDER — SODIUM CHLORIDE 0.9 % IJ SOLN
10.0000 mL | INTRAMUSCULAR | Status: DC | PRN
Start: 2014-04-03 — End: 2014-04-03
  Administered 2014-04-03: 10 mL via INTRAVENOUS
  Filled 2014-04-03: qty 10

## 2014-04-03 MED ORDER — HEPARIN SOD (PORK) LOCK FLUSH 100 UNIT/ML IV SOLN
500.0000 [IU] | Freq: Once | INTRAVENOUS | Status: AC
  Administered 2014-04-03: 500 [IU] via INTRAVENOUS
  Filled 2014-04-03: qty 5

## 2014-04-03 NOTE — Telephone Encounter (Signed)
This RN left message on pt's vm stating need for potassium replacement per lab today and prescription will be called in to her pharmacy.  Lab will be repeated tomorrow at appointment.
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
## 2014-04-03 NOTE — Telephone Encounter (Signed)
ADDED LAB TO 6/2 TX PER 6/1 POF. LMONVM FOR PT RE NEW TIME FOR 6/2 @ 1PM. NO OTHER ORDERS PER 6/1 POF.

## 2014-04-03 NOTE — Telephone Encounter (Signed)
Attempting to call patient with specific instructions for abnormal labs. Unable to identify voicemail as Tammy Blanchard, left message to call me back.

## 2014-04-03 NOTE — Patient Instructions (Signed)

## 2014-04-04 ENCOUNTER — Ambulatory Visit (HOSPITAL_BASED_OUTPATIENT_CLINIC_OR_DEPARTMENT_OTHER): Payer: Medicaid Other

## 2014-04-04 ENCOUNTER — Telehealth: Payer: Self-pay | Admitting: Oncology

## 2014-04-04 VITALS — BP 126/86 | HR 85 | Temp 98.0°F | Resp 18

## 2014-04-04 DIAGNOSIS — C7989 Secondary malignant neoplasm of other specified sites: Principal | ICD-10-CM

## 2014-04-04 DIAGNOSIS — C7952 Secondary malignant neoplasm of bone marrow: Secondary | ICD-10-CM

## 2014-04-04 DIAGNOSIS — C50919 Malignant neoplasm of unspecified site of unspecified female breast: Secondary | ICD-10-CM

## 2014-04-04 DIAGNOSIS — C50411 Malignant neoplasm of upper-outer quadrant of right female breast: Secondary | ICD-10-CM

## 2014-04-04 DIAGNOSIS — C7951 Secondary malignant neoplasm of bone: Secondary | ICD-10-CM

## 2014-04-04 DIAGNOSIS — C50419 Malignant neoplasm of upper-outer quadrant of unspecified female breast: Secondary | ICD-10-CM

## 2014-04-04 DIAGNOSIS — Z5111 Encounter for antineoplastic chemotherapy: Secondary | ICD-10-CM

## 2014-04-04 DIAGNOSIS — D6481 Anemia due to antineoplastic chemotherapy: Secondary | ICD-10-CM

## 2014-04-04 DIAGNOSIS — C762 Malignant neoplasm of abdomen: Secondary | ICD-10-CM

## 2014-04-04 DIAGNOSIS — C773 Secondary and unspecified malignant neoplasm of axilla and upper limb lymph nodes: Secondary | ICD-10-CM

## 2014-04-04 DIAGNOSIS — T451X5A Adverse effect of antineoplastic and immunosuppressive drugs, initial encounter: Secondary | ICD-10-CM

## 2014-04-04 DIAGNOSIS — Z95828 Presence of other vascular implants and grafts: Secondary | ICD-10-CM

## 2014-04-04 LAB — COMPREHENSIVE METABOLIC PANEL (CC13)
ALT: 34 U/L (ref 0–55)
AST: 21 U/L (ref 5–34)
Albumin: 3.9 g/dL (ref 3.5–5.0)
Alkaline Phosphatase: 145 U/L (ref 40–150)
Anion Gap: 12 mEq/L — ABNORMAL HIGH (ref 3–11)
BILIRUBIN TOTAL: 0.55 mg/dL (ref 0.20–1.20)
BUN: 8.7 mg/dL (ref 7.0–26.0)
CO2: 20 mEq/L — ABNORMAL LOW (ref 22–29)
CREATININE: 0.6 mg/dL (ref 0.6–1.1)
Calcium: 9.7 mg/dL (ref 8.4–10.4)
Chloride: 105 mEq/L (ref 98–109)
Glucose: 87 mg/dl (ref 70–140)
Potassium: 3.7 mEq/L (ref 3.5–5.1)
Sodium: 138 mEq/L (ref 136–145)
Total Protein: 7 g/dL (ref 6.4–8.3)

## 2014-04-04 MED ORDER — HEPARIN SOD (PORK) LOCK FLUSH 100 UNIT/ML IV SOLN
500.0000 [IU] | Freq: Once | INTRAVENOUS | Status: AC | PRN
  Administered 2014-04-04: 500 [IU]
  Filled 2014-04-04: qty 5

## 2014-04-04 MED ORDER — SODIUM CHLORIDE 0.9 % IV SOLN
Freq: Once | INTRAVENOUS | Status: AC
  Administered 2014-04-04: 14:00:00 via INTRAVENOUS

## 2014-04-04 MED ORDER — ONDANSETRON 8 MG/NS 50 ML IVPB
INTRAVENOUS | Status: AC
  Filled 2014-04-04: qty 8

## 2014-04-04 MED ORDER — HEPARIN SOD (PORK) LOCK FLUSH 100 UNIT/ML IV SOLN
500.0000 [IU] | Freq: Once | INTRAVENOUS | Status: AC
  Administered 2014-04-04: 500 [IU] via INTRAVENOUS
  Filled 2014-04-04: qty 5

## 2014-04-04 MED ORDER — SODIUM CHLORIDE 0.9 % IJ SOLN
10.0000 mL | INTRAMUSCULAR | Status: DC | PRN
  Administered 2014-04-04: 10 mL via INTRAVENOUS
  Filled 2014-04-04: qty 10

## 2014-04-04 MED ORDER — DEXAMETHASONE SODIUM PHOSPHATE 10 MG/ML IJ SOLN
INTRAMUSCULAR | Status: AC
  Filled 2014-04-04: qty 1

## 2014-04-04 MED ORDER — ONDANSETRON 8 MG/50ML IVPB (CHCC)
8.0000 mg | Freq: Once | INTRAVENOUS | Status: AC
  Administered 2014-04-04: 8 mg via INTRAVENOUS

## 2014-04-04 MED ORDER — SODIUM CHLORIDE 0.9 % IJ SOLN
10.0000 mL | INTRAMUSCULAR | Status: DC | PRN
  Administered 2014-04-04: 10 mL
  Filled 2014-04-04: qty 10

## 2014-04-04 MED ORDER — ZOLEDRONIC ACID 4 MG/100ML IV SOLN
4.0000 mg | Freq: Once | INTRAVENOUS | Status: AC
  Administered 2014-04-04: 4 mg via INTRAVENOUS
  Filled 2014-04-04: qty 100

## 2014-04-04 MED ORDER — PACLITAXEL PROTEIN-BOUND CHEMO INJECTION 100 MG
100.0000 mg/m2 | Freq: Once | INTRAVENOUS | Status: AC
  Administered 2014-04-04: 175 mg via INTRAVENOUS
  Filled 2014-04-04: qty 35

## 2014-04-04 MED ORDER — DEXAMETHASONE SODIUM PHOSPHATE 10 MG/ML IJ SOLN
10.0000 mg | Freq: Once | INTRAMUSCULAR | Status: AC
  Administered 2014-04-04: 10 mg via INTRAVENOUS

## 2014-04-04 NOTE — Telephone Encounter (Signed)
Spoke with patient in infusion regarding her labs. K+ and calcium levels are normal today. Lab results yesterday were likely inaccurate. Instructed her not to take the K+ supplement that was called in for her. Patient indicated that she did not receive our message yesterday and did not know about the K+.

## 2014-04-04 NOTE — Patient Instructions (Signed)
[facility] Discharge Instructions for Patients Receiving Chemotherapy  Today you received the following chemotherapy agents Abraxane  To help prevent nausea and vomiting after your treatment, we encourage you to take your nausea medication as prescribed.   If you develop nausea and vomiting that is not controlled by your nausea medication, call the clinic.   BELOW ARE SYMPTOMS THAT SHOULD BE REPORTED IMMEDIATELY:  *FEVER GREATER THAN 100.5 F  *CHILLS WITH OR WITHOUT FEVER  NAUSEA AND VOMITING THAT IS NOT CONTROLLED WITH YOUR NAUSEA MEDICATION  *UNUSUAL SHORTNESS OF BREATH  *UNUSUAL BRUISING OR BLEEDING  TENDERNESS IN MOUTH AND THROAT WITH OR WITHOUT PRESENCE OF ULCERS  *URINARY PROBLEMS  *BOWEL PROBLEMS  UNUSUAL RASH Items with * indicate a potential emergency and should be followed up as soon as possible.  Feel free to call the clinic you have any questions or concerns. The clinic phone number is [phone].    

## 2014-04-05 ENCOUNTER — Ambulatory Visit: Payer: Medicaid Other

## 2014-04-05 ENCOUNTER — Other Ambulatory Visit: Payer: Medicaid Other

## 2014-04-05 ENCOUNTER — Ambulatory Visit: Payer: Medicaid Other | Admitting: Oncology

## 2014-04-10 ENCOUNTER — Encounter: Payer: Self-pay | Admitting: Internal Medicine

## 2014-04-10 ENCOUNTER — Ambulatory Visit: Payer: Medicaid Other | Attending: Internal Medicine | Admitting: Internal Medicine

## 2014-04-10 VITALS — BP 108/78 | HR 93 | Temp 98.0°F | Resp 16 | Ht 63.0 in | Wt 150.0 lb

## 2014-04-10 DIAGNOSIS — K219 Gastro-esophageal reflux disease without esophagitis: Secondary | ICD-10-CM | POA: Insufficient documentation

## 2014-04-10 DIAGNOSIS — J029 Acute pharyngitis, unspecified: Secondary | ICD-10-CM

## 2014-04-10 DIAGNOSIS — Z170 Estrogen receptor positive status [ER+]: Secondary | ICD-10-CM | POA: Diagnosis not present

## 2014-04-10 DIAGNOSIS — C801 Malignant (primary) neoplasm, unspecified: Secondary | ICD-10-CM | POA: Diagnosis not present

## 2014-04-10 DIAGNOSIS — B9789 Other viral agents as the cause of diseases classified elsewhere: Secondary | ICD-10-CM | POA: Insufficient documentation

## 2014-04-10 DIAGNOSIS — C50919 Malignant neoplasm of unspecified site of unspecified female breast: Secondary | ICD-10-CM | POA: Diagnosis not present

## 2014-04-10 DIAGNOSIS — I10 Essential (primary) hypertension: Secondary | ICD-10-CM | POA: Insufficient documentation

## 2014-04-10 DIAGNOSIS — J028 Acute pharyngitis due to other specified organisms: Secondary | ICD-10-CM

## 2014-04-10 LAB — POCT RAPID STREP A (OFFICE): Rapid Strep A Screen: NEGATIVE

## 2014-04-10 MED ORDER — CLONIDINE HCL 0.2 MG PO TABS: 0.2000 mg | ORAL_TABLET | Freq: Every day | ORAL | Status: DC

## 2014-04-10 MED ORDER — AMLODIPINE BESYLATE 10 MG PO TABS: 10.0000 mg | ORAL_TABLET | Freq: Every morning | ORAL | Status: DC

## 2014-04-10 NOTE — Patient Instructions (Signed)

## 2014-04-10 NOTE — Progress Notes (Signed)
Patient ID: Tammy Blanchard, female   DOB: 09/09/1966, 48 y.o.   MRN: [id number]   Tammy Blanchard, is a 48 y.o. female  HBZ:[id number]  BOF:[id number]  DOB - 1966-09-29  Chief Complaint  Patient presents with  . Follow-up        Subjective:   Ahsley Blanchard is a 48 y.o. female here today for a follow up visit. Patient has history of hypertension, metastatic breast cancer currently under treatment with his neoadjuvant chemotherapy, here today for followup on hypertension. She claimed blood pressure has been controlled. She has no complaints today. She is compliant with her medications. She remains positive on her breast cancer treatment. She does not smoke cigarette, she does not drink alcohol. Patient has No headache, No chest pain, No abdominal pain - No Nausea, No new weakness tingling or numbness, No Cough - SOB.  Problem  Htn (Hypertension)    ALLERGIES: Allergies  Allergen Reactions  . Metronidazole Swelling  . Tramadol Nausea Only    PAST MEDICAL HISTORY: Past Medical History  Diagnosis Date  . Hypertension   . Allergy   . GERD (gastroesophageal reflux disease)   . Thyroid disease   . Anemia     resolved 2011  . Hyperlipidemia     controlled  . History of blood transfusion 2009    [facility] -  UNKNOWN NUMBER OF UNITS TRANSFUSED  . Breast cancer 10/2009, 2014    ER+/PR+/Her2-    MEDICATIONS AT HOME: Prior to Admission medications   Medication Sig Start Date End Date Taking? Authorizing Provider  amLODipine (NORVASC) 10 MG tablet Take 1 tablet (10 mg total) by mouth every morning. 04/10/14  Yes Angelica Chessman, MD  calcium carbonate (TUMS - DOSED IN MG ELEMENTAL CALCIUM) 500 MG chewable tablet Chew 1 tablet by mouth daily as needed for indigestion.  03/29/13  Yes Chauncey Cruel, MD  cloNIDine (CATAPRES) 0.2 MG tablet Take 1 tablet (0.2 mg total) by mouth at bedtime. 04/10/14  Yes Angelica Chessman, MD  HYDROcodone-acetaminophen (NORCO) 7.5-325 MG per tablet Take 1-2 tablets  by mouth every 8 (eight) hours as needed for moderate pain.   Yes Historical Provider, MD  ibuprofen (ADVIL,MOTRIN) 200 MG tablet Take 400 mg by mouth every 8 (eight) hours as needed.   Yes Historical Provider, MD  Lactulose SOLN Take 20 mLs by mouth at bedtime as needed. 02/28/14  Yes Wilmon Arms, MD  lidocaine-prilocaine (EMLA) cream Apply 1 application topically as needed. Apply over portacath  1 1/2 hours to 2 hours prior to procedures as needed. 02/22/14  Yes Amy Milda Smart, PA-C  losartan (COZAAR) 100 MG tablet Take 100 mg by mouth every morning.   Yes Historical Provider, MD  omeprazole (PRILOSEC) 40 MG capsule Take 40 mg by mouth daily.   Yes Historical Provider, MD  ondansetron (ZOFRAN) 8 MG tablet Take 1 tablet (8 mg total) by mouth 2 (two) times daily. Start the day after chemo for 2 days. Then take as needed for nausea or vomiting. 02/08/14  Yes Chauncey Cruel, MD  Zoledronic Acid (ZOMETA IV) Inject 4 mg into the vein every 6 (six) weeks.  03/17/13  Yes Chauncey Cruel, MD  Alum & Mag Hydroxide-Simeth (MAGIC MOUTHWASH W/LIDOCAINE) SOLN Take 5 mLs by mouth 4 (four) times daily. 03/07/14   Wilmon Arms, MD  LORazepam (ATIVAN) 0.5 MG tablet Take 1 tablet (0.5 mg total) by mouth every 8 (eight) hours as needed for anxiety (or insomnia). 01/09/14   Reyne Dumas, MD  pantoprazole (PROTONIX) 40 MG tablet Take 1 tablet (40 mg total) by mouth daily. 03/13/14   Amy S Esterwood, PA-C  potassium chloride (MICRO-K) 10 MEQ CR capsule Take 2 capsules (20 mEq total) by mouth 2 (two) times daily. 04/03/14   Chauncey Cruel, MD  prochlorperazine (COMPAZINE) 10 MG tablet Take 10 mg by mouth every 6 (six) hours as needed for nausea or vomiting.    Historical Provider, MD     Objective:   Filed Vitals:   04/10/14 1012  BP: 108/78  Pulse: 93  Temp: 98 F (36.7 C)  TempSrc: Oral  Resp: 16  Height: '5\' 3"'  (1.6 m)  Weight: 150 lb (68.04 kg)  SpO2: 97%    Exam General appearance : Awake, alert, not in  any distress. Speech Clear. Not toxic looking HEENT: Atraumatic and Normocephalic, pupils equally reactive to light and accomodation Neck: supple, no JVD. No cervical lymphadenopathy.  Chest:Good air entry bilaterally, no added sounds  CVS: S1 S2 regular, no murmurs.  Abdomen: Bowel sounds present, Non tender and not distended with no gaurding, rigidity or rebound. Extremities: B/L Lower Ext shows no edema, both legs are warm to touch Neurology: Awake alert, and oriented X 3, CN II-XII intact, Non focal Skin:No Rash Wounds:N/A  Data Review No results found for this basename: HGBA1C     Assessment & Plan   1. HTN (hypertension) Refill - amLODipine (NORVASC) 10 MG tablet; Take 1 tablet (10 mg total) by mouth every morning.  Dispense: 90 tablet; Refill: 3 - cloNIDine (CATAPRES) 0.2 MG tablet; Take 1 tablet (0.2 mg total) by mouth at bedtime.  Dispense: 90 tablet; Refill: 3 - DASH diet  Follow-up with oncologist as per schedule  Patient was counseled extensively on nutrition and exercise.  Return in about 3 months (around 07/11/2014) for Follow up HTN, Follow up Pain and comorbidities.  The patient was given clear instructions to go to ER or return to medical center if symptoms don't improve, worsen or new problems develop. The patient verbalized understanding. The patient was told to call to get lab results if they haven't heard anything in the next week.   This note has been created with Surveyor, quantity. Any transcriptional errors are unintentional.    Angelica Chessman, MD, [place], [place], [place] and [facility] [place], [place]   04/10/2014, 10:50 AM

## 2014-04-10 NOTE — Progress Notes (Signed)
Pt is here following up on her HTN and anxiety. Pt has been around children with strep and wants to be tested.

## 2014-04-11 ENCOUNTER — Ambulatory Visit: Payer: Medicaid Other

## 2014-04-11 ENCOUNTER — Other Ambulatory Visit (HOSPITAL_BASED_OUTPATIENT_CLINIC_OR_DEPARTMENT_OTHER): Payer: Medicaid Other

## 2014-04-11 ENCOUNTER — Ambulatory Visit (HOSPITAL_BASED_OUTPATIENT_CLINIC_OR_DEPARTMENT_OTHER): Payer: Medicaid Other | Admitting: Physician Assistant

## 2014-04-11 ENCOUNTER — Other Ambulatory Visit: Payer: Self-pay | Admitting: *Deleted

## 2014-04-11 ENCOUNTER — Telehealth: Payer: Self-pay | Admitting: Oncology

## 2014-04-11 ENCOUNTER — Encounter: Payer: Self-pay | Admitting: Physician Assistant

## 2014-04-11 ENCOUNTER — Ambulatory Visit (HOSPITAL_BASED_OUTPATIENT_CLINIC_OR_DEPARTMENT_OTHER): Payer: Medicaid Other

## 2014-04-11 ENCOUNTER — Telehealth: Payer: Self-pay | Admitting: *Deleted

## 2014-04-11 VITALS — BP 122/87 | HR 90 | Temp 98.4°F | Resp 18 | Ht 63.0 in | Wt 150.3 lb

## 2014-04-11 DIAGNOSIS — F329 Major depressive disorder, single episode, unspecified: Secondary | ICD-10-CM

## 2014-04-11 DIAGNOSIS — C773 Secondary and unspecified malignant neoplasm of axilla and upper limb lymph nodes: Secondary | ICD-10-CM

## 2014-04-11 DIAGNOSIS — C762 Malignant neoplasm of abdomen: Secondary | ICD-10-CM

## 2014-04-11 DIAGNOSIS — K219 Gastro-esophageal reflux disease without esophagitis: Secondary | ICD-10-CM

## 2014-04-11 DIAGNOSIS — Z5111 Encounter for antineoplastic chemotherapy: Secondary | ICD-10-CM

## 2014-04-11 DIAGNOSIS — C792 Secondary malignant neoplasm of skin: Secondary | ICD-10-CM

## 2014-04-11 DIAGNOSIS — C50419 Malignant neoplasm of upper-outer quadrant of unspecified female breast: Secondary | ICD-10-CM

## 2014-04-11 DIAGNOSIS — F3289 Other specified depressive episodes: Secondary | ICD-10-CM

## 2014-04-11 DIAGNOSIS — C7951 Secondary malignant neoplasm of bone: Secondary | ICD-10-CM

## 2014-04-11 DIAGNOSIS — C50411 Malignant neoplasm of upper-outer quadrant of right female breast: Secondary | ICD-10-CM

## 2014-04-11 DIAGNOSIS — C7960 Secondary malignant neoplasm of unspecified ovary: Secondary | ICD-10-CM

## 2014-04-11 DIAGNOSIS — D509 Iron deficiency anemia, unspecified: Secondary | ICD-10-CM

## 2014-04-11 DIAGNOSIS — C7989 Secondary malignant neoplasm of other specified sites: Principal | ICD-10-CM

## 2014-04-11 DIAGNOSIS — C779 Secondary and unspecified malignant neoplasm of lymph node, unspecified: Secondary | ICD-10-CM

## 2014-04-11 DIAGNOSIS — R1310 Dysphagia, unspecified: Secondary | ICD-10-CM

## 2014-04-11 DIAGNOSIS — C7952 Secondary malignant neoplasm of bone marrow: Secondary | ICD-10-CM

## 2014-04-11 DIAGNOSIS — C787 Secondary malignant neoplasm of liver and intrahepatic bile duct: Secondary | ICD-10-CM

## 2014-04-11 DIAGNOSIS — C50919 Malignant neoplasm of unspecified site of unspecified female breast: Secondary | ICD-10-CM

## 2014-04-11 DIAGNOSIS — T451X5A Adverse effect of antineoplastic and immunosuppressive drugs, initial encounter: Secondary | ICD-10-CM

## 2014-04-11 DIAGNOSIS — D6481 Anemia due to antineoplastic chemotherapy: Secondary | ICD-10-CM

## 2014-04-11 DIAGNOSIS — R61 Generalized hyperhidrosis: Secondary | ICD-10-CM

## 2014-04-11 DIAGNOSIS — K5900 Constipation, unspecified: Secondary | ICD-10-CM

## 2014-04-11 DIAGNOSIS — Z95828 Presence of other vascular implants and grafts: Secondary | ICD-10-CM

## 2014-04-11 DIAGNOSIS — E876 Hypokalemia: Secondary | ICD-10-CM

## 2014-04-11 DIAGNOSIS — I10 Essential (primary) hypertension: Secondary | ICD-10-CM

## 2014-04-11 LAB — CBC WITH DIFFERENTIAL/PLATELET
BASO%: 0.8 % (ref 0.0–2.0)
BASOS ABS: 0.1 10*3/uL (ref 0.0–0.1)
EOS ABS: 0.2 10*3/uL (ref 0.0–0.5)
EOS%: 3 % (ref 0.0–7.0)
HEMATOCRIT: 33.1 % — AB (ref 34.8–46.6)
HEMOGLOBIN: 10.9 g/dL — AB (ref 11.6–15.9)
LYMPH%: 38.3 % (ref 14.0–49.7)
MCH: 29.3 pg (ref 25.1–34.0)
MCHC: 33 g/dL (ref 31.5–36.0)
MCV: 88.6 fL (ref 79.5–101.0)
MONO#: 0.4 10*3/uL (ref 0.1–0.9)
MONO%: 5.8 % (ref 0.0–14.0)
NEUT%: 52.1 % (ref 38.4–76.8)
NEUTROS ABS: 3.4 10*3/uL (ref 1.5–6.5)
PLATELETS: 319 10*3/uL (ref 145–400)
RBC: 3.74 10*6/uL (ref 3.70–5.45)
RDW: 15.5 % — ABNORMAL HIGH (ref 11.2–14.5)
WBC: 6.5 10*3/uL (ref 3.9–10.3)
lymph#: 2.5 10*3/uL (ref 0.9–3.3)

## 2014-04-11 LAB — COMPREHENSIVE METABOLIC PANEL (CC13)
ALBUMIN: 3.9 g/dL (ref 3.5–5.0)
ALT: 72 U/L — ABNORMAL HIGH (ref 0–55)
AST: 30 U/L (ref 5–34)
Alkaline Phosphatase: 141 U/L (ref 40–150)
Anion Gap: 7 mEq/L (ref 3–11)
BUN: 9.1 mg/dL (ref 7.0–26.0)
CO2: 24 mEq/L (ref 22–29)
Calcium: 9.7 mg/dL (ref 8.4–10.4)
Chloride: 109 mEq/L (ref 98–109)
Creatinine: 0.6 mg/dL (ref 0.6–1.1)
Glucose: 95 mg/dl (ref 70–140)
POTASSIUM: 4 meq/L (ref 3.5–5.1)
Sodium: 139 mEq/L (ref 136–145)
Total Bilirubin: 0.55 mg/dL (ref 0.20–1.20)
Total Protein: 7.1 g/dL (ref 6.4–8.3)

## 2014-04-11 LAB — TECHNOLOGIST REVIEW

## 2014-04-11 MED ORDER — SODIUM CHLORIDE 0.9 % IJ SOLN
10.0000 mL | INTRAMUSCULAR | Status: DC | PRN
  Filled 2014-04-11: qty 10

## 2014-04-11 MED ORDER — SODIUM CHLORIDE 0.9 % IJ SOLN
3.0000 mL | Freq: Once | INTRAMUSCULAR | Status: DC | PRN
  Filled 2014-04-11: qty 10

## 2014-04-11 MED ORDER — ZOLEDRONIC ACID 4 MG/5ML IV CONC: 4.0000 mg | Freq: Once | INTRAVENOUS | Status: DC

## 2014-04-11 MED ORDER — DEXAMETHASONE SODIUM PHOSPHATE 10 MG/ML IJ SOLN
10.0000 mg | Freq: Once | INTRAMUSCULAR | Status: AC
  Administered 2014-04-11: 10 mg via INTRAVENOUS

## 2014-04-11 MED ORDER — PANTOPRAZOLE SODIUM 40 MG PO TBEC: 40.0000 mg | DELAYED_RELEASE_TABLET | Freq: Every day | ORAL | Status: DC

## 2014-04-11 MED ORDER — ONDANSETRON 8 MG/NS 50 ML IVPB
INTRAVENOUS | Status: AC
  Filled 2014-04-11: qty 8

## 2014-04-11 MED ORDER — SODIUM CHLORIDE 0.9 % IJ SOLN
10.0000 mL | INTRAMUSCULAR | Status: DC | PRN
  Administered 2014-04-11: 10 mL
  Filled 2014-04-11: qty 10

## 2014-04-11 MED ORDER — SODIUM CHLORIDE 0.9 % IJ SOLN
10.0000 mL | INTRAMUSCULAR | Status: DC | PRN
  Administered 2014-04-11: 10 mL via INTRAVENOUS
  Filled 2014-04-11: qty 10

## 2014-04-11 MED ORDER — SODIUM CHLORIDE 0.9 % IV SOLN
Freq: Once | INTRAVENOUS | Status: AC
  Administered 2014-04-11: 12:00:00 via INTRAVENOUS

## 2014-04-11 MED ORDER — PACLITAXEL PROTEIN-BOUND CHEMO INJECTION 100 MG
100.0000 mg/m2 | Freq: Once | INTRAVENOUS | Status: AC
  Administered 2014-04-11: 175 mg via INTRAVENOUS
  Filled 2014-04-11: qty 35

## 2014-04-11 MED ORDER — HEPARIN SOD (PORK) LOCK FLUSH 100 UNIT/ML IV SOLN
500.0000 [IU] | Freq: Once | INTRAVENOUS | Status: DC | PRN
  Filled 2014-04-11: qty 5

## 2014-04-11 MED ORDER — HEPARIN SOD (PORK) LOCK FLUSH 100 UNIT/ML IV SOLN
500.0000 [IU] | Freq: Once | INTRAVENOUS | Status: AC | PRN
  Administered 2014-04-11: 500 [IU]
  Filled 2014-04-11: qty 5

## 2014-04-11 MED ORDER — ALTEPLASE 2 MG IJ SOLR
2.0000 mg | Freq: Once | INTRAMUSCULAR | Status: DC | PRN
  Filled 2014-04-11: qty 2

## 2014-04-11 MED ORDER — DEXAMETHASONE SODIUM PHOSPHATE 10 MG/ML IJ SOLN
INTRAMUSCULAR | Status: AC
  Filled 2014-04-11: qty 1

## 2014-04-11 MED ORDER — ONDANSETRON 8 MG/50ML IVPB (CHCC)
8.0000 mg | Freq: Once | INTRAVENOUS | Status: AC
  Administered 2014-04-11: 8 mg via INTRAVENOUS

## 2014-04-11 MED ORDER — HEPARIN SOD (PORK) LOCK FLUSH 100 UNIT/ML IV SOLN
250.0000 [IU] | Freq: Once | INTRAVENOUS | Status: DC | PRN
  Filled 2014-04-11: qty 5

## 2014-04-11 NOTE — Telephone Encounter (Signed)
Per staff phone call and POF I have schedueld appts.  JMW  

## 2014-04-11 NOTE — Patient Instructions (Signed)
[facility] Discharge Instructions for Patients Receiving Chemotherapy  Today you received the following chemotherapy agents:  Abraxane  To help prevent nausea and vomiting after your treatment, we encourage you to take your nausea medication as ordered per MD.   If you develop nausea and vomiting that is not controlled by your nausea medication, call the clinic.   BELOW ARE SYMPTOMS THAT SHOULD BE REPORTED IMMEDIATELY:  *FEVER GREATER THAN 100.5 F  *CHILLS WITH OR WITHOUT FEVER  NAUSEA AND VOMITING THAT IS NOT CONTROLLED WITH YOUR NAUSEA MEDICATION  *UNUSUAL SHORTNESS OF BREATH  *UNUSUAL BRUISING OR BLEEDING  TENDERNESS IN MOUTH AND THROAT WITH OR WITHOUT PRESENCE OF ULCERS  *URINARY PROBLEMS  *BOWEL PROBLEMS  UNUSUAL RASH Items with * indicate a potential emergency and should be followed up as soon as possible.  Feel free to call the clinic you have any questions or concerns. The clinic phone number is [phone].    

## 2014-04-11 NOTE — Patient Instructions (Signed)

## 2014-04-11 NOTE — Telephone Encounter (Signed)
, °
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
## 2014-04-14 ENCOUNTER — Telehealth: Payer: Self-pay | Admitting: Emergency Medicine

## 2014-04-14 NOTE — Telephone Encounter (Signed)
Pt given negative lab results. Instructed to keep oncology appt.

## 2014-04-14 NOTE — Telephone Encounter (Signed)
Message copied by Ricci Barker on Fri Apr 14, 2014  3:51 PM ------      Message from: Angelica Chessman E      Created: Wed Apr 12, 2014 10:56 AM       Please inform patient of her lab test results shows some improvement in her potassium level and her hemoglobin. Will encourage her to keep with the current treatment with the oncologist ------

## 2014-04-19 ENCOUNTER — Encounter: Payer: Self-pay | Admitting: Oncology

## 2014-04-19 NOTE — Progress Notes (Signed)
Patient left a message about her forms [facility] I told her to check with Val. I didn't have.

## 2014-04-24 ENCOUNTER — Ambulatory Visit: Payer: Medicaid Other

## 2014-04-25 ENCOUNTER — Ambulatory Visit: Payer: Medicaid Other

## 2014-04-25 ENCOUNTER — Ambulatory Visit (HOSPITAL_BASED_OUTPATIENT_CLINIC_OR_DEPARTMENT_OTHER): Payer: Medicaid Other

## 2014-04-25 ENCOUNTER — Other Ambulatory Visit (HOSPITAL_BASED_OUTPATIENT_CLINIC_OR_DEPARTMENT_OTHER): Payer: Medicaid Other

## 2014-04-25 ENCOUNTER — Ambulatory Visit (HOSPITAL_BASED_OUTPATIENT_CLINIC_OR_DEPARTMENT_OTHER): Payer: Medicaid Other | Admitting: Family

## 2014-04-25 ENCOUNTER — Other Ambulatory Visit: Payer: Self-pay | Admitting: Physician Assistant

## 2014-04-25 ENCOUNTER — Ambulatory Visit: Payer: Medicaid Other | Admitting: Oncology

## 2014-04-25 VITALS — BP 126/87 | HR 88 | Temp 98.3°F | Resp 18 | Ht 63.0 in | Wt 152.9 lb

## 2014-04-25 DIAGNOSIS — C7951 Secondary malignant neoplasm of bone: Secondary | ICD-10-CM

## 2014-04-25 DIAGNOSIS — C787 Secondary malignant neoplasm of liver and intrahepatic bile duct: Secondary | ICD-10-CM

## 2014-04-25 DIAGNOSIS — C773 Secondary and unspecified malignant neoplasm of axilla and upper limb lymph nodes: Secondary | ICD-10-CM

## 2014-04-25 DIAGNOSIS — C50419 Malignant neoplasm of upper-outer quadrant of unspecified female breast: Secondary | ICD-10-CM

## 2014-04-25 DIAGNOSIS — C762 Malignant neoplasm of abdomen: Secondary | ICD-10-CM

## 2014-04-25 DIAGNOSIS — C7989 Secondary malignant neoplasm of other specified sites: Secondary | ICD-10-CM

## 2014-04-25 DIAGNOSIS — D6481 Anemia due to antineoplastic chemotherapy: Secondary | ICD-10-CM

## 2014-04-25 DIAGNOSIS — Z95828 Presence of other vascular implants and grafts: Secondary | ICD-10-CM

## 2014-04-25 DIAGNOSIS — C50919 Malignant neoplasm of unspecified site of unspecified female breast: Secondary | ICD-10-CM

## 2014-04-25 DIAGNOSIS — C779 Secondary and unspecified malignant neoplasm of lymph node, unspecified: Secondary | ICD-10-CM

## 2014-04-25 DIAGNOSIS — C7952 Secondary malignant neoplasm of bone marrow: Secondary | ICD-10-CM

## 2014-04-25 DIAGNOSIS — C50411 Malignant neoplasm of upper-outer quadrant of right female breast: Secondary | ICD-10-CM

## 2014-04-25 DIAGNOSIS — Z5111 Encounter for antineoplastic chemotherapy: Secondary | ICD-10-CM

## 2014-04-25 DIAGNOSIS — T451X5A Adverse effect of antineoplastic and immunosuppressive drugs, initial encounter: Secondary | ICD-10-CM

## 2014-04-25 LAB — CBC WITH DIFFERENTIAL/PLATELET
BASO%: 0.4 % (ref 0.0–2.0)
Basophils Absolute: 0 10*3/uL (ref 0.0–0.1)
EOS ABS: 0.1 10*3/uL (ref 0.0–0.5)
EOS%: 2 % (ref 0.0–7.0)
HCT: 33 % — ABNORMAL LOW (ref 34.8–46.6)
HGB: 10.8 g/dL — ABNORMAL LOW (ref 11.6–15.9)
LYMPH#: 1.9 10*3/uL (ref 0.9–3.3)
LYMPH%: 34.4 % (ref 14.0–49.7)
MCH: 28.8 pg (ref 25.1–34.0)
MCHC: 32.7 g/dL (ref 31.5–36.0)
MCV: 88 fL (ref 79.5–101.0)
MONO#: 0.6 10*3/uL (ref 0.1–0.9)
MONO%: 10.5 % (ref 0.0–14.0)
NEUT%: 52.7 % (ref 38.4–76.8)
NEUTROS ABS: 2.9 10*3/uL (ref 1.5–6.5)
NRBC: 0 % (ref 0–0)
Platelets: 255 10*3/uL (ref 145–400)
RBC: 3.75 10*6/uL (ref 3.70–5.45)
RDW: 15.6 % — AB (ref 11.2–14.5)
WBC: 5.4 10*3/uL (ref 3.9–10.3)

## 2014-04-25 LAB — COMPREHENSIVE METABOLIC PANEL (CC13)
ALT: 35 U/L (ref 0–55)
ANION GAP: 7 meq/L (ref 3–11)
AST: 24 U/L (ref 5–34)
Albumin: 3.7 g/dL (ref 3.5–5.0)
Alkaline Phosphatase: 145 U/L (ref 40–150)
BUN: 6.6 mg/dL — ABNORMAL LOW (ref 7.0–26.0)
CALCIUM: 9.7 mg/dL (ref 8.4–10.4)
CHLORIDE: 107 meq/L (ref 98–109)
CO2: 24 meq/L (ref 22–29)
Creatinine: 0.8 mg/dL (ref 0.6–1.1)
Glucose: 91 mg/dl (ref 70–140)
Potassium: 3.6 mEq/L (ref 3.5–5.1)
SODIUM: 138 meq/L (ref 136–145)
TOTAL PROTEIN: 6.8 g/dL (ref 6.4–8.3)
Total Bilirubin: 0.59 mg/dL (ref 0.20–1.20)

## 2014-04-25 MED ORDER — ONDANSETRON 8 MG/NS 50 ML IVPB
INTRAVENOUS | Status: AC
  Filled 2014-04-25: qty 8

## 2014-04-25 MED ORDER — SODIUM CHLORIDE 0.9 % IJ SOLN
10.0000 mL | INTRAMUSCULAR | Status: DC | PRN
  Administered 2014-04-25: 10 mL
  Filled 2014-04-25: qty 10

## 2014-04-25 MED ORDER — ONDANSETRON 8 MG/50ML IVPB (CHCC)
8.0000 mg | Freq: Once | INTRAVENOUS | Status: AC
  Administered 2014-04-25: 8 mg via INTRAVENOUS

## 2014-04-25 MED ORDER — DEXAMETHASONE SODIUM PHOSPHATE 10 MG/ML IJ SOLN
10.0000 mg | Freq: Once | INTRAMUSCULAR | Status: AC
  Administered 2014-04-25: 10 mg via INTRAVENOUS

## 2014-04-25 MED ORDER — SODIUM CHLORIDE 0.9 % IJ SOLN
10.0000 mL | INTRAMUSCULAR | Status: DC | PRN
  Administered 2014-04-25: 10 mL via INTRAVENOUS
  Filled 2014-04-25: qty 10

## 2014-04-25 MED ORDER — SODIUM CHLORIDE 0.9 % IV SOLN
Freq: Once | INTRAVENOUS | Status: AC
  Administered 2014-04-25: 10:00:00 via INTRAVENOUS

## 2014-04-25 MED ORDER — DEXAMETHASONE SODIUM PHOSPHATE 10 MG/ML IJ SOLN
INTRAMUSCULAR | Status: AC
  Filled 2014-04-25: qty 1

## 2014-04-25 MED ORDER — HEPARIN SOD (PORK) LOCK FLUSH 100 UNIT/ML IV SOLN
500.0000 [IU] | Freq: Once | INTRAVENOUS | Status: AC | PRN
  Administered 2014-04-25: 500 [IU]
  Filled 2014-04-25: qty 5

## 2014-04-25 MED ORDER — PACLITAXEL PROTEIN-BOUND CHEMO INJECTION 100 MG
100.0000 mg/m2 | Freq: Once | INTRAVENOUS | Status: AC
  Administered 2014-04-25: 175 mg via INTRAVENOUS
  Filled 2014-04-25: qty 35

## 2014-04-25 NOTE — Patient Instructions (Signed)
Implanted Port Home Guide  An implanted port is a type of central line that is placed under the skin. Central lines are used to provide IV access when treatment or nutrition needs to be given through a person's veins. Implanted ports are used for long-term IV access. An implanted port may be placed because:    You need IV medicine that would be irritating to the small veins in your hands or arms.    You need long-term IV medicines, such as antibiotics.    You need IV nutrition for a long period.    You need frequent blood draws for lab tests.    You need dialysis.   Implanted ports are usually placed in the chest area, but they can also be placed in the upper arm, the abdomen, or the leg. An implanted port has two main parts:    Reservoir. The reservoir is round and will appear as a small, raised area under your skin. The reservoir is the part where a needle is inserted to give medicines or draw blood.    Catheter. The catheter is a thin, flexible tube that extends from the reservoir. The catheter is placed into a large vein. Medicine that is inserted into the reservoir goes into the catheter and then into the vein.   HOW WILL I CARE FOR MY INCISION SITE?  Do not get the incision site wet. Bathe or shower as directed by your health care Tammy Blanchard.   HOW IS MY PORT ACCESSED?  Special steps must be taken to access the port:    Before the port is accessed, a numbing cream can be placed on the skin. This helps numb the skin over the port site.    Your health care Tammy Blanchard uses a sterile technique to access the port.   Your health care Tammy Blanchard must put on a mask and sterile gloves.   The skin over your port is cleaned carefully with an antiseptic and allowed to dry.   The port is gently pinched between sterile gloves, and a needle is inserted into the port.   Only "non-coring" port needles should be used to access the port. Once the port is accessed, a blood return should be checked. This helps  ensure that the port is in the vein and is not clogged.    If your port needs to remain accessed for a constant infusion, a clear (transparent) bandage will be placed over the needle site. The bandage and needle will need to be changed every week, or as directed by your health care Tammy Blanchard.    Keep the bandage covering the needle clean and dry. Do not get it wet. Follow your health care Tammy Blanchard's instructions on how to take a shower or bath while the port is accessed.    If your port does not need to stay accessed, no bandage is needed over the port.   WHAT IS FLUSHING?  Flushing helps keep the port from getting clogged. Follow your health care Tammy Blanchard's instructions on how and when to flush the port. Ports are usually flushed with saline solution or a medicine called heparin. The need for flushing will depend on how the port is used.    If the port is used for intermittent medicines or blood draws, the port will need to be flushed:    After medicines have been given.    After blood has been drawn.    As part of routine maintenance.    If a constant infusion is   running, the port may not need to be flushed.   HOW LONG WILL MY PORT STAY IMPLANTED?  The port can stay in for as long as your health care Tammy Blanchard thinks it is needed. When it is time for the port to come out, surgery will be done to remove it. The procedure is similar to the one performed when the port was put in.   WHEN SHOULD I SEEK IMMEDIATE MEDICAL CARE?  When you have an implanted port, you should seek immediate medical care if:    You notice a bad smell coming from the incision site.    You have swelling, redness, or drainage at the incision site.    You have more swelling or pain at the port site or the surrounding area.    You have a fever that is not controlled with medicine.  Document Released: 10/20/2005 Document Revised: 08/10/2013 Document Reviewed: 06/27/2013  ExitCare Patient Information 2015 [employer]. This  information is not intended to replace advice given to you by your health care Tammy Blanchard. Make sure you discuss any questions you have with your health care Tammy Blanchard.

## 2014-04-25 NOTE — Patient Instructions (Signed)
[facility] Discharge Instructions for Patients Receiving Chemotherapy  Today you received the following chemotherapy agents Abraxane  To help prevent nausea and vomiting after your treatment, we encourage you to take your nausea medication as prescribed.   If you develop nausea and vomiting that is not controlled by your nausea medication, call the clinic.   BELOW ARE SYMPTOMS THAT SHOULD BE REPORTED IMMEDIATELY:  *FEVER GREATER THAN 100.5 F  *CHILLS WITH OR WITHOUT FEVER  NAUSEA AND VOMITING THAT IS NOT CONTROLLED WITH YOUR NAUSEA MEDICATION  *UNUSUAL SHORTNESS OF BREATH  *UNUSUAL BRUISING OR BLEEDING  TENDERNESS IN MOUTH AND THROAT WITH OR WITHOUT PRESENCE OF ULCERS  *URINARY PROBLEMS  *BOWEL PROBLEMS  UNUSUAL RASH Items with * indicate a potential emergency and should be followed up as soon as possible.  Feel free to call the clinic you have any questions or concerns. The clinic phone number is [phone].    
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
## 2014-04-26 ENCOUNTER — Other Ambulatory Visit: Payer: Self-pay | Admitting: Physician Assistant

## 2014-05-01 ENCOUNTER — Ambulatory Visit: Payer: Medicaid Other

## 2014-05-02 ENCOUNTER — Other Ambulatory Visit (HOSPITAL_BASED_OUTPATIENT_CLINIC_OR_DEPARTMENT_OTHER): Payer: Medicaid Other

## 2014-05-02 ENCOUNTER — Ambulatory Visit: Payer: Medicaid Other

## 2014-05-02 ENCOUNTER — Ambulatory Visit (HOSPITAL_BASED_OUTPATIENT_CLINIC_OR_DEPARTMENT_OTHER): Payer: Medicaid Other

## 2014-05-02 VITALS — BP 117/84 | HR 83 | Temp 98.1°F | Resp 17

## 2014-05-02 DIAGNOSIS — Z5111 Encounter for antineoplastic chemotherapy: Secondary | ICD-10-CM

## 2014-05-02 DIAGNOSIS — C7989 Secondary malignant neoplasm of other specified sites: Secondary | ICD-10-CM

## 2014-05-02 DIAGNOSIS — Z95828 Presence of other vascular implants and grafts: Secondary | ICD-10-CM

## 2014-05-02 DIAGNOSIS — C50919 Malignant neoplasm of unspecified site of unspecified female breast: Secondary | ICD-10-CM

## 2014-05-02 DIAGNOSIS — C773 Secondary and unspecified malignant neoplasm of axilla and upper limb lymph nodes: Secondary | ICD-10-CM

## 2014-05-02 DIAGNOSIS — C787 Secondary malignant neoplasm of liver and intrahepatic bile duct: Secondary | ICD-10-CM

## 2014-05-02 DIAGNOSIS — C762 Malignant neoplasm of abdomen: Secondary | ICD-10-CM

## 2014-05-02 DIAGNOSIS — C7952 Secondary malignant neoplasm of bone marrow: Secondary | ICD-10-CM

## 2014-05-02 DIAGNOSIS — C50419 Malignant neoplasm of upper-outer quadrant of unspecified female breast: Secondary | ICD-10-CM

## 2014-05-02 DIAGNOSIS — C50411 Malignant neoplasm of upper-outer quadrant of right female breast: Secondary | ICD-10-CM

## 2014-05-02 DIAGNOSIS — C7951 Secondary malignant neoplasm of bone: Secondary | ICD-10-CM

## 2014-05-02 LAB — COMPREHENSIVE METABOLIC PANEL (CC13)
ALT: 78 U/L — AB (ref 0–55)
ANION GAP: 8 meq/L (ref 3–11)
AST: 40 U/L — ABNORMAL HIGH (ref 5–34)
Albumin: 3.9 g/dL (ref 3.5–5.0)
Alkaline Phosphatase: 146 U/L (ref 40–150)
BUN: 10.1 mg/dL (ref 7.0–26.0)
CALCIUM: 9.6 mg/dL (ref 8.4–10.4)
CO2: 25 meq/L (ref 22–29)
CREATININE: 0.6 mg/dL (ref 0.6–1.1)
Chloride: 105 mEq/L (ref 98–109)
Glucose: 104 mg/dl (ref 70–140)
Potassium: 3.6 mEq/L (ref 3.5–5.1)
Sodium: 138 mEq/L (ref 136–145)
Total Bilirubin: 0.45 mg/dL (ref 0.20–1.20)
Total Protein: 6.9 g/dL (ref 6.4–8.3)

## 2014-05-02 LAB — TECHNOLOGIST REVIEW

## 2014-05-02 LAB — CBC WITH DIFFERENTIAL/PLATELET
BASO%: 1.1 % (ref 0.0–2.0)
BASOS ABS: 0.1 10*3/uL (ref 0.0–0.1)
EOS%: 3.1 % (ref 0.0–7.0)
Eosinophils Absolute: 0.2 10*3/uL (ref 0.0–0.5)
HEMATOCRIT: 33.7 % — AB (ref 34.8–46.6)
HGB: 11.1 g/dL — ABNORMAL LOW (ref 11.6–15.9)
LYMPH%: 38.7 % (ref 14.0–49.7)
MCH: 29.6 pg (ref 25.1–34.0)
MCHC: 33 g/dL (ref 31.5–36.0)
MCV: 89.7 fL (ref 79.5–101.0)
MONO#: 0.3 10*3/uL (ref 0.1–0.9)
MONO%: 5.5 % (ref 0.0–14.0)
NEUT#: 2.8 10*3/uL (ref 1.5–6.5)
NEUT%: 51.6 % (ref 38.4–76.8)
PLATELETS: 315 10*3/uL (ref 145–400)
RBC: 3.76 10*6/uL (ref 3.70–5.45)
RDW: 16.2 % — ABNORMAL HIGH (ref 11.2–14.5)
WBC: 5.5 10*3/uL (ref 3.9–10.3)
lymph#: 2.1 10*3/uL (ref 0.9–3.3)

## 2014-05-02 MED ORDER — SODIUM CHLORIDE 0.9 % IV SOLN
Freq: Once | INTRAVENOUS | Status: AC
  Administered 2014-05-02: 11:00:00 via INTRAVENOUS

## 2014-05-02 MED ORDER — DEXAMETHASONE SODIUM PHOSPHATE 10 MG/ML IJ SOLN
INTRAMUSCULAR | Status: AC
  Filled 2014-05-02: qty 1

## 2014-05-02 MED ORDER — SODIUM CHLORIDE 0.9 % IJ SOLN
10.0000 mL | INTRAMUSCULAR | Status: DC | PRN
  Administered 2014-05-02: 10 mL
  Filled 2014-05-02: qty 10

## 2014-05-02 MED ORDER — ONDANSETRON 8 MG/50ML IVPB (CHCC)
8.0000 mg | Freq: Once | INTRAVENOUS | Status: AC
  Administered 2014-05-02: 8 mg via INTRAVENOUS

## 2014-05-02 MED ORDER — SODIUM CHLORIDE 0.9 % IJ SOLN
10.0000 mL | INTRAMUSCULAR | Status: DC | PRN
  Administered 2014-05-02: 10 mL via INTRAVENOUS
  Filled 2014-05-02: qty 10

## 2014-05-02 MED ORDER — PACLITAXEL PROTEIN-BOUND CHEMO INJECTION 100 MG
100.0000 mg/m2 | Freq: Once | INTRAVENOUS | Status: AC
  Administered 2014-05-02: 175 mg via INTRAVENOUS
  Filled 2014-05-02: qty 35

## 2014-05-02 MED ORDER — HEPARIN SOD (PORK) LOCK FLUSH 100 UNIT/ML IV SOLN
500.0000 [IU] | Freq: Once | INTRAVENOUS | Status: AC | PRN
  Administered 2014-05-02: 500 [IU]
  Filled 2014-05-02: qty 5

## 2014-05-02 MED ORDER — DEXAMETHASONE SODIUM PHOSPHATE 10 MG/ML IJ SOLN
10.0000 mg | Freq: Once | INTRAMUSCULAR | Status: AC
  Administered 2014-05-02: 10 mg via INTRAVENOUS

## 2014-05-02 MED ORDER — ONDANSETRON 8 MG/NS 50 ML IVPB
INTRAVENOUS | Status: AC
  Filled 2014-05-02: qty 8

## 2014-05-02 NOTE — Patient Instructions (Signed)

## 2014-05-02 NOTE — Patient Instructions (Signed)
[facility] Discharge Instructions for Patients Receiving Chemotherapy  Today you received the following chemotherapy agents :  Abraxane.  To help prevent nausea and vomiting after your treatment, we encourage you to take your nausea medication as prescribed by your physician.   If you develop nausea and vomiting that is not controlled by your nausea medication, call the clinic.   BELOW ARE SYMPTOMS THAT SHOULD BE REPORTED IMMEDIATELY:  *FEVER GREATER THAN 100.5 F  *CHILLS WITH OR WITHOUT FEVER  NAUSEA AND VOMITING THAT IS NOT CONTROLLED WITH YOUR NAUSEA MEDICATION  *UNUSUAL SHORTNESS OF BREATH  *UNUSUAL BRUISING OR BLEEDING  TENDERNESS IN MOUTH AND THROAT WITH OR WITHOUT PRESENCE OF ULCERS  *URINARY PROBLEMS  *BOWEL PROBLEMS  UNUSUAL RASH Items with * indicate a potential emergency and should be followed up as soon as possible.  Feel free to call the clinic you have any questions or concerns. The clinic phone number is [phone].    

## 2014-05-04 ENCOUNTER — Encounter (HOSPITAL_COMMUNITY): Payer: Self-pay

## 2014-05-04 ENCOUNTER — Encounter (HOSPITAL_COMMUNITY)
Admission: RE | Admit: 2014-05-04 | Discharge: 2014-05-04 | Disposition: A | Discharge status: Home / Self Care | Payer: Medicaid Other | Source: Ambulatory Visit | Attending: Oncology | Admitting: Oncology

## 2014-05-04 ENCOUNTER — Ambulatory Visit (HOSPITAL_COMMUNITY)
Admission: RE | Admit: 2014-05-04 | Discharge: 2014-05-04 | Disposition: A | Discharge status: Home / Self Care | Payer: Medicaid Other | Source: Ambulatory Visit | Attending: Oncology | Admitting: Oncology

## 2014-05-04 DIAGNOSIS — C787 Secondary malignant neoplasm of liver and intrahepatic bile duct: Secondary | ICD-10-CM | POA: Diagnosis not present

## 2014-05-04 DIAGNOSIS — C50411 Malignant neoplasm of upper-outer quadrant of right female breast: Secondary | ICD-10-CM

## 2014-05-04 DIAGNOSIS — C7951 Secondary malignant neoplasm of bone: Secondary | ICD-10-CM | POA: Diagnosis not present

## 2014-05-04 DIAGNOSIS — C786 Secondary malignant neoplasm of retroperitoneum and peritoneum: Secondary | ICD-10-CM | POA: Diagnosis not present

## 2014-05-04 DIAGNOSIS — Z9221 Personal history of antineoplastic chemotherapy: Secondary | ICD-10-CM | POA: Insufficient documentation

## 2014-05-04 DIAGNOSIS — N281 Cyst of kidney, acquired: Secondary | ICD-10-CM | POA: Insufficient documentation

## 2014-05-04 DIAGNOSIS — I517 Cardiomegaly: Secondary | ICD-10-CM | POA: Insufficient documentation

## 2014-05-04 DIAGNOSIS — N2889 Other specified disorders of kidney and ureter: Secondary | ICD-10-CM | POA: Diagnosis not present

## 2014-05-04 DIAGNOSIS — Z9010 Acquired absence of unspecified breast and nipple: Secondary | ICD-10-CM | POA: Diagnosis not present

## 2014-05-04 DIAGNOSIS — C7989 Secondary malignant neoplasm of other specified sites: Secondary | ICD-10-CM

## 2014-05-04 DIAGNOSIS — C762 Malignant neoplasm of abdomen: Secondary | ICD-10-CM

## 2014-05-04 DIAGNOSIS — E049 Nontoxic goiter, unspecified: Secondary | ICD-10-CM | POA: Diagnosis not present

## 2014-05-04 DIAGNOSIS — C7952 Secondary malignant neoplasm of bone marrow: Secondary | ICD-10-CM

## 2014-05-04 DIAGNOSIS — C50919 Malignant neoplasm of unspecified site of unspecified female breast: Secondary | ICD-10-CM | POA: Diagnosis not present

## 2014-05-04 DIAGNOSIS — K668 Other specified disorders of peritoneum: Secondary | ICD-10-CM | POA: Insufficient documentation

## 2014-05-04 MED ORDER — IOHEXOL 300 MG/ML  SOLN
100.0000 mL | Freq: Once | INTRAMUSCULAR | Status: AC | PRN
  Administered 2014-05-04: 100 mL via INTRAVENOUS

## 2014-05-04 MED ORDER — TECHNETIUM TC 99M MEDRONATE IV KIT
25.0000 | PACK | Freq: Once | INTRAVENOUS | Status: AC | PRN
  Administered 2014-05-04: 25 via INTRAVENOUS

## 2014-05-08 ENCOUNTER — Telehealth: Payer: Self-pay | Admitting: *Deleted

## 2014-05-08 ENCOUNTER — Other Ambulatory Visit (HOSPITAL_BASED_OUTPATIENT_CLINIC_OR_DEPARTMENT_OTHER): Payer: Medicaid Other

## 2014-05-08 ENCOUNTER — Ambulatory Visit (HOSPITAL_BASED_OUTPATIENT_CLINIC_OR_DEPARTMENT_OTHER): Payer: Medicaid Other | Admitting: Oncology

## 2014-05-08 ENCOUNTER — Ambulatory Visit (HOSPITAL_BASED_OUTPATIENT_CLINIC_OR_DEPARTMENT_OTHER): Payer: Medicaid Other

## 2014-05-08 ENCOUNTER — Other Ambulatory Visit: Payer: Medicaid Other

## 2014-05-08 ENCOUNTER — Telehealth: Payer: Self-pay | Admitting: Oncology

## 2014-05-08 ENCOUNTER — Other Ambulatory Visit: Payer: Self-pay | Admitting: Oncology

## 2014-05-08 VITALS — BP 127/90 | HR 88 | Temp 98.5°F | Resp 18 | Ht 63.0 in | Wt 151.9 lb

## 2014-05-08 DIAGNOSIS — C50411 Malignant neoplasm of upper-outer quadrant of right female breast: Secondary | ICD-10-CM

## 2014-05-08 DIAGNOSIS — C7951 Secondary malignant neoplasm of bone: Secondary | ICD-10-CM

## 2014-05-08 DIAGNOSIS — E049 Nontoxic goiter, unspecified: Secondary | ICD-10-CM

## 2014-05-08 DIAGNOSIS — C7989 Secondary malignant neoplasm of other specified sites: Secondary | ICD-10-CM

## 2014-05-08 DIAGNOSIS — C800 Disseminated malignant neoplasm, unspecified: Secondary | ICD-10-CM

## 2014-05-08 DIAGNOSIS — C50419 Malignant neoplasm of upper-outer quadrant of unspecified female breast: Secondary | ICD-10-CM

## 2014-05-08 DIAGNOSIS — C50919 Malignant neoplasm of unspecified site of unspecified female breast: Secondary | ICD-10-CM

## 2014-05-08 DIAGNOSIS — Z95828 Presence of other vascular implants and grafts: Secondary | ICD-10-CM

## 2014-05-08 DIAGNOSIS — C762 Malignant neoplasm of abdomen: Secondary | ICD-10-CM

## 2014-05-08 LAB — CBC WITH DIFFERENTIAL/PLATELET
BASO%: 1 % (ref 0.0–2.0)
Basophils Absolute: 0 10*3/uL (ref 0.0–0.1)
EOS%: 3.1 % (ref 0.0–7.0)
Eosinophils Absolute: 0.1 10*3/uL (ref 0.0–0.5)
HCT: 34.8 % (ref 34.8–46.6)
HGB: 11.3 g/dL — ABNORMAL LOW (ref 11.6–15.9)
LYMPH#: 1.9 10*3/uL (ref 0.9–3.3)
LYMPH%: 39.8 % (ref 14.0–49.7)
MCH: 29.5 pg (ref 25.1–34.0)
MCHC: 32.4 g/dL (ref 31.5–36.0)
MCV: 91.2 fL (ref 79.5–101.0)
MONO#: 0.2 10*3/uL (ref 0.1–0.9)
MONO%: 3.2 % (ref 0.0–14.0)
NEUT#: 2.5 10*3/uL (ref 1.5–6.5)
NEUT%: 52.9 % (ref 38.4–76.8)
Platelets: 278 10*3/uL (ref 145–400)
RBC: 3.82 10*6/uL (ref 3.70–5.45)
RDW: 17.3 % — ABNORMAL HIGH (ref 11.2–14.5)
WBC: 4.8 10*3/uL (ref 3.9–10.3)

## 2014-05-08 LAB — COMPREHENSIVE METABOLIC PANEL (CC13)
ALBUMIN: 3.9 g/dL (ref 3.5–5.0)
ALT: 50 U/L (ref 0–55)
AST: 26 U/L (ref 5–34)
Alkaline Phosphatase: 117 U/L (ref 40–150)
Anion Gap: 9 mEq/L (ref 3–11)
BUN: 8.9 mg/dL (ref 7.0–26.0)
CALCIUM: 9.6 mg/dL (ref 8.4–10.4)
CHLORIDE: 107 meq/L (ref 98–109)
CO2: 23 mEq/L (ref 22–29)
Creatinine: 0.7 mg/dL (ref 0.6–1.1)
Glucose: 134 mg/dl (ref 70–140)
POTASSIUM: 3.3 meq/L — AB (ref 3.5–5.1)
SODIUM: 139 meq/L (ref 136–145)
TOTAL PROTEIN: 6.9 g/dL (ref 6.4–8.3)
Total Bilirubin: 0.75 mg/dL (ref 0.20–1.20)

## 2014-05-08 MED ORDER — SODIUM CHLORIDE 0.9 % IJ SOLN
10.0000 mL | INTRAMUSCULAR | Status: DC | PRN
  Administered 2014-05-08: 10 mL via INTRAVENOUS
  Filled 2014-05-08: qty 10

## 2014-05-08 MED ORDER — HEPARIN SOD (PORK) LOCK FLUSH 100 UNIT/ML IV SOLN
500.0000 [IU] | Freq: Once | INTRAVENOUS | Status: AC
  Administered 2014-05-08: 500 [IU] via INTRAVENOUS
  Filled 2014-05-08: qty 5

## 2014-05-08 NOTE — Progress Notes (Signed)
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 cycles and then restage.  I have made an appointment for Zalea to see me at the first day of her second cycle to troubleshoot any problems that may have developed from these changes. I am going to start the carboplatin at an AUC of 2, and if she tolerates that well we will be increasing that over the next 2 cycles to 4 if tolerated.  Illeana has a good understanding of the overall plan. She agrees with it. She knows the goal of treatment in her case is control. At some point we will switch  her to exemestane/everolimus or exemestane/Ibrance. We will make the decision after the next staging studies 3 months from now   Chauncey Cruel, MD 05/08/2014

## 2014-05-08 NOTE — Telephone Encounter (Signed)
, °

## 2014-05-08 NOTE — Patient Instructions (Signed)

## 2014-05-08 NOTE — Telephone Encounter (Signed)
Per staff message and POF I have scheduled appts. Advised scheduler of appts. JMW  

## 2014-05-09 ENCOUNTER — Other Ambulatory Visit: Payer: Medicaid Other

## 2014-05-10 ENCOUNTER — Telehealth: Payer: Self-pay | Admitting: Internal Medicine

## 2014-05-10 NOTE — Telephone Encounter (Signed)
Patient notified of Dr. Loreen Freud response and recommendations She will call back for new dysphagia, EGD /dil did help

## 2014-05-10 NOTE — Telephone Encounter (Signed)
CT chest, abd, pelvis reviewed: There is some thickening in the esophagus, likely related to stricture. Could be an element of esophagitis and so I would continue pantoprazole 40 mg The CT shows known malignancy, but I do not see any other bowel inflammation Hopefully she benefited from the esophageal dilation, which could be repeated as necessary for symptoms

## 2014-05-10 NOTE — Telephone Encounter (Signed)
Dr. Hilarie Fredrickson please review the CT scans performed on 04/11/14.  Patient would like your opinion on the inflammation.    Left message for patient to call back

## 2014-05-16 ENCOUNTER — Other Ambulatory Visit: Payer: Self-pay | Admitting: *Deleted

## 2014-05-16 ENCOUNTER — Other Ambulatory Visit: Payer: Medicaid Other

## 2014-05-16 ENCOUNTER — Ambulatory Visit (HOSPITAL_BASED_OUTPATIENT_CLINIC_OR_DEPARTMENT_OTHER): Payer: Medicaid Other

## 2014-05-16 ENCOUNTER — Ambulatory Visit (HOSPITAL_BASED_OUTPATIENT_CLINIC_OR_DEPARTMENT_OTHER): Payer: Medicaid Other | Admitting: Hematology

## 2014-05-16 ENCOUNTER — Other Ambulatory Visit (HOSPITAL_BASED_OUTPATIENT_CLINIC_OR_DEPARTMENT_OTHER): Payer: Medicaid Other

## 2014-05-16 ENCOUNTER — Encounter: Payer: Self-pay | Admitting: Hematology

## 2014-05-16 VITALS — BP 139/90 | HR 86 | Temp 97.9°F | Resp 20 | Ht 63.0 in | Wt 153.7 lb

## 2014-05-16 DIAGNOSIS — M25559 Pain in unspecified hip: Secondary | ICD-10-CM

## 2014-05-16 DIAGNOSIS — F418 Other specified anxiety disorders: Secondary | ICD-10-CM

## 2014-05-16 DIAGNOSIS — C7951 Secondary malignant neoplasm of bone: Secondary | ICD-10-CM

## 2014-05-16 DIAGNOSIS — C50419 Malignant neoplasm of upper-outer quadrant of unspecified female breast: Secondary | ICD-10-CM

## 2014-05-16 DIAGNOSIS — C7989 Secondary malignant neoplasm of other specified sites: Principal | ICD-10-CM

## 2014-05-16 DIAGNOSIS — Z95828 Presence of other vascular implants and grafts: Secondary | ICD-10-CM

## 2014-05-16 DIAGNOSIS — C762 Malignant neoplasm of abdomen: Secondary | ICD-10-CM

## 2014-05-16 DIAGNOSIS — C7952 Secondary malignant neoplasm of bone marrow: Secondary | ICD-10-CM

## 2014-05-16 DIAGNOSIS — C50919 Malignant neoplasm of unspecified site of unspecified female breast: Secondary | ICD-10-CM

## 2014-05-16 DIAGNOSIS — E049 Nontoxic goiter, unspecified: Secondary | ICD-10-CM

## 2014-05-16 DIAGNOSIS — C787 Secondary malignant neoplasm of liver and intrahepatic bile duct: Secondary | ICD-10-CM

## 2014-05-16 DIAGNOSIS — F419 Anxiety disorder, unspecified: Secondary | ICD-10-CM

## 2014-05-16 DIAGNOSIS — Z452 Encounter for adjustment and management of vascular access device: Secondary | ICD-10-CM

## 2014-05-16 DIAGNOSIS — Z5111 Encounter for antineoplastic chemotherapy: Secondary | ICD-10-CM

## 2014-05-16 DIAGNOSIS — C50411 Malignant neoplasm of upper-outer quadrant of right female breast: Secondary | ICD-10-CM

## 2014-05-16 DIAGNOSIS — C792 Secondary malignant neoplasm of skin: Secondary | ICD-10-CM

## 2014-05-16 LAB — COMPREHENSIVE METABOLIC PANEL (CC13)
ALK PHOS: 162 U/L — AB (ref 40–150)
ALT: 56 U/L — ABNORMAL HIGH (ref 0–55)
AST: 35 U/L — AB (ref 5–34)
Albumin: 3.8 g/dL (ref 3.5–5.0)
Anion Gap: 7 mEq/L (ref 3–11)
BUN: 9.1 mg/dL (ref 7.0–26.0)
CO2: 25 mEq/L (ref 22–29)
Calcium: 9.7 mg/dL (ref 8.4–10.4)
Chloride: 107 mEq/L (ref 98–109)
Creatinine: 0.6 mg/dL (ref 0.6–1.1)
Glucose: 98 mg/dl (ref 70–140)
POTASSIUM: 3.5 meq/L (ref 3.5–5.1)
Sodium: 139 mEq/L (ref 136–145)
Total Bilirubin: 0.62 mg/dL (ref 0.20–1.20)
Total Protein: 7 g/dL (ref 6.4–8.3)

## 2014-05-16 LAB — CBC WITH DIFFERENTIAL/PLATELET
BASO%: 0.2 % (ref 0.0–2.0)
Basophils Absolute: 0 10*3/uL (ref 0.0–0.1)
EOS%: 2.4 % (ref 0.0–7.0)
Eosinophils Absolute: 0.1 10*3/uL (ref 0.0–0.5)
HCT: 34.9 % (ref 34.8–46.6)
HGB: 11.3 g/dL — ABNORMAL LOW (ref 11.6–15.9)
LYMPH%: 33.2 % (ref 14.0–49.7)
MCH: 28.6 pg (ref 25.1–34.0)
MCHC: 32.4 g/dL (ref 31.5–36.0)
MCV: 88.4 fL (ref 79.5–101.0)
MONO#: 0.4 10*3/uL (ref 0.1–0.9)
MONO%: 9.8 % (ref 0.0–14.0)
NEUT#: 2.4 10*3/uL (ref 1.5–6.5)
NEUT%: 54.4 % (ref 38.4–76.8)
Platelets: 248 10*3/uL (ref 145–400)
RBC: 3.95 10*6/uL (ref 3.70–5.45)
RDW: 16 % — AB (ref 11.2–14.5)
WBC: 4.5 10*3/uL (ref 3.9–10.3)
lymph#: 1.5 10*3/uL (ref 0.9–3.3)

## 2014-05-16 MED ORDER — HEPARIN SOD (PORK) LOCK FLUSH 100 UNIT/ML IV SOLN
500.0000 [IU] | Freq: Once | INTRAVENOUS | Status: AC | PRN
  Administered 2014-05-16: 500 [IU]
  Filled 2014-05-16: qty 5

## 2014-05-16 MED ORDER — ZOLEDRONIC ACID 4 MG/100ML IV SOLN
4.0000 mg | Freq: Once | INTRAVENOUS | Status: AC
  Administered 2014-05-16: 4 mg via INTRAVENOUS
  Filled 2014-05-16: qty 100

## 2014-05-16 MED ORDER — CARBOPLATIN CHEMO INJECTION 450 MG/45ML
240.0000 mg | Freq: Once | INTRAVENOUS | Status: AC
  Administered 2014-05-16: 240 mg via INTRAVENOUS
  Filled 2014-05-16: qty 24

## 2014-05-16 MED ORDER — PACLITAXEL PROTEIN-BOUND CHEMO INJECTION 100 MG
100.0000 mg/m2 | Freq: Once | INTRAVENOUS | Status: AC
  Administered 2014-05-16: 175 mg via INTRAVENOUS
  Filled 2014-05-16: qty 35

## 2014-05-16 MED ORDER — SODIUM CHLORIDE 0.9 % IJ SOLN
10.0000 mL | INTRAMUSCULAR | Status: DC | PRN
  Administered 2014-05-16: 10 mL
  Filled 2014-05-16: qty 10

## 2014-05-16 MED ORDER — HYDROCODONE-ACETAMINOPHEN 7.5-325 MG PO TABS: 1.0000 | ORAL_TABLET | Freq: Three times a day (TID) | ORAL | Status: DC | PRN

## 2014-05-16 MED ORDER — SODIUM CHLORIDE 0.9 % IV SOLN
Freq: Once | INTRAVENOUS | Status: AC
  Administered 2014-05-16: 10:00:00 via INTRAVENOUS

## 2014-05-16 MED ORDER — PAROXETINE HCL 10 MG PO TABS
10.0000 mg | ORAL_TABLET | Freq: Every day | ORAL | Status: DC
Start: 2014-05-16 — End: 2014-12-20

## 2014-05-16 MED ORDER — SODIUM CHLORIDE 0.9 % IJ SOLN
10.0000 mL | INTRAMUSCULAR | Status: DC | PRN
  Administered 2014-05-16: 10 mL via INTRAVENOUS
  Filled 2014-05-16: qty 10

## 2014-05-16 MED ORDER — PAROXETINE HCL 10 MG PO TABS: 10.0000 mg | ORAL_TABLET | Freq: Every day | ORAL | Status: DC

## 2014-05-16 MED ORDER — DEXAMETHASONE SODIUM PHOSPHATE 10 MG/ML IJ SOLN
10.0000 mg | Freq: Once | INTRAMUSCULAR | Status: AC
  Administered 2014-05-16: 10 mg via INTRAVENOUS

## 2014-05-16 MED ORDER — ONDANSETRON 8 MG/50ML IVPB (CHCC)
8.0000 mg | Freq: Once | INTRAVENOUS | Status: AC
  Administered 2014-05-16: 8 mg via INTRAVENOUS

## 2014-05-16 MED ORDER — ONDANSETRON 8 MG/NS 50 ML IVPB
INTRAVENOUS | Status: AC
  Filled 2014-05-16: qty 8

## 2014-05-16 MED ORDER — HYDROCODONE-ACETAMINOPHEN 5-325 MG PO TABS: 1.0000 | ORAL_TABLET | Freq: Four times a day (QID) | ORAL | Status: DC | PRN

## 2014-05-16 MED ORDER — DEXAMETHASONE SODIUM PHOSPHATE 10 MG/ML IJ SOLN
INTRAMUSCULAR | Status: AC
  Filled 2014-05-16: qty 1

## 2014-05-16 NOTE — Progress Notes (Signed)
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 with a high proliferation fraction.   (4)  adjuvant radiation therapy, completed May 2011,   (5)  on tamoxifen May 2011 to January 2014 when she was found to have stage IV disease  (6) s/p TAH-BSO 11/25/2012 with metastatic brast cancer, estrogen receptor 30% and progestrerone receptor 20% positive, HER-2 negative  (7) anastrozole started February 2014, discontinued in April 2014 due to poor tolerance  (8)  patient started letrozole in mid May 2014, discontinued August 2014 with progression  (9)  zoledronic acid given every 28 days for bony metastatic disease, first dose in May 2014; changed to every 6 weeks beginning 02/28/2014 2 better coordinate with her Abraxane treatments  (10) skin involvement over the left  breast and possibly other distant skin sites noted June 2014, with   (a) biopsy of the left breast and a left axillary node 04/29/2013  confirming 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  (11) status post bilateral mastectomies 05/25/2013, showing:  (a) on the left, mypT1c NX invasive mammary carcinoma, with ductal and lobular features, grade 1, repeat HER-2 again negative  (b) on the right, yp T2 NX invasive lobular breast cancer, grade 1, with negative margins, and HER-2 again negative  (12) right chest wall skin biopsy and right neck biopsy both positive for metastatic breast cancer  (13) fulvestrant at 500 mg monthly started 06/10/2013, last dose 01/17/2014, with progression  (14)  Hot flashes, improving on clonidine at bedtime  (15)  Depression, started oral anti-depreesants  (16)  Skin lesion, right arm  (17)  thyroid nodule - biopsy 02/28/2014 benign   (18) Abraxane to be continued now with carboplatin cycle 5 day 1 today.  (19)  Dysphagia - swallowing study shows esophageal narrowing, no mass; S/P esophageal dilatation with improvement.   (20) Anemia - Due to chemotherapy. Asymptomatic.   (21)  GERD - minimal improvement on omeprazole, being increased to Protonix   PLAN:  Shirlee  is tolerating her treatment well. Clinical he she is improved, and we have radiologic evidence of improvement in the abdomen. The bones are stable, and are likely to remain so. The concern of course is a slight growth in the liver. This is minimal, just a few millimeters, we will monitor that.  After much discussion what we decided to do was to make a minimal change, namely add carboplatin to the day 1 treatment of her continuing Abraxane. She will be on carboplatin and Abraxane day 1, Abraxane alone day 8, and repeated every 21 days. We will do this another 4 cycles and then restage. Cycle 5 day 1 is today. Her Carbo AUC will be titrated up with next cycle depending upon the tolerance. She will also continue the Faslodex.  I did take pictures of her scalp and we will continue to monitor that  Today he is also  given a prescription for Paxil 10 mg by mouth daily.  Patient will also continue her Zometa which is every 6 weeks  Sueann has a good understanding of the overall plan. She agrees with it. She knows the goal of treatment in her case is control. At some point we will switch her to exemestane/everolimus or exemestane/Ibrance. We will make the decision after the next staging studies 3 months from now   Bernadene Bell, [place] Pager: [phone] Office No: [phone] 05/16/2014

## 2014-05-16 NOTE — Patient Instructions (Signed)
[facility] Discharge Instructions for Patients Receiving Chemotherapy  Today you received the following chemotherapy agents Abraxane/Carboplatin.   To help prevent nausea and vomiting after your treatment, we encourage you to take your nausea medication as directed.    If you develop nausea and vomiting that is not controlled by your nausea medication, call the clinic.   BELOW ARE SYMPTOMS THAT SHOULD BE REPORTED IMMEDIATELY:  *FEVER GREATER THAN 100.5 F  *CHILLS WITH OR WITHOUT FEVER  NAUSEA AND VOMITING THAT IS NOT CONTROLLED WITH YOUR NAUSEA MEDICATION  *UNUSUAL SHORTNESS OF BREATH  *UNUSUAL BRUISING OR BLEEDING  TENDERNESS IN MOUTH AND THROAT WITH OR WITHOUT PRESENCE OF ULCERS  *URINARY PROBLEMS  *BOWEL PROBLEMS  UNUSUAL RASH Items with * indicate a potential emergency and should be followed up as soon as possible.  Feel free to call the clinic you have any questions or concerns. The clinic phone number is [phone].  Carboplatin injection What is this medicine? CARBOPLATIN (KAR boe pla tin) is a chemotherapy drug. It targets fast dividing cells, like cancer cells, and causes these cells to die. This medicine is used to treat ovarian cancer and many other cancers. This medicine may be used for other purposes; ask your health care provider or pharmacist if you have questions. COMMON BRAND NAME(S): Paraplatin What should I tell my health care provider before I take this medicine? They need to know if you have any of these conditions: -blood disorders -hearing problems -kidney disease -recent or ongoing radiation therapy -an unusual or allergic reaction to carboplatin, cisplatin, other chemotherapy, other medicines, foods, dyes, or preservatives -pregnant or trying to get pregnant -breast-feeding How should I use this medicine? This drug is usually given as an infusion into a vein. It is administered in a hospital or clinic by a specially trained  health care professional. Talk to your pediatrician regarding the use of this medicine in children. Special care may be needed. Overdosage: If you think you have taken too much of this medicine contact a poison control center or emergency room at once. NOTE: This medicine is only for you. Do not share this medicine with others. What if I miss a dose? It is important not to miss a dose. Call your doctor or health care professional if you are unable to keep an appointment. What may interact with this medicine? -medicines for seizures -medicines to increase blood counts like filgrastim, pegfilgrastim, sargramostim -some antibiotics like amikacin, gentamicin, neomycin, streptomycin, tobramycin -vaccines Talk to your doctor or health care professional before taking any of these medicines: -acetaminophen -aspirin -ibuprofen -ketoprofen -naproxen This list may not describe all possible interactions. Give your health care provider a list of all the medicines, herbs, non-prescription drugs, or dietary supplements you use. Also tell them if you smoke, drink alcohol, or use illegal drugs. Some items may interact with your medicine. What should I watch for while using this medicine? Your condition will be monitored carefully while you are receiving this medicine. You will need important blood work done while you are taking this medicine. This drug may make you feel generally unwell. This is not uncommon, as chemotherapy can affect healthy cells as well as cancer cells. Report any side effects. Continue your course of treatment even though you feel ill unless your doctor tells you to stop. In some cases, you may be given additional medicines to help with side effects. Follow all directions for their use. Call your doctor or health care professional for advice if you  get a fever, chills or sore throat, or other symptoms of a cold or flu. Do not treat yourself. This drug decreases your body's ability to fight  infections. Try to avoid being around people who are sick. This medicine may increase your risk to bruise or bleed. Call your doctor or health care professional if you notice any unusual bleeding. Be careful brushing and flossing your teeth or using a toothpick because you may get an infection or bleed more easily. If you have any dental work done, tell your dentist you are receiving this medicine. Avoid taking products that contain aspirin, acetaminophen, ibuprofen, naproxen, or ketoprofen unless instructed by your doctor. These medicines may hide a fever. Do not become pregnant while taking this medicine. Women should inform their doctor if they wish to become pregnant or think they might be pregnant. There is a potential for serious side effects to an unborn child. Talk to your health care professional or pharmacist for more information. Do not breast-feed an infant while taking this medicine. What side effects may I notice from receiving this medicine? Side effects that you should report to your doctor or health care professional as soon as possible: -allergic reactions like skin rash, itching or hives, swelling of the face, lips, or tongue -signs of infection - fever or chills, cough, sore throat, pain or difficulty passing urine -signs of decreased platelets or bleeding - bruising, pinpoint red spots on the skin, black, tarry stools, nosebleeds -signs of decreased red blood cells - unusually weak or tired, fainting spells, lightheadedness -breathing problems -changes in hearing -changes in vision -chest pain -high blood pressure -low blood counts - This drug may decrease the number of white blood cells, red blood cells and platelets. You may be at increased risk for infections and bleeding. -nausea and vomiting -pain, swelling, redness or irritation at the injection site -pain, tingling, numbness in the hands or feet -problems with balance, talking, walking -trouble passing urine or change  in the amount of urine Side effects that usually do not require medical attention (report to your doctor or health care professional if they continue or are bothersome): -hair loss -loss of appetite -metallic taste in the mouth or changes in taste This list may not describe all possible side effects. Call your doctor for medical advice about side effects. You may report side effects to FDA at 1-800-FDA-1088. Where should I keep my medicine? This drug is given in a hospital or clinic and will not be stored at home. NOTE: This sheet is a summary. It may not cover all possible information. If you have questions about this medicine, talk to your doctor, pharmacist, or health care provider.  2015, [employer]. (2008-01-25 14:38:05)

## 2014-05-16 NOTE — Patient Instructions (Signed)

## 2014-05-22 ENCOUNTER — Other Ambulatory Visit: Payer: Self-pay

## 2014-05-23 ENCOUNTER — Ambulatory Visit (HOSPITAL_BASED_OUTPATIENT_CLINIC_OR_DEPARTMENT_OTHER): Payer: Medicaid Other | Admitting: Oncology

## 2014-05-23 ENCOUNTER — Ambulatory Visit: Payer: Medicaid Other

## 2014-05-23 ENCOUNTER — Other Ambulatory Visit (HOSPITAL_BASED_OUTPATIENT_CLINIC_OR_DEPARTMENT_OTHER): Payer: Medicaid Other

## 2014-05-23 ENCOUNTER — Ambulatory Visit (HOSPITAL_BASED_OUTPATIENT_CLINIC_OR_DEPARTMENT_OTHER): Payer: Medicaid Other

## 2014-05-23 ENCOUNTER — Encounter: Payer: Self-pay | Admitting: Oncology

## 2014-05-23 VITALS — BP 126/86 | HR 95 | Temp 98.5°F | Resp 18 | Ht 63.0 in | Wt 154.5 lb

## 2014-05-23 DIAGNOSIS — C50919 Malignant neoplasm of unspecified site of unspecified female breast: Secondary | ICD-10-CM

## 2014-05-23 DIAGNOSIS — R1310 Dysphagia, unspecified: Secondary | ICD-10-CM

## 2014-05-23 DIAGNOSIS — C50419 Malignant neoplasm of upper-outer quadrant of unspecified female breast: Secondary | ICD-10-CM

## 2014-05-23 DIAGNOSIS — C7951 Secondary malignant neoplasm of bone: Secondary | ICD-10-CM

## 2014-05-23 DIAGNOSIS — T451X5A Adverse effect of antineoplastic and immunosuppressive drugs, initial encounter: Secondary | ICD-10-CM

## 2014-05-23 DIAGNOSIS — C787 Secondary malignant neoplasm of liver and intrahepatic bile duct: Secondary | ICD-10-CM

## 2014-05-23 DIAGNOSIS — C7989 Secondary malignant neoplasm of other specified sites: Secondary | ICD-10-CM

## 2014-05-23 DIAGNOSIS — R5381 Other malaise: Secondary | ICD-10-CM

## 2014-05-23 DIAGNOSIS — C7952 Secondary malignant neoplasm of bone marrow: Secondary | ICD-10-CM

## 2014-05-23 DIAGNOSIS — C800 Disseminated malignant neoplasm, unspecified: Secondary | ICD-10-CM

## 2014-05-23 DIAGNOSIS — R5383 Other fatigue: Secondary | ICD-10-CM

## 2014-05-23 DIAGNOSIS — Z5111 Encounter for antineoplastic chemotherapy: Secondary | ICD-10-CM

## 2014-05-23 DIAGNOSIS — D6481 Anemia due to antineoplastic chemotherapy: Secondary | ICD-10-CM

## 2014-05-23 DIAGNOSIS — C762 Malignant neoplasm of abdomen: Secondary | ICD-10-CM

## 2014-05-23 DIAGNOSIS — Z95828 Presence of other vascular implants and grafts: Secondary | ICD-10-CM

## 2014-05-23 DIAGNOSIS — C50411 Malignant neoplasm of upper-outer quadrant of right female breast: Secondary | ICD-10-CM

## 2014-05-23 DIAGNOSIS — C792 Secondary malignant neoplasm of skin: Secondary | ICD-10-CM

## 2014-05-23 LAB — COMPREHENSIVE METABOLIC PANEL (CC13)
ALK PHOS: 187 U/L — AB (ref 40–150)
ALT: 92 U/L — ABNORMAL HIGH (ref 0–55)
AST: 55 U/L — ABNORMAL HIGH (ref 5–34)
Albumin: 3.7 g/dL (ref 3.5–5.0)
Anion Gap: 8 mEq/L (ref 3–11)
BILIRUBIN TOTAL: 0.51 mg/dL (ref 0.20–1.20)
BUN: 9.3 mg/dL (ref 7.0–26.0)
CO2: 24 mEq/L (ref 22–29)
Calcium: 10.1 mg/dL (ref 8.4–10.4)
Chloride: 107 mEq/L (ref 98–109)
Creatinine: 0.7 mg/dL (ref 0.6–1.1)
GLUCOSE: 103 mg/dL (ref 70–140)
POTASSIUM: 3.7 meq/L (ref 3.5–5.1)
Sodium: 139 mEq/L (ref 136–145)
Total Protein: 7 g/dL (ref 6.4–8.3)

## 2014-05-23 LAB — CBC WITH DIFFERENTIAL/PLATELET
BASO%: 0.5 % (ref 0.0–2.0)
Basophils Absolute: 0 10*3/uL (ref 0.0–0.1)
EOS ABS: 0.1 10*3/uL (ref 0.0–0.5)
EOS%: 2.5 % (ref 0.0–7.0)
HEMATOCRIT: 33.9 % — AB (ref 34.8–46.6)
HGB: 11.4 g/dL — ABNORMAL LOW (ref 11.6–15.9)
LYMPH%: 32.7 % (ref 14.0–49.7)
MCH: 29.6 pg (ref 25.1–34.0)
MCHC: 33.6 g/dL (ref 31.5–36.0)
MCV: 88.1 fL (ref 79.5–101.0)
MONO#: 0.5 10*3/uL (ref 0.1–0.9)
MONO%: 7.9 % (ref 0.0–14.0)
NEUT%: 56.4 % (ref 38.4–76.8)
NEUTROS ABS: 3.2 10*3/uL (ref 1.5–6.5)
PLATELETS: 312 10*3/uL (ref 145–400)
RBC: 3.85 10*6/uL (ref 3.70–5.45)
RDW: 15.9 % — ABNORMAL HIGH (ref 11.2–14.5)
WBC: 5.7 10*3/uL (ref 3.9–10.3)
lymph#: 1.9 10*3/uL (ref 0.9–3.3)
nRBC: 0 % (ref 0–0)

## 2014-05-23 LAB — TECHNOLOGIST REVIEW

## 2014-05-23 MED ORDER — ONDANSETRON 8 MG/NS 50 ML IVPB
INTRAVENOUS | Status: AC
  Filled 2014-05-23: qty 8

## 2014-05-23 MED ORDER — SODIUM CHLORIDE 0.9 % IV SOLN
Freq: Once | INTRAVENOUS | Status: AC
  Administered 2014-05-23: 10:00:00 via INTRAVENOUS

## 2014-05-23 MED ORDER — SODIUM CHLORIDE 0.9 % IJ SOLN
10.0000 mL | INTRAMUSCULAR | Status: DC | PRN
  Administered 2014-05-23: 10 mL
  Filled 2014-05-23: qty 10

## 2014-05-23 MED ORDER — SUCRALFATE 1 GM/10ML PO SUSP: 1.0000 g | Freq: Three times a day (TID) | ORAL | Status: DC

## 2014-05-23 MED ORDER — ONDANSETRON 8 MG/50ML IVPB (CHCC)
8.0000 mg | Freq: Once | INTRAVENOUS | Status: AC
  Administered 2014-05-23: 8 mg via INTRAVENOUS

## 2014-05-23 MED ORDER — PACLITAXEL PROTEIN-BOUND CHEMO INJECTION 100 MG
100.0000 mg/m2 | Freq: Once | INTRAVENOUS | Status: AC
  Administered 2014-05-23: 175 mg via INTRAVENOUS
  Filled 2014-05-23: qty 35

## 2014-05-23 MED ORDER — HEPARIN SOD (PORK) LOCK FLUSH 100 UNIT/ML IV SOLN
500.0000 [IU] | Freq: Once | INTRAVENOUS | Status: AC
  Administered 2014-05-23: 500 [IU] via INTRAVENOUS
  Filled 2014-05-23: qty 5

## 2014-05-23 MED ORDER — DEXAMETHASONE SODIUM PHOSPHATE 10 MG/ML IJ SOLN
INTRAMUSCULAR | Status: AC
  Filled 2014-05-23: qty 1

## 2014-05-23 MED ORDER — HEPARIN SOD (PORK) LOCK FLUSH 100 UNIT/ML IV SOLN
500.0000 [IU] | Freq: Once | INTRAVENOUS | Status: AC | PRN
  Administered 2014-05-23: 500 [IU]
  Filled 2014-05-23: qty 5

## 2014-05-23 MED ORDER — SODIUM CHLORIDE 0.9 % IJ SOLN
10.0000 mL | INTRAMUSCULAR | Status: DC | PRN
  Administered 2014-05-23: 10 mL via INTRAVENOUS
  Filled 2014-05-23: qty 10

## 2014-05-23 MED ORDER — DEXAMETHASONE SODIUM PHOSPHATE 10 MG/ML IJ SOLN
10.0000 mg | Freq: Once | INTRAMUSCULAR | Status: AC
  Administered 2014-05-23: 10 mg via INTRAVENOUS

## 2014-05-23 NOTE — Patient Instructions (Signed)

## 2014-05-23 NOTE — Progress Notes (Signed)
Pt request that op site be used instead of tegaderm.

## 2014-05-23 NOTE — Patient Instructions (Signed)
[facility] Discharge Instructions for Patients Receiving Chemotherapy  Today you received the following chemotherapy agents :  Abraxane.  To help prevent nausea and vomiting after your treatment, we encourage you to take your nausea medication as prescribed by your physician.   If you develop nausea and vomiting that is not controlled by your nausea medication, call the clinic.   BELOW ARE SYMPTOMS THAT SHOULD BE REPORTED IMMEDIATELY:  *FEVER GREATER THAN 100.5 F  *CHILLS WITH OR WITHOUT FEVER  NAUSEA AND VOMITING THAT IS NOT CONTROLLED WITH YOUR NAUSEA MEDICATION  *UNUSUAL SHORTNESS OF BREATH  *UNUSUAL BRUISING OR BLEEDING  TENDERNESS IN MOUTH AND THROAT WITH OR WITHOUT PRESENCE OF ULCERS  *URINARY PROBLEMS  *BOWEL PROBLEMS  UNUSUAL RASH Items with * indicate a potential emergency and should be followed up as soon as possible.  Feel free to call the clinic you have any questions or concerns. The clinic phone number is [phone].    

## 2014-05-23 NOTE — Progress Notes (Signed)
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 estrogen receptor 30% and progestrerone receptor 20% positive, HER-2 negative  (7) anastrozole started February 2014, discontinued in April 2014 due to poor tolerance  (8)  patient started letrozole in mid May 2014, discontinued August 2014 with progression  (9)  zoledronic acid given every 28 days for bony metastatic disease, first dose in May 2014; changed to every 6 weeks beginning 02/28/2014 2 better coordinate with her Abraxane treatments  (10) skin involvement over the left breast and possibly other distant skin sites noted June 2014, with   (a) biopsy of the left breast and a left axillary node 04/29/2013  confirming 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  (11) status post bilateral mastectomies 05/25/2013, showing:  (a) on the left, mypT1c NX invasive mammary carcinoma, with ductal and lobular  features, grade 1, repeat HER-2 again negative  (b) on the right, yp T2 NX invasive lobular breast cancer, grade 1, with negative margins, and HER-2 again negative  (12) right chest wall skin biopsy and right neck biopsy both positive for metastatic breast cancer  (13) fulvestrant at 500 mg monthly started 06/10/2013, last dose 01/17/2014, with progression  (14)  Hot flashes, improving on clonidine at bedtime  (15)  Depression, started oral anti-depreesants  (16)  Skin lesion, right arm  (17)  thyroid nodule - biopsy 02/28/2014 benign   (18) Abraxane to be continued now with carboplatin cycle 5 day 1 today.  (19)  Dysphagia - swallowing study shows esophageal narrowing, no mass; S/P esophageal dilatation with improvement.   (20) Anemia - Due to chemotherapy. Asymptomatic.   (21)  GERD - minimal improvement on omeprazole, being increased to Protonix   PLAN:  Saharah  is tolerating her treatment moderately well. She is having more fatigue with the addition of Carboplatin to her regimen. I have recommended that she proceed with cycle 5 day 8 of her chemo today. Will will continue to monitor her fatigue.  She will be on carboplatin and Abraxane day 1, Abraxane alone day 8, and repeated every 21 days. We will do this another 4 cycles and then restage. Cycle 5 day 8 is today. Her Carbo AUC will be titrated up with next cycle depending upon the tolerance. She will also continue the Faslodex.  Patient will also continue her Zometa which is every 6 weeks. This was last given on 05/16/14.  She seems to be having more mucus in her throat. I think that this is due to her GERD. She will continue the Protonix and I have given her Carafate as well to see if this helps her symptoms.  Mahina has a good understanding of the overall plan. She agrees with it. She knows the goal of treatment in her case is control. At some point we will switch her to exemestane/everolimus or exemestane/Ibrance. We will  make the decision after the next staging studies 3 months from now   Mikey Bussing, [place], AGPCNP-BC 05/16/2014

## 2014-06-06 ENCOUNTER — Telehealth: Payer: Self-pay | Admitting: *Deleted

## 2014-06-06 ENCOUNTER — Other Ambulatory Visit: Payer: Self-pay | Admitting: *Deleted

## 2014-06-06 ENCOUNTER — Telehealth: Payer: Self-pay | Admitting: Oncology

## 2014-06-06 ENCOUNTER — Ambulatory Visit: Payer: Medicaid Other

## 2014-06-06 ENCOUNTER — Ambulatory Visit (HOSPITAL_BASED_OUTPATIENT_CLINIC_OR_DEPARTMENT_OTHER): Payer: Medicaid Other | Admitting: Oncology

## 2014-06-06 ENCOUNTER — Ambulatory Visit (HOSPITAL_BASED_OUTPATIENT_CLINIC_OR_DEPARTMENT_OTHER): Payer: Medicaid Other

## 2014-06-06 ENCOUNTER — Other Ambulatory Visit (HOSPITAL_BASED_OUTPATIENT_CLINIC_OR_DEPARTMENT_OTHER): Payer: Medicaid Other

## 2014-06-06 VITALS — BP 118/78 | HR 70 | Temp 98.7°F | Resp 18 | Ht 63.0 in | Wt 152.9 lb

## 2014-06-06 DIAGNOSIS — C7989 Secondary malignant neoplasm of other specified sites: Secondary | ICD-10-CM

## 2014-06-06 DIAGNOSIS — C762 Malignant neoplasm of abdomen: Secondary | ICD-10-CM

## 2014-06-06 DIAGNOSIS — Z5111 Encounter for antineoplastic chemotherapy: Secondary | ICD-10-CM

## 2014-06-06 DIAGNOSIS — C50919 Malignant neoplasm of unspecified site of unspecified female breast: Secondary | ICD-10-CM

## 2014-06-06 DIAGNOSIS — C792 Secondary malignant neoplasm of skin: Secondary | ICD-10-CM

## 2014-06-06 DIAGNOSIS — E049 Nontoxic goiter, unspecified: Secondary | ICD-10-CM

## 2014-06-06 DIAGNOSIS — C7951 Secondary malignant neoplasm of bone: Secondary | ICD-10-CM

## 2014-06-06 DIAGNOSIS — Z95828 Presence of other vascular implants and grafts: Secondary | ICD-10-CM

## 2014-06-06 DIAGNOSIS — C779 Secondary and unspecified malignant neoplasm of lymph node, unspecified: Secondary | ICD-10-CM

## 2014-06-06 DIAGNOSIS — C50911 Malignant neoplasm of unspecified site of right female breast: Secondary | ICD-10-CM

## 2014-06-06 DIAGNOSIS — C50411 Malignant neoplasm of upper-outer quadrant of right female breast: Secondary | ICD-10-CM

## 2014-06-06 DIAGNOSIS — D509 Iron deficiency anemia, unspecified: Secondary | ICD-10-CM

## 2014-06-06 DIAGNOSIS — C50419 Malignant neoplasm of upper-outer quadrant of unspecified female breast: Secondary | ICD-10-CM

## 2014-06-06 DIAGNOSIS — C7952 Secondary malignant neoplasm of bone marrow: Secondary | ICD-10-CM

## 2014-06-06 LAB — CBC WITH DIFFERENTIAL/PLATELET
BASO%: 0.9 % (ref 0.0–2.0)
Basophils Absolute: 0 10*3/uL (ref 0.0–0.1)
EOS%: 2.6 % (ref 0.0–7.0)
Eosinophils Absolute: 0.1 10*3/uL (ref 0.0–0.5)
HEMATOCRIT: 34.6 % — AB (ref 34.8–46.6)
HGB: 11.4 g/dL — ABNORMAL LOW (ref 11.6–15.9)
LYMPH#: 2 10*3/uL (ref 0.9–3.3)
LYMPH%: 37.3 % (ref 14.0–49.7)
MCH: 29.6 pg (ref 25.1–34.0)
MCHC: 32.9 g/dL (ref 31.5–36.0)
MCV: 90 fL (ref 79.5–101.0)
MONO#: 0.5 10*3/uL (ref 0.1–0.9)
MONO%: 9.4 % (ref 0.0–14.0)
NEUT#: 2.6 10*3/uL (ref 1.5–6.5)
NEUT%: 49.8 % (ref 38.4–76.8)
Platelets: 298 10*3/uL (ref 145–400)
RBC: 3.84 10*6/uL (ref 3.70–5.45)
RDW: 17.3 % — AB (ref 11.2–14.5)
WBC: 5.2 10*3/uL (ref 3.9–10.3)

## 2014-06-06 LAB — COMPREHENSIVE METABOLIC PANEL (CC13)
ALT: 63 U/L — ABNORMAL HIGH (ref 0–55)
AST: 39 U/L — AB (ref 5–34)
Albumin: 3.5 g/dL (ref 3.5–5.0)
Alkaline Phosphatase: 187 U/L — ABNORMAL HIGH (ref 40–150)
Anion Gap: 9 mEq/L (ref 3–11)
BUN: 14.5 mg/dL (ref 7.0–26.0)
CO2: 24 meq/L (ref 22–29)
CREATININE: 0.6 mg/dL (ref 0.6–1.1)
Calcium: 10 mg/dL (ref 8.4–10.4)
Chloride: 108 mEq/L (ref 98–109)
Glucose: 110 mg/dl (ref 70–140)
POTASSIUM: 3.4 meq/L — AB (ref 3.5–5.1)
Sodium: 141 mEq/L (ref 136–145)
TOTAL PROTEIN: 6.8 g/dL (ref 6.4–8.3)
Total Bilirubin: 0.43 mg/dL (ref 0.20–1.20)

## 2014-06-06 MED ORDER — ONDANSETRON 8 MG/NS 50 ML IVPB
INTRAVENOUS | Status: AC
  Filled 2014-06-06: qty 8

## 2014-06-06 MED ORDER — SODIUM CHLORIDE 0.9 % IJ SOLN
10.0000 mL | INTRAMUSCULAR | Status: DC | PRN
  Administered 2014-06-06: 10 mL via INTRAVENOUS
  Filled 2014-06-06: qty 10

## 2014-06-06 MED ORDER — SODIUM CHLORIDE 0.9 % IJ SOLN
10.0000 mL | INTRAMUSCULAR | Status: DC | PRN
  Administered 2014-06-06: 10 mL
  Filled 2014-06-06: qty 10

## 2014-06-06 MED ORDER — DEXAMETHASONE SODIUM PHOSPHATE 10 MG/ML IJ SOLN
10.0000 mg | Freq: Once | INTRAMUSCULAR | Status: AC
  Administered 2014-06-06: 10 mg via INTRAVENOUS

## 2014-06-06 MED ORDER — ONDANSETRON 8 MG/50ML IVPB (CHCC)
8.0000 mg | Freq: Once | INTRAVENOUS | Status: AC
  Administered 2014-06-06: 8 mg via INTRAVENOUS

## 2014-06-06 MED ORDER — PACLITAXEL PROTEIN-BOUND CHEMO INJECTION 100 MG
100.0000 mg/m2 | Freq: Once | INTRAVENOUS | Status: AC
  Administered 2014-06-06: 175 mg via INTRAVENOUS
  Filled 2014-06-06: qty 35

## 2014-06-06 MED ORDER — SODIUM CHLORIDE 0.9 % IV SOLN
239.4000 mg | Freq: Once | INTRAVENOUS | Status: AC
  Administered 2014-06-06: 240 mg via INTRAVENOUS
  Filled 2014-06-06: qty 24

## 2014-06-06 MED ORDER — DEXAMETHASONE SODIUM PHOSPHATE 10 MG/ML IJ SOLN
INTRAMUSCULAR | Status: AC
  Filled 2014-06-06: qty 1

## 2014-06-06 MED ORDER — HEPARIN SOD (PORK) LOCK FLUSH 100 UNIT/ML IV SOLN
500.0000 [IU] | Freq: Once | INTRAVENOUS | Status: AC | PRN
  Administered 2014-06-06: 500 [IU]
  Filled 2014-06-06: qty 5

## 2014-06-06 MED ORDER — SODIUM CHLORIDE 0.9 % IV SOLN
Freq: Once | INTRAVENOUS | Status: AC
  Administered 2014-06-06: 10:00:00 via INTRAVENOUS

## 2014-06-06 MED ORDER — HEPARIN SOD (PORK) LOCK FLUSH 100 UNIT/ML IV SOLN
500.0000 [IU] | Freq: Once | INTRAVENOUS | Status: AC
  Administered 2014-06-06: 500 [IU] via INTRAVENOUS
  Filled 2014-06-06: qty 5

## 2014-06-06 NOTE — Telephone Encounter (Signed)
Per [facility] staff phone call scheduled appts. Advised schedulers 

## 2014-06-06 NOTE — Patient Instructions (Signed)
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
## 2014-06-06 NOTE — Patient Instructions (Signed)
[facility] Discharge Instructions for Patients Receiving Chemotherapy  Today you received the following chemotherapy agents Abraxane and Carboplatin  To help prevent nausea and vomiting after your treatment, we encourage you to take your nausea medication  As prescribed   If you develop nausea and vomiting that is not controlled by your nausea medication, call the clinic.   BELOW ARE SYMPTOMS THAT SHOULD BE REPORTED IMMEDIATELY:  *FEVER GREATER THAN 100.5 F  *CHILLS WITH OR WITHOUT FEVER  NAUSEA AND VOMITING THAT IS NOT CONTROLLED WITH YOUR NAUSEA MEDICATION  *UNUSUAL SHORTNESS OF BREATH  *UNUSUAL BRUISING OR BLEEDING  TENDERNESS IN MOUTH AND THROAT WITH OR WITHOUT PRESENCE OF ULCERS  *URINARY PROBLEMS  *BOWEL PROBLEMS  UNUSUAL RASH Items with * indicate a potential emergency and should be followed up as soon as possible.  Feel free to call the clinic you have any questions or concerns. The clinic phone number is [phone].

## 2014-06-08 ENCOUNTER — Other Ambulatory Visit: Payer: Medicaid Other

## 2014-06-08 ENCOUNTER — Ambulatory Visit: Payer: Medicaid Other | Admitting: Oncology

## 2014-06-13 ENCOUNTER — Ambulatory Visit (HOSPITAL_BASED_OUTPATIENT_CLINIC_OR_DEPARTMENT_OTHER): Payer: Medicaid Other | Admitting: Hematology

## 2014-06-13 ENCOUNTER — Other Ambulatory Visit (HOSPITAL_BASED_OUTPATIENT_CLINIC_OR_DEPARTMENT_OTHER): Payer: Medicaid Other

## 2014-06-13 ENCOUNTER — Ambulatory Visit: Payer: Medicaid Other

## 2014-06-13 ENCOUNTER — Encounter: Payer: Self-pay | Admitting: Hematology

## 2014-06-13 ENCOUNTER — Ambulatory Visit (HOSPITAL_BASED_OUTPATIENT_CLINIC_OR_DEPARTMENT_OTHER): Payer: Medicaid Other

## 2014-06-13 ENCOUNTER — Other Ambulatory Visit: Payer: Self-pay | Admitting: *Deleted

## 2014-06-13 ENCOUNTER — Encounter: Payer: Self-pay | Admitting: *Deleted

## 2014-06-13 VITALS — BP 115/82 | HR 79 | Temp 98.2°F | Resp 18 | Ht 63.0 in | Wt 154.5 lb

## 2014-06-13 DIAGNOSIS — C7951 Secondary malignant neoplasm of bone: Secondary | ICD-10-CM

## 2014-06-13 DIAGNOSIS — C7989 Secondary malignant neoplasm of other specified sites: Secondary | ICD-10-CM

## 2014-06-13 DIAGNOSIS — C50919 Malignant neoplasm of unspecified site of unspecified female breast: Secondary | ICD-10-CM

## 2014-06-13 DIAGNOSIS — C50419 Malignant neoplasm of upper-outer quadrant of unspecified female breast: Secondary | ICD-10-CM

## 2014-06-13 DIAGNOSIS — C800 Disseminated malignant neoplasm, unspecified: Secondary | ICD-10-CM

## 2014-06-13 DIAGNOSIS — C762 Malignant neoplasm of abdomen: Secondary | ICD-10-CM

## 2014-06-13 DIAGNOSIS — Z95828 Presence of other vascular implants and grafts: Secondary | ICD-10-CM

## 2014-06-13 DIAGNOSIS — Z170 Estrogen receptor positive status [ER+]: Secondary | ICD-10-CM

## 2014-06-13 DIAGNOSIS — I10 Essential (primary) hypertension: Secondary | ICD-10-CM

## 2014-06-13 DIAGNOSIS — C50411 Malignant neoplasm of upper-outer quadrant of right female breast: Secondary | ICD-10-CM

## 2014-06-13 LAB — CBC WITH DIFFERENTIAL/PLATELET
BASO%: 0.7 % (ref 0.0–2.0)
Basophils Absolute: 0 10*3/uL (ref 0.0–0.1)
EOS%: 2.6 % (ref 0.0–7.0)
Eosinophils Absolute: 0.1 10*3/uL (ref 0.0–0.5)
HCT: 34.1 % — ABNORMAL LOW (ref 34.8–46.6)
HGB: 11.3 g/dL — ABNORMAL LOW (ref 11.6–15.9)
LYMPH#: 1.6 10*3/uL (ref 0.9–3.3)
LYMPH%: 37.4 % (ref 14.0–49.7)
MCH: 29.7 pg (ref 25.1–34.0)
MCHC: 33.1 g/dL (ref 31.5–36.0)
MCV: 89.5 fL (ref 79.5–101.0)
MONO#: 0.3 10*3/uL (ref 0.1–0.9)
MONO%: 6.5 % (ref 0.0–14.0)
NEUT#: 2.3 10*3/uL (ref 1.5–6.5)
NEUT%: 52.8 % (ref 38.4–76.8)
PLATELETS: 281 10*3/uL (ref 145–400)
RBC: 3.81 10*6/uL (ref 3.70–5.45)
RDW: 15.8 % — ABNORMAL HIGH (ref 11.2–14.5)
WBC: 4.3 10*3/uL (ref 3.9–10.3)

## 2014-06-13 LAB — COMPREHENSIVE METABOLIC PANEL
ALBUMIN: 3.6 g/dL (ref 3.5–5.2)
ALK PHOS: 189 U/L — AB (ref 39–117)
ALT: 86 U/L — ABNORMAL HIGH (ref 0–35)
AST: 52 U/L — ABNORMAL HIGH (ref 0–37)
BILIRUBIN TOTAL: 0.4 mg/dL (ref 0.2–1.2)
BUN: 11 mg/dL (ref 6–23)
CO2: 26 mEq/L (ref 19–32)
Calcium: 9.8 mg/dL (ref 8.4–10.5)
Chloride: 102 mEq/L (ref 96–112)
Creatinine, Ser: 0.5 mg/dL (ref 0.50–1.10)
GLUCOSE: 96 mg/dL (ref 70–99)
POTASSIUM: 4 meq/L (ref 3.5–5.3)
SODIUM: 139 meq/L (ref 135–145)
TOTAL PROTEIN: 7 g/dL (ref 6.0–8.3)

## 2014-06-13 MED ORDER — DEXAMETHASONE SODIUM PHOSPHATE 10 MG/ML IJ SOLN
INTRAMUSCULAR | Status: AC
  Filled 2014-06-13: qty 1

## 2014-06-13 MED ORDER — DEXAMETHASONE SODIUM PHOSPHATE 10 MG/ML IJ SOLN
10.0000 mg | Freq: Once | INTRAMUSCULAR | Status: AC
  Administered 2014-06-13: 10 mg via INTRAVENOUS

## 2014-06-13 MED ORDER — SODIUM CHLORIDE 0.9 % IJ SOLN
10.0000 mL | INTRAMUSCULAR | Status: DC | PRN
  Administered 2014-06-13: 10 mL via INTRAVENOUS
  Filled 2014-06-13: qty 10

## 2014-06-13 MED ORDER — ONDANSETRON 8 MG/50ML IVPB (CHCC)
8.0000 mg | Freq: Once | INTRAVENOUS | Status: AC
  Administered 2014-06-13: 8 mg via INTRAVENOUS

## 2014-06-13 MED ORDER — LOSARTAN POTASSIUM 100 MG PO TABS: 100.0000 mg | ORAL_TABLET | Freq: Every day | ORAL | Status: DC

## 2014-06-13 MED ORDER — ONDANSETRON 8 MG/NS 50 ML IVPB
INTRAVENOUS | Status: AC
  Filled 2014-06-13: qty 8

## 2014-06-13 MED ORDER — HEPARIN SOD (PORK) LOCK FLUSH 100 UNIT/ML IV SOLN
500.0000 [IU] | Freq: Once | INTRAVENOUS | Status: AC | PRN
Start: 2014-06-13 — End: 2014-06-13
  Administered 2014-06-13: 500 [IU]
  Filled 2014-06-13: qty 5

## 2014-06-13 MED ORDER — SODIUM CHLORIDE 0.9 % IJ SOLN
10.0000 mL | INTRAMUSCULAR | Status: DC | PRN
  Administered 2014-06-13: 10 mL
  Filled 2014-06-13: qty 10

## 2014-06-13 MED ORDER — SODIUM CHLORIDE 0.9 % IV SOLN
Freq: Once | INTRAVENOUS | Status: AC
  Administered 2014-06-13: 10:00:00 via INTRAVENOUS

## 2014-06-13 MED ORDER — PACLITAXEL PROTEIN-BOUND CHEMO INJECTION 100 MG
100.0000 mg/m2 | Freq: Once | INTRAVENOUS | Status: AC
  Administered 2014-06-13: 175 mg via INTRAVENOUS
  Filled 2014-06-13: qty 35

## 2014-06-13 NOTE — Progress Notes (Signed)
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  (16)  Skin lesion, right arm  (17)  thyroid nodule - biopsy 02/28/2014 benign   (18) Abraxane to be continued now with carboplatin cycle 6 day 8 today. She has mild transaminitis due to chemo but we will continue to monitor her liver functions, bilirubin is fine.  (19)  Dysphagia - swallowing study shows esophageal narrowing, no mass; S/P esophageal dilatation with improvement.   (20) Anemia - Due to chemotherapy. Asymptomatic.   (21)  GERD -  minimal improvement on omeprazole, being increased to Protonix  (22) I gave her a paxil script  few weeks ago but patient admitted she has not started it yet.  (23) We gave a script for Cozaar 100 mg po daily (30)  (24)Next Zometa would be August 25th 2015. q 6 weeks.  [place] has a good understanding of the overall plan. She agrees with it. She knows the goal of treatment in her case is control. At some point we will switch her to exemestane/everolimus or exemestane/Ibrance. We will make the decision after the next staging studies post-completion of 8 cycles of current regimen.   Bernadene Bell, MD Medical Hematologist/Oncologist [place] Pager: [phone] Office No: [phone] 05/16/2014

## 2014-06-13 NOTE — Progress Notes (Signed)
OK to treat with lab values of 06/13/14 per Dr. Lona Kettle.

## 2014-06-13 NOTE — Patient Instructions (Signed)

## 2014-06-13 NOTE — Patient Instructions (Signed)
[facility] Discharge Instructions for Patients Receiving Chemotherapy  Today you received the following chemotherapy agents :  Abraxane.  To help prevent nausea and vomiting after your treatment, we encourage you to take your nausea medication as instructed by your physician.   If you develop nausea and vomiting that is not controlled by your nausea medication, call the clinic.   BELOW ARE SYMPTOMS THAT SHOULD BE REPORTED IMMEDIATELY:  *FEVER GREATER THAN 100.5 F  *CHILLS WITH OR WITHOUT FEVER  NAUSEA AND VOMITING THAT IS NOT CONTROLLED WITH YOUR NAUSEA MEDICATION  *UNUSUAL SHORTNESS OF BREATH  *UNUSUAL BRUISING OR BLEEDING  TENDERNESS IN MOUTH AND THROAT WITH OR WITHOUT PRESENCE OF ULCERS  *URINARY PROBLEMS  *BOWEL PROBLEMS  UNUSUAL RASH Items with * indicate a potential emergency and should be followed up as soon as possible.  Feel free to call the clinic you have any questions or concerns. The clinic phone number is [phone].    

## 2014-06-14 ENCOUNTER — Encounter: Payer: Self-pay | Admitting: Oncology

## 2014-06-14 NOTE — Progress Notes (Signed)
[employer] [employer] approved losartan potassium 100mg  from 06/14/14-06/15/15 pa # [id number]

## 2014-06-27 ENCOUNTER — Encounter: Payer: Self-pay | Admitting: Nurse Practitioner

## 2014-06-27 ENCOUNTER — Other Ambulatory Visit (HOSPITAL_BASED_OUTPATIENT_CLINIC_OR_DEPARTMENT_OTHER): Payer: Medicaid Other

## 2014-06-27 ENCOUNTER — Ambulatory Visit: Payer: Medicaid Other

## 2014-06-27 ENCOUNTER — Telehealth: Payer: Self-pay | Admitting: Nurse Practitioner

## 2014-06-27 ENCOUNTER — Ambulatory Visit (HOSPITAL_BASED_OUTPATIENT_CLINIC_OR_DEPARTMENT_OTHER): Payer: Medicaid Other | Admitting: Nurse Practitioner

## 2014-06-27 VITALS — BP 126/85 | HR 87 | Temp 98.1°F | Resp 18 | Ht 63.0 in | Wt 157.3 lb

## 2014-06-27 DIAGNOSIS — C7989 Secondary malignant neoplasm of other specified sites: Principal | ICD-10-CM

## 2014-06-27 DIAGNOSIS — C7951 Secondary malignant neoplasm of bone: Secondary | ICD-10-CM

## 2014-06-27 DIAGNOSIS — C7952 Secondary malignant neoplasm of bone marrow: Secondary | ICD-10-CM

## 2014-06-27 DIAGNOSIS — E876 Hypokalemia: Secondary | ICD-10-CM

## 2014-06-27 DIAGNOSIS — K219 Gastro-esophageal reflux disease without esophagitis: Secondary | ICD-10-CM

## 2014-06-27 DIAGNOSIS — C792 Secondary malignant neoplasm of skin: Secondary | ICD-10-CM

## 2014-06-27 DIAGNOSIS — T451X5A Adverse effect of antineoplastic and immunosuppressive drugs, initial encounter: Secondary | ICD-10-CM

## 2014-06-27 DIAGNOSIS — C787 Secondary malignant neoplasm of liver and intrahepatic bile duct: Secondary | ICD-10-CM

## 2014-06-27 DIAGNOSIS — L989 Disorder of the skin and subcutaneous tissue, unspecified: Secondary | ICD-10-CM

## 2014-06-27 DIAGNOSIS — C50419 Malignant neoplasm of upper-outer quadrant of unspecified female breast: Secondary | ICD-10-CM

## 2014-06-27 DIAGNOSIS — Z95828 Presence of other vascular implants and grafts: Secondary | ICD-10-CM

## 2014-06-27 DIAGNOSIS — C50919 Malignant neoplasm of unspecified site of unspecified female breast: Secondary | ICD-10-CM

## 2014-06-27 DIAGNOSIS — C779 Secondary and unspecified malignant neoplasm of lymph node, unspecified: Secondary | ICD-10-CM

## 2014-06-27 DIAGNOSIS — D6481 Anemia due to antineoplastic chemotherapy: Secondary | ICD-10-CM

## 2014-06-27 DIAGNOSIS — R748 Abnormal levels of other serum enzymes: Secondary | ICD-10-CM

## 2014-06-27 DIAGNOSIS — K5900 Constipation, unspecified: Secondary | ICD-10-CM

## 2014-06-27 DIAGNOSIS — R61 Generalized hyperhidrosis: Secondary | ICD-10-CM

## 2014-06-27 DIAGNOSIS — R1310 Dysphagia, unspecified: Secondary | ICD-10-CM

## 2014-06-27 DIAGNOSIS — F329 Major depressive disorder, single episode, unspecified: Secondary | ICD-10-CM

## 2014-06-27 DIAGNOSIS — F3289 Other specified depressive episodes: Secondary | ICD-10-CM

## 2014-06-27 DIAGNOSIS — R7989 Other specified abnormal findings of blood chemistry: Secondary | ICD-10-CM

## 2014-06-27 LAB — CBC WITH DIFFERENTIAL/PLATELET
BASO%: 0.6 % (ref 0.0–2.0)
BASOS ABS: 0 10*3/uL (ref 0.0–0.1)
EOS ABS: 0.1 10*3/uL (ref 0.0–0.5)
EOS%: 2.3 % (ref 0.0–7.0)
HCT: 34.8 % (ref 34.8–46.6)
HEMOGLOBIN: 11.4 g/dL — AB (ref 11.6–15.9)
LYMPH#: 1.2 10*3/uL (ref 0.9–3.3)
LYMPH%: 27.4 % (ref 14.0–49.7)
MCH: 29.6 pg (ref 25.1–34.0)
MCHC: 32.7 g/dL (ref 31.5–36.0)
MCV: 90.6 fL (ref 79.5–101.0)
MONO#: 0.5 10*3/uL (ref 0.1–0.9)
MONO%: 12.3 % (ref 0.0–14.0)
NEUT#: 2.4 10*3/uL (ref 1.5–6.5)
NEUT%: 57.4 % (ref 38.4–76.8)
Platelets: 262 10*3/uL (ref 145–400)
RBC: 3.84 10*6/uL (ref 3.70–5.45)
RDW: 17.8 % — AB (ref 11.2–14.5)
WBC: 4.3 10*3/uL (ref 3.9–10.3)

## 2014-06-27 LAB — COMPREHENSIVE METABOLIC PANEL (CC13)
ALBUMIN: 3.5 g/dL (ref 3.5–5.0)
ALT: 129 U/L — ABNORMAL HIGH (ref 0–55)
AST: 71 U/L — AB (ref 5–34)
Alkaline Phosphatase: 230 U/L — ABNORMAL HIGH (ref 40–150)
Anion Gap: 9 mEq/L (ref 3–11)
BUN: 7 mg/dL (ref 7.0–26.0)
CHLORIDE: 109 meq/L (ref 98–109)
CO2: 24 mEq/L (ref 22–29)
Calcium: 9.7 mg/dL (ref 8.4–10.4)
Creatinine: 0.6 mg/dL (ref 0.6–1.1)
Glucose: 111 mg/dl (ref 70–140)
POTASSIUM: 3.3 meq/L — AB (ref 3.5–5.1)
Sodium: 141 mEq/L (ref 136–145)
Total Bilirubin: 0.59 mg/dL (ref 0.20–1.20)
Total Protein: 6.8 g/dL (ref 6.4–8.3)

## 2014-06-27 MED ORDER — SODIUM CHLORIDE 0.9 % IJ SOLN
10.0000 mL | INTRAMUSCULAR | Status: DC | PRN
  Administered 2014-06-27: 10 mL via INTRAVENOUS
  Filled 2014-06-27: qty 10

## 2014-06-27 NOTE — Progress Notes (Signed)
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  (16)  Skin lesion, right arm and sternum, now on left chest wall  (17)  thyroid nodule - biopsy 02/28/2014 benign   (18) Abraxane with carboplatin cycle 6 completed 06/13/14 . STOPPED 06/27/14 because of increasing transaminitis   (19)  Dysphagia - swallowing study shows esophageal narrowing, no mass; S/P esophageal dilatation with improvement.   (20) Anemia - Due to chemotherapy. Asymptomatic.   (21)  GERD - minimal improvement on omeprazole, being increased to Protonix  (22) I gave her a paxil script few weeks ago but patient admitted she has not  started it yet.  (23) We gave a script for Cozaar 100  mg po daily (30)  (24)Next Zometa would be Sept 1st 2015. q 6 weeks.  PLAN:  The labs were reviewed in detail with Caren Griffins. Her alkaline phosphatase, AST, and ALT have all seen significant increases in the past 2 weeks. This causes concern that the abraxane and carboplatin are ineffective. This combined with the new subcutaneous nodule palpated in the left chest wall suggest the cancer is continuing to spread despite treatment. Marise needs a repeat chest and abdominal pelvic CT scan in the next couple of days to confirm these ideas and they were ordered during this visit. We will hold treatment today. She will return to clinic to discuss the results of these scans with Dr. [name] and possible decide a different course of action.   Her next zometa infusion is due on Sept 1st. She will continue her bowel management routine at home. She will start back taking her potassium supplements 42mq daily as her potassium has seen an decrease lately and she hadn't been taking it.  CTalithaunderstands and agrees with this plan. She knows a goal of treatment in her case is control and that there is not a cure for metastatic disease. She has been encouraged to call with any issues that might arise before her next visit here.   FMarcelino Duster NP 06/27/2014

## 2014-06-27 NOTE — Telephone Encounter (Signed)
per pof to sch pt aopt-sent back to lab to have port deaccessed-adv CS will call w/CT appt-pt understood  °   °  °   °  °   ° ° °

## 2014-06-28 ENCOUNTER — Encounter (HOSPITAL_COMMUNITY): Payer: Self-pay

## 2014-06-28 ENCOUNTER — Ambulatory Visit (HOSPITAL_COMMUNITY)
Admission: RE | Admit: 2014-06-28 | Discharge: 2014-06-28 | Disposition: A | Discharge status: Home / Self Care | Payer: Medicaid Other | Source: Ambulatory Visit | Attending: Nurse Practitioner | Admitting: Nurse Practitioner

## 2014-06-28 DIAGNOSIS — C7951 Secondary malignant neoplasm of bone: Secondary | ICD-10-CM | POA: Diagnosis not present

## 2014-06-28 DIAGNOSIS — C7952 Secondary malignant neoplasm of bone marrow: Secondary | ICD-10-CM | POA: Diagnosis not present

## 2014-06-28 DIAGNOSIS — C50919 Malignant neoplasm of unspecified site of unspecified female breast: Secondary | ICD-10-CM

## 2014-06-28 DIAGNOSIS — C7989 Secondary malignant neoplasm of other specified sites: Secondary | ICD-10-CM

## 2014-06-28 DIAGNOSIS — C787 Secondary malignant neoplasm of liver and intrahepatic bile duct: Secondary | ICD-10-CM | POA: Diagnosis not present

## 2014-06-28 MED ORDER — IOHEXOL 300 MG/ML  SOLN
100.0000 mL | Freq: Once | INTRAMUSCULAR | Status: AC | PRN
  Administered 2014-06-28: 100 mL via INTRAVENOUS

## 2014-06-30 ENCOUNTER — Ambulatory Visit (HOSPITAL_BASED_OUTPATIENT_CLINIC_OR_DEPARTMENT_OTHER): Payer: Medicaid Other | Admitting: Oncology

## 2014-06-30 ENCOUNTER — Telehealth: Payer: Self-pay | Admitting: *Deleted

## 2014-06-30 ENCOUNTER — Telehealth: Payer: Self-pay | Admitting: Oncology

## 2014-06-30 ENCOUNTER — Other Ambulatory Visit: Payer: Self-pay | Admitting: *Deleted

## 2014-06-30 VITALS — BP 121/96 | HR 93 | Temp 98.1°F | Resp 18 | Ht 63.0 in | Wt 155.3 lb

## 2014-06-30 DIAGNOSIS — C792 Secondary malignant neoplasm of skin: Secondary | ICD-10-CM

## 2014-06-30 DIAGNOSIS — C7952 Secondary malignant neoplasm of bone marrow: Secondary | ICD-10-CM

## 2014-06-30 DIAGNOSIS — C7982 Secondary malignant neoplasm of genital organs: Secondary | ICD-10-CM

## 2014-06-30 DIAGNOSIS — C7989 Secondary malignant neoplasm of other specified sites: Secondary | ICD-10-CM

## 2014-06-30 DIAGNOSIS — E049 Nontoxic goiter, unspecified: Secondary | ICD-10-CM

## 2014-06-30 DIAGNOSIS — C50419 Malignant neoplasm of upper-outer quadrant of unspecified female breast: Secondary | ICD-10-CM

## 2014-06-30 DIAGNOSIS — C7960 Secondary malignant neoplasm of unspecified ovary: Secondary | ICD-10-CM

## 2014-06-30 DIAGNOSIS — C7951 Secondary malignant neoplasm of bone: Secondary | ICD-10-CM

## 2014-06-30 DIAGNOSIS — Z170 Estrogen receptor positive status [ER+]: Secondary | ICD-10-CM

## 2014-06-30 DIAGNOSIS — C50411 Malignant neoplasm of upper-outer quadrant of right female breast: Secondary | ICD-10-CM

## 2014-06-30 DIAGNOSIS — C762 Malignant neoplasm of abdomen: Secondary | ICD-10-CM

## 2014-06-30 DIAGNOSIS — C50919 Malignant neoplasm of unspecified site of unspecified female breast: Secondary | ICD-10-CM

## 2014-06-30 DIAGNOSIS — C786 Secondary malignant neoplasm of retroperitoneum and peritoneum: Secondary | ICD-10-CM
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
## 2014-06-30 NOTE — Telephone Encounter (Signed)
Per staff message and POF I have scheduled appts. Advised scheduler of appts. JMW  

## 2014-06-30 NOTE — Telephone Encounter (Signed)
, °

## 2014-07-02 MED ORDER — PROCHLORPERAZINE MALEATE 10 MG PO TABS: 10.0000 mg | ORAL_TABLET | Freq: Four times a day (QID) | ORAL | Status: DC | PRN

## 2014-07-02 MED ORDER — DEXAMETHASONE 4 MG PO TABS: 8.0000 mg | ORAL_TABLET | Freq: Two times a day (BID) | ORAL | Status: DC

## 2014-07-02 MED ORDER — ONDANSETRON HCL 8 MG PO TABS: 8.0000 mg | ORAL_TABLET | Freq: Two times a day (BID) | ORAL | Status: DC

## 2014-07-02 MED ORDER — LORAZEPAM 0.5 MG PO TABS: 0.5000 mg | ORAL_TABLET | Freq: Every evening | ORAL | Status: DC | PRN

## 2014-07-03 ENCOUNTER — Other Ambulatory Visit: Payer: Self-pay | Admitting: *Deleted

## 2014-07-03 ENCOUNTER — Telehealth: Payer: Self-pay | Admitting: Nurse Practitioner

## 2014-07-03 DIAGNOSIS — C50919 Malignant neoplasm of unspecified site of unspecified female breast: Secondary | ICD-10-CM

## 2014-07-03 DIAGNOSIS — C50411 Malignant neoplasm of upper-outer quadrant of right female breast: Secondary | ICD-10-CM

## 2014-07-03 DIAGNOSIS — C7951 Secondary malignant neoplasm of bone: Secondary | ICD-10-CM

## 2014-07-03 DIAGNOSIS — C7989 Secondary malignant neoplasm of other specified sites: Principal | ICD-10-CM

## 2014-07-03 DIAGNOSIS — C762 Malignant neoplasm of abdomen: Secondary | ICD-10-CM

## 2014-07-03 MED ORDER — LORAZEPAM 0.5 MG PO TABS: 0.5000 mg | ORAL_TABLET | Freq: Every evening | ORAL | Status: DC | PRN

## 2014-07-03 NOTE — Telephone Encounter (Signed)
, °

## 2014-07-04 ENCOUNTER — Other Ambulatory Visit (HOSPITAL_BASED_OUTPATIENT_CLINIC_OR_DEPARTMENT_OTHER): Payer: Medicaid Other

## 2014-07-04 ENCOUNTER — Ambulatory Visit: Payer: Medicaid Other

## 2014-07-04 ENCOUNTER — Ambulatory Visit (HOSPITAL_BASED_OUTPATIENT_CLINIC_OR_DEPARTMENT_OTHER): Payer: Medicaid Other

## 2014-07-04 ENCOUNTER — Telehealth: Payer: Self-pay | Admitting: Nurse Practitioner

## 2014-07-04 VITALS — BP 120/88 | HR 83 | Temp 98.4°F

## 2014-07-04 DIAGNOSIS — C50419 Malignant neoplasm of upper-outer quadrant of unspecified female breast: Secondary | ICD-10-CM

## 2014-07-04 DIAGNOSIS — C50411 Malignant neoplasm of upper-outer quadrant of right female breast: Secondary | ICD-10-CM

## 2014-07-04 DIAGNOSIS — C762 Malignant neoplasm of abdomen: Secondary | ICD-10-CM

## 2014-07-04 DIAGNOSIS — Z95828 Presence of other vascular implants and grafts: Secondary | ICD-10-CM

## 2014-07-04 DIAGNOSIS — C7951 Secondary malignant neoplasm of bone: Secondary | ICD-10-CM

## 2014-07-04 DIAGNOSIS — Z5111 Encounter for antineoplastic chemotherapy: Secondary | ICD-10-CM

## 2014-07-04 DIAGNOSIS — C7989 Secondary malignant neoplasm of other specified sites: Principal | ICD-10-CM

## 2014-07-04 DIAGNOSIS — C50919 Malignant neoplasm of unspecified site of unspecified female breast: Secondary | ICD-10-CM

## 2014-07-04 DIAGNOSIS — C7952 Secondary malignant neoplasm of bone marrow: Secondary | ICD-10-CM

## 2014-07-04 LAB — COMPREHENSIVE METABOLIC PANEL (CC13)
ALK PHOS: 310 U/L — AB (ref 40–150)
ALT: 170 U/L — AB (ref 0–55)
ANION GAP: 7 meq/L (ref 3–11)
AST: 99 U/L — AB (ref 5–34)
Albumin: 3.5 g/dL (ref 3.5–5.0)
BILIRUBIN TOTAL: 0.71 mg/dL (ref 0.20–1.20)
BUN: 12.9 mg/dL (ref 7.0–26.0)
CO2: 24 mEq/L (ref 22–29)
Calcium: 10 mg/dL (ref 8.4–10.4)
Chloride: 107 mEq/L (ref 98–109)
Creatinine: 0.7 mg/dL (ref 0.6–1.1)
Glucose: 97 mg/dl (ref 70–140)
Potassium: 3.6 mEq/L (ref 3.5–5.1)
SODIUM: 138 meq/L (ref 136–145)
TOTAL PROTEIN: 7.2 g/dL (ref 6.4–8.3)

## 2014-07-04 LAB — CBC WITH DIFFERENTIAL/PLATELET
BASO%: 0.2 % (ref 0.0–2.0)
BASOS ABS: 0 10*3/uL (ref 0.0–0.1)
EOS ABS: 0.2 10*3/uL (ref 0.0–0.5)
EOS%: 4.2 % (ref 0.0–7.0)
HCT: 36.4 % (ref 34.8–46.6)
HEMOGLOBIN: 11.7 g/dL (ref 11.6–15.9)
LYMPH%: 29.8 % (ref 14.0–49.7)
MCH: 28.8 pg (ref 25.1–34.0)
MCHC: 32.1 g/dL (ref 31.5–36.0)
MCV: 89.7 fL (ref 79.5–101.0)
MONO#: 0.5 10*3/uL (ref 0.1–0.9)
MONO%: 10.7 % (ref 0.0–14.0)
NEUT#: 2.5 10*3/uL (ref 1.5–6.5)
NEUT%: 55.1 % (ref 38.4–76.8)
PLATELETS: 234 10*3/uL (ref 145–400)
RBC: 4.06 10*6/uL (ref 3.70–5.45)
RDW: 16.3 % — ABNORMAL HIGH (ref 11.2–14.5)
WBC: 4.5 10*3/uL (ref 3.9–10.3)
lymph#: 1.3 10*3/uL (ref 0.9–3.3)

## 2014-07-04 MED ORDER — DEXAMETHASONE SODIUM PHOSPHATE 10 MG/ML IJ SOLN
10.0000 mg | Freq: Once | INTRAMUSCULAR | Status: AC
  Administered 2014-07-04: 10 mg via INTRAVENOUS

## 2014-07-04 MED ORDER — SODIUM CHLORIDE 0.9 % IV SOLN
Freq: Once | INTRAVENOUS | Status: AC
  Administered 2014-07-04: 10:00:00 via INTRAVENOUS

## 2014-07-04 MED ORDER — ONDANSETRON 8 MG/NS 50 ML IVPB
INTRAVENOUS | Status: AC
  Filled 2014-07-04: qty 8

## 2014-07-04 MED ORDER — SODIUM CHLORIDE 0.9 % IV SOLN
1.4000 mg/m2 | Freq: Once | INTRAVENOUS | Status: AC
  Administered 2014-07-04: 2.5 mg via INTRAVENOUS
  Filled 2014-07-04: qty 5

## 2014-07-04 MED ORDER — ONDANSETRON 8 MG/50ML IVPB (CHCC)
8.0000 mg | Freq: Once | INTRAVENOUS | Status: AC
  Administered 2014-07-04: 8 mg via INTRAVENOUS

## 2014-07-04 MED ORDER — SODIUM CHLORIDE 0.9 % IJ SOLN
10.0000 mL | INTRAMUSCULAR | Status: DC | PRN
  Administered 2014-07-04: 10 mL
  Filled 2014-07-04: qty 10

## 2014-07-04 MED ORDER — SODIUM CHLORIDE 0.9 % IJ SOLN
10.0000 mL | INTRAMUSCULAR | Status: DC | PRN
  Administered 2014-07-04: 10 mL via INTRAVENOUS
  Filled 2014-07-04: qty 10

## 2014-07-04 MED ORDER — DEXAMETHASONE SODIUM PHOSPHATE 10 MG/ML IJ SOLN
INTRAMUSCULAR | Status: AC
  Filled 2014-07-04: qty 1

## 2014-07-04 MED ORDER — HEPARIN SOD (PORK) LOCK FLUSH 100 UNIT/ML IV SOLN
500.0000 [IU] | Freq: Once | INTRAVENOUS | Status: AC | PRN
  Administered 2014-07-04: 500 [IU]
  Filled 2014-07-04: qty 5

## 2014-07-04 NOTE — Patient Instructions (Signed)
[facility] Discharge Instructions for Patients Receiving Chemotherapy  Today you received the following chemotherapy agents halaven  To help prevent nausea and vomiting after your treatment, we encourage you to take your nausea medication as directed   If you develop nausea and vomiting that is not controlled by your nausea medication, call the clinic.   BELOW ARE SYMPTOMS THAT SHOULD BE REPORTED IMMEDIATELY:  *FEVER GREATER THAN 100.5 F  *CHILLS WITH OR WITHOUT FEVER  NAUSEA AND VOMITING THAT IS NOT CONTROLLED WITH YOUR NAUSEA MEDICATION  *UNUSUAL SHORTNESS OF BREATH  *UNUSUAL BRUISING OR BLEEDING  TENDERNESS IN MOUTH AND THROAT WITH OR WITHOUT PRESENCE OF ULCERS  *URINARY PROBLEMS  *BOWEL PROBLEMS  UNUSUAL RASH Items with * indicate a potential emergency and should be followed up as soon as possible.  Feel free to call the clinic you have any questions or concerns. The clinic phone number is [phone].  Eribulin solution for injection What is this medicine? ERIBULIN is a chemotherapy drug. It is used to treat breast cancer. This medicine may be used for other purposes; ask your health care provider or pharmacist if you have questions. COMMON BRAND NAME(S): Halaven What should I tell my health care provider before I take this medicine? They need to know if you have any of these conditions: -heart disease -kidney disease -liver disease -low blood counts, like low white cell, platelet, or red cell counts -an unusual or allergic reaction to eribulin, other medicines, foods, dyes, or preservatives -pregnant or trying to get pregnant -breast-feeding How should I use this medicine? This medicine is for infusion into a vein. It is given by a health care professional in a hospital or clinic setting. Talk to your pediatrician regarding the use of this medicine in children. Special care may be needed. Overdosage: If you think you've taken too much of this  medicine contact a poison control center or emergency room at once. Overdosage: If you think you have taken too much of this medicine contact a poison control center or emergency room at once. NOTE: This medicine is only for you. Do not share this medicine with others. What if I miss a dose? It is important not to miss your dose. Call your doctor or health care professional if you are unable to keep an appointment. What may interact with this medicine? Do not take this medicine with any of the following medications: -amiodarone -astemizole -arsenic trioxide -bepridil -bretylium -chloroquine -chlorpromazine -cisapride -clarithromycin -dextromethorphan, quinidine -disopyramide -dofetilide -droperidol -dronedarone -erythromycin -grepafloxacin -halofantrine -haloperidol -ibutilide -levomethadyl -mesoridazine -methadone -pentamidine -procainamide -quinidine -pimozide -posaconazole -probucol -propafenone -saquinavir -sotalol -sparfloxacin -terfenadine -thioridazine -troleandomycin -ziprasidone This list may not describe all possible interactions. Give your health care provider a list of all the medicines, herbs, non-prescription drugs, or dietary supplements you use. Also tell them if you smoke, drink alcohol, or use illegal drugs. Some items may interact with your medicine. What should I watch for while using this medicine? Your condition will be monitored carefully while you are receiving this medicine. This drug may make you feel generally unwell. This is not uncommon, as chemotherapy can affect healthy cells as well as cancer cells. Report any side effects. Continue your course of treatment even though you feel ill unless your doctor tells you to stop. Call your doctor or health care professional for advice if you get a fever, chills or sore throat, or other symptoms of a cold or flu. Do not treat yourself. This drug decreases your body's ability to   fight infections. Try  to avoid being around people who are sick. This medicine may increase your risk to bruise or bleed. Call your doctor or health care professional if you notice any unusual bleeding. Be careful brushing and flossing your teeth or using a toothpick because you may get an infection or bleed more easily. If you have any dental work done, tell your dentist you are receiving this medicine. Avoid taking products that contain aspirin, acetaminophen, ibuprofen, naproxen, or ketoprofen unless instructed by your doctor. These medicines may hide a fever. Do not become pregnant while taking this medicine. Women should inform their doctor if they wish to become pregnant or think they might be pregnant. There is a potential for serious side effects to an unborn child. Talk to your health care professional or pharmacist for more information. Do not breast-feed an infant while taking this medicine. What side effects may I notice from receiving this medicine? Side effects that you should report to your doctor or health care professional as soon as possible: -allergic reactions like skin rash, itching or hives, swelling of the face, lips, or tongue -low blood counts - this medicine may decrease the number of white blood cells, red blood cells and platelets. You may be at increased risk for infections and bleeding. -signs of infection - fever or chills, cough, sore throat, pain or difficulty passing urine -signs of decreased platelets or bleeding - bruising, pinpoint red spots on the skin, black, tarry stools, blood in the urine -signs of decreased red blood cells - unusually weak or tired, fainting spells, lightheadedness -pain, tingling, numbness in the hands or feet Side effects that usually do not require medical attention (Report these to your doctor or health care professional if they continue or are bothersome.): -constipation -hair loss -headache -loss of appetite -muscle or joint pain -nausea,  vomiting -stomach pain This list may not describe all possible side effects. Call your doctor for medical advice about side effects. You may report side effects to FDA at 1-800-FDA-1088. Where should I keep my medicine? This drug is given in a hospital or clinic and will not be stored at home. NOTE: This sheet is a summary. It may not cover all possible information. If you have questions about this medicine, talk to your doctor, pharmacist, or health care provider.  2015, [employer]. (2009-10-11 23:04:37)   

## 2014-07-04 NOTE — Telephone Encounter (Signed)
pt came in for copy of sch

## 2014-07-04 NOTE — Patient Instructions (Signed)

## 2014-07-05 ENCOUNTER — Telehealth: Payer: Self-pay | Admitting: *Deleted

## 2014-07-05 NOTE — Telephone Encounter (Signed)
PT. IS EATING AND FORCING FLUIDS. NO NAUSEA OR VOMITING. NO PROBLEMS WITH DIARRHEA OR CONSTIPATION. YESTERDAY AFTERNOON PT. TASTED BLOOD IN HER MOUTH. SHE SPAT UP A TEASPOON OF BLOOD IN HER SALIVA ONE TIME. PT. DID NOT EXPERIENCE ANY BLEEDING OR BRUISING  YESTERDAY OR TODAY. NO OTHER MOUTH ISSUES. NOTIFIED DR.MAGRINAT. VERBAL ORDER AND READ BACK TO DR.MAGRINAT- THE BLOOD PT. SPAT UP WAS NOT RELATED TO HER CHEMOTHERAPY. PT. TO MONITOR THIS SITUATION. IF PT. DOES HAVE A BLEEDING PROBLEM SHE IS TO CALL THIS OFFICE. IF THE BLEEDING IS A LARGE AMOUNT OR FOR A EXTENDED PERIOD OF TIME PT. IS TO GO TO THE EMERGENCY ROOM FOR AN EVALUATION. NOTIFIED PT. OF THE ABOVE INSTRUCTIONS AND INFORMED HER THERE IS A PHYSICIAN ON CALL 24/7. SHE VOICES UNDERSTANDING.

## 2014-07-05 NOTE — Telephone Encounter (Signed)
Called pt and left message on voice mail requesting a call back for post chemo follow up.

## 2014-07-11 ENCOUNTER — Other Ambulatory Visit (HOSPITAL_BASED_OUTPATIENT_CLINIC_OR_DEPARTMENT_OTHER): Payer: Medicaid Other

## 2014-07-11 ENCOUNTER — Ambulatory Visit (HOSPITAL_BASED_OUTPATIENT_CLINIC_OR_DEPARTMENT_OTHER): Payer: Medicaid Other | Admitting: Hematology

## 2014-07-11 ENCOUNTER — Ambulatory Visit (HOSPITAL_BASED_OUTPATIENT_CLINIC_OR_DEPARTMENT_OTHER): Payer: Medicaid Other

## 2014-07-11 ENCOUNTER — Other Ambulatory Visit (HOSPITAL_COMMUNITY)
Admission: RE | Admit: 2014-07-11 | Discharge: 2014-07-11 | Disposition: A | Discharge status: Home / Self Care | Payer: Medicaid Other | Source: Ambulatory Visit | Attending: Internal Medicine | Admitting: Internal Medicine

## 2014-07-11 ENCOUNTER — Telehealth: Payer: Self-pay | Admitting: Oncology

## 2014-07-11 ENCOUNTER — Other Ambulatory Visit: Payer: Medicaid Other

## 2014-07-11 ENCOUNTER — Ambulatory Visit: Payer: Medicaid Other

## 2014-07-11 ENCOUNTER — Ambulatory Visit: Payer: Medicaid Other | Attending: Internal Medicine | Admitting: Internal Medicine

## 2014-07-11 ENCOUNTER — Encounter: Payer: Self-pay | Admitting: Hematology

## 2014-07-11 VITALS — BP 135/90 | HR 93 | Temp 99.2°F | Resp 18 | Ht 63.0 in | Wt 156.3 lb

## 2014-07-11 DIAGNOSIS — Z01419 Encounter for gynecological examination (general) (routine) without abnormal findings: Secondary | ICD-10-CM | POA: Insufficient documentation

## 2014-07-11 DIAGNOSIS — C50411 Malignant neoplasm of upper-outer quadrant of right female breast: Secondary | ICD-10-CM

## 2014-07-11 DIAGNOSIS — Z1151 Encounter for screening for human papillomavirus (HPV): Secondary | ICD-10-CM | POA: Diagnosis present

## 2014-07-11 DIAGNOSIS — N760 Acute vaginitis: Secondary | ICD-10-CM | POA: Insufficient documentation

## 2014-07-11 DIAGNOSIS — C7951 Secondary malignant neoplasm of bone: Secondary | ICD-10-CM

## 2014-07-11 DIAGNOSIS — C7989 Secondary malignant neoplasm of other specified sites: Principal | ICD-10-CM

## 2014-07-11 DIAGNOSIS — C779 Secondary and unspecified malignant neoplasm of lymph node, unspecified: Secondary | ICD-10-CM

## 2014-07-11 DIAGNOSIS — C7960 Secondary malignant neoplasm of unspecified ovary: Secondary | ICD-10-CM

## 2014-07-11 DIAGNOSIS — C762 Malignant neoplasm of abdomen: Secondary | ICD-10-CM

## 2014-07-11 DIAGNOSIS — C7952 Secondary malignant neoplasm of bone marrow: Secondary | ICD-10-CM

## 2014-07-11 DIAGNOSIS — C50919 Malignant neoplasm of unspecified site of unspecified female breast: Secondary | ICD-10-CM

## 2014-07-11 DIAGNOSIS — Z95828 Presence of other vascular implants and grafts: Secondary | ICD-10-CM

## 2014-07-11 DIAGNOSIS — T451X5A Adverse effect of antineoplastic and immunosuppressive drugs, initial encounter: Secondary | ICD-10-CM

## 2014-07-11 DIAGNOSIS — N951 Menopausal and female climacteric states: Secondary | ICD-10-CM

## 2014-07-11 DIAGNOSIS — R1310 Dysphagia, unspecified: Secondary | ICD-10-CM

## 2014-07-11 DIAGNOSIS — I10 Essential (primary) hypertension: Secondary | ICD-10-CM | POA: Insufficient documentation

## 2014-07-11 DIAGNOSIS — C792 Secondary malignant neoplasm of skin: Secondary | ICD-10-CM

## 2014-07-11 DIAGNOSIS — D6481 Anemia due to antineoplastic chemotherapy: Secondary | ICD-10-CM

## 2014-07-11 DIAGNOSIS — L989 Disorder of the skin and subcutaneous tissue, unspecified: Secondary | ICD-10-CM

## 2014-07-11 DIAGNOSIS — C786 Secondary malignant neoplasm of retroperitoneum and peritoneum: Secondary | ICD-10-CM

## 2014-07-11 DIAGNOSIS — Z124 Encounter for screening for malignant neoplasm of cervix: Secondary | ICD-10-CM

## 2014-07-11 DIAGNOSIS — Z113 Encounter for screening for infections with a predominantly sexual mode of transmission: Secondary | ICD-10-CM | POA: Diagnosis present

## 2014-07-11 DIAGNOSIS — E041 Nontoxic single thyroid nodule: Secondary | ICD-10-CM

## 2014-07-11 DIAGNOSIS — C50419 Malignant neoplasm of upper-outer quadrant of unspecified female breast: Secondary | ICD-10-CM

## 2014-07-11 DIAGNOSIS — C7982 Secondary malignant neoplasm of genital organs: Secondary | ICD-10-CM

## 2014-07-11 LAB — CBC WITH DIFFERENTIAL/PLATELET
BASO%: 1.5 % (ref 0.0–2.0)
Basophils Absolute: 0.1 10*3/uL (ref 0.0–0.1)
EOS ABS: 0 10*3/uL (ref 0.0–0.5)
EOS%: 0.7 % (ref 0.0–7.0)
HEMATOCRIT: 35.3 % (ref 34.8–46.6)
HGB: 11.4 g/dL — ABNORMAL LOW (ref 11.6–15.9)
LYMPH%: 45 % (ref 14.0–49.7)
MCH: 29.3 pg (ref 25.1–34.0)
MCHC: 32.5 g/dL (ref 31.5–36.0)
MCV: 90.1 fL (ref 79.5–101.0)
MONO#: 0.2 10*3/uL (ref 0.1–0.9)
MONO%: 5.8 % (ref 0.0–14.0)
NEUT#: 1.6 10*3/uL (ref 1.5–6.5)
NEUT%: 47 % (ref 38.4–76.8)
Platelets: 267 10*3/uL (ref 145–400)
RBC: 3.91 10*6/uL (ref 3.70–5.45)
RDW: 16.9 % — ABNORMAL HIGH (ref 11.2–14.5)
WBC: 3.4 10*3/uL — AB (ref 3.9–10.3)
lymph#: 1.5 10*3/uL (ref 0.9–3.3)

## 2014-07-11 LAB — COMPREHENSIVE METABOLIC PANEL (CC13)
ALT: 143 U/L — AB (ref 0–55)
ANION GAP: 9 meq/L (ref 3–11)
AST: 91 U/L — ABNORMAL HIGH (ref 5–34)
Albumin: 3.7 g/dL (ref 3.5–5.0)
Alkaline Phosphatase: 270 U/L — ABNORMAL HIGH (ref 40–150)
BILIRUBIN TOTAL: 0.73 mg/dL (ref 0.20–1.20)
BUN: 6.5 mg/dL — ABNORMAL LOW (ref 7.0–26.0)
CALCIUM: 10.2 mg/dL (ref 8.4–10.4)
CHLORIDE: 106 meq/L (ref 98–109)
CO2: 24 mEq/L (ref 22–29)
CREATININE: 0.7 mg/dL (ref 0.6–1.1)
Glucose: 133 mg/dl (ref 70–140)
Potassium: 2.9 mEq/L — CL (ref 3.5–5.1)
Sodium: 139 mEq/L (ref 136–145)
Total Protein: 7.3 g/dL (ref 6.4–8.3)

## 2014-07-11 MED ORDER — INFLUENZA VAC SPLIT QUAD 0.5 ML IM SUSY: 0.5000 mL | PREFILLED_SYRINGE | Freq: Once | INTRAMUSCULAR | Status: DC

## 2014-07-11 MED ORDER — DEXAMETHASONE SODIUM PHOSPHATE 10 MG/ML IJ SOLN
10.0000 mg | Freq: Once | INTRAMUSCULAR | Status: AC
  Administered 2014-07-11: 10 mg via INTRAVENOUS

## 2014-07-11 MED ORDER — SODIUM CHLORIDE 0.9 % IJ SOLN
10.0000 mL | INTRAMUSCULAR | Status: DC | PRN
Start: 2014-07-11 — End: 2014-07-11
  Administered 2014-07-11: 10 mL
  Filled 2014-07-11: qty 10

## 2014-07-11 MED ORDER — POTASSIUM CHLORIDE 10 MEQ/100ML IV SOLN: 10.0000 meq | INTRAVENOUS | Status: DC

## 2014-07-11 MED ORDER — HEPARIN SOD (PORK) LOCK FLUSH 100 UNIT/ML IV SOLN
500.0000 [IU] | Freq: Once | INTRAVENOUS | Status: AC | PRN
  Administered 2014-07-11: 500 [IU]
  Filled 2014-07-11: qty 5

## 2014-07-11 MED ORDER — ONDANSETRON 8 MG/50ML IVPB (CHCC)
8.0000 mg | Freq: Once | INTRAVENOUS | Status: AC
  Administered 2014-07-11: 8 mg via INTRAVENOUS

## 2014-07-11 MED ORDER — SODIUM CHLORIDE 0.9 % IJ SOLN
10.0000 mL | INTRAMUSCULAR | Status: DC | PRN
  Administered 2014-07-11: 10 mL via INTRAVENOUS
  Filled 2014-07-11: qty 10

## 2014-07-11 MED ORDER — DEXAMETHASONE SODIUM PHOSPHATE 10 MG/ML IJ SOLN
INTRAMUSCULAR | Status: AC
  Filled 2014-07-11: qty 1

## 2014-07-11 MED ORDER — SODIUM CHLORIDE 0.9 % IV SOLN
1.4000 mg/m2 | Freq: Once | INTRAVENOUS | Status: AC
  Administered 2014-07-11: 2.5 mg via INTRAVENOUS
  Filled 2014-07-11: qty 5

## 2014-07-11 MED ORDER — SODIUM CHLORIDE 0.9 % IV SOLN
Freq: Once | INTRAVENOUS | Status: AC
  Administered 2014-07-11: 14:00:00 via INTRAVENOUS

## 2014-07-11 MED ORDER — SODIUM CHLORIDE 0.9 % IV SOLN
INTRAVENOUS | Status: DC
  Administered 2014-07-11: 15:00:00 via INTRAVENOUS
  Filled 2014-07-11: qty 250

## 2014-07-11 NOTE — Patient Instructions (Addendum)
[facility] Discharge Instructions for Patients Receiving Chemotherapy  Today you received the following chemotherapy agents: Halaven.  To help prevent nausea and vomiting after your treatment, we encourage you to take your nausea medication as prescribed.   If you develop nausea and vomiting that is not controlled by your nausea medication, call the clinic.   BELOW ARE SYMPTOMS THAT SHOULD BE REPORTED IMMEDIATELY:  *FEVER GREATER THAN 100.5 F  *CHILLS WITH OR WITHOUT FEVER  NAUSEA AND VOMITING THAT IS NOT CONTROLLED WITH YOUR NAUSEA MEDICATION  *UNUSUAL SHORTNESS OF BREATH  *UNUSUAL BRUISING OR BLEEDING  TENDERNESS IN MOUTH AND THROAT WITH OR WITHOUT PRESENCE OF ULCERS  *URINARY PROBLEMS  *BOWEL PROBLEMS  UNUSUAL RASH Items with * indicate a potential emergency and should be followed up as soon as possible.  Feel free to call the clinic you have any questions or concerns. The clinic phone number is [phone].     Eribulin solution for injection What is this medicine? ERIBULIN is a chemotherapy drug. It is used to treat breast cancer. This medicine may be used for other purposes; ask your health care provider or pharmacist if you have questions. COMMON BRAND NAME(S): Halaven What should I tell my health care provider before I take this medicine? They need to know if you have any of these conditions: -heart disease -kidney disease -liver disease -low blood counts, like low white cell, platelet, or red cell counts -an unusual or allergic reaction to eribulin, other medicines, foods, dyes, or preservatives -pregnant or trying to get pregnant -breast-feeding How should I use this medicine? This medicine is for infusion into a vein. It is given by a health care professional in a hospital or clinic setting. Talk to your pediatrician regarding the use of this medicine in children. Special care may be needed. Overdosage: If you think you've taken too  much of this medicine contact a poison control center or emergency room at once. Overdosage: If you think you have taken too much of this medicine contact a poison control center or emergency room at once. NOTE: This medicine is only for you. Do not share this medicine with others. What if I miss a dose? It is important not to miss your dose. Call your doctor or health care professional if you are unable to keep an appointment. What may interact with this medicine? Do not take this medicine with any of the following medications: -amiodarone -astemizole -arsenic trioxide -bepridil -bretylium -chloroquine -chlorpromazine -cisapride -clarithromycin -dextromethorphan, quinidine -disopyramide -dofetilide -droperidol -dronedarone -erythromycin -grepafloxacin -halofantrine -haloperidol -ibutilide -levomethadyl -mesoridazine -methadone -pentamidine -procainamide -quinidine -pimozide -posaconazole -probucol -propafenone -saquinavir -sotalol -sparfloxacin -terfenadine -thioridazine -troleandomycin -ziprasidone This list may not describe all possible interactions. Give your health care provider a list of all the medicines, herbs, non-prescription drugs, or dietary supplements you use. Also tell them if you smoke, drink alcohol, or use illegal drugs. Some items may interact with your medicine. What should I watch for while using this medicine? Your condition will be monitored carefully while you are receiving this medicine. This drug may make you feel generally unwell. This is not uncommon, as chemotherapy can affect healthy cells as well as cancer cells. Report any side effects. Continue your course of treatment even though you feel ill unless your doctor tells you to stop. Call your doctor or health care professional for advice if you get a fever, chills or sore throat, or other symptoms of a cold or flu. Do not treat yourself. This drug decreases your  body's ability to fight  infections. Try to avoid being around people who are sick. This medicine may increase your risk to bruise or bleed. Call your doctor or health care professional if you notice any unusual bleeding. Be careful brushing and flossing your teeth or using a toothpick because you may get an infection or bleed more easily. If you have any dental work done, tell your dentist you are receiving this medicine. Avoid taking products that contain aspirin, acetaminophen, ibuprofen, naproxen, or ketoprofen unless instructed by your doctor. These medicines may hide a fever. Do not become pregnant while taking this medicine. Women should inform their doctor if they wish to become pregnant or think they might be pregnant. There is a potential for serious side effects to an unborn child. Talk to your health care professional or pharmacist for more information. Do not breast-feed an infant while taking this medicine. What side effects may I notice from receiving this medicine? Side effects that you should report to your doctor or health care professional as soon as possible: -allergic reactions like skin rash, itching or hives, swelling of the face, lips, or tongue -low blood counts - this medicine may decrease the number of white blood cells, red blood cells and platelets. You may be at increased risk for infections and bleeding. -signs of infection - fever or chills, cough, sore throat, pain or difficulty passing urine -signs of decreased platelets or bleeding - bruising, pinpoint red spots on the skin, black, tarry stools, blood in the urine -signs of decreased red blood cells - unusually weak or tired, fainting spells, lightheadedness -pain, tingling, numbness in the hands or feet Side effects that usually do not require medical attention (Report these to your doctor or health care professional if they continue or are bothersome.): -constipation -hair loss -headache -loss of appetite -muscle or joint pain -nausea,  vomiting -stomach pain This list may not describe all possible side effects. Call your doctor for medical advice about side effects. You may report side effects to FDA at 1-800-FDA-1088. Where should I keep my medicine? This drug is given in a hospital or clinic and will not be stored at home. NOTE: This sheet is a summary. It may not cover all possible information. If you have questions about this medicine, talk to your doctor, pharmacist, or health care provider.  2015, [employer]. (2009-10-11 23:04:37)

## 2014-07-11 NOTE — Patient Instructions (Signed)

## 2014-07-11 NOTE — Telephone Encounter (Signed)
gv pt appt schedule for sept/oct °

## 2014-07-11 NOTE — Progress Notes (Signed)
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 dose in May 2014; changed to every 6 weeks beginning 02/28/2014 2 better coordinate with her Abraxane treatments  (10) skin involvement over the left breast and possibly other distant skin sites noted June 2014, with   (a) biopsy of the left breast and a left axillary node 04/29/2013  confirming 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  (11) status post bilateral mastectomies 05/25/2013, showing:  (a) on the left, mypT1c NX invasive mammary carcinoma, with ductal and lobular features, grade 1, repeat HER-2 again negative  (b) on the right, yp T2 NX invasive lobular breast cancer, grade 1, with negative margins, and HER-2 again negative  (12) right chest wall skin biopsy and right neck biopsy both positive for metastatic breast cancer  (13) fulvestrant at 500 mg monthly started  06/10/2013, last dose 01/17/2014, with progression  (14)  Hot flashes, improving on  clonidine at bedtime  (15)  Depression, started oral antidepressants, not taking.  (16)  Skin lesion, right arm  (17)  thyroid nodule - biopsy 02/28/2014 benign   (18) Patient have progressed on Carbo/Abraxane and now started Eribulin Mesylate D8C1 today. She will get Neulasta tomorrow. Her second cycle will start on 07/25/2014 and that's the day she will also get her flu shot.  (19) Patient ordered a Neulasta shot tomorrow.  (19)  Dysphagia - swallowing study shows esophageal narrowing, no mass; S/P esophageal dilatation with improvement.   (20) Anemia - Due to chemotherapy. Asymptomatic.   (21)  GERD - minimal improvement on omeprazole, being increased to Protonix  (22) I gave her a paxil script few weeks ago but patient admitted she has not started it yet.  (23) We gave a script for Cozaar 100 mg po daily (30)  (24)Last  Zometa would be August 25th 2015. q 6 weeks.  (25) for her hypokalemia, she was given 20 meq KCL iv and she will be taking 60 meq of kcl daily.check labs again on 07/25/2014. Flu shot, chemotherapy and md visit on 07/25/2014.  (26) for her knee pain she has medications. Try ted stockings and leg elevation for swelling in legs.  Saron has a good understanding of the overall plan. She agrees with it. She knows the goal of treatment in her case is control. At some point we will switch her to exemestane/everolimus or exemestane/Ibrance. First we will anticipate a good response from [place] chemotherapy.   Bernadene Bell, MD Medical Hematologist/Oncologist [place] [facility] Pager: [phone] Office No: [phone] 05/16/2014

## 2014-07-11 NOTE — Patient Instructions (Signed)
DASH Eating Plan DASH stands for "Dietary Approaches to Stop Hypertension." The DASH eating plan is a healthy eating plan that has been shown to reduce high blood pressure (hypertension). Additional health benefits may include reducing the risk of type 2 diabetes mellitus, heart disease, and stroke. The DASH eating plan may also help with weight loss. WHAT DO I NEED TO KNOW ABOUT THE DASH EATING PLAN? For the DASH eating plan, you will follow these general guidelines:  Choose foods with a percent daily value for sodium of less than 5% (as listed on the food label).  Use salt-free seasonings or herbs instead of table salt or sea salt.  Check with your health care provider or pharmacist before using salt substitutes.  Eat lower-sodium products, often labeled as "lower sodium" or "no salt added."  Eat fresh foods.  Eat more vegetables, fruits, and low-fat dairy products.  Choose whole grains. Look for the word "whole" as the first word in the ingredient list.  Choose fish and skinless chicken or turkey more often than red meat. Limit fish, poultry, and meat to 6 oz (170 g) each day.  Limit sweets, desserts, sugars, and sugary drinks.  Choose heart-healthy fats.  Limit cheese to 1 oz (28 g) per day.  Eat more home-cooked food and less restaurant, buffet, and fast food.  Limit fried foods.  Cook foods using methods other than frying.  Limit canned vegetables. If you do use them, rinse them well to decrease the sodium.  When eating at a restaurant, ask that your food be prepared with less salt, or no salt if possible. WHAT FOODS CAN I EAT? Seek help from a dietitian for individual calorie needs. Grains Whole grain or whole wheat bread. Brown rice. Whole grain or whole wheat pasta. Quinoa, bulgur, and whole grain cereals. Low-sodium cereals. Corn or whole wheat flour tortillas. Whole grain cornbread. Whole grain crackers. Low-sodium crackers. Vegetables Fresh or frozen vegetables  (raw, steamed, roasted, or grilled). Low-sodium or reduced-sodium tomato and vegetable juices. Low-sodium or reduced-sodium tomato sauce and paste. Low-sodium or reduced-sodium canned vegetables.  Fruits All fresh, canned (in natural juice), or frozen fruits. Meat and Other Protein Products Ground beef (85% or leaner), grass-fed beef, or beef trimmed of fat. Skinless chicken or turkey. Ground chicken or turkey. Pork trimmed of fat. All fish and seafood. Eggs. Dried beans, peas, or lentils. Unsalted nuts and seeds. Unsalted canned beans. Dairy Low-fat dairy products, such as skim or 1% milk, 2% or reduced-fat cheeses, low-fat ricotta or cottage cheese, or plain low-fat yogurt. Low-sodium or reduced-sodium cheeses. Fats and Oils Tub margarines without trans fats. Light or reduced-fat mayonnaise and salad dressings (reduced sodium). Avocado. Safflower, olive, or canola oils. Natural peanut or almond butter. Other Unsalted popcorn and pretzels. The items listed above may not be a complete list of recommended foods or beverages. Contact your dietitian for more options. WHAT FOODS ARE NOT RECOMMENDED? Grains White bread. White pasta. White rice. Refined cornbread. Bagels and croissants. Crackers that contain trans fat. Vegetables Creamed or fried vegetables. Vegetables in a cheese sauce. Regular canned vegetables. Regular canned tomato sauce and paste. Regular tomato and vegetable juices. Fruits Dried fruits. Canned fruit in light or heavy syrup. Fruit juice. Meat and Other Protein Products Fatty cuts of meat. Ribs, chicken wings, bacon, sausage, bologna, salami, chitterlings, fatback, hot dogs, bratwurst, and packaged luncheon meats. Salted nuts and seeds. Canned beans with salt. Dairy Whole or 2% milk, cream, half-and-half, and cream cheese. Whole-fat or sweetened yogurt. Full-fat   cheeses or blue cheese. Nondairy creamers and whipped toppings. Processed cheese, cheese spreads, or cheese  curds. Condiments Onion and garlic salt, seasoned salt, table salt, and sea salt. Canned and packaged gravies. Worcestershire sauce. Tartar sauce. Barbecue sauce. Teriyaki sauce. Soy sauce, including reduced sodium. Steak sauce. Fish sauce. Oyster sauce. Cocktail sauce. Horseradish. Ketchup and mustard. Meat flavorings and tenderizers. Bouillon cubes. Hot sauce. Tabasco sauce. Marinades. Taco seasonings. Relishes. Fats and Oils Butter, stick margarine, lard, shortening, ghee, and bacon fat. Coconut, palm kernel, or palm oils. Regular salad dressings. Other Pickles and olives. Salted popcorn and pretzels. The items listed above may not be a complete list of foods and beverages to avoid. Contact your dietitian for more information. WHERE CAN I FIND MORE INFORMATION? National Heart, Lung, and Blood Institute: www.nhlbi.nih.gov/health/health-topics/topics/dash/ Document Released: 10/09/2011 Document Revised: 03/06/2014 Document Reviewed: 08/24/2013 ExitCare Patient Information 2015 [employer]. This information is not intended to replace advice given to you by your health care provider. Make sure you discuss any questions you have with your health care provider. Hypertension Hypertension, commonly called high blood pressure, is when the force of blood pumping through your arteries is too strong. Your arteries are the blood vessels that carry blood from your heart throughout your body. A blood pressure reading consists of a higher number over a lower number, such as 110/72. The higher number (systolic) is the pressure inside your arteries when your heart pumps. The lower number (diastolic) is the pressure inside your arteries when your heart relaxes. Ideally you want your blood pressure below 120/80. Hypertension forces your heart to work harder to pump blood. Your arteries may become narrow or stiff. Having hypertension puts you at risk for heart disease, stroke, and other problems.  RISK  FACTORS Some risk factors for high blood pressure are controllable. Others are not.  Risk factors you cannot control include:   Race. You may be at higher risk if you are African American.  Age. Risk increases with age.  Gender. Men are at higher risk than women before age 45 years. After age 65, women are at higher risk than men. Risk factors you can control include:  Not getting enough exercise or physical activity.  Being overweight.  Getting too much fat, sugar, calories, or salt in your diet.  Drinking too much alcohol. SIGNS AND SYMPTOMS Hypertension does not usually cause signs or symptoms. Extremely high blood pressure (hypertensive crisis) may cause headache, anxiety, shortness of breath, and nosebleed. DIAGNOSIS  To check if you have hypertension, your health care provider will measure your blood pressure while you are seated, with your arm held at the level of your heart. It should be measured at least twice using the same arm. Certain conditions can cause a difference in blood pressure between your right and left arms. A blood pressure reading that is higher than normal on one occasion does not mean that you need treatment. If one blood pressure reading is high, ask your health care provider about having it checked again. TREATMENT  Treating high blood pressure includes making lifestyle changes and possibly taking medicine. Living a healthy lifestyle can help lower high blood pressure. You may need to change some of your habits. Lifestyle changes may include:  Following the DASH diet. This diet is high in fruits, vegetables, and whole grains. It is low in salt, red meat, and added sugars.  Getting at least 2 hours of brisk physical activity every week.  Losing weight if necessary.  Not smoking.  Limiting   alcoholic beverages.  Learning ways to reduce stress. If lifestyle changes are not enough to get your blood pressure under control, your health care provider may  prescribe medicine. You may need to take more than one. Work closely with your health care provider to understand the risks and benefits. HOME CARE INSTRUCTIONS  Have your blood pressure rechecked as directed by your health care provider.   Take medicines only as directed by your health care provider. Follow the directions carefully. Blood pressure medicines must be taken as prescribed. The medicine does not work as well when you skip doses. Skipping doses also puts you at risk for problems.   Do not smoke.   Monitor your blood pressure at home as directed by your health care provider. SEEK MEDICAL CARE IF:   You think you are having a reaction to medicines taken.  You have recurrent headaches or feel dizzy.  You have swelling in your ankles.  You have trouble with your vision. SEEK IMMEDIATE MEDICAL CARE IF:  You develop a severe headache or confusion.  You have unusual weakness, numbness, or feel faint.  You have severe chest or abdominal pain.  You vomit repeatedly.  You have trouble breathing. MAKE SURE YOU:   Understand these instructions.  Will watch your condition.  Will get help right away if you are not doing well or get worse. Document Released: 10/20/2005 Document Revised: 03/06/2014 Document Reviewed: 08/12/2013 ExitCare Patient Information 2015 ExitCare, LLC. This information is not intended to replace advice given to you by your health care provider. Make sure you discuss any questions you have with your health care provider.  

## 2014-07-11 NOTE — Progress Notes (Signed)
Patient ID: Tammy [facility], female   DOB: [birth year], 48 y.o.   MRN: [id number]   Tammy [facility], is a 48 y.o. female  [id number]  [id number]:[id number]  DOB - 02/13/1966  No chief complaint on file.       Subjective:   Tammy [facility] is a 48 y.o. female here today for a follow up visit. Patient with history of hypertension, GERD, hyperlipidemia, metastatic breast cancer on chemotherapy here today for Pap smear. She has appointment with oncology this afternoon. Patient has no complaint. Patient has No headache, No chest pain, - No Nausea, No new weakness tingling or numbness, No Cough - SOB.  Problem  Essential Hypertension  Pap Smear for Cervical Cancer Screening    ALLERGIES: Allergies  Allergen Reactions  . Metronidazole Swelling  . Tramadol Nausea Only    PAST MEDICAL HISTORY: Past Medical History  Diagnosis Date  . Hypertension   . Allergy   . GERD (gastroesophageal reflux disease)   . Thyroid disease   . Anemia     resolved 2011  . Hyperlipidemia     controlled  . History of blood transfusion 2009    [facility] -  UNKNOWN NUMBER OF UNITS TRANSFUSED  . Breast cancer 10/2009, 2014    ER+/PR+/Her2-    MEDICATIONS AT HOME: Prior to Admission medications   Medication Sig Start Date End Date Taking? Authorizing Provider  amLODipine (NORVASC) 10 MG tablet Take 1 tablet (10 mg total) by mouth every morning. 04/10/14   Tresa Garter, MD  calcium carbonate (TUMS - DOSED IN MG ELEMENTAL CALCIUM) 500 MG chewable tablet Chew 1 tablet by mouth daily as needed for indigestion.  03/29/13   Chauncey Cruel, MD  cloNIDine (CATAPRES) 0.2 MG tablet Take 1 tablet (0.2 mg total) by mouth at bedtime. 04/10/14   Tresa Garter, MD  dexamethasone (DECADRON) 4 MG tablet Take 2 tablets (8 mg total) by mouth 2 (two) times daily with a meal. Start the day after chemotherapy for 2 days. Take with food. 07/02/14   Chauncey Cruel, MD  HYDROcodone-acetaminophen (NORCO) 7.5-325 MG per tablet Take  1-2 tablets by mouth every 8 (eight) hours as needed for moderate pain. 05/16/14   Chauncey Cruel, MD  ibuprofen (ADVIL,MOTRIN) 200 MG tablet Take 400 mg by mouth every 8 (eight) hours as needed.    Historical Provider, MD  Lactulose SOLN Take 20 mLs by mouth at bedtime as needed. 02/28/14   Wilmon Arms, MD  lidocaine-prilocaine (EMLA) cream Apply 1 application topically as needed. Apply over portacath  1 1/2 hours to 2 hours prior to procedures as needed. 02/22/14   Amy Milda Smart, PA-C  LORazepam (ATIVAN) 0.5 MG tablet Take 1 tablet (0.5 mg total) by mouth at bedtime as needed (Nausea or vomiting). 07/03/14   Chauncey Cruel, MD  losartan (COZAAR) 100 MG tablet Take 1 tablet (100 mg total) by mouth daily. 06/13/14   Aasim Marla Roe, MD  ondansetron (ZOFRAN) 8 MG tablet Take 1 tablet (8 mg total) by mouth 2 (two) times daily. Start the day after chemo for 2 days. Then take as needed for nausea or vomiting. 02/08/14   Chauncey Cruel, MD  ondansetron (ZOFRAN) 8 MG tablet Take 1 tablet (8 mg total) by mouth 2 (two) times daily. Start the day after chemo for 2 days. Then take as needed for nausea or vomiting. 07/02/14   Chauncey Cruel, MD  pantoprazole (PROTONIX) 40 MG tablet Take 1 tablet (40 mg total)  by mouth daily. 04/11/14   Amy Milda Smart, PA-C  PARoxetine (PAXIL) 10 MG tablet Take 1 tablet (10 mg total) by mouth daily. 05/16/14   Aasim Marla Roe, MD  potassium chloride (MICRO-K) 10 MEQ CR capsule Take 2 capsules (20 mEq total) by mouth 2 (two) times daily. 04/03/14   Chauncey Cruel, MD  prochlorperazine (COMPAZINE) 10 MG tablet Take 10 mg by mouth every 6 (six) hours as needed for nausea or vomiting.    Historical Provider, MD  prochlorperazine (COMPAZINE) 10 MG tablet Take 1 tablet (10 mg total) by mouth every 6 (six) hours as needed (Nausea or vomiting). 07/02/14   Chauncey Cruel, MD  Zoledronic Acid (ZOMETA IV) Inject 4 mg into the vein every 6 (six) weeks.  03/17/13   Chauncey Cruel,  MD     Objective:   There were no vitals filed for this visit.  Exam General appearance : Awake, alert, not in any distress. Speech Clear. Not toxic looking HEENT: Atraumatic and Normocephalic, pupils equally reactive to light and accomodation Neck: supple, no JVD. No cervical lymphadenopathy.  Chest:Good air entry bilaterally, no added sounds  CVS: S1 S2 regular, no murmurs.  Abdomen: Bowel sounds present, Non tender and not distended with no gaurding, rigidity or rebound. Extremities: B/L Lower Ext shows no edema, both legs are warm to touch Neurology: Awake alert, and oriented X 3, CN II-XII intact, Non focal Pelvic Exam: Cervix normal in appearance, external genitalia normal, no adnexal masses or tenderness, no cervical motion tenderness, rectovaginal septum normal, uterus normal size, shape, and consistency and vagina normal without discharge    Data Review No results found for this basename: HGBA1C     Assessment & Plan   1. Essential hypertension Continue amlodipine, clonidine and losartan. - DASH diet We discussed blood pressure goal, patient is to report to the clinic with blood pressure consistently above 140/90 mmHg.  2. Pap smear for cervical cancer screening  - Cytology - PAP - Cervicovaginal ancillary only  Patient was counseled extensively about nutrition and exercise. Return in about 3 months (around 10/10/2014), or if symptoms worsen or fail to improve, for Follow up HTN.  The patient was given clear instructions to go to ER or return to medical center if symptoms don't improve, worsen or new problems develop. The patient verbalized understanding. The patient was told to call to get lab results if they haven't heard anything in the next week.   This note has been created with Surveyor, quantity. Any transcriptional errors are unintentional.    Angelica Chessman, MD, [place], [place], [place]  and [facility] [place], [place]   07/11/2014, 11:13 AM

## 2014-07-12 ENCOUNTER — Ambulatory Visit (HOSPITAL_BASED_OUTPATIENT_CLINIC_OR_DEPARTMENT_OTHER): Payer: Medicaid Other

## 2014-07-12 VITALS — BP 123/76 | HR 111 | Temp 98.2°F

## 2014-07-12 DIAGNOSIS — C7951 Secondary malignant neoplasm of bone: Secondary | ICD-10-CM

## 2014-07-12 DIAGNOSIS — C792 Secondary malignant neoplasm of skin: Secondary | ICD-10-CM

## 2014-07-12 DIAGNOSIS — C786 Secondary malignant neoplasm of retroperitoneum and peritoneum: Secondary | ICD-10-CM

## 2014-07-12 DIAGNOSIS — C779 Secondary and unspecified malignant neoplasm of lymph node, unspecified: Secondary | ICD-10-CM

## 2014-07-12 DIAGNOSIS — C50919 Malignant neoplasm of unspecified site of unspecified female breast: Secondary | ICD-10-CM

## 2014-07-12 DIAGNOSIS — C7952 Secondary malignant neoplasm of bone marrow: Secondary | ICD-10-CM

## 2014-07-12 DIAGNOSIS — C7960 Secondary malignant neoplasm of unspecified ovary: Secondary | ICD-10-CM

## 2014-07-12 DIAGNOSIS — Z5189 Encounter for other specified aftercare: Secondary | ICD-10-CM

## 2014-07-12 DIAGNOSIS — C7982 Secondary malignant neoplasm of genital organs: Secondary | ICD-10-CM

## 2014-07-12 DIAGNOSIS — C50419 Malignant neoplasm of upper-outer quadrant of unspecified female breast: Secondary | ICD-10-CM

## 2014-07-12 LAB — CYTOLOGY - PAP

## 2014-07-12 MED ORDER — PEGFILGRASTIM INJECTION 6 MG/0.6ML
6.0000 mg | Freq: Once | SUBCUTANEOUS | Status: AC
  Administered 2014-07-12: 6 mg via SUBCUTANEOUS
  Filled 2014-07-12: qty 0.6

## 2014-07-12 NOTE — Patient Instructions (Signed)
Pegfilgrastim injection What is this medicine? PEGFILGRASTIM (peg fil GRA stim) is a long-acting granulocyte colony-stimulating factor that stimulates the growth of neutrophils, a type of white blood cell important in the body's fight against infection. It is used to reduce the incidence of fever and infection in patients with certain types of cancer who are receiving chemotherapy that affects the bone marrow. This medicine may be used for other purposes; ask your health care provider or pharmacist if you have questions. COMMON BRAND NAME(S): Neulasta What should I tell my health care provider before I take this medicine? They need to know if you have any of these conditions: -latex allergy -ongoing radiation therapy -sickle cell disease -skin reactions to acrylic adhesives (On-Body Injector only) -an unusual or allergic reaction to pegfilgrastim, filgrastim, other medicines, foods, dyes, or preservatives -pregnant or trying to get pregnant -breast-feeding How should I use this medicine? This medicine is for injection under the skin. If you get this medicine at home, you will be taught how to prepare and give the pre-filled syringe or how to use the On-body Injector. Refer to the patient Instructions for Use for detailed instructions. Use exactly as directed. Take your medicine at regular intervals. Do not take your medicine more often than directed. It is important that you put your used needles and syringes in a special sharps container. Do not put them in a trash can. If you do not have a sharps container, call your pharmacist or healthcare provider to get one. Talk to your pediatrician regarding the use of this medicine in children. Special care may be needed. Overdosage: If you think you have taken too much of this medicine contact a poison control center or emergency room at once. NOTE: This medicine is only for you. Do not share this medicine with others. What if I miss a dose? It is  important not to miss your dose. Call your doctor or health care professional if you miss your dose. If you miss a dose due to an On-body Injector failure or leakage, a new dose should be administered as soon as possible using a single prefilled syringe for manual use. What may interact with this medicine? Interactions have not been studied. Give your health care provider a list of all the medicines, herbs, non-prescription drugs, or dietary supplements you use. Also tell them if you smoke, drink alcohol, or use illegal drugs. Some items may interact with your medicine. This list may not describe all possible interactions. Give your health care provider a list of all the medicines, herbs, non-prescription drugs, or dietary supplements you use. Also tell them if you smoke, drink alcohol, or use illegal drugs. Some items may interact with your medicine. What should I watch for while using this medicine? You may need blood work done while you are taking this medicine. If you are going to need a MRI, CT scan, or other procedure, tell your doctor that you are using this medicine (On-Body Injector only). What side effects may I notice from receiving this medicine? Side effects that you should report to your doctor or health care professional as soon as possible: -allergic reactions like skin rash, itching or hives, swelling of the face, lips, or tongue -dizziness -fever -pain, redness, or irritation at site where injected -pinpoint red spots on the skin -shortness of breath or breathing problems -stomach or side pain, or pain at the shoulder -swelling -tiredness -trouble passing urine Side effects that usually do not require medical attention (report to your doctor   or health care professional if they continue or are bothersome): -bone pain -muscle pain This list may not describe all possible side effects. Call your doctor for medical advice about side effects. You may report side effects to FDA at  1-800-FDA-1088. Where should I keep my medicine? Keep out of the reach of children. Store pre-filled syringes in a refrigerator between 2 and 8 degrees C (36 and 46 degrees F). Do not freeze. Keep in carton to protect from light. Throw away this medicine if it is left out of the refrigerator for more than 48 hours. Throw away any unused medicine after the expiration date. NOTE: This sheet is a summary. It may not cover all possible information. If you have questions about this medicine, talk to your doctor, pharmacist, or health care provider.  2015, Elsevier/[employer] Standard. (2014-01-19 16:14:05)  

## 2014-07-17 ENCOUNTER — Encounter: Payer: Self-pay | Admitting: *Deleted

## 2014-07-17 NOTE — Progress Notes (Signed)
Received triage call report from [facility] dated 07/15/14. Sent to scan.

## 2014-07-25 ENCOUNTER — Telehealth: Payer: Self-pay | Admitting: Nurse Practitioner

## 2014-07-25 ENCOUNTER — Ambulatory Visit (HOSPITAL_BASED_OUTPATIENT_CLINIC_OR_DEPARTMENT_OTHER): Payer: Medicaid Other | Admitting: Nurse Practitioner

## 2014-07-25 ENCOUNTER — Ambulatory Visit (HOSPITAL_BASED_OUTPATIENT_CLINIC_OR_DEPARTMENT_OTHER): Payer: Medicaid Other

## 2014-07-25 ENCOUNTER — Other Ambulatory Visit (HOSPITAL_BASED_OUTPATIENT_CLINIC_OR_DEPARTMENT_OTHER): Payer: Medicaid Other

## 2014-07-25 ENCOUNTER — Other Ambulatory Visit: Payer: Medicaid Other

## 2014-07-25 ENCOUNTER — Encounter: Payer: Self-pay | Admitting: Nurse Practitioner

## 2014-07-25 ENCOUNTER — Ambulatory Visit: Payer: Medicaid Other

## 2014-07-25 VITALS — BP 121/87 | HR 96 | Temp 98.8°F | Resp 18 | Ht 63.0 in | Wt 155.2 lb

## 2014-07-25 DIAGNOSIS — C7951 Secondary malignant neoplasm of bone: Secondary | ICD-10-CM

## 2014-07-25 DIAGNOSIS — C792 Secondary malignant neoplasm of skin: Secondary | ICD-10-CM

## 2014-07-25 DIAGNOSIS — C50419 Malignant neoplasm of upper-outer quadrant of unspecified female breast: Secondary | ICD-10-CM

## 2014-07-25 DIAGNOSIS — G620 Drug-induced polyneuropathy: Secondary | ICD-10-CM | POA: Insufficient documentation

## 2014-07-25 DIAGNOSIS — C7952 Secondary malignant neoplasm of bone marrow: Secondary | ICD-10-CM

## 2014-07-25 DIAGNOSIS — L989 Disorder of the skin and subcutaneous tissue, unspecified: Secondary | ICD-10-CM

## 2014-07-25 DIAGNOSIS — C7982 Secondary malignant neoplasm of genital organs: Secondary | ICD-10-CM

## 2014-07-25 DIAGNOSIS — C50919 Malignant neoplasm of unspecified site of unspecified female breast: Secondary | ICD-10-CM

## 2014-07-25 DIAGNOSIS — C7960 Secondary malignant neoplasm of unspecified ovary: Secondary | ICD-10-CM

## 2014-07-25 DIAGNOSIS — G609 Hereditary and idiopathic neuropathy, unspecified: Secondary | ICD-10-CM

## 2014-07-25 DIAGNOSIS — R1310 Dysphagia, unspecified: Secondary | ICD-10-CM

## 2014-07-25 DIAGNOSIS — R61 Generalized hyperhidrosis: Secondary | ICD-10-CM

## 2014-07-25 DIAGNOSIS — Z95828 Presence of other vascular implants and grafts: Secondary | ICD-10-CM

## 2014-07-25 DIAGNOSIS — C787 Secondary malignant neoplasm of liver and intrahepatic bile duct: Secondary | ICD-10-CM

## 2014-07-25 DIAGNOSIS — C50411 Malignant neoplasm of upper-outer quadrant of right female breast: Secondary | ICD-10-CM

## 2014-07-25 DIAGNOSIS — E041 Nontoxic single thyroid nodule: Secondary | ICD-10-CM

## 2014-07-25 DIAGNOSIS — T451X5A Adverse effect of antineoplastic and immunosuppressive drugs, initial encounter: Secondary | ICD-10-CM

## 2014-07-25 DIAGNOSIS — F3289 Other specified depressive episodes: Secondary | ICD-10-CM

## 2014-07-25 DIAGNOSIS — F329 Major depressive disorder, single episode, unspecified: Secondary | ICD-10-CM

## 2014-07-25 LAB — CBC WITH DIFFERENTIAL/PLATELET
BASO%: 0.7 % (ref 0.0–2.0)
BASOS ABS: 0.1 10*3/uL (ref 0.0–0.1)
EOS ABS: 0 10*3/uL (ref 0.0–0.5)
EOS%: 0 % (ref 0.0–7.0)
HCT: 32.3 % — ABNORMAL LOW (ref 34.8–46.6)
HGB: 10.5 g/dL — ABNORMAL LOW (ref 11.6–15.9)
LYMPH%: 22.6 % (ref 14.0–49.7)
MCH: 28.9 pg (ref 25.1–34.0)
MCHC: 32.5 g/dL (ref 31.5–36.0)
MCV: 89 fL (ref 79.5–101.0)
MONO#: 1 10*3/uL — AB (ref 0.1–0.9)
MONO%: 12.9 % (ref 0.0–14.0)
NEUT%: 63.8 % (ref 38.4–76.8)
NEUTROS ABS: 4.8 10*3/uL (ref 1.5–6.5)
NRBC: 7 % — AB (ref 0–0)
PLATELETS: 155 10*3/uL (ref 145–400)
RBC: 3.63 10*6/uL — AB (ref 3.70–5.45)
RDW: 17.5 % — ABNORMAL HIGH (ref 11.2–14.5)
WBC: 7.5 10*3/uL (ref 3.9–10.3)
lymph#: 1.7 10*3/uL (ref 0.9–3.3)

## 2014-07-25 LAB — COMPREHENSIVE METABOLIC PANEL (CC13)
ALBUMIN: 3.3 g/dL — AB (ref 3.5–5.0)
ALT: 117 U/L — ABNORMAL HIGH (ref 0–55)
AST: 113 U/L — AB (ref 5–34)
Alkaline Phosphatase: 359 U/L — ABNORMAL HIGH (ref 40–150)
Anion Gap: 8 mEq/L (ref 3–11)
BUN: 8.7 mg/dL (ref 7.0–26.0)
CALCIUM: 9.9 mg/dL (ref 8.4–10.4)
CHLORIDE: 108 meq/L (ref 98–109)
CO2: 24 meq/L (ref 22–29)
Creatinine: 0.7 mg/dL (ref 0.6–1.1)
Glucose: 101 mg/dl (ref 70–140)
POTASSIUM: 3.9 meq/L (ref 3.5–5.1)
Sodium: 140 mEq/L (ref 136–145)
Total Bilirubin: 0.69 mg/dL (ref 0.20–1.20)
Total Protein: 7.1 g/dL (ref 6.4–8.3)

## 2014-07-25 LAB — MAGNESIUM (CC13): MAGNESIUM: 2.2 mg/dL (ref 1.5–2.5)

## 2014-07-25 MED ORDER — EVEROLIMUS 10 MG PO TABS: 10.0000 mg | ORAL_TABLET | Freq: Every day | ORAL | Status: DC

## 2014-07-25 MED ORDER — EXEMESTANE 25 MG PO TABS: 25.0000 mg | ORAL_TABLET | Freq: Every day | ORAL | Status: DC

## 2014-07-25 MED ORDER — SODIUM CHLORIDE 0.9 % IJ SOLN
10.0000 mL | INTRAMUSCULAR | Status: DC | PRN
  Administered 2014-07-25: 10 mL via INTRAVENOUS
  Filled 2014-07-25: qty 10
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
## 2014-07-25 NOTE — Patient Instructions (Signed)

## 2014-07-25 NOTE — Telephone Encounter (Signed)
per pof to CX all appts add 10/16 keep 11/2-gave pt updated copy of sch

## 2014-07-27 ENCOUNTER — Telehealth: Payer: Self-pay | Admitting: *Deleted

## 2014-07-27 NOTE — Telephone Encounter (Signed)
This RN received message from pt stating " my pills arrived so I will start both of them in the AM ( 9/25 ).  Return call number given [phone].  This note will be given to MD.

## 2014-07-28 ENCOUNTER — Telehealth: Payer: Self-pay | Admitting: Emergency Medicine

## 2014-07-28 ENCOUNTER — Ambulatory Visit (HOSPITAL_COMMUNITY): Admission: RE | Admit: 2014-07-28 | Payer: Medicaid Other | Source: Ambulatory Visit

## 2014-07-28 NOTE — Telephone Encounter (Signed)
Message copied by Ricci Barker on Fri Jul 28, 2014  4:48 PM ------      Message from: Angelica Chessman E      Created: Wed Jul 26, 2014 10:19 AM       Please inform patient that her Pap smear result is negative for malignancy, and negative for infection. ------

## 2014-07-28 NOTE — Telephone Encounter (Signed)
Left message for pt to call for test results 

## 2014-08-01 ENCOUNTER — Other Ambulatory Visit: Payer: Medicaid Other

## 2014-08-01 ENCOUNTER — Ambulatory Visit: Payer: Medicaid Other | Admitting: Nurse Practitioner

## 2014-08-01 ENCOUNTER — Ambulatory Visit: Payer: Medicaid Other

## 2014-08-02 ENCOUNTER — Telehealth: Payer: Self-pay | Admitting: Emergency Medicine

## 2014-08-02 NOTE — Telephone Encounter (Signed)
Pt given pap smear results

## 2014-08-03 ENCOUNTER — Other Ambulatory Visit: Payer: Self-pay | Admitting: Oncology

## 2014-08-03 NOTE — Progress Notes (Unsigned)
Since you started her everolimus 07/28/2014.

## 2014-08-05 ENCOUNTER — Ambulatory Visit (HOSPITAL_COMMUNITY): Admission: RE | Admit: 2014-08-05 | Payer: Medicaid Other | Source: Ambulatory Visit

## 2014-08-10 ENCOUNTER — Other Ambulatory Visit: Payer: Self-pay | Admitting: Oncology

## 2014-08-10 DIAGNOSIS — C50411 Malignant neoplasm of upper-outer quadrant of right female breast: Secondary | ICD-10-CM

## 2014-08-15 ENCOUNTER — Other Ambulatory Visit: Payer: Medicaid Other

## 2014-08-15 ENCOUNTER — Ambulatory Visit: Payer: Medicaid Other | Admitting: Nurse Practitioner

## 2014-08-18 ENCOUNTER — Other Ambulatory Visit (HOSPITAL_BASED_OUTPATIENT_CLINIC_OR_DEPARTMENT_OTHER): Payer: Medicaid Other

## 2014-08-18 ENCOUNTER — Ambulatory Visit (HOSPITAL_BASED_OUTPATIENT_CLINIC_OR_DEPARTMENT_OTHER): Payer: Medicaid Other | Admitting: Nurse Practitioner

## 2014-08-18 ENCOUNTER — Telehealth: Payer: Self-pay | Admitting: Nurse Practitioner

## 2014-08-18 ENCOUNTER — Encounter: Payer: Self-pay | Admitting: Nurse Practitioner

## 2014-08-18 ENCOUNTER — Telehealth: Payer: Self-pay | Admitting: *Deleted

## 2014-08-18 ENCOUNTER — Ambulatory Visit: Payer: Medicaid Other

## 2014-08-18 VITALS — BP 113/76 | HR 80 | Temp 98.4°F | Resp 18 | Ht 63.0 in | Wt 152.4 lb

## 2014-08-18 DIAGNOSIS — R682 Dry mouth, unspecified: Secondary | ICD-10-CM

## 2014-08-18 DIAGNOSIS — T451X5A Adverse effect of antineoplastic and immunosuppressive drugs, initial encounter: Secondary | ICD-10-CM

## 2014-08-18 DIAGNOSIS — C50919 Malignant neoplasm of unspecified site of unspecified female breast: Secondary | ICD-10-CM | POA: Insufficient documentation

## 2014-08-18 DIAGNOSIS — IMO0001 Reserved for inherently not codable concepts without codable children: Secondary | ICD-10-CM | POA: Insufficient documentation

## 2014-08-18 DIAGNOSIS — G620 Drug-induced polyneuropathy: Secondary | ICD-10-CM

## 2014-08-18 DIAGNOSIS — C7951 Secondary malignant neoplasm of bone: Secondary | ICD-10-CM

## 2014-08-18 DIAGNOSIS — C787 Secondary malignant neoplasm of liver and intrahepatic bile duct: Secondary | ICD-10-CM

## 2014-08-18 DIAGNOSIS — C50412 Malignant neoplasm of upper-outer quadrant of left female breast: Secondary | ICD-10-CM

## 2014-08-18 DIAGNOSIS — C7989 Secondary malignant neoplasm of other specified sites: Secondary | ICD-10-CM

## 2014-08-18 DIAGNOSIS — C7982 Secondary malignant neoplasm of genital organs: Secondary | ICD-10-CM

## 2014-08-18 DIAGNOSIS — C7960 Secondary malignant neoplasm of unspecified ovary: Secondary | ICD-10-CM

## 2014-08-18 DIAGNOSIS — C50911 Malignant neoplasm of unspecified site of right female breast: Secondary | ICD-10-CM

## 2014-08-18 DIAGNOSIS — Z95828 Presence of other vascular implants and grafts: Secondary | ICD-10-CM

## 2014-08-18 LAB — CBC WITH DIFFERENTIAL/PLATELET
BASO%: 1.5 % (ref 0.0–2.0)
BASOS ABS: 0 10*3/uL (ref 0.0–0.1)
EOS ABS: 0.1 10*3/uL (ref 0.0–0.5)
EOS%: 4.2 % (ref 0.0–7.0)
HCT: 32.3 % — ABNORMAL LOW (ref 34.8–46.6)
HEMOGLOBIN: 10.6 g/dL — AB (ref 11.6–15.9)
LYMPH%: 44.1 % (ref 14.0–49.7)
MCH: 28.3 pg (ref 25.1–34.0)
MCHC: 32.8 g/dL (ref 31.5–36.0)
MCV: 86.1 fL (ref 79.5–101.0)
MONO#: 0.4 10*3/uL (ref 0.1–0.9)
MONO%: 13.4 % (ref 0.0–14.0)
NEUT%: 36.8 % — ABNORMAL LOW (ref 38.4–76.8)
NEUTROS ABS: 1.1 10*3/uL — AB (ref 1.5–6.5)
Platelets: 211 10*3/uL (ref 145–400)
RBC: 3.75 10*6/uL (ref 3.70–5.45)
RDW: 16.6 % — AB (ref 11.2–14.5)
WBC: 3 10*3/uL — AB (ref 3.9–10.3)
lymph#: 1.3 10*3/uL (ref 0.9–3.3)

## 2014-08-18 LAB — COMPREHENSIVE METABOLIC PANEL (CC13)
ALBUMIN: 3.7 g/dL (ref 3.5–5.0)
ALT: 60 U/L — ABNORMAL HIGH (ref 0–55)
AST: 43 U/L — ABNORMAL HIGH (ref 5–34)
Alkaline Phosphatase: 233 U/L — ABNORMAL HIGH (ref 40–150)
Anion Gap: 8 mEq/L (ref 3–11)
BUN: 10.4 mg/dL (ref 7.0–26.0)
CO2: 22 mEq/L (ref 22–29)
Calcium: 9.7 mg/dL (ref 8.4–10.4)
Chloride: 110 mEq/L — ABNORMAL HIGH (ref 98–109)
Creatinine: 0.7 mg/dL (ref 0.6–1.1)
GLUCOSE: 102 mg/dL (ref 70–140)
Potassium: 3.5 mEq/L (ref 3.5–5.1)
Sodium: 140 mEq/L (ref 136–145)
Total Bilirubin: 0.42 mg/dL (ref 0.20–1.20)
Total Protein: 7 g/dL (ref 6.4–8.3)

## 2014-08-18 MED ORDER — SODIUM CHLORIDE 0.9 % IJ SOLN
10.0000 mL | INTRAMUSCULAR | Status: DC | PRN
  Administered 2014-08-18: 10 mL via INTRAVENOUS
  Filled 2014-08-18: qty 10

## 2014-08-18 MED ORDER — HEPARIN SOD (PORK) LOCK FLUSH 100 UNIT/ML IV SOLN
500.0000 [IU] | Freq: Once | INTRAVENOUS | Status: AC
  Administered 2014-08-18: 500 [IU] via INTRAVENOUS
  Filled 2014-08-18: qty 5

## 2014-08-18 MED ORDER — ACYCLOVIR 400 MG PO TABS: 400.0000 mg | ORAL_TABLET | Freq: Two times a day (BID) | ORAL | Status: DC

## 2014-08-18 MED ORDER — EVEROLIMUS 5 MG PO TABS: 5.0000 mg | ORAL_TABLET | Freq: Every day | ORAL | Status: DC

## 2014-08-18 NOTE — Patient Instructions (Signed)

## 2014-08-18 NOTE — Telephone Encounter (Signed)
, °

## 2014-08-18 NOTE — Telephone Encounter (Signed)
Per staff message and POF I have scheduled appts. Advised scheduler of appts. JMW  
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
## 2014-08-22 ENCOUNTER — Other Ambulatory Visit: Payer: Medicaid Other

## 2014-08-22 ENCOUNTER — Ambulatory Visit: Payer: Medicaid Other | Admitting: Nurse Practitioner

## 2014-08-23 ENCOUNTER — Ambulatory Visit (HOSPITAL_BASED_OUTPATIENT_CLINIC_OR_DEPARTMENT_OTHER): Payer: Medicaid Other

## 2014-08-23 VITALS — BP 121/93 | HR 107 | Temp 98.6°F

## 2014-08-23 DIAGNOSIS — C50412 Malignant neoplasm of upper-outer quadrant of left female breast: Secondary | ICD-10-CM

## 2014-08-23 DIAGNOSIS — C50919 Malignant neoplasm of unspecified site of unspecified female breast: Secondary | ICD-10-CM

## 2014-08-23 DIAGNOSIS — C7960 Secondary malignant neoplasm of unspecified ovary: Secondary | ICD-10-CM

## 2014-08-23 DIAGNOSIS — C7982 Secondary malignant neoplasm of genital organs: Secondary | ICD-10-CM

## 2014-08-23 DIAGNOSIS — C50911 Malignant neoplasm of unspecified site of right female breast: Secondary | ICD-10-CM

## 2014-08-23 DIAGNOSIS — C7951 Secondary malignant neoplasm of bone: Secondary | ICD-10-CM

## 2014-08-23 MED ORDER — ZOLEDRONIC ACID 4 MG/100ML IV SOLN
4.0000 mg | Freq: Once | INTRAVENOUS | Status: AC
  Administered 2014-08-23: 4 mg via INTRAVENOUS
  Filled 2014-08-23: qty 100

## 2014-08-23 MED ORDER — HEPARIN SOD (PORK) LOCK FLUSH 100 UNIT/ML IV SOLN
500.0000 [IU] | Freq: Once | INTRAVENOUS | Status: AC | PRN
  Administered 2014-08-23: 500 [IU]
  Filled 2014-08-23: qty 5

## 2014-08-23 MED ORDER — SODIUM CHLORIDE 0.9 % IJ SOLN
10.0000 mL | INTRAMUSCULAR | Status: DC | PRN
  Administered 2014-08-23: 10 mL
  Filled 2014-08-23: qty 10

## 2014-08-23 MED ORDER — SODIUM CHLORIDE 0.9 % IV SOLN
Freq: Once | INTRAVENOUS | Status: AC
  Administered 2014-08-23: 09:00:00 via INTRAVENOUS

## 2014-08-23 NOTE — Patient Instructions (Signed)

## 2014-08-31 ENCOUNTER — Ambulatory Visit (HOSPITAL_COMMUNITY): Payer: Medicaid Other

## 2014-09-04 ENCOUNTER — Ambulatory Visit (HOSPITAL_BASED_OUTPATIENT_CLINIC_OR_DEPARTMENT_OTHER): Payer: Medicaid Other | Admitting: Oncology

## 2014-09-04 ENCOUNTER — Telehealth: Payer: Self-pay | Admitting: Oncology

## 2014-09-04 ENCOUNTER — Encounter: Payer: Self-pay | Admitting: Nurse Practitioner

## 2014-09-04 VITALS — BP 127/83 | HR 91 | Temp 98.4°F | Resp 18 | Ht 63.0 in | Wt 155.0 lb

## 2014-09-04 DIAGNOSIS — C50411 Malignant neoplasm of upper-outer quadrant of right female breast: Secondary | ICD-10-CM

## 2014-09-04 DIAGNOSIS — C7960 Secondary malignant neoplasm of unspecified ovary: Secondary | ICD-10-CM

## 2014-09-04 DIAGNOSIS — C7951 Secondary malignant neoplasm of bone: Secondary | ICD-10-CM

## 2014-09-04 DIAGNOSIS — Z170 Estrogen receptor positive status [ER+]: Secondary | ICD-10-CM

## 2014-09-04 DIAGNOSIS — C7989 Secondary malignant neoplasm of other specified sites: Secondary | ICD-10-CM

## 2014-09-04 DIAGNOSIS — C50912 Malignant neoplasm of unspecified site of left female breast: Secondary | ICD-10-CM

## 2014-09-04 DIAGNOSIS — C787 Secondary malignant neoplasm of liver and intrahepatic bile duct: Secondary | ICD-10-CM

## 2014-09-04 DIAGNOSIS — C762 Malignant neoplasm of abdomen: Secondary | ICD-10-CM
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
## 2014-09-04 NOTE — Telephone Encounter (Signed)
per pof to sch pt appt-sent emailt o MW to move trmt to coordinate MD appt-will call pt once reply

## 2014-09-05 ENCOUNTER — Telehealth: Payer: Self-pay | Admitting: *Deleted

## 2014-09-05 NOTE — Telephone Encounter (Signed)
I have adjusted 1/22 appt 

## 2014-09-11 ENCOUNTER — Telehealth: Payer: Self-pay | Admitting: Nurse Practitioner

## 2014-09-11 NOTE — Telephone Encounter (Signed)
per pof to sch pt appt-cld & spoke to pt w/appt time & date-pt understood

## 2014-09-25 ENCOUNTER — Other Ambulatory Visit: Payer: Self-pay | Admitting: Nurse Practitioner

## 2014-09-25 ENCOUNTER — Other Ambulatory Visit: Payer: Self-pay | Admitting: Oncology

## 2014-09-29 ENCOUNTER — Other Ambulatory Visit: Payer: Self-pay | Admitting: *Deleted

## 2014-09-29 NOTE — Telephone Encounter (Signed)
THIS REFILL REQUEST FOR AFINITOR WAS GIVEN TO DR.MAGRINAT'S NURSE, VAL DODD,RN. 

## 2014-10-03 ENCOUNTER — Other Ambulatory Visit: Payer: Self-pay | Admitting: *Deleted

## 2014-10-03 DIAGNOSIS — C7951 Secondary malignant neoplasm of bone: Principal | ICD-10-CM

## 2014-10-03 DIAGNOSIS — C50919 Malignant neoplasm of unspecified site of unspecified female breast: Secondary | ICD-10-CM

## 2014-10-03 DIAGNOSIS — C7989 Secondary malignant neoplasm of other specified sites: Secondary | ICD-10-CM

## 2014-10-04 ENCOUNTER — Ambulatory Visit (HOSPITAL_BASED_OUTPATIENT_CLINIC_OR_DEPARTMENT_OTHER): Payer: Medicaid Other

## 2014-10-04 ENCOUNTER — Telehealth: Payer: Self-pay | Admitting: *Deleted

## 2014-10-04 ENCOUNTER — Ambulatory Visit (HOSPITAL_BASED_OUTPATIENT_CLINIC_OR_DEPARTMENT_OTHER): Payer: Medicaid Other | Admitting: Nurse Practitioner

## 2014-10-04 ENCOUNTER — Ambulatory Visit: Payer: Medicaid Other

## 2014-10-04 ENCOUNTER — Encounter: Payer: Self-pay | Admitting: Nurse Practitioner

## 2014-10-04 ENCOUNTER — Other Ambulatory Visit (HOSPITAL_BASED_OUTPATIENT_CLINIC_OR_DEPARTMENT_OTHER): Payer: Medicaid Other

## 2014-10-04 ENCOUNTER — Other Ambulatory Visit: Payer: Self-pay | Admitting: *Deleted

## 2014-10-04 ENCOUNTER — Telehealth: Payer: Self-pay | Admitting: Nurse Practitioner

## 2014-10-04 VITALS — BP 127/89 | HR 104 | Temp 98.3°F | Resp 18 | Ht 63.0 in | Wt 153.8 lb

## 2014-10-04 DIAGNOSIS — C787 Secondary malignant neoplasm of liver and intrahepatic bile duct: Secondary | ICD-10-CM

## 2014-10-04 DIAGNOSIS — C50911 Malignant neoplasm of unspecified site of right female breast: Secondary | ICD-10-CM

## 2014-10-04 DIAGNOSIS — C50919 Malignant neoplasm of unspecified site of unspecified female breast: Secondary | ICD-10-CM

## 2014-10-04 DIAGNOSIS — C50411 Malignant neoplasm of upper-outer quadrant of right female breast: Secondary | ICD-10-CM

## 2014-10-04 DIAGNOSIS — Z170 Estrogen receptor positive status [ER+]: Secondary | ICD-10-CM

## 2014-10-04 DIAGNOSIS — C7951 Secondary malignant neoplasm of bone: Principal | ICD-10-CM

## 2014-10-04 DIAGNOSIS — F329 Major depressive disorder, single episode, unspecified: Secondary | ICD-10-CM

## 2014-10-04 DIAGNOSIS — R1310 Dysphagia, unspecified: Secondary | ICD-10-CM

## 2014-10-04 DIAGNOSIS — C7989 Secondary malignant neoplasm of other specified sites: Secondary | ICD-10-CM

## 2014-10-04 DIAGNOSIS — C7962 Secondary malignant neoplasm of left ovary: Secondary | ICD-10-CM

## 2014-10-04 DIAGNOSIS — L989 Disorder of the skin and subcutaneous tissue, unspecified: Secondary | ICD-10-CM

## 2014-10-04 DIAGNOSIS — C7960 Secondary malignant neoplasm of unspecified ovary: Secondary | ICD-10-CM

## 2014-10-04 DIAGNOSIS — R748 Abnormal levels of other serum enzymes: Secondary | ICD-10-CM

## 2014-10-04 DIAGNOSIS — N951 Menopausal and female climacteric states: Secondary | ICD-10-CM

## 2014-10-04 DIAGNOSIS — Z95828 Presence of other vascular implants and grafts: Secondary | ICD-10-CM

## 2014-10-04 LAB — CBC WITH DIFFERENTIAL/PLATELET
BASO%: 0.8 % (ref 0.0–2.0)
BASOS ABS: 0 10*3/uL (ref 0.0–0.1)
EOS%: 2.9 % (ref 0.0–7.0)
Eosinophils Absolute: 0.1 10*3/uL (ref 0.0–0.5)
HCT: 35 % (ref 34.8–46.6)
HEMOGLOBIN: 11.2 g/dL — AB (ref 11.6–15.9)
LYMPH%: 38 % (ref 14.0–49.7)
MCH: 26.9 pg (ref 25.1–34.0)
MCHC: 32 g/dL (ref 31.5–36.0)
MCV: 84.1 fL (ref 79.5–101.0)
MONO#: 0.4 10*3/uL (ref 0.1–0.9)
MONO%: 10.9 % (ref 0.0–14.0)
NEUT#: 1.7 10*3/uL (ref 1.5–6.5)
NEUT%: 47.4 % (ref 38.4–76.8)
Platelets: 258 10*3/uL (ref 145–400)
RBC: 4.16 10*6/uL (ref 3.70–5.45)
RDW: 16.6 % — ABNORMAL HIGH (ref 11.2–14.5)
WBC: 3.6 10*3/uL — ABNORMAL LOW (ref 3.9–10.3)
lymph#: 1.3 10*3/uL (ref 0.9–3.3)

## 2014-10-04 LAB — COMPREHENSIVE METABOLIC PANEL (CC13)
ALBUMIN: 4 g/dL (ref 3.5–5.0)
ALK PHOS: 200 U/L — AB (ref 40–150)
ALT: 80 U/L — AB (ref 0–55)
AST: 55 U/L — ABNORMAL HIGH (ref 5–34)
Anion Gap: 9 mEq/L (ref 3–11)
BUN: 11.5 mg/dL (ref 7.0–26.0)
CALCIUM: 9.6 mg/dL (ref 8.4–10.4)
CHLORIDE: 110 meq/L — AB (ref 98–109)
CO2: 22 mEq/L (ref 22–29)
Creatinine: 0.6 mg/dL (ref 0.6–1.1)
Glucose: 97 mg/dl (ref 70–140)
Potassium: 3.5 mEq/L (ref 3.5–5.1)
SODIUM: 141 meq/L (ref 136–145)
TOTAL PROTEIN: 7.3 g/dL (ref 6.4–8.3)
Total Bilirubin: 0.55 mg/dL (ref 0.20–1.20)

## 2014-10-04 MED ORDER — EVEROLIMUS 5 MG PO TABS: 5.0000 mg | ORAL_TABLET | Freq: Every day | ORAL | Status: DC

## 2014-10-04 MED ORDER — ZOLEDRONIC ACID 4 MG/100ML IV SOLN
4.0000 mg | Freq: Once | INTRAVENOUS | Status: AC
  Administered 2014-10-04: 4 mg via INTRAVENOUS
  Filled 2014-10-04: qty 100

## 2014-10-04 MED ORDER — HEPARIN SOD (PORK) LOCK FLUSH 100 UNIT/ML IV SOLN
500.0000 [IU] | Freq: Once | INTRAVENOUS | Status: AC | PRN
  Administered 2014-10-04: 500 [IU]
  Filled 2014-10-04: qty 5

## 2014-10-04 MED ORDER — SODIUM CHLORIDE 0.9 % IJ SOLN
10.0000 mL | INTRAMUSCULAR | Status: DC | PRN
  Administered 2014-10-04: 10 mL via INTRAVENOUS
  Filled 2014-10-04: qty 10

## 2014-10-04 MED ORDER — SODIUM CHLORIDE 0.9 % IJ SOLN
10.0000 mL | INTRAMUSCULAR | Status: DC | PRN
  Administered 2014-10-04: 10 mL
  Filled 2014-10-04: qty 10

## 2014-10-04 NOTE — Telephone Encounter (Signed)
, °

## 2014-10-04 NOTE — Patient Instructions (Signed)

## 2014-10-04 NOTE — Addendum Note (Signed)
Addended by: Marcelino Duster on: 10/04/2014 10:00 AM   Modules accepted: Orders
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
## 2014-10-04 NOTE — Telephone Encounter (Signed)
Per staff message and POF I have scheduled appts. Advised scheduler of appts. JMW  

## 2014-10-04 NOTE — Patient Instructions (Signed)

## 2014-10-12 ENCOUNTER — Other Ambulatory Visit: Payer: Self-pay | Admitting: Oncology

## 2014-10-12 DIAGNOSIS — C50911 Malignant neoplasm of unspecified site of right female breast: Secondary | ICD-10-CM

## 2014-10-20 NOTE — Telephone Encounter (Signed)
per pof to sch pt aopt-sent back to lab to have port deaccessed-adv CS will call w/CT appt-pt understood

## 2014-11-06 ENCOUNTER — Ambulatory Visit (HOSPITAL_COMMUNITY): Payer: Medicaid Other

## 2014-11-08 ENCOUNTER — Ambulatory Visit (HOSPITAL_COMMUNITY)
Admission: RE | Admit: 2014-11-08 | Discharge: 2014-11-08 | Disposition: A | Discharge status: Home / Self Care | Payer: Medicaid Other | Source: Ambulatory Visit | Attending: Nurse Practitioner | Admitting: Nurse Practitioner

## 2014-11-08 DIAGNOSIS — C50911 Malignant neoplasm of unspecified site of right female breast: Secondary | ICD-10-CM | POA: Diagnosis present

## 2014-11-08 DIAGNOSIS — C799 Secondary malignant neoplasm of unspecified site: Secondary | ICD-10-CM | POA: Diagnosis not present

## 2014-11-08 MED ORDER — IOHEXOL 300 MG/ML  SOLN
100.0000 mL | Freq: Once | INTRAMUSCULAR | Status: AC | PRN
  Administered 2014-11-08: 100 mL via INTRAVENOUS

## 2014-11-09 ENCOUNTER — Other Ambulatory Visit: Payer: Medicaid Other

## 2014-11-15 ENCOUNTER — Other Ambulatory Visit: Payer: Self-pay | Admitting: Oncology

## 2014-11-16 ENCOUNTER — Other Ambulatory Visit: Payer: Self-pay | Admitting: *Deleted

## 2014-11-16 DIAGNOSIS — C50911 Malignant neoplasm of unspecified site of right female breast: Secondary | ICD-10-CM

## 2014-11-16 MED ORDER — LOSARTAN POTASSIUM 100 MG PO TABS: 100.0000 mg | ORAL_TABLET | Freq: Every day | ORAL | Status: DC

## 2014-11-17 ENCOUNTER — Ambulatory Visit (HOSPITAL_BASED_OUTPATIENT_CLINIC_OR_DEPARTMENT_OTHER): Payer: Medicaid Other

## 2014-11-17 ENCOUNTER — Other Ambulatory Visit: Payer: Medicaid Other

## 2014-11-17 VITALS — BP 125/86 | HR 92 | Temp 98.3°F

## 2014-11-17 DIAGNOSIS — C50911 Malignant neoplasm of unspecified site of right female breast: Secondary | ICD-10-CM

## 2014-11-17 DIAGNOSIS — C50111 Malignant neoplasm of central portion of right female breast: Secondary | ICD-10-CM

## 2014-11-17 DIAGNOSIS — C7960 Secondary malignant neoplasm of unspecified ovary: Secondary | ICD-10-CM

## 2014-11-17 DIAGNOSIS — C786 Secondary malignant neoplasm of retroperitoneum and peritoneum: Secondary | ICD-10-CM

## 2014-11-17 DIAGNOSIS — C50412 Malignant neoplasm of upper-outer quadrant of left female breast: Secondary | ICD-10-CM

## 2014-11-17 DIAGNOSIS — C773 Secondary and unspecified malignant neoplasm of axilla and upper limb lymph nodes: Secondary | ICD-10-CM

## 2014-11-17 DIAGNOSIS — C7982 Secondary malignant neoplasm of genital organs: Secondary | ICD-10-CM

## 2014-11-17 DIAGNOSIS — Z95828 Presence of other vascular implants and grafts: Secondary | ICD-10-CM

## 2014-11-17 DIAGNOSIS — C792 Secondary malignant neoplasm of skin: Secondary | ICD-10-CM

## 2014-11-17 LAB — COMPREHENSIVE METABOLIC PANEL (CC13)
ALT: 212 U/L — ABNORMAL HIGH (ref 0–55)
AST: 160 U/L — ABNORMAL HIGH (ref 5–34)
Albumin: 3.9 g/dL (ref 3.5–5.0)
Alkaline Phosphatase: 304 U/L — ABNORMAL HIGH (ref 40–150)
Anion Gap: 7 mEq/L (ref 3–11)
BILIRUBIN TOTAL: 0.58 mg/dL (ref 0.20–1.20)
BUN: 9.5 mg/dL (ref 7.0–26.0)
CALCIUM: 9.4 mg/dL (ref 8.4–10.4)
CO2: 24 mEq/L (ref 22–29)
Chloride: 109 mEq/L (ref 98–109)
Creatinine: 0.7 mg/dL (ref 0.6–1.1)
EGFR: >90 mL/min/{1.73_m2} (ref >90)
Glucose: 90 mg/dl (ref 70–140)
Potassium: 3.6 mEq/L (ref 3.5–5.1)
Sodium: 140 mEq/L (ref 136–145)
Total Protein: 7.2 g/dL (ref 6.4–8.3)

## 2014-11-17 LAB — CBC WITH DIFFERENTIAL/PLATELET
BASO%: 0 % (ref 0.0–2.0)
BASOS ABS: 0 10*3/uL (ref 0.0–0.1)
EOS%: 3.3 % (ref 0.0–7.0)
Eosinophils Absolute: 0.1 10*3/uL (ref 0.0–0.5)
HCT: 35 % (ref 34.8–46.6)
HEMOGLOBIN: 11.3 g/dL — AB (ref 11.6–15.9)
LYMPH%: 35.3 % (ref 14.0–49.7)
MCH: 26.1 pg (ref 25.1–34.0)
MCHC: 32.3 g/dL (ref 31.5–36.0)
MCV: 80.8 fL (ref 79.5–101.0)
MONO#: 0.3 10*3/uL (ref 0.1–0.9)
MONO%: 10.5 % (ref 0.0–14.0)
NEUT%: 50.9 % (ref 38.4–76.8)
NEUTROS ABS: 1.4 10*3/uL — AB (ref 1.5–6.5)
Platelets: 209 10*3/uL (ref 145–400)
RBC: 4.33 10*6/uL (ref 3.70–5.45)
RDW: 15.1 % — ABNORMAL HIGH (ref 11.2–14.5)
WBC: 2.8 10*3/uL — ABNORMAL LOW (ref 3.9–10.3)
lymph#: 1 10*3/uL (ref 0.9–3.3)

## 2014-11-17 MED ORDER — SODIUM CHLORIDE 0.9 % IJ SOLN
10.0000 mL | INTRAMUSCULAR | Status: DC | PRN
  Administered 2014-11-17: 10 mL via INTRAVENOUS
  Filled 2014-11-17: qty 10

## 2014-11-17 MED ORDER — HEPARIN SOD (PORK) LOCK FLUSH 100 UNIT/ML IV SOLN
500.0000 [IU] | Freq: Once | INTRAVENOUS | Status: AC
  Administered 2014-11-17: 500 [IU] via INTRAVENOUS
  Filled 2014-11-17: qty 5

## 2014-11-17 NOTE — Patient Instructions (Signed)

## 2014-11-20 ENCOUNTER — Telehealth: Payer: Self-pay | Admitting: Oncology

## 2014-11-20 ENCOUNTER — Ambulatory Visit (HOSPITAL_BASED_OUTPATIENT_CLINIC_OR_DEPARTMENT_OTHER): Payer: Medicaid Other | Admitting: Oncology

## 2014-11-20 ENCOUNTER — Telehealth: Payer: Self-pay | Admitting: *Deleted

## 2014-11-20 VITALS — BP 106/73 | HR 100 | Temp 98.2°F | Resp 18 | Ht 63.0 in | Wt 150.2 lb

## 2014-11-20 DIAGNOSIS — C50412 Malignant neoplasm of upper-outer quadrant of left female breast: Secondary | ICD-10-CM

## 2014-11-20 DIAGNOSIS — C7982 Secondary malignant neoplasm of genital organs: Secondary | ICD-10-CM

## 2014-11-20 DIAGNOSIS — C7960 Secondary malignant neoplasm of unspecified ovary: Secondary | ICD-10-CM

## 2014-11-20 DIAGNOSIS — D509 Iron deficiency anemia, unspecified: Secondary | ICD-10-CM

## 2014-11-20 DIAGNOSIS — I10 Essential (primary) hypertension: Secondary | ICD-10-CM

## 2014-11-20 DIAGNOSIS — F329 Major depressive disorder, single episode, unspecified: Secondary | ICD-10-CM

## 2014-11-20 DIAGNOSIS — R7989 Other specified abnormal findings of blood chemistry: Secondary | ICD-10-CM

## 2014-11-20 DIAGNOSIS — C786 Secondary malignant neoplasm of retroperitoneum and peritoneum: Secondary | ICD-10-CM

## 2014-11-20 DIAGNOSIS — E049 Nontoxic goiter, unspecified: Secondary | ICD-10-CM

## 2014-11-20 DIAGNOSIS — K7460 Unspecified cirrhosis of liver: Secondary | ICD-10-CM

## 2014-11-20 DIAGNOSIS — C50411 Malignant neoplasm of upper-outer quadrant of right female breast: Secondary | ICD-10-CM

## 2014-11-20 DIAGNOSIS — C50911 Malignant neoplasm of unspecified site of right female breast: Secondary | ICD-10-CM

## 2014-11-20 DIAGNOSIS — C7989 Secondary malignant neoplasm of other specified sites: Secondary | ICD-10-CM

## 2014-11-20 DIAGNOSIS — N951 Menopausal and female climacteric states: Secondary | ICD-10-CM

## 2014-11-20 DIAGNOSIS — C773 Secondary and unspecified malignant neoplasm of axilla and upper limb lymph nodes: Secondary | ICD-10-CM

## 2014-11-20 DIAGNOSIS — C762 Malignant neoplasm of abdomen: Secondary | ICD-10-CM

## 2014-11-20 DIAGNOSIS — C792 Secondary malignant neoplasm of skin: Secondary | ICD-10-CM

## 2014-11-20 DIAGNOSIS — F17200 Nicotine dependence, unspecified, uncomplicated: Secondary | ICD-10-CM

## 2014-11-20 DIAGNOSIS — C50919 Malignant neoplasm of unspecified site of unspecified female breast: Secondary | ICD-10-CM

## 2014-11-20 DIAGNOSIS — C50111 Malignant neoplasm of central portion of right female breast: Secondary | ICD-10-CM

## 2014-11-20 DIAGNOSIS — C7951 Secondary malignant neoplasm of bone: Secondary | ICD-10-CM

## 2014-11-20 DIAGNOSIS — L989 Disorder of the skin and subcutaneous tissue, unspecified: Secondary | ICD-10-CM

## 2014-11-20 MED ORDER — EXEMESTANE 25 MG PO TABS: 25.0000 mg | ORAL_TABLET | Freq: Every day | ORAL | Status: DC

## 2014-11-20 MED ORDER — EVEROLIMUS 5 MG PO TABS: 5.0000 mg | ORAL_TABLET | Freq: Every day | ORAL | Status: DC

## 2014-11-20 NOTE — Addendum Note (Signed)
Addended by: Laureen Abrahams on: 11/20/2014 05:55 PM   Modules accepted: Orders, Medications

## 2014-11-20 NOTE — Telephone Encounter (Signed)
per pof to sch pt appt-gave pt copy-sent MW wmailt o sch trmt-adv pt can get updated copy in Feb-pt understood-adv could get contrast in Feb

## 2014-11-20 NOTE — Telephone Encounter (Signed)
Per staff message and POF I have scheduled appts. Advised scheduler of appts. JMW  

## 2014-11-20 NOTE — Progress Notes (Signed)
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 combination of exemestane and everolimus. She is tolerating it remarkably well. The plan accordingly he is going to be to continue that and restage in 3 months. Since we don't have significant findings in the lungs we will only do a chest x-ray at that time, but we will of course repeat a CT of the abdomen and pelvis with contrast to evaluate the liver lesions and carcinomatosis question.  I have asked her to weigh herself every morning and if her weight goes up 5 pounds to let [place]. She understands this might indicate that she is picking up some ascites. Her weight however has been remarkably stable to this point.  Even though the skin is better, her liver functions are elevated. These scans also shows some evidence of cirrhosis. I think she is having inflammation  in the liver secondary to cancer there dieting. Of course medication also can irritate the liver. Accordingly we are going to follow these labs on a monthly basis. If she has the labs drawn through her port, as I suggest, then she will be automatically flushed as well. That will help to keep the port open. Just in case however I am going to check her for hepatitis B and C within the next set of labs.  Tammy Blanchard has a good  understanding of this plan. She agrees with it. She knows the goal of treatment in her case is control. She will call with any problems that may develop before her next visit here.   Chauncey Cruel, MD

## 2014-12-15 ENCOUNTER — Telehealth: Payer: Self-pay | Admitting: Nurse Practitioner

## 2014-12-15 ENCOUNTER — Other Ambulatory Visit: Payer: Self-pay | Admitting: Nurse Practitioner

## 2014-12-15 ENCOUNTER — Ambulatory Visit: Payer: Medicaid Other

## 2014-12-15 ENCOUNTER — Other Ambulatory Visit (HOSPITAL_BASED_OUTPATIENT_CLINIC_OR_DEPARTMENT_OTHER): Payer: Medicaid Other

## 2014-12-15 DIAGNOSIS — C7989 Secondary malignant neoplasm of other specified sites: Secondary | ICD-10-CM

## 2014-12-15 DIAGNOSIS — C50412 Malignant neoplasm of upper-outer quadrant of left female breast: Secondary | ICD-10-CM

## 2014-12-15 DIAGNOSIS — C50919 Malignant neoplasm of unspecified site of unspecified female breast: Secondary | ICD-10-CM

## 2014-12-15 DIAGNOSIS — C7960 Secondary malignant neoplasm of unspecified ovary: Secondary | ICD-10-CM

## 2014-12-15 DIAGNOSIS — F17200 Nicotine dependence, unspecified, uncomplicated: Secondary | ICD-10-CM

## 2014-12-15 DIAGNOSIS — I10 Essential (primary) hypertension: Secondary | ICD-10-CM

## 2014-12-15 DIAGNOSIS — Z95828 Presence of other vascular implants and grafts: Secondary | ICD-10-CM

## 2014-12-15 DIAGNOSIS — C7951 Secondary malignant neoplasm of bone: Secondary | ICD-10-CM

## 2014-12-15 DIAGNOSIS — C7982 Secondary malignant neoplasm of genital organs: Secondary | ICD-10-CM

## 2014-12-15 DIAGNOSIS — E049 Nontoxic goiter, unspecified: Secondary | ICD-10-CM

## 2014-12-15 DIAGNOSIS — C50911 Malignant neoplasm of unspecified site of right female breast: Secondary | ICD-10-CM

## 2014-12-15 DIAGNOSIS — D509 Iron deficiency anemia, unspecified: Secondary | ICD-10-CM

## 2014-12-15 DIAGNOSIS — C762 Malignant neoplasm of abdomen: Secondary | ICD-10-CM

## 2014-12-15 DIAGNOSIS — C50411 Malignant neoplasm of upper-outer quadrant of right female breast: Secondary | ICD-10-CM

## 2014-12-15 LAB — COMPREHENSIVE METABOLIC PANEL (CC13)
ALK PHOS: 588 U/L — AB (ref 40–150)
ALT: 147 U/L — AB (ref 0–55)
ANION GAP: 10 meq/L (ref 3–11)
AST: 233 U/L (ref 5–34)
Albumin: 3.2 g/dL — ABNORMAL LOW (ref 3.5–5.0)
BILIRUBIN TOTAL: 0.89 mg/dL (ref 0.20–1.20)
BUN: 11.8 mg/dL (ref 7.0–26.0)
CO2: 22 mEq/L (ref 22–29)
CREATININE: 0.8 mg/dL (ref 0.6–1.1)
Calcium: 9.4 mg/dL (ref 8.4–10.4)
Chloride: 109 mEq/L (ref 98–109)
EGFR: >90 mL/min/{1.73_m2} (ref >90)
GLUCOSE: 91 mg/dL (ref 70–140)
Potassium: 3.7 mEq/L (ref 3.5–5.1)
Sodium: 141 mEq/L (ref 136–145)
Total Protein: 6.8 g/dL (ref 6.4–8.3)

## 2014-12-15 LAB — CBC WITH DIFFERENTIAL/PLATELET
BASO%: 0.8 % (ref 0.0–2.0)
Basophils Absolute: 0 10*3/uL (ref 0.0–0.1)
EOS%: 1.6 % (ref 0.0–7.0)
Eosinophils Absolute: 0 10*3/uL (ref 0.0–0.5)
HCT: 36.4 % (ref 34.8–46.6)
HGB: 11.4 g/dL — ABNORMAL LOW (ref 11.6–15.9)
LYMPH%: 33.4 % (ref 14.0–49.7)
MCH: 24.4 pg — ABNORMAL LOW (ref 25.1–34.0)
MCHC: 31.2 g/dL — ABNORMAL LOW (ref 31.5–36.0)
MCV: 78.4 fL — ABNORMAL LOW (ref 79.5–101.0)
MONO#: 0.1 10*3/uL (ref 0.1–0.9)
MONO%: 3.6 % (ref 0.0–14.0)
NEUT#: 1.6 10*3/uL (ref 1.5–6.5)
NEUT%: 60.6 % (ref 38.4–76.8)
Platelets: 129 10*3/uL — ABNORMAL LOW (ref 145–400)
RBC: 4.64 10*6/uL (ref 3.70–5.45)
RDW: 15.8 % — ABNORMAL HIGH (ref 11.2–14.5)
WBC: 2.7 10*3/uL — ABNORMAL LOW (ref 3.9–10.3)
lymph#: 0.9 10*3/uL (ref 0.9–3.3)

## 2014-12-15 LAB — HEPATITIS B CORE ANTIBODY, IGM: Hep B C IgM: NONREACTIVE

## 2014-12-15 LAB — HEPATITIS B SURFACE ANTIGEN: HEP B S AG: NEGATIVE

## 2014-12-15 LAB — HEPATITIS C ANTIBODY: HCV AB: NEGATIVE

## 2014-12-15 MED ORDER — HEPARIN SOD (PORK) LOCK FLUSH 100 UNIT/ML IV SOLN
500.0000 [IU] | Freq: Once | INTRAVENOUS | Status: AC
  Administered 2014-12-15: 500 [IU] via INTRAVENOUS
  Filled 2014-12-15: qty 5

## 2014-12-15 MED ORDER — SODIUM CHLORIDE 0.9 % IJ SOLN
10.0000 mL | INTRAMUSCULAR | Status: DC | PRN
  Administered 2014-12-15: 10 mL via INTRAVENOUS
  Filled 2014-12-15: qty 10

## 2014-12-15 NOTE — Telephone Encounter (Signed)
Alert lab values reported. Alk phos 588 (H), AST 233 (HH), ALT 147 (H). Consulted with Dr. Magrinat. He suggests stopping the everolimus and rechecking labs in 1 week. Called patient with this information. She will stop the drug as directed. POF placed for morning lab appointment on 2/19.  

## 2014-12-17 ENCOUNTER — Other Ambulatory Visit: Payer: Self-pay | Admitting: Oncology

## 2014-12-18 ENCOUNTER — Telehealth: Payer: Self-pay | Admitting: Oncology

## 2014-12-19 ENCOUNTER — Other Ambulatory Visit (HOSPITAL_BASED_OUTPATIENT_CLINIC_OR_DEPARTMENT_OTHER): Payer: Medicaid Other

## 2014-12-19 ENCOUNTER — Telehealth: Payer: Self-pay | Admitting: *Deleted

## 2014-12-19 ENCOUNTER — Ambulatory Visit (HOSPITAL_BASED_OUTPATIENT_CLINIC_OR_DEPARTMENT_OTHER): Payer: Medicaid Other | Admitting: Nurse Practitioner

## 2014-12-19 ENCOUNTER — Other Ambulatory Visit: Payer: Self-pay | Admitting: Nurse Practitioner

## 2014-12-19 ENCOUNTER — Ambulatory Visit: Payer: Medicaid Other

## 2014-12-19 VITALS — Wt 146.2 lb

## 2014-12-19 VITALS — BP 102/64 | HR 108 | Temp 99.6°F

## 2014-12-19 DIAGNOSIS — R109 Unspecified abdominal pain: Secondary | ICD-10-CM

## 2014-12-19 DIAGNOSIS — Z95828 Presence of other vascular implants and grafts: Secondary | ICD-10-CM

## 2014-12-19 DIAGNOSIS — R1010 Upper abdominal pain, unspecified: Secondary | ICD-10-CM

## 2014-12-19 DIAGNOSIS — C50919 Malignant neoplasm of unspecified site of unspecified female breast: Secondary | ICD-10-CM

## 2014-12-19 DIAGNOSIS — R740 Nonspecific elevation of levels of transaminase and lactic acid dehydrogenase [LDH]: Secondary | ICD-10-CM

## 2014-12-19 DIAGNOSIS — C7982 Secondary malignant neoplasm of genital organs: Secondary | ICD-10-CM

## 2014-12-19 DIAGNOSIS — C7951 Secondary malignant neoplasm of bone: Secondary | ICD-10-CM

## 2014-12-19 DIAGNOSIS — R7401 Elevation of levels of liver transaminase levels: Secondary | ICD-10-CM

## 2014-12-19 DIAGNOSIS — C7989 Secondary malignant neoplasm of other specified sites: Principal | ICD-10-CM

## 2014-12-19 DIAGNOSIS — R1310 Dysphagia, unspecified: Secondary | ICD-10-CM

## 2014-12-19 DIAGNOSIS — C50412 Malignant neoplasm of upper-outer quadrant of left female breast: Secondary | ICD-10-CM

## 2014-12-19 DIAGNOSIS — C7952 Secondary malignant neoplasm of bone marrow: Secondary | ICD-10-CM

## 2014-12-19 DIAGNOSIS — C50411 Malignant neoplasm of upper-outer quadrant of right female breast: Secondary | ICD-10-CM

## 2014-12-19 DIAGNOSIS — C50911 Malignant neoplasm of unspecified site of right female breast: Secondary | ICD-10-CM

## 2014-12-19 DIAGNOSIS — C7960 Secondary malignant neoplasm of unspecified ovary: Secondary | ICD-10-CM

## 2014-12-19 DIAGNOSIS — C762 Malignant neoplasm of abdomen: Secondary | ICD-10-CM

## 2014-12-19 LAB — CBC WITH DIFFERENTIAL/PLATELET
BASO%: 0 % (ref 0.0–2.0)
Basophils Absolute: 0 10*3/uL (ref 0.0–0.1)
EOS%: 0.4 % (ref 0.0–7.0)
Eosinophils Absolute: 0 10*3/uL (ref 0.0–0.5)
HCT: 33.2 % — ABNORMAL LOW (ref 34.8–46.6)
HEMOGLOBIN: 10.8 g/dL — AB (ref 11.6–15.9)
LYMPH%: 29.8 % (ref 14.0–49.7)
MCH: 25 pg — AB (ref 25.1–34.0)
MCHC: 32.5 g/dL (ref 31.5–36.0)
MCV: 76.9 fL — AB (ref 79.5–101.0)
MONO#: 0.3 10*3/uL (ref 0.1–0.9)
MONO%: 10.9 % (ref 0.0–14.0)
NEUT#: 1.6 10*3/uL (ref 1.5–6.5)
NEUT%: 58.9 % (ref 38.4–76.8)
NRBC: 0 % (ref 0–0)
Platelets: 107 10*3/uL — ABNORMAL LOW (ref 145–400)
RBC: 4.32 10*6/uL (ref 3.70–5.45)
RDW: 15.1 % — AB (ref 11.2–14.5)
WBC: 2.7 10*3/uL — ABNORMAL LOW (ref 3.9–10.3)
lymph#: 0.8 10*3/uL — ABNORMAL LOW (ref 0.9–3.3)

## 2014-12-19 LAB — COMPREHENSIVE METABOLIC PANEL (CC13)
ALBUMIN: 3.1 g/dL — AB (ref 3.5–5.0)
ALT: 127 U/L — AB (ref 0–55)
AST: 263 U/L — AB (ref 5–34)
Alkaline Phosphatase: 585 U/L — ABNORMAL HIGH (ref 40–150)
Anion Gap: 12 mEq/L — ABNORMAL HIGH (ref 3–11)
BUN: 12.6 mg/dL (ref 7.0–26.0)
CALCIUM: 9.6 mg/dL (ref 8.4–10.4)
CHLORIDE: 107 meq/L (ref 98–109)
CO2: 20 mEq/L — ABNORMAL LOW (ref 22–29)
Creatinine: 0.8 mg/dL (ref 0.6–1.1)
Glucose: 99 mg/dl (ref 70–140)
POTASSIUM: 3.8 meq/L (ref 3.5–5.1)
Sodium: 139 mEq/L (ref 136–145)
TOTAL PROTEIN: 6.8 g/dL (ref 6.4–8.3)
Total Bilirubin: 1.42 mg/dL — ABNORMAL HIGH (ref 0.20–1.20)

## 2014-12-19 MED ORDER — SODIUM CHLORIDE 0.9 % IJ SOLN
10.0000 mL | INTRAMUSCULAR | Status: DC | PRN
  Administered 2014-12-19: 10 mL via INTRAVENOUS
  Filled 2014-12-19: qty 10

## 2014-12-19 MED ORDER — HEPARIN SOD (PORK) LOCK FLUSH 100 UNIT/ML IV SOLN
500.0000 [IU] | Freq: Once | INTRAVENOUS | Status: AC
  Administered 2014-12-19: 500 [IU] via INTRAVENOUS
  Filled 2014-12-19: qty 5

## 2014-12-19 MED ORDER — OXYCODONE HCL 5 MG PO TABS: 5.0000 mg | ORAL_TABLET | Freq: Four times a day (QID) | ORAL | Status: DC | PRN

## 2014-12-19 MED ORDER — ONDANSETRON 8 MG/NS 50 ML IVPB
INTRAVENOUS | Status: AC
Start: 2014-12-19 — End: 2014-12-19
  Filled 2014-12-19: qty 8

## 2014-12-19 NOTE — Patient Instructions (Signed)

## 2014-12-19 NOTE — Telephone Encounter (Signed)
Pt left message stating she " has been down in the bed sick for the last 3 days and am wondering if it could be related to the liver concerns "  Pt mentioned on VM symptoms of " nausea and wanting to vomit but not" " trying to eat but take a few bites and am full " " was having diarrhea - now I am constipated" " pain in my stomach and shooting up to my chest "  This RN returned call to pt and and discussed above and noted increase in liver enzymes per CMET-  Note abnormal liver readings per MD probable related to everolimus-.  Above discussed with pt with recommendation to come in today for symptom management visit for better assessment and interventions.  Appointment made with pt in agreement of plan.

## 2014-12-20 ENCOUNTER — Telehealth: Payer: Self-pay | Admitting: Oncology

## 2014-12-20 ENCOUNTER — Ambulatory Visit (HOSPITAL_COMMUNITY)
Admission: RE | Admit: 2014-12-20 | Discharge: 2014-12-20 | Disposition: A | Discharge status: Home / Self Care | Payer: Medicaid Other | Source: Ambulatory Visit | Attending: Nurse Practitioner | Admitting: Nurse Practitioner

## 2014-12-20 ENCOUNTER — Encounter: Payer: Self-pay | Admitting: Nurse Practitioner

## 2014-12-20 ENCOUNTER — Ambulatory Visit (HOSPITAL_BASED_OUTPATIENT_CLINIC_OR_DEPARTMENT_OTHER): Payer: Medicaid Other | Admitting: Oncology

## 2014-12-20 ENCOUNTER — Telehealth: Payer: Self-pay | Admitting: Nurse Practitioner

## 2014-12-20 VITALS — BP 96/60 | HR 101 | Temp 99.8°F | Resp 18 | Ht 63.0 in | Wt 146.7 lb

## 2014-12-20 DIAGNOSIS — C762 Malignant neoplasm of abdomen: Secondary | ICD-10-CM

## 2014-12-20 DIAGNOSIS — C787 Secondary malignant neoplasm of liver and intrahepatic bile duct: Secondary | ICD-10-CM

## 2014-12-20 DIAGNOSIS — R109 Unspecified abdominal pain: Secondary | ICD-10-CM | POA: Insufficient documentation

## 2014-12-20 DIAGNOSIS — C7961 Secondary malignant neoplasm of right ovary: Secondary | ICD-10-CM

## 2014-12-20 DIAGNOSIS — C50411 Malignant neoplasm of upper-outer quadrant of right female breast: Secondary | ICD-10-CM

## 2014-12-20 DIAGNOSIS — C7989 Secondary malignant neoplasm of other specified sites: Secondary | ICD-10-CM

## 2014-12-20 DIAGNOSIS — C801 Malignant (primary) neoplasm, unspecified: Secondary | ICD-10-CM

## 2014-12-20 DIAGNOSIS — K769 Liver disease, unspecified: Secondary | ICD-10-CM | POA: Diagnosis present

## 2014-12-20 DIAGNOSIS — C792 Secondary malignant neoplasm of skin: Secondary | ICD-10-CM

## 2014-12-20 DIAGNOSIS — Z853 Personal history of malignant neoplasm of breast: Secondary | ICD-10-CM | POA: Insufficient documentation

## 2014-12-20 DIAGNOSIS — C786 Secondary malignant neoplasm of retroperitoneum and peritoneum: Secondary | ICD-10-CM

## 2014-12-20 DIAGNOSIS — K5900 Constipation, unspecified: Secondary | ICD-10-CM

## 2014-12-20 DIAGNOSIS — R740 Nonspecific elevation of levels of transaminase and lactic acid dehydrogenase [LDH]: Secondary | ICD-10-CM

## 2014-12-20 DIAGNOSIS — I10 Essential (primary) hypertension: Secondary | ICD-10-CM

## 2014-12-20 DIAGNOSIS — R7401 Elevation of levels of liver transaminase levels: Secondary | ICD-10-CM | POA: Insufficient documentation

## 2014-12-20 DIAGNOSIS — R630 Anorexia: Secondary | ICD-10-CM

## 2014-12-20 DIAGNOSIS — C7982 Secondary malignant neoplasm of genital organs: Secondary | ICD-10-CM

## 2014-12-20 DIAGNOSIS — C50919 Malignant neoplasm of unspecified site of unspecified female breast: Secondary | ICD-10-CM

## 2014-12-20 DIAGNOSIS — R1310 Dysphagia, unspecified: Secondary | ICD-10-CM | POA: Insufficient documentation

## 2014-12-20 DIAGNOSIS — C7951 Secondary malignant neoplasm of bone: Secondary | ICD-10-CM

## 2014-12-20 DIAGNOSIS — C50412 Malignant neoplasm of upper-outer quadrant of left female breast: Secondary | ICD-10-CM

## 2014-12-20 DIAGNOSIS — R439 Unspecified disturbances of smell and taste: Secondary | ICD-10-CM

## 2014-12-20 MED ORDER — HEPARIN SOD (PORK) LOCK FLUSH 10 UNIT/ML IV SOLN
500.0000 [IU] | Freq: Once | INTRAVENOUS | Status: DC
  Filled 2014-12-20: qty 50

## 2014-12-20 MED ORDER — HEPARIN SOD (PORK) LOCK FLUSH 100 UNIT/ML IV SOLN
INTRAVENOUS | Status: AC
  Administered 2014-12-20: 500 [IU]
  Filled 2014-12-20: qty 5

## 2014-12-20 MED ORDER — GADOBENATE DIMEGLUMINE 529 MG/ML IV SOLN
15.0000 mL | Freq: Once | INTRAVENOUS | Status: AC | PRN
  Administered 2014-12-20: 14 mL via INTRAVENOUS

## 2014-12-20 NOTE — Assessment & Plan Note (Signed)
Patient is status post bilateral mastectomy.  Patient continues to take Aromasin as previously directed.  Patient was taking oral Afinitor therapy until last week-when she was instructed to hold due to transaminitis.  Also, patient continues to receive Zometa infusions for her previously diagnosed bone metastasis on an every 12 week basis.  Her next Zometa infusion is scheduled for 12/27/2014.

## 2014-12-20 NOTE — Assessment & Plan Note (Signed)
Alkaline phosphatase also remains elevated at 585.  Most likely, this is secondary to elevated liver enzymes and increased abdominal discomfort and bloating.  Liver MRI pending for tomorrow.

## 2014-12-20 NOTE — Assessment & Plan Note (Signed)
Bilirubin has increased from 0.89 up to 1.4 to since last lab draw.  Patient is also complaining of increased abdominal discomfort and bloating.  Will obtain a liver MRI tomorrow for further evaluation and management.

## 2014-12-20 NOTE — Assessment & Plan Note (Addendum)
Patient complaining of increased upper abdominal pain/discomfort since this past weekend.  She is also complaining of some chronic nausea; but no vomiting.  She denies any diarrhea or constipation; stating that she had a bowel movement earlier today.  She has minimal appetite; and feels bloated.  She also feels that her abdomen is distended now.  Labs today reveal further elevation of AST up to 263.  Bilirubin is actually increased to from 0.89 up to 1.42.  On exam today patient with generalized abdominal tenderness; and specific increased tenderness to right upper quadrant with palpation.  No rebound tenderness.  Bowel sounds positive in all 4 quads.  Patient's abdomen does appear distended; and mildly firm today.  Patient was afebrile while at the [facility] today.  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  Will order a stat liver MRI to be obtained tomorrow morning at 8 AM.  Patient has plans to return to review scans with Dr. [name] tomorrow afternoon 12/20/2014.  Also, patient was switched from hydrocodone to oxycodone due to the Tylenol contained and hydrocodone.  Patient was advised she may try taking half of one oxycodone if needed for abdominal pain.  Patient was advised to call/return or go directly to the emergency department if she develops any worsening symptoms whatsoever.

## 2014-12-20 NOTE — Telephone Encounter (Signed)
per pof to sch appt-sch pt appt-pt to get updated sch on next appt

## 2014-12-20 NOTE — Telephone Encounter (Signed)
Opened by mistake.

## 2014-12-20 NOTE — Assessment & Plan Note (Signed)
Patient has a history of chronic dysphagia; and underwent a esophageal dilatation per [facility] on 03/30/2014.  Patient states that she is now once again having some mild difficulty swallowing specific foods and sometimes water as well.  Advised patient to follow-up with [facility] for further evaluation and management.
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
## 2014-12-20 NOTE — Assessment & Plan Note (Signed)
AST continues to trend upward; with elevation from 233 up to 263 today.  Patient also complaining of increased abdominal discomfort and bloating; with possible ascites now.  Patient has been holding her oral Afinitor therapy since last week due to transaminitis.  Patient is scheduled for a liver MRI first thing in the morning; and plans to follow-up with Dr. [name] to review scan results tomorrow afternoon 12/20/2014.

## 2014-12-20 NOTE — Progress Notes (Signed)
SYMPTOM MANAGEMENT CLINIC   HPI: Tammy Blanchard 49 y.o. female diagnosed with breast cancer; with  Metastasis to the bones, uterus, ovaries, liver, and skin.  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  Currently undergoing both oral Afinitor therapy and Aromasin.  Also receives Zometa infusions on an every 12 week basis.  Patient called the cancer Center today requesting urgent care visit.  Patient states that she has been experiencing significant abdominal discomfort, nausea, and bloating feeling since this past weekend.  She denies any vomiting, diarrhea, or constipation.  She states that she had a normal bowel movement earlier today.  She's had minimal appetite and feels fairly fatigued.  She feels that her abdomen is distended today as well.  Patient states that she has developed elevated liver enzymes within the past 1-2 weeks; and has been instructed to hold her oral Afinitor therapy since last week.  She denies any recent fevers or chills.   HPI  ROS  Past Medical History  Diagnosis Date  . Hypertension   . Allergy   . GERD (gastroesophageal reflux disease)   . Thyroid disease   . Anemia     resolved 2011  . Hyperlipidemia     controlled  . History of blood transfusion 2009    [facility] -  UNKNOWN NUMBER OF UNITS TRANSFUSED  . 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 Cesarean section    . Wisdom tooth extraction    . Tubal ligation    . Breast surgery  10/2009    right lymp nodes removed  . Left foot surgery    . Abdominal hysterectomy  11/25/2012    Procedure: HYSTERECTOMY ABDOMINAL;  Surgeon: [name], MD;  Location: [facility];  Service: Gynecology;  Laterality: N/A;  with Bilateral Salpingoopherectomy and Cystoscopy  . Mastectomy complete / simple Bilateral 05/25/2013  . Mass biopsy  05/25/2013    on abdomen and right chest wall, and Right neck Archie Endo 05/25/2013  . Total  mastectomy Bilateral 05/25/2013    Dr [name]   . Total mastectomy Bilateral 05/25/2013    Procedure: Bilateral Total Mastectomy;  Surgeon: Merrie Roof, MD;  Location: [facility];  Service: General;  Laterality: Bilateral;  . 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; Goiter; HYPERTHYROIDISM; HYPERLIPIDEMIA; Iron deficiency anemia; TOBACCO ABUSE; HYPERTENSION; ALLERGIC RHINITIS; REACTIVE AIRWAY DISEASE; GERD; CONSTIPATION; CALLUSES, FEET, BILATERAL; JOINT EFFUSION, LEFT KNEE; SUBSCAPULARIS SPRAIN AND STRAIN; MICROSCOPIC HEMATURIA; VAGINAL DISCHARGE; ADVERSE DRUG REACTION; Vision changes; Breast cancer metastasized to bone; Breast cancer of upper-outer quadrant of right female breast; Skin lesion of right arm; Anxiety; Insomnia; Hypokalemia; HTN (hypertension); Sore throat (viral); Depression with anxiety; Essential hypertension; Pap smear for cervical cancer screening; Peripheral neuropathy due to chemotherapy; Breast cancer metastasized to multiple sites; Mouth dryness; Irritation of gums and throat; Transaminitis; Abdominal pain; Hyperphosphatemia; Hyperbilirubinemia; and Dysphagia on her problem list.    is allergic to metronidazole; tegaderm ag mesh; and tramadol.    Medication List       This list is accurate as of: 12/19/14 11:59 PM.  Always use your most recent med list.               amLODipine 10 MG  tablet  Commonly known as:  NORVASC  Take 1 tablet (10 mg total) by mouth every morning.     everolimus 5 MG tablet  Commonly known as:  AFINITOR  Take 1 tablet (5 mg total) by mouth daily.     exemestane 25 MG tablet  Commonly known as:  AROMASIN  Take 1 tablet (25 mg total) by mouth daily after breakfast.     ibuprofen 200 MG tablet  Commonly known as:  ADVIL,MOTRIN  Take 400 mg by mouth  every 8 (eight) hours as needed.     lidocaine-prilocaine cream  Commonly known as:  EMLA  Apply 1 application topically as needed. Apply over portacath  1 1/2 hours to 2 hours prior to procedures as needed.     losartan 100 MG tablet  Commonly known as:  COZAAR  Take 1 tablet (100 mg total) by mouth daily.     oxyCODONE 5 MG immediate release tablet  Commonly known as:  Oxy IR/ROXICODONE  Take 1 tablet (5 mg total) by mouth every 6 (six) hours as needed for severe pain.     pantoprazole 40 MG tablet  Commonly known as:  PROTONIX  Take 1 tablet (40 mg total) by mouth daily.     PARoxetine 10 MG tablet  Commonly known as:  PAXIL  Take 1 tablet (10 mg total) by mouth daily.     prochlorperazine 10 MG tablet  Commonly known as:  COMPAZINE  Take 10 mg by mouth every 6 (six) hours as needed for nausea or vomiting.     ZOMETA IV  Inject 4 mg into the vein every 6 (six) weeks.         PHYSICAL EXAMINATION  Weight 146 lb 3.2 oz (66.316 kg), last menstrual period 11/12/2012.   102/64, HR 108, temp 99.6,   Physical Exam  Constitutional: She is oriented to person, place, and time. She appears malnourished. She appears unhealthy. She appears cachectic.  Patient appears more fatigued and weak than on previous exams.  HENT:  Head: Normocephalic and atraumatic.  Mouth/Throat: Oropharynx is clear and moist.  Eyes: Conjunctivae and EOM are normal. Pupils are equal, round, and reactive to light. Right eye exhibits no discharge. Left eye exhibits no discharge. No scleral icterus.  Neck: Normal range of motion. Neck supple. No JVD present. No tracheal deviation present. No thyromegaly present.  Cardiovascular: Normal rate, regular rhythm, normal heart sounds and intact distal pulses.   Pulmonary/Chest: Effort normal and breath sounds normal. No respiratory distress. She has no wheezes. She has no rales. She exhibits no tenderness.  Abdominal: Bowel sounds are normal. She exhibits  distension. She exhibits no mass. There is tenderness. There is no rebound and no guarding.  Abdomen slightly distended and slightly firm as well on exam today.  Tenderness to gentle abdomen; with increased discomfort to upper abdomen/right upper quadrant with palpation.  Musculoskeletal: She exhibits no edema or tenderness.  Lymphadenopathy:    She has no cervical adenopathy.  Neurological: She is alert and oriented to person, place, and time. Gait normal.  Skin: Skin is warm and dry. No rash noted. No erythema.  Psychiatric: Affect normal.  Nursing note and vitals reviewed.   LABORATORY DATA:. Appointment on 12/19/2014  Component Date Value Ref Range Status  . WBC 12/19/2014 2.7* 3.9 - 10.3 10e3/uL Final  . NEUT# 12/19/2014 1.6  1.5 - 6.5 10e3/uL Final  . HGB 12/19/2014 10.8* 11.6 - 15.9 g/dL Final  . HCT 12/19/2014 33.2* 34.8 -  46.6 % Final  . Platelets 12/19/2014 107* 145 - 400 10e3/uL Final  . MCV 12/19/2014 76.9* 79.5 - 101.0 fL Final  . MCH 12/19/2014 25.0* 25.1 - 34.0 pg Final  . MCHC 12/19/2014 32.5  31.5 - 36.0 g/dL Final  . RBC 12/19/2014 4.32  3.70 - 5.45 10e6/uL Final  . RDW 12/19/2014 15.1* 11.2 - 14.5 % Final  . lymph# 12/19/2014 0.8* 0.9 - 3.3 10e3/uL Final  . MONO# 12/19/2014 0.3  0.1 - 0.9 10e3/uL Final  . Eosinophils Absolute 12/19/2014 0.0  0.0 - 0.5 10e3/uL Final  . Basophils Absolute 12/19/2014 0.0  0.0 - 0.1 10e3/uL Final  . NEUT% 12/19/2014 58.9  38.4 - 76.8 % Final  . LYMPH% 12/19/2014 29.8  14.0 - 49.7 % Final  . MONO% 12/19/2014 10.9  0.0 - 14.0 % Final  . EOS% 12/19/2014 0.4  0.0 - 7.0 % Final  . BASO% 12/19/2014 0.0  0.0 - 2.0 % Final  . nRBC 12/19/2014 0  0 - 0 % Final  . Sodium 12/19/2014 139  136 - 145 mEq/L Final  . Potassium 12/19/2014 3.8  3.5 - 5.1 mEq/L Final  . Chloride 12/19/2014 107  98 - 109 mEq/L Final  . CO2 12/19/2014 20* 22 - 29 mEq/L Final  . Glucose 12/19/2014 99  70 - 140 mg/dl Final  . BUN 12/19/2014 12.6  7.0 - 26.0 mg/dL Final    . Creatinine 12/19/2014 0.8  0.6 - 1.1 mg/dL Final  . Total Bilirubin 12/19/2014 1.42* 0.20 - 1.20 mg/dL Final  . Alkaline Phosphatase 12/19/2014 585* 40 - 150 U/L Final  . AST 12/19/2014 263* 5 - 34 U/L Final  . ALT 12/19/2014 127* 0 - 55 U/L Final  . Total Protein 12/19/2014 6.8  6.4 - 8.3 g/dL Final  . Albumin 12/19/2014 3.1* 3.5 - 5.0 g/dL Final  . Calcium 12/19/2014 9.6  8.4 - 10.4 mg/dL Final  . Anion Gap 12/19/2014 12* 3 - 11 mEq/L Final  . EGFR 12/19/2014 >90  >90 ml/min/1.73 m2 Final   eGFR is calculated using the CKD-EPI Creatinine Equation (2009)     RADIOGRAPHIC STUDIES: No results found.  ASSESSMENT/PLAN:    Abdominal pain Patient complaining of increased upper abdominal pain/discomfort since this past weekend.  She is also complaining of some chronic nausea; but no vomiting.  She denies any diarrhea or constipation; stating that she had a bowel movement earlier today.  She has minimal appetite; and feels bloated.  She also feels that her abdomen is distended now.  Labs today reveal further elevation of AST up to 263.  Bilirubin is actually increased to from 0.89 up to 1.42.  On exam today patient with generalized abdominal tenderness; and specific increased tenderness to right upper quadrant with palpation.  No rebound tenderness.  Bowel sounds positive in all 4 quads.  Patient's abdomen does appear distended; and mildly firm today.  Patient was afebrile while at the [facility] today.  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  Will order a stat liver MRI to be obtained tomorrow morning at 8 AM.  Patient has plans to return to review scans with Dr. [name] tomorrow afternoon 12/20/2014.  Also, patient was switched from hydrocodone to oxycodone due to the Tylenol contained and hydrocodone.  Patient was advised she may try taking half of one oxycodone if needed for abdominal pain.  Patient was advised to call/return or go directly to  the emergency department if she develops any worsening symptoms  whatsoever.   Breast cancer of upper-outer quadrant of right female breast Patient is status post bilateral mastectomy.  Patient continues to take Aromasin as previously directed.  Patient was taking oral Afinitor therapy until last week-when she was instructed to hold due to transaminitis.  Also, patient continues to receive Zometa infusions for her previously diagnosed bone metastasis on an every 12 week basis.  Her next Zometa infusion is scheduled for 12/27/2014.   Dysphagia Patient has a history of chronic dysphagia; and underwent a esophageal dilatation per [facility] on 03/30/2014.  Patient states that she is now once again having some mild difficulty swallowing specific foods and sometimes water as well.  Advised patient to follow-up with [facility] for further evaluation and management.   Hyperbilirubinemia Bilirubin has increased from 0.89 up to 1.4 to since last lab draw.  Patient is also complaining of increased abdominal discomfort and bloating.  Will obtain a liver MRI tomorrow for further evaluation and management.   Hyperphosphatemia Alkaline phosphatase also remains elevated at 585.  Most likely, this is secondary to elevated liver enzymes and increased abdominal discomfort and bloating.  Liver MRI pending for tomorrow.   Transaminitis AST continues to trend upward; with elevation from 233 up to 263 today.  Patient also complaining of increased abdominal discomfort and bloating; with possible ascites now.  Patient has been holding her oral Afinitor therapy since last week due to transaminitis.  Patient is scheduled for a liver MRI first thing in the morning; and plans to follow-up with Dr. [name] to review scan results tomorrow afternoon 12/20/2014.   Patient stated understanding of all instructions; and was in agreement with this plan of care. The patient knows to call the clinic with any problems, questions  or concerns.   Review/collaboration with Dr. [name] regarding all aspects of patient's visit today.   Total time spent with patient was 40 minutes;  with greater than 75 percent of that time spent in face to face counseling regarding patient's symptoms,  and coordination of care and follow up.  Disclaimer: This note was dictated with voice recognition software. Similar sounding words can inadvertently be transcribed and may not be corrected upon review.   Drue Second, NP 12/20/2014

## 2014-12-22 ENCOUNTER — Telehealth: Payer: Self-pay | Admitting: *Deleted

## 2014-12-22 ENCOUNTER — Other Ambulatory Visit: Payer: Medicaid Other

## 2014-12-22 NOTE — Telephone Encounter (Signed)
Per staff message and POF I have scheduled appts. Advised scheduler of appts. JMW  

## 2014-12-24 ENCOUNTER — Inpatient Hospital Stay (HOSPITAL_COMMUNITY)
Admission: EM | Admit: 2014-12-24 | Discharge: 2015-01-03 | DRG: 435 | Disposition: A | Discharge status: Hospice | Payer: Medicaid Other | Attending: Internal Medicine | Admitting: Internal Medicine

## 2014-12-24 ENCOUNTER — Encounter (HOSPITAL_COMMUNITY): Payer: Self-pay

## 2014-12-24 ENCOUNTER — Emergency Department (HOSPITAL_COMMUNITY): Payer: Medicaid Other

## 2014-12-24 DIAGNOSIS — R1310 Dysphagia, unspecified: Secondary | ICD-10-CM | POA: Diagnosis present

## 2014-12-24 DIAGNOSIS — C7960 Secondary malignant neoplasm of unspecified ovary: Secondary | ICD-10-CM | POA: Diagnosis present

## 2014-12-24 DIAGNOSIS — C7951 Secondary malignant neoplasm of bone: Secondary | ICD-10-CM | POA: Diagnosis present

## 2014-12-24 DIAGNOSIS — C7989 Secondary malignant neoplasm of other specified sites: Secondary | ICD-10-CM

## 2014-12-24 DIAGNOSIS — Z79899 Other long term (current) drug therapy: Secondary | ICD-10-CM

## 2014-12-24 DIAGNOSIS — G893 Neoplasm related pain (acute) (chronic): Secondary | ICD-10-CM | POA: Diagnosis present

## 2014-12-24 DIAGNOSIS — R945 Abnormal results of liver function studies: Secondary | ICD-10-CM

## 2014-12-24 DIAGNOSIS — G894 Chronic pain syndrome: Secondary | ICD-10-CM | POA: Diagnosis present

## 2014-12-24 DIAGNOSIS — I10 Essential (primary) hypertension: Secondary | ICD-10-CM | POA: Diagnosis present

## 2014-12-24 DIAGNOSIS — C50919 Malignant neoplasm of unspecified site of unspecified female breast: Secondary | ICD-10-CM | POA: Diagnosis present

## 2014-12-24 DIAGNOSIS — K5900 Constipation, unspecified: Secondary | ICD-10-CM | POA: Diagnosis present

## 2014-12-24 DIAGNOSIS — C228 Malignant neoplasm of liver, primary, unspecified as to type: Secondary | ICD-10-CM

## 2014-12-24 DIAGNOSIS — C229 Malignant neoplasm of liver, not specified as primary or secondary: Secondary | ICD-10-CM

## 2014-12-24 DIAGNOSIS — R112 Nausea with vomiting, unspecified: Secondary | ICD-10-CM | POA: Diagnosis present

## 2014-12-24 DIAGNOSIS — C50411 Malignant neoplasm of upper-outer quadrant of right female breast: Secondary | ICD-10-CM | POA: Diagnosis present

## 2014-12-24 DIAGNOSIS — K7460 Unspecified cirrhosis of liver: Secondary | ICD-10-CM

## 2014-12-24 DIAGNOSIS — D689 Coagulation defect, unspecified: Secondary | ICD-10-CM | POA: Diagnosis present

## 2014-12-24 DIAGNOSIS — Z9013 Acquired absence of bilateral breasts and nipples: Secondary | ICD-10-CM | POA: Diagnosis present

## 2014-12-24 DIAGNOSIS — R1110 Vomiting, unspecified: Secondary | ICD-10-CM

## 2014-12-24 DIAGNOSIS — C786 Secondary malignant neoplasm of retroperitoneum and peritoneum: Secondary | ICD-10-CM | POA: Diagnosis present

## 2014-12-24 DIAGNOSIS — K7682 Hepatic encephalopathy: Secondary | ICD-10-CM | POA: Insufficient documentation

## 2014-12-24 DIAGNOSIS — K7290 Hepatic failure, unspecified without coma: Secondary | ICD-10-CM | POA: Diagnosis present

## 2014-12-24 DIAGNOSIS — I959 Hypotension, unspecified: Secondary | ICD-10-CM | POA: Insufficient documentation

## 2014-12-24 DIAGNOSIS — C799 Secondary malignant neoplasm of unspecified site: Secondary | ICD-10-CM

## 2014-12-24 DIAGNOSIS — K767 Hepatorenal syndrome: Secondary | ICD-10-CM | POA: Diagnosis present

## 2014-12-24 DIAGNOSIS — R188 Other ascites: Secondary | ICD-10-CM

## 2014-12-24 DIAGNOSIS — K219 Gastro-esophageal reflux disease without esophagitis: Secondary | ICD-10-CM | POA: Diagnosis present

## 2014-12-24 DIAGNOSIS — R1084 Generalized abdominal pain: Secondary | ICD-10-CM

## 2014-12-24 DIAGNOSIS — Z823 Family history of stroke: Secondary | ICD-10-CM

## 2014-12-24 DIAGNOSIS — F329 Major depressive disorder, single episode, unspecified: Secondary | ICD-10-CM | POA: Diagnosis present

## 2014-12-24 DIAGNOSIS — F17210 Nicotine dependence, cigarettes, uncomplicated: Secondary | ICD-10-CM | POA: Diagnosis present

## 2014-12-24 DIAGNOSIS — N951 Menopausal and female climacteric states: Secondary | ICD-10-CM | POA: Diagnosis present

## 2014-12-24 DIAGNOSIS — Z833 Family history of diabetes mellitus: Secondary | ICD-10-CM

## 2014-12-24 DIAGNOSIS — C7982 Secondary malignant neoplasm of genital organs: Secondary | ICD-10-CM | POA: Diagnosis present

## 2014-12-24 DIAGNOSIS — Z66 Do not resuscitate: Secondary | ICD-10-CM | POA: Diagnosis present

## 2014-12-24 DIAGNOSIS — E785 Hyperlipidemia, unspecified: Secondary | ICD-10-CM | POA: Diagnosis present

## 2014-12-24 DIAGNOSIS — R410 Disorientation, unspecified: Secondary | ICD-10-CM

## 2014-12-24 DIAGNOSIS — Z7189 Other specified counseling: Secondary | ICD-10-CM | POA: Insufficient documentation

## 2014-12-24 DIAGNOSIS — E860 Dehydration: Secondary | ICD-10-CM | POA: Diagnosis present

## 2014-12-24 DIAGNOSIS — C787 Secondary malignant neoplasm of liver and intrahepatic bile duct: Principal | ICD-10-CM | POA: Diagnosis present

## 2014-12-24 DIAGNOSIS — N179 Acute kidney failure, unspecified: Secondary | ICD-10-CM | POA: Diagnosis present

## 2014-12-24 DIAGNOSIS — D696 Thrombocytopenia, unspecified: Secondary | ICD-10-CM | POA: Diagnosis present

## 2014-12-24 DIAGNOSIS — R180 Malignant ascites: Secondary | ICD-10-CM | POA: Diagnosis present

## 2014-12-24 DIAGNOSIS — Z8249 Family history of ischemic heart disease and other diseases of the circulatory system: Secondary | ICD-10-CM

## 2014-12-24 DIAGNOSIS — K209 Esophagitis, unspecified: Secondary | ICD-10-CM | POA: Diagnosis present

## 2014-12-24 DIAGNOSIS — R7989 Other specified abnormal findings of blood chemistry: Secondary | ICD-10-CM

## 2014-12-24 DIAGNOSIS — K7469 Other cirrhosis of liver: Secondary | ICD-10-CM | POA: Diagnosis present

## 2014-12-24 LAB — URINALYSIS, ROUTINE W REFLEX MICROSCOPIC
GLUCOSE, UA: NEGATIVE mg/dL
KETONES UR: NEGATIVE mg/dL
NITRITE: NEGATIVE
Protein, ur: NEGATIVE mg/dL
Specific Gravity, Urine: 1.018 (ref 1.005–1.030)
Urobilinogen, UA: 2 mg/dL — ABNORMAL HIGH (ref 0.0–1.0)
pH: 5.5 (ref 5.0–8.0)

## 2014-12-24 LAB — URINE MICROSCOPIC-ADD ON

## 2014-12-24 LAB — I-STAT CG4 LACTIC ACID, ED
Lactic Acid, Venous: 1.7 mmol/L (ref 0.5–2.0)
Lactic Acid, Venous: 1.29 mmol/L (ref 0.5–2.0)

## 2014-12-24 LAB — COMPREHENSIVE METABOLIC PANEL
ALK PHOS: 836 U/L — AB (ref 39–117)
ALT: 119 U/L — ABNORMAL HIGH (ref 0–35)
ANION GAP: 11 (ref 5–15)
AST: 397 U/L — ABNORMAL HIGH (ref 0–37)
Albumin: 3 g/dL — ABNORMAL LOW (ref 3.5–5.2)
BILIRUBIN TOTAL: 3.5 mg/dL — AB (ref 0.3–1.2)
BUN: 34 mg/dL — ABNORMAL HIGH (ref 6–23)
CALCIUM: 10.2 mg/dL (ref 8.4–10.5)
CHLORIDE: 103 mmol/L (ref 96–112)
CO2: 22 mmol/L (ref 19–32)
Creatinine, Ser: 1.1 mg/dL (ref 0.50–1.10)
GFR calc non Af Amer: 59 mL/min — ABNORMAL LOW (ref >90)
GFR, EST AFRICAN AMERICAN: 68 mL/min — AB (ref >90)
GLUCOSE: 114 mg/dL — AB (ref 70–99)
POTASSIUM: 3.7 mmol/L (ref 3.5–5.1)
SODIUM: 136 mmol/L (ref 135–145)
Total Protein: 6.6 g/dL (ref 6.0–8.3)

## 2014-12-24 LAB — CBC
HEMATOCRIT: 31.6 % — AB (ref 36.0–46.0)
HEMOGLOBIN: 10.3 g/dL — AB (ref 12.0–15.0)
MCH: 25.2 pg — ABNORMAL LOW (ref 26.0–34.0)
MCHC: 32.6 g/dL (ref 30.0–36.0)
MCV: 77.3 fL — ABNORMAL LOW (ref 78.0–100.0)
Platelets: 168 10*3/uL (ref 150–400)
RBC: 4.09 MIL/uL (ref 3.87–5.11)
RDW: 15.6 % — AB (ref 11.5–15.5)
WBC: 5.2 10*3/uL (ref 4.0–10.5)

## 2014-12-24 LAB — LIPASE, BLOOD: Lipase: 54 U/L (ref 11–59)

## 2014-12-24 MED ORDER — SODIUM CHLORIDE 0.9 % IV BOLUS (SEPSIS)
1000.0000 mL | Freq: Once | INTRAVENOUS | Status: AC
  Administered 2014-12-24: 1000 mL via INTRAVENOUS

## 2014-12-24 MED ORDER — PROMETHAZINE HCL 25 MG/ML IJ SOLN
25.0000 mg | Freq: Once | INTRAMUSCULAR | Status: AC
  Administered 2014-12-24: 25 mg via INTRAVENOUS
  Filled 2014-12-24: qty 1

## 2014-12-24 MED ORDER — IOHEXOL 300 MG/ML  SOLN
100.0000 mL | Freq: Once | INTRAMUSCULAR | Status: AC | PRN
  Administered 2014-12-24: 100 mL via INTRAVENOUS

## 2014-12-24 MED ORDER — HYDROMORPHONE HCL 1 MG/ML IJ SOLN
1.0000 mg | Freq: Once | INTRAMUSCULAR | Status: AC
  Administered 2014-12-24: 1 mg via INTRAVENOUS
  Filled 2014-12-24: qty 1

## 2014-12-24 NOTE — H&P (Signed)
PCP: Angelica Chessman, MD  Oncology: [facility]   Chief Complaint:  Abdominal pain nausea and vomiting  HPI: Tammy Blanchard is a 49 y.o. female   has a past medical history of Hypertension; Allergy; GERD (gastroesophageal reflux disease); Thyroid disease; Anemia; Hyperlipidemia; History of blood transfusion (2009); and Breast cancer (10/2009, 2014).   Presented with  Patient with stage IV breast cancer metastasized aches prepped pelvis, bones and liver and carcinomatosis  Patient is status post chemotherapy and lumpectomy. Patient is currently on oral chemotherapy. For the past 2 weeks she have been having worsening abdominal pain and generalized pain. Patient presents to ER with abdominal distention. Family states that patient became more confused after arrival to ER after getting IV dilaudid and phenergan. Patient reports being hypotensive last week down to 60/30. Currently BP 98/73. Denies any fever or chills. She have had somewhat unstable gait. CT abdomen worrisome for esophagitis and evidence of cirrhosis and Moderate volume ascites. She denies any sores in her mouth. States it hurts to swallow food.  Hospitalist was called for admission for Intractable Nausea vomiting and abdominal pain  Review of Systems:    Pertinent positives include: odynophagia,  abdominal pain, nausea, vomiting, constipation  Constitutional:  No weight loss, night sweats, Fevers, chills, fatigue, weight loss  HEENT:  No headaches, Difficulty swallowing,Tooth/dental problems,Sore throat,  No sneezing, itching, ear ache, nasal congestion, post nasal drip,  Cardio-vascular:  No chest pain, Orthopnea, PND, anasarca, dizziness, palpitations.no Bilateral lower extremity swelling  GI:  No heartburn, indigestion,diarrhea, change in bowel habits, loss of appetite, melena, blood in stool, hematemesis Resp:  no shortness of breath at rest. No dyspnea on exertion, No excess mucus, no productive cough, No  non-productive cough, No coughing up of blood.No change in color of mucus.No wheezing. Skin:  no rash or lesions. No jaundice GU:  no dysuria, change in color of urine, no urgency or frequency. No straining to urinate.  No flank pain.  Musculoskeletal:  No joint pain or no joint swelling. No decreased range of motion. No back pain.  Psych:  No change in mood or affect. No depression or anxiety. No memory loss.  Neuro: no localizing neurological complaints, no tingling, no weakness, no double vision, no gait abnormality, no slurred speech, no confusion  Otherwise ROS are negative except for above, 10 systems were reviewed  Past Medical History: Past Medical History  Diagnosis Date  . Hypertension   . Allergy   . GERD (gastroesophageal reflux disease)   . Thyroid disease   . Anemia     resolved 2011  . Hyperlipidemia     controlled  . History of blood transfusion 2009    [facility] -  UNKNOWN NUMBER OF UNITS TRANSFUSED  . Breast cancer 10/2009, 2014    ER+/PR+/Her2-   Past Surgical History  Procedure Laterality Date  . Cesarean section    . Wisdom tooth extraction    . Tubal ligation    . Breast surgery  10/2009    right lymp nodes removed  . Left foot surgery    . Abdominal hysterectomy  11/25/2012    Procedure: HYSTERECTOMY ABDOMINAL;  Surgeon: [name], MD;  Location: [facility];  Service: Gynecology;  Laterality: N/A;  with Bilateral Salpingoopherectomy and Cystoscopy  . Mastectomy complete / simple Bilateral 05/25/2013  . Mass biopsy  05/25/2013    on abdomen and right chest wall, and Right neck Archie Endo 05/25/2013  . Total mastectomy Bilateral 05/25/2013    Dr [name]   .  Total mastectomy Bilateral 05/25/2013    Procedure: Bilateral Total Mastectomy;  Surgeon: Merrie Roof, MD;  Location: [facility];  Service: General;  Laterality: Bilateral;  . Mass biopsy N/A 05/25/2013    Procedure: Biopsy  nodule on abdomen and right chest wall, and Right neck;  Surgeon: Merrie Roof,  MD;  Location: [facility];  Service: General;  Laterality: N/A;     Medications: Prior to Admission medications   Medication Sig Start Date End Date Taking? Authorizing Provider  oxyCODONE (OXY IR/ROXICODONE) 5 MG immediate release tablet Take 1 tablet (5 mg total) by mouth every 6 (six) hours as needed for severe pain. 12/19/14  Yes Drue Second, NP  exemestane (AROMASIN) 25 MG tablet Take 1 tablet (25 mg total) by mouth daily after breakfast. Patient not taking: Reported on 12/24/2014 11/20/14   Chauncey Cruel, MD  lidocaine-prilocaine (EMLA) cream Apply 1 application topically as needed. Apply over portacath  1 1/2 hours to 2 hours prior to procedures as needed. 02/22/14   Amy Milda Smart, PA-C  prochlorperazine (COMPAZINE) 10 MG tablet Take 10 mg by mouth every 6 (six) hours as needed for nausea or vomiting.    Historical Provider, MD  Zoledronic Acid (ZOMETA IV) Inject 4 mg into the vein every 6 (six) weeks.  03/17/13   Chauncey Cruel, MD    Allergies:   Allergies  Allergen Reactions  . Metronidazole Swelling  . Tegaderm Ag Mesh [Silver]     Please use op-site   . Tramadol Nausea Only    Social History:  Ambulatory  Independently  Lives at home alone,        reports that she has been smoking Cigarettes.  She has a 11.5 pack-year smoking history. She has never used smokeless tobacco. She reports that she drinks alcohol. She reports that she does not use illicit drugs.    Family History: family history includes Diabetes in her maternal aunt, maternal aunt, maternal grandmother, and mother; Heart disease in her maternal aunt and maternal grandmother; Hypertension in her maternal aunt, maternal aunt, maternal grandmother, and mother; Stroke in her maternal aunt and maternal grandmother. There is no history of Esophageal cancer, Stomach cancer, or Colon cancer.    Physical Exam: Patient Vitals for the past 24 hrs:  BP Temp Temp src Pulse Resp SpO2 Height Weight  12/24/14 2147 97/72  mmHg - - 107 16 100 % - -  12/24/14 1858 92/69 mmHg 98.9 F (37.2 C) Oral 110 16 100 % $Rem'5\' 3"'zBYT$  (1.6 m) 66.225 kg (146 lb)  12/24/14 1837 92/69 mmHg 98.5 F (36.9 C) Oral 117 18 100 % - -    1. General:  in No Acute distress 2. Psychological:lethargic but  Oriented 3. Head/ENT:    Dry Mucous Membranes                          Head Non traumatic, neck supple                          Normal  r Dentition 4. SKIN:   decreased Skin turgor,  Skin clean Dry and intact no rash 5. Heart: Regular rate and rhythm no Murmur, Rub or gallop 6. Lungs:   no wheezes or crackles   7. Abdomen: epigastric tenderness, some distention 8. Lower extremities: no clubbing, cyanosis, or edema 9. Neurologically drain 5 out of 5 in all 4 extremities cranial nerves  II through XII intact 10. MSK: Normal range of motion  body mass index is 25.87 kg/(m^2).   Labs on Admission:   Results for orders placed or performed during the hospital encounter of 12/24/14 (from the past 24 hour(s))  CBC     Status: Abnormal   Collection Time: 12/24/14  7:35 PM  Result Value Ref Range   WBC 5.2 4.0 - 10.5 K/uL   RBC 4.09 3.87 - 5.11 MIL/uL   Hemoglobin 10.3 (L) 12.0 - 15.0 g/dL   HCT 31.6 (L) 36.0 - 46.0 %   MCV 77.3 (L) 78.0 - 100.0 fL   MCH 25.2 (L) 26.0 - 34.0 pg   MCHC 32.6 30.0 - 36.0 g/dL   RDW 15.6 (H) 11.5 - 15.5 %   Platelets 168 150 - 400 K/uL  Comprehensive metabolic panel     Status: Abnormal   Collection Time: 12/24/14  7:35 PM  Result Value Ref Range   Sodium 136 135 - 145 mmol/L   Potassium 3.7 3.5 - 5.1 mmol/L   Chloride 103 96 - 112 mmol/L   CO2 22 19 - 32 mmol/L   Glucose, Bld 114 (H) 70 - 99 mg/dL   BUN 34 (H) 6 - 23 mg/dL   Creatinine, Ser 1.10 0.50 - 1.10 mg/dL   Calcium 10.2 8.4 - 10.5 mg/dL   Total Protein 6.6 6.0 - 8.3 g/dL   Albumin 3.0 (L) 3.5 - 5.2 g/dL   AST 397 (H) 0 - 37 U/L   ALT 119 (H) 0 - 35 U/L   Alkaline Phosphatase 836 (H) 39 - 117 U/L   Total Bilirubin 3.5 (H) 0.3 - 1.2 mg/dL    GFR calc non Af Amer 59 (L) >90 mL/min   GFR calc Af Amer 68 (L) >90 mL/min   Anion gap 11 5 - 15  Lipase, blood     Status: None   Collection Time: 12/24/14  7:35 PM  Result Value Ref Range   Lipase 54 11 - 59 U/L  Urinalysis, Routine w reflex microscopic     Status: Abnormal   Collection Time: 12/24/14  7:35 PM  Result Value Ref Range   Color, Urine ORANGE (A) YELLOW   APPearance CLOUDY (A) CLEAR   Specific Gravity, Urine 1.018 1.005 - 1.030   pH 5.5 5.0 - 8.0   Glucose, UA NEGATIVE NEGATIVE mg/dL   Hgb urine dipstick SMALL (A) NEGATIVE   Bilirubin Urine MODERATE (A) NEGATIVE   Ketones, ur NEGATIVE NEGATIVE mg/dL   Protein, ur NEGATIVE NEGATIVE mg/dL   Urobilinogen, UA 2.0 (H) 0.0 - 1.0 mg/dL   Nitrite NEGATIVE NEGATIVE   Leukocytes, UA TRACE (A) NEGATIVE  Urine microscopic-add on     Status: Abnormal   Collection Time: 12/24/14  7:35 PM  Result Value Ref Range   Squamous Epithelial / LPF MANY (A) RARE   WBC, UA 3-6 <3 WBC/hpf   RBC / HPF 3-6 <3 RBC/hpf   Bacteria, UA MANY (A) RARE  I-Stat CG4 Lactic Acid, ED     Status: None   Collection Time: 12/24/14  8:01 PM  Result Value Ref Range   Lactic Acid, Venous 1.70 0.5 - 2.0 mmol/L  I-Stat CG4 Lactic Acid, ED     Status: None   Collection Time: 12/24/14 10:40 PM  Result Value Ref Range   Lactic Acid, Venous 1.29 0.5 - 2.0 mmol/L    UA 3-6 WBC some bacteria  No results found for: HGBA1C  Estimated Creatinine Clearance: 57.8  mL/min (by C-G formula based on Cr of 1.1).  BNP (last 3 results) No results for input(s): PROBNP in the last 8760 hours.  Other results:  I have pearsonaly reviewed this: ECG REPORT no ischemic changes    Filed Weights   12/24/14 1858  Weight: 66.225 kg (146 lb)     Cultures:    Component Value Date/Time   SDES URINE, CLEAN CATCH 10/31/2013 0328   SPECREQUEST NONE 10/31/2013 0328   CULT  10/31/2013 0328    ESCHERICHIA COLI Performed at [facility] 11/02/2013  FINAL 10/31/2013 0328     Radiological Exams on Admission: Ct Abdomen Pelvis W Contrast  12/24/2014   EXAM: CT ABDOMEN AND PELVIS WITH CONTRAST  TECHNIQUE: Multidetector CT imaging of the abdomen and pelvis was performed using the standard protocol following bolus administration of intravenous contrast.  CONTRAST:  173mL OMNIPAQUE IOHEXOL 300 MG/ML  SOLN  COMPARISON:  Prior MRI from 12/20/2014 as well as CT from 11/08/2014.  FINDINGS: Small bilateral pleural effusions with associated bibasilar atelectasis is present. No pericardial effusion.  Previously identified macroscopic hepatic metastases are grossly stable relative to recent MRI. For reference purposes, the largest lesion in the right hepatic lobe measures 2.9 x 2.1 cm. Again, the liver demonstrates a diffusely heterogeneous appearance with nodular contour, suggestive of cirrhosis or possibly pseudo cirrhosis. These changes are grossly similar relative to recent MRI.  Cholelithiasis again noted within the gallbladder lumen. No biliary dilatation. Spleen within normal limits. Adrenal glands and pancreas are normal.  1.7 cm cyst present within the left kidney. No nephrolithiasis, hydronephrosis, or focal enhancing renal mass.  Mild circumferential wall thickening noted within the partially visualized distal esophagus, most commonly related to reflex. Stomach otherwise unremarkable. No evidence for bowel obstruction. No definite acute inflammatory changes seen about the bowels.  Mild circumferential bladder wall thickening. 7 mm hyperdensity at the posterior aspect of the bladder lumen is stable from prior study.  Uterus and ovaries are not visualized.  Moderate volume ascites again seen within the abdomen and pelvis. No frank peritoneal implant or omental caking identified.  No free air.  No pathologically enlarged intra-abdominal pelvic lymph nodes identified.  Widespread diffuse osseous metastases again seen. No pathologic fracture.  IMPRESSION: 1.  Stable appearance of macroscopic intrahepatic metastases as compared to recent MRI from 12/20/2014. 2. Diffusely heterogeneous and nodular contour of the liver, again suggestive of cirrhosis or possibly pseudo cirrhosis. These findings are stable relative to recent MRI. 3. Moderate volume ascites within the abdomen and pelvis. 4. Small bilateral pleural effusions with associated bibasilar atelectasis. 5. Diffuse esophageal wall thickening within the distal esophagus. Finding may be related to post radiation changes or possibly reflux. 6. Widespread diffuse osseous metastatic disease without pathologic fracture. 7. Cholelithiasis.   Electronically Signed   By: Jeannine Boga M.D.   On: 12/24/2014 22:53    Chart has been reviewed  Assessment/Plan 49 yo F with hx of widely metastatic breast Cancer here with Nausea vomiting abdominal pain and evidence of esophagitis  Present on Admission:  . Liver metastases - stable from prior but likely contributing to pain will treat symptomatically  . Essential hypertension - chronic currently hypotensive . Breast carcinoma metastatic  to multiple sites  - will need to discuss overall prognosis and plan of care with oncology in AM . Intractable nausea and vomiting - CT with out evidence of obstruction chronic cholelithiasis, patient have been constipated likely contributing. Given unstable gait and hx of Metastatic  breast cancer will benefit from brain imaging. For now start with CT head.  if no improvement - MRI of the brain  . Cancer associated pain - Will control with IV pain medications while having vomiting, she will likely benefit from long lasting meds once able to establish pain control  Possible esophagitis - supportive management, Protonix if no improvement Gi consult Abdominal pain multifactorial  - combination of abdominal distention, metastatic disease and ascites. NO fever, WBC wnl. SBO less likely if ascites worsens will benefit from LVP.    Prophylaxis: SCD  , Protonix  CODE STATUS:  FULL CODE as per patient  Other plan as per orders.  I have spent a total of 55 min on this admission  Roshaunda Starkey 12/24/2014, 11:17 PM  [facility]  Pager [phone]   after 2 AM please page floor coverage PA If 7AM-7PM, please contact the day team taking care of the patient  [email or web address]  Password [email or web address]

## 2014-12-24 NOTE — ED Notes (Signed)
Pt is drowsy but opening her eyes.

## 2014-12-24 NOTE — ED Notes (Signed)
PT HAS PORT RIGHT SIDE

## 2014-12-24 NOTE — ED Notes (Signed)
Per Stevie Kern she will get labs

## 2014-12-24 NOTE — ED Notes (Signed)
Pt returned from CT °

## 2014-12-24 NOTE — ED Provider Notes (Signed)
CSN: [id number]     Arrival date & time 12/24/14  1828 History   First MD Initiated Contact with Patient 12/24/14 1833     No chief complaint on file.    (Consider location/radiation/quality/duration/timing/severity/associated sxs/prior Treatment) HPI Comments: 49 year old female presenting with abdominal pain 1 week, worsening today. Pain located in the upper quadrants of her abdomen, described as sharp, radiating across. Currently 6/10, increasing at random. Nothing in specific makes her pain worse.Reports her abdomen appears distended. Admits to associated nausea and vomiting, which initially was intermittent throughout the week, however today became more frequent, bilious, no hematemesis. States she feels constipated, she had a bowel movement today, however it was difficult and hard. She is on narcotic pain medication at home for breast cancer, has been taking MiraLAX which is not helping. She saw her oncologist Dr. [name] 5 days ago who had patient get an MRI of her liver which the results  Stating cannot exclude recurrent metastatic disease. Admits to low-grade fevers of 99.9. Denies shortness of breath or cough. Oncologist took patient off her blood pressure medications 5 days ago since her blood pressure was in the 60s. 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 Reports she is currently only on one chemotherapy drug which she takes daily.  The history is provided by the patient and medical records.    Past Medical History  Diagnosis Date  . Hypertension   . Allergy   . GERD (gastroesophageal reflux disease)   . Thyroid disease   . Anemia     resolved 2011  . Hyperlipidemia     controlled  . History of blood transfusion 2009    [facility] -  UNKNOWN NUMBER OF UNITS TRANSFUSED  . Breast cancer 10/2009, 2014    ER+/PR+/Her2-   Past Surgical History  Procedure Laterality Date  . Cesarean section    . Wisdom  tooth extraction    . Tubal ligation    . Breast surgery  10/2009    right lymp nodes removed  . Left foot surgery    . Abdominal hysterectomy  11/25/2012    Procedure: HYSTERECTOMY ABDOMINAL;  Surgeon: [name], MD;  Location: [facility];  Service: Gynecology;  Laterality: N/A;  with Bilateral Salpingoopherectomy and Cystoscopy  . Mastectomy complete / simple Bilateral 05/25/2013  . Mass biopsy  05/25/2013    on abdomen and right chest wall, and Right neck Archie Endo 05/25/2013  . Total mastectomy Bilateral 05/25/2013    Dr [name]   . Total mastectomy Bilateral 05/25/2013    Procedure: Bilateral Total Mastectomy;  Surgeon: Merrie Roof, MD;  Location: [facility];  Service: General;  Laterality: Bilateral;  . Mass biopsy N/A 05/25/2013    Procedure: Biopsy  nodule on abdomen and right chest wall, and Right neck;  Surgeon: Merrie Roof, MD;  Location: [facility];  Service: General;  Laterality: N/A;   Family History  Problem Relation Age of Onset  . Diabetes Mother   . Hypertension Mother   . Diabetes Maternal Aunt   . Heart disease Maternal Aunt   . Hypertension Maternal Aunt   . Stroke Maternal Aunt   . Diabetes Maternal Grandmother   . Heart disease Maternal Grandmother   . Hypertension Maternal Grandmother   . Stroke Maternal Grandmother   . Diabetes Maternal Aunt   . Hypertension Maternal Aunt   . Esophageal cancer Neg Hx   . Stomach cancer Neg Hx   .  Colon cancer Neg Hx    History  Substance Use Topics  . Smoking status: Current Some Day Smoker -- 0.50 packs/day for 23 years    Types: Cigarettes    Last Attempt to Quit: 11/25/2012  . Smokeless tobacco: Never Used  . Alcohol Use: Yes     Comment: social   OB History    Gravida Para Term Preterm AB TAB SAB Ectopic Multiple Living   '4 4 3 1      4     ' Review of Systems  Constitutional: Positive for fever and chills.  Gastrointestinal: Positive for nausea, vomiting, abdominal pain and constipation.  All other systems  reviewed and are negative.     Allergies  Metronidazole; Tegaderm ag mesh; and Tramadol  Home Medications   Prior to Admission medications   Medication Sig Start Date End Date Taking? Authorizing Provider  oxyCODONE (OXY IR/ROXICODONE) 5 MG immediate release tablet Take 1 tablet (5 mg total) by mouth every 6 (six) hours as needed for severe pain. 12/19/14  Yes Drue Second, NP  exemestane (AROMASIN) 25 MG tablet Take 1 tablet (25 mg total) by mouth daily after breakfast. Patient not taking: Reported on 12/24/2014 11/20/14   Chauncey Cruel, MD  lidocaine-prilocaine (EMLA) cream Apply 1 application topically as needed. Apply over portacath  1 1/2 hours to 2 hours prior to procedures as needed. 02/22/14   Amy Milda Smart, PA-C  prochlorperazine (COMPAZINE) 10 MG tablet Take 10 mg by mouth every 6 (six) hours as needed for nausea or vomiting.    Historical Provider, MD  Zoledronic Acid (ZOMETA IV) Inject 4 mg into the vein every 6 (six) weeks.  03/17/13   Chauncey Cruel, MD   BP 108/68 mmHg  Pulse 95  Temp(Src) 98.4 F (36.9 C) (Oral)  Resp 19  Ht '5\' 3"'  (1.6 m)  Wt 146 lb (66.225 kg)  BMI 25.87 kg/m2  SpO2 100%  LMP 11/12/2012 Physical Exam  Constitutional: She is oriented to person, place, and time. She appears well-developed and well-nourished. No distress.  Chronically ill appearing.  HENT:  Head: Normocephalic and atraumatic.  Mouth/Throat: Oropharynx is clear and moist.  Eyes: Conjunctivae and EOM are normal.  Neck: Normal range of motion. Neck supple.  Cardiovascular: Regular rhythm and normal heart sounds.  Tachycardia present.   Pulmonary/Chest: Effort normal and breath sounds normal. No respiratory distress.  Abdominal: Bowel sounds are normal. She exhibits distension. There is hepatomegaly.  Generalized abdominal tenderness, worse upper quadrants bilateral. No peritoneal signs.  Musculoskeletal: Normal range of motion. She exhibits no edema.  Neurological: She is alert  and oriented to person, place, and time. No sensory deficit.  Skin: Skin is warm and dry.  Psychiatric: She has a normal mood and affect. Her behavior is normal.  Nursing note and vitals reviewed.   ED Course  Procedures (including critical care time) Labs Review Labs Reviewed  CBC - Abnormal; Notable for the following:    Hemoglobin 10.3 (*)    HCT 31.6 (*)    MCV 77.3 (*)    MCH 25.2 (*)    RDW 15.6 (*)    All other components within normal limits  COMPREHENSIVE METABOLIC PANEL - Abnormal; Notable for the following:    Glucose, Bld 114 (*)    BUN 34 (*)    Albumin 3.0 (*)    AST 397 (*)    ALT 119 (*)    Alkaline Phosphatase 836 (*)    Total Bilirubin 3.5 (*)  GFR calc non Af Amer 59 (*)    GFR calc Af Amer 68 (*)    All other components within normal limits  URINALYSIS, ROUTINE W REFLEX MICROSCOPIC - Abnormal; Notable for the following:    Color, Urine ORANGE (*)    APPearance CLOUDY (*)    Hgb urine dipstick SMALL (*)    Bilirubin Urine MODERATE (*)    Urobilinogen, UA 2.0 (*)    Leukocytes, UA TRACE (*)    All other components within normal limits  URINE MICROSCOPIC-ADD ON - Abnormal; Notable for the following:    Squamous Epithelial / LPF MANY (*)    Bacteria, UA MANY (*)    All other components within normal limits  LIPASE, BLOOD  I-STAT CG4 LACTIC ACID, ED  I-STAT CG4 LACTIC ACID, ED    Imaging Review Ct Abdomen Pelvis W Contrast  12/24/2014   EXAM: CT ABDOMEN AND PELVIS WITH CONTRAST  TECHNIQUE: Multidetector CT imaging of the abdomen and pelvis was performed using the standard protocol following bolus administration of intravenous contrast.  CONTRAST:  167m OMNIPAQUE IOHEXOL 300 MG/ML  SOLN  COMPARISON:  Prior MRI from 12/20/2014 as well as CT from 11/08/2014.  FINDINGS: Small bilateral pleural effusions with associated bibasilar atelectasis is present. No pericardial effusion.  Previously identified macroscopic hepatic metastases are grossly stable  relative to recent MRI. For reference purposes, the largest lesion in the right hepatic lobe measures 2.9 x 2.1 cm. Again, the liver demonstrates a diffusely heterogeneous appearance with nodular contour, suggestive of cirrhosis or possibly pseudo cirrhosis. These changes are grossly similar relative to recent MRI.  Cholelithiasis again noted within the gallbladder lumen. No biliary dilatation. Spleen within normal limits. Adrenal glands and pancreas are normal.  1.7 cm cyst present within the left kidney. No nephrolithiasis, hydronephrosis, or focal enhancing renal mass.  Mild circumferential wall thickening noted within the partially visualized distal esophagus, most commonly related to reflex. Stomach otherwise unremarkable. No evidence for bowel obstruction. No definite acute inflammatory changes seen about the bowels.  Mild circumferential bladder wall thickening. 7 mm hyperdensity at the posterior aspect of the bladder lumen is stable from prior study.  Uterus and ovaries are not visualized.  Moderate volume ascites again seen within the abdomen and pelvis. No frank peritoneal implant or omental caking identified.  No free air.  No pathologically enlarged intra-abdominal pelvic lymph nodes identified.  Widespread diffuse osseous metastases again seen. No pathologic fracture.  IMPRESSION: 1. Stable appearance of macroscopic intrahepatic metastases as compared to recent MRI from 12/20/2014. 2. Diffusely heterogeneous and nodular contour of the liver, again suggestive of cirrhosis or possibly pseudo cirrhosis. These findings are stable relative to recent MRI. 3. Moderate volume ascites within the abdomen and pelvis. 4. Small bilateral pleural effusions with associated bibasilar atelectasis. 5. Diffuse esophageal wall thickening within the distal esophagus. Finding may be related to post radiation changes or possibly reflux. 6. Widespread diffuse osseous metastatic disease without pathologic fracture. 7.  Cholelithiasis.   Electronically Signed   By: BJeannine BogaM.D.   On: 12/24/2014 22:53     EKG Interpretation None      MDM   Final diagnoses:  Ascites  Non-intractable vomiting with nausea, vomiting of unspecified type  Abdominal pain, generalized  Hypotension, unspecified hypotension type   Chronically ill appearing but in NAD. Low-grade fever. Tachycardic, hypotensive. Worsening abdominal pain, n/v. Abdomen distended. CT obtained for further evaluation of increased pain and distension. CT results as stated above. Labs with worsening alk-phos,  otherwise at baseline. Received IV fluids with improvement of vital signs. Pt will be admitted for pain and n/v control. Admission accepted by Dr. Roel Cluck, [facility].  Discussed with attending Dr. Aline Brochure who agrees with plan of care.   Carman Ching, PA-C 12/25/14 8118  Pamella Pert, MD 01/02/15 1045

## 2014-12-24 NOTE — ED Notes (Signed)
Pt being transported to CT

## 2014-12-25 ENCOUNTER — Inpatient Hospital Stay (HOSPITAL_COMMUNITY): Payer: Medicaid Other

## 2014-12-25 ENCOUNTER — Other Ambulatory Visit: Payer: Self-pay

## 2014-12-25 ENCOUNTER — Other Ambulatory Visit: Payer: Self-pay | Admitting: *Deleted

## 2014-12-25 DIAGNOSIS — C7960 Secondary malignant neoplasm of unspecified ovary: Secondary | ICD-10-CM | POA: Diagnosis present

## 2014-12-25 DIAGNOSIS — G894 Chronic pain syndrome: Secondary | ICD-10-CM | POA: Diagnosis present

## 2014-12-25 DIAGNOSIS — C786 Secondary malignant neoplasm of retroperitoneum and peritoneum: Secondary | ICD-10-CM | POA: Diagnosis present

## 2014-12-25 DIAGNOSIS — R1084 Generalized abdominal pain: Secondary | ICD-10-CM | POA: Insufficient documentation

## 2014-12-25 DIAGNOSIS — Z8249 Family history of ischemic heart disease and other diseases of the circulatory system: Secondary | ICD-10-CM | POA: Diagnosis not present

## 2014-12-25 DIAGNOSIS — C7951 Secondary malignant neoplasm of bone: Secondary | ICD-10-CM | POA: Diagnosis present

## 2014-12-25 DIAGNOSIS — G893 Neoplasm related pain (acute) (chronic): Secondary | ICD-10-CM | POA: Diagnosis present

## 2014-12-25 DIAGNOSIS — Z823 Family history of stroke: Secondary | ICD-10-CM | POA: Diagnosis not present

## 2014-12-25 DIAGNOSIS — C787 Secondary malignant neoplasm of liver and intrahepatic bile duct: Secondary | ICD-10-CM | POA: Diagnosis present

## 2014-12-25 DIAGNOSIS — D696 Thrombocytopenia, unspecified: Secondary | ICD-10-CM | POA: Diagnosis present

## 2014-12-25 DIAGNOSIS — C7982 Secondary malignant neoplasm of genital organs: Secondary | ICD-10-CM | POA: Diagnosis present

## 2014-12-25 DIAGNOSIS — D689 Coagulation defect, unspecified: Secondary | ICD-10-CM | POA: Diagnosis present

## 2014-12-25 DIAGNOSIS — E860 Dehydration: Secondary | ICD-10-CM | POA: Insufficient documentation

## 2014-12-25 DIAGNOSIS — K209 Esophagitis, unspecified: Secondary | ICD-10-CM | POA: Diagnosis present

## 2014-12-25 DIAGNOSIS — C50411 Malignant neoplasm of upper-outer quadrant of right female breast: Secondary | ICD-10-CM | POA: Diagnosis present

## 2014-12-25 DIAGNOSIS — Z66 Do not resuscitate: Secondary | ICD-10-CM | POA: Diagnosis present

## 2014-12-25 DIAGNOSIS — K7469 Other cirrhosis of liver: Secondary | ICD-10-CM | POA: Diagnosis present

## 2014-12-25 DIAGNOSIS — E785 Hyperlipidemia, unspecified: Secondary | ICD-10-CM | POA: Diagnosis present

## 2014-12-25 DIAGNOSIS — N951 Menopausal and female climacteric states: Secondary | ICD-10-CM | POA: Diagnosis present

## 2014-12-25 DIAGNOSIS — K7290 Hepatic failure, unspecified without coma: Secondary | ICD-10-CM | POA: Diagnosis present

## 2014-12-25 DIAGNOSIS — Z79899 Other long term (current) drug therapy: Secondary | ICD-10-CM | POA: Diagnosis not present

## 2014-12-25 DIAGNOSIS — F329 Major depressive disorder, single episode, unspecified: Secondary | ICD-10-CM | POA: Diagnosis present

## 2014-12-25 DIAGNOSIS — I959 Hypotension, unspecified: Secondary | ICD-10-CM | POA: Insufficient documentation

## 2014-12-25 DIAGNOSIS — I10 Essential (primary) hypertension: Secondary | ICD-10-CM | POA: Diagnosis present

## 2014-12-25 DIAGNOSIS — R180 Malignant ascites: Secondary | ICD-10-CM | POA: Diagnosis present

## 2014-12-25 DIAGNOSIS — K219 Gastro-esophageal reflux disease without esophagitis: Secondary | ICD-10-CM | POA: Diagnosis present

## 2014-12-25 DIAGNOSIS — Z9013 Acquired absence of bilateral breasts and nipples: Secondary | ICD-10-CM | POA: Diagnosis present

## 2014-12-25 DIAGNOSIS — Z833 Family history of diabetes mellitus: Secondary | ICD-10-CM | POA: Diagnosis not present

## 2014-12-25 DIAGNOSIS — K767 Hepatorenal syndrome: Secondary | ICD-10-CM | POA: Diagnosis present

## 2014-12-25 DIAGNOSIS — N179 Acute kidney failure, unspecified: Secondary | ICD-10-CM | POA: Diagnosis present

## 2014-12-25 DIAGNOSIS — R1310 Dysphagia, unspecified: Secondary | ICD-10-CM | POA: Diagnosis present

## 2014-12-25 DIAGNOSIS — K5900 Constipation, unspecified: Secondary | ICD-10-CM | POA: Diagnosis present

## 2014-12-25 DIAGNOSIS — F17210 Nicotine dependence, cigarettes, uncomplicated: Secondary | ICD-10-CM | POA: Diagnosis present

## 2014-12-25 LAB — COMPREHENSIVE METABOLIC PANEL
ALT: 105 U/L — AB (ref 0–35)
AST: 337 U/L — AB (ref 0–37)
Albumin: 2.6 g/dL — ABNORMAL LOW (ref 3.5–5.2)
Alkaline Phosphatase: 731 U/L — ABNORMAL HIGH (ref 39–117)
Anion gap: 6 (ref 5–15)
BUN: 32 mg/dL — ABNORMAL HIGH (ref 6–23)
CALCIUM: 8.9 mg/dL (ref 8.4–10.5)
CO2: 21 mmol/L (ref 19–32)
Chloride: 110 mmol/L (ref 96–112)
Creatinine, Ser: 1.01 mg/dL (ref 0.50–1.10)
GFR calc non Af Amer: 65 mL/min — ABNORMAL LOW (ref >90)
GFR, EST AFRICAN AMERICAN: 76 mL/min — AB (ref >90)
Glucose, Bld: 104 mg/dL — ABNORMAL HIGH (ref 70–99)
POTASSIUM: 3.9 mmol/L (ref 3.5–5.1)
Sodium: 137 mmol/L (ref 135–145)
TOTAL PROTEIN: 6 g/dL (ref 6.0–8.3)
Total Bilirubin: 3.4 mg/dL — ABNORMAL HIGH (ref 0.3–1.2)

## 2014-12-25 LAB — PROTIME-INR
INR: 1.09 (ref 0.00–1.49)
PROTHROMBIN TIME: 14.3 s (ref 11.6–15.2)

## 2014-12-25 LAB — MAGNESIUM: MAGNESIUM: 2.1 mg/dL (ref 1.5–2.5)

## 2014-12-25 LAB — CBC
HCT: 32.2 % — ABNORMAL LOW (ref 36.0–46.0)
HEMOGLOBIN: 10.4 g/dL — AB (ref 12.0–15.0)
MCH: 25.3 pg — AB (ref 26.0–34.0)
MCHC: 32.3 g/dL (ref 30.0–36.0)
MCV: 78.3 fL (ref 78.0–100.0)
Platelets: 146 10*3/uL — ABNORMAL LOW (ref 150–400)
RBC: 4.11 MIL/uL (ref 3.87–5.11)
RDW: 15.8 % — ABNORMAL HIGH (ref 11.5–15.5)
WBC: 3.8 10*3/uL — ABNORMAL LOW (ref 4.0–10.5)

## 2014-12-25 LAB — PHOSPHORUS: PHOSPHORUS: 3.8 mg/dL (ref 2.3–4.6)

## 2014-12-25 LAB — TSH: TSH: 2.105 u[IU]/mL (ref 0.350–4.500)

## 2014-12-25 LAB — AMMONIA: Ammonia: 76 umol/L — ABNORMAL HIGH (ref 11–32)

## 2014-12-25 MED ORDER — BISACODYL 10 MG RE SUPP: 10.0000 mg | Freq: Every day | RECTAL | Status: DC | PRN

## 2014-12-25 MED ORDER — SUCRALFATE 1 GM/10ML PO SUSP
1.0000 g | Freq: Three times a day (TID) | ORAL | Status: DC
  Administered 2014-12-25 – 2015-01-02 (×28): 1 g via ORAL
  Filled 2014-12-25 (×34): qty 10

## 2014-12-25 MED ORDER — ONDANSETRON HCL 4 MG/2ML IJ SOLN
4.0000 mg | Freq: Four times a day (QID) | INTRAMUSCULAR | Status: DC | PRN
Start: 2014-12-25 — End: 2015-01-03
  Administered 2014-12-26: 4 mg via INTRAVENOUS
  Filled 2014-12-25: qty 2

## 2014-12-25 MED ORDER — SENNA 8.6 MG PO TABS
1.0000 | ORAL_TABLET | Freq: Two times a day (BID) | ORAL | Status: DC
  Administered 2014-12-25 – 2014-12-29 (×8): 8.6 mg via ORAL
  Filled 2014-12-25 (×10): qty 1

## 2014-12-25 MED ORDER — SODIUM CHLORIDE 0.9 % IV SOLN
INTRAVENOUS | Status: AC
  Administered 2014-12-25 – 2014-12-26 (×2): via INTRAVENOUS

## 2014-12-25 MED ORDER — SODIUM CHLORIDE 0.9 % IJ SOLN
10.0000 mL | INTRAMUSCULAR | Status: DC | PRN
  Administered 2014-12-25: 20 mL
  Administered 2014-12-26 – 2015-01-03 (×7): 10 mL
  Filled 2014-12-25 (×8): qty 40

## 2014-12-25 MED ORDER — ACETAMINOPHEN 650 MG RE SUPP: 650.0000 mg | Freq: Four times a day (QID) | RECTAL | Status: DC | PRN

## 2014-12-25 MED ORDER — NICOTINE 7 MG/24HR TD PT24
7.0000 mg | MEDICATED_PATCH | Freq: Every day | TRANSDERMAL | Status: DC
  Administered 2014-12-25 – 2015-01-02 (×8): 7 mg via TRANSDERMAL
  Filled 2014-12-25 (×14): qty 1

## 2014-12-25 MED ORDER — FLEET ENEMA 7-19 GM/118ML RE ENEM: 1.0000 | ENEMA | Freq: Once | RECTAL | Status: AC | PRN

## 2014-12-25 MED ORDER — SODIUM CHLORIDE 0.9 % IJ SOLN
3.0000 mL | Freq: Two times a day (BID) | INTRAMUSCULAR | Status: DC
  Administered 2014-12-25 – 2015-01-01 (×2): 3 mL via INTRAVENOUS

## 2014-12-25 MED ORDER — SODIUM CHLORIDE 0.9 % IV SOLN
INTRAVENOUS | Status: AC
  Administered 2014-12-25 (×2): via INTRAVENOUS

## 2014-12-25 MED ORDER — OXYCODONE HCL 5 MG PO TABS
10.0000 mg | ORAL_TABLET | Freq: Four times a day (QID) | ORAL | Status: DC | PRN
  Administered 2014-12-25 – 2015-01-02 (×5): 10 mg via ORAL
  Filled 2014-12-25 (×7): qty 2

## 2014-12-25 MED ORDER — POLYETHYLENE GLYCOL 3350 17 G PO PACK: 17.0000 g | PACK | Freq: Every day | ORAL | Status: DC | PRN

## 2014-12-25 MED ORDER — ACETAMINOPHEN 325 MG PO TABS
650.0000 mg | ORAL_TABLET | Freq: Four times a day (QID) | ORAL | Status: DC | PRN
  Filled 2014-12-25: qty 2

## 2014-12-25 MED ORDER — PANTOPRAZOLE SODIUM 40 MG IV SOLR
40.0000 mg | Freq: Every day | INTRAVENOUS | Status: DC
  Administered 2014-12-25 – 2014-12-29 (×6): 40 mg via INTRAVENOUS
  Filled 2014-12-25 (×6): qty 40

## 2014-12-25 MED ORDER — ENOXAPARIN SODIUM 40 MG/0.4ML ~~LOC~~ SOLN
40.0000 mg | SUBCUTANEOUS | Status: DC
  Filled 2014-12-25 (×4): qty 0.4

## 2014-12-25 MED ORDER — GI COCKTAIL ~~LOC~~: 30.0000 mL | Freq: Two times a day (BID) | ORAL | Status: DC | PRN

## 2014-12-25 MED ORDER — PROCHLORPERAZINE MALEATE 10 MG PO TABS: 10.0000 mg | ORAL_TABLET | Freq: Four times a day (QID) | ORAL | Status: DC | PRN

## 2014-12-25 MED ORDER — INFLUENZA VAC SPLIT QUAD 0.5 ML IM SUSY
0.5000 mL | PREFILLED_SYRINGE | INTRAMUSCULAR | Status: AC
  Administered 2014-12-26: 0.5 mL via INTRAMUSCULAR
  Filled 2014-12-25 (×2): qty 0.5

## 2014-12-25 MED ORDER — HYDROMORPHONE HCL 1 MG/ML IJ SOLN
0.5000 mg | INTRAMUSCULAR | Status: DC | PRN
  Administered 2014-12-26 – 2015-01-03 (×5): 1 mg via INTRAVENOUS
  Filled 2014-12-25 (×6): qty 1

## 2014-12-25 MED ORDER — DOCUSATE SODIUM 100 MG PO CAPS
100.0000 mg | ORAL_CAPSULE | Freq: Two times a day (BID) | ORAL | Status: DC
  Administered 2014-12-25 – 2014-12-29 (×7): 100 mg via ORAL
  Filled 2014-12-25 (×9): qty 1

## 2014-12-25 MED ORDER — ONDANSETRON HCL 4 MG PO TABS: 4.0000 mg | ORAL_TABLET | Freq: Four times a day (QID) | ORAL | Status: DC | PRN

## 2014-12-25 NOTE — Plan of Care (Signed)
Problem: Phase I Progression Outcomes Goal: Absence of IV chemotherapy infiltration Outcome: Not Applicable Date Met:  63/33/54 On oral chemo

## 2014-12-25 NOTE — Progress Notes (Signed)
[facility] PROGRESS NOTE  Tammy Blanchard YIA:[id number] DOB: [birth year] DOA: 12/24/2014 PCP: Angelica Chessman, MD  Assessment/Plan:  Intractable nausea, vomiting, and abdominal pain - CT without evidence of obstruction.  Abdominal CT also showed cholelithiasis, however if patient still having ongoing symptoms may need to be reevaluated to determine if she would benefit from cholecystectomy.  CT of abdomen and pelvis also showed moderate volume ascites, if ongoing symptoms may benefit from paracentesis. - Supportive management with IV fluids and antiemetics. - Concern that patient's metastatic disease may be contributing to her abdominal pain, nausea, vomiting.  Acute encephalopathy - Multifactorial, may be due to dehydration.  Head CT showed unchanged appearance of chronic white matter encephalomalacia, likely due to ischemia with diffuse bone metastasis.  Elevated liver function tests - CT shows findings suggestive of cirrhosis.  Unclear etiology.  Discussed with Dr. [name], oncology, who thought it may have been due to patient's chemotherapy regimen which has been held.  Continue to monitor liver function tests.  If liver function tests rise, may need biopsy to evaluate for possible metastatic disease.  Dehydration - Due to above, nausea and vomiting.  Hydrate the patient on IV fluids.  Stage IV breast cancer with widespread metastatic disease - Management as per oncology, Dr. [name]. - Discussed with Dr. [name], who will evaluate the patient later today.  Essential hypertension  - Hypertension improved with IV fluids.  Patient does not appear to be on any antihypertensive medications at home.    Possible esophagitis  - Supportive management, patient started on IV Protonix.  Chronic pain syndrome/cancer associated pain  - IV pain medications as needed.    Prophylaxis - Lovenox, Protonix  Code Status: Full code. Family Communication: Family at bedside.   Disposition Plan: Pending   Consultants:  Oncology - Dr. [name]  Procedures:  Head CT - 12/25/2014  CT abdomen and pelvis with contrast - 12/24/2014  Antibiotics:  None  HPI/Subjective: Patient complaining of generalized abdominal distention and discomfort.  Still has nausea, but her vomiting is improved.  Objective: Filed Vitals:   12/24/14 2330 12/25/14 0101 12/25/14 0117 12/25/14 0621  BP: 108/68 96/60 99/70  104/59  Pulse: 95 101 98 100  Temp: 98.4 F (36.9 C) 98.2 F (36.8 C) 98.7 F (37.1 C) 98.9 F (37.2 C)  TempSrc: Oral Oral Oral Oral  Resp: 19 20 20 20   Height:   5\' 3"  (1.6 m)   Weight:   66.8 kg (147 lb 4.3 oz)   SpO2: 100% 94% 100% 99%    Intake/Output Summary (Last 24 hours) at 12/25/14 1137 Last data filed at 12/25/14 0700  Gross per 24 hour  Intake 418.75 ml  Output    250 ml  Net 168.75 ml   Filed Weights   12/24/14 1858 12/25/14 0117  Weight: 66.225 kg (146 lb) 66.8 kg (147 lb 4.3 oz)    Exam:  Physical Exam: General: Awake, Oriented, No acute distress. HEENT: EOMI. Neck: Supple CV: S1 and S2 Lungs: Clear to ascultation bilaterally Abdomen: Soft, generalized tenderness, mild distention, +bowel sounds. Ext: Good pulses. Trace edema.  Data Reviewed: Basic Metabolic Panel:  Recent Labs Lab 12/19/14 1410 12/24/14 1935 12/25/14 0230  NA 139 136 137  K 3.8 3.7 3.9  CL  --  103 110  CO2 20* 22 21  GLUCOSE 99 114* 104*  BUN 12.6 34* 32*  CREATININE 0.8 1.10 1.01  CALCIUM 9.6 10.2 8.9  MG  --   --  2.1  PHOS  --   --  3.8   Liver Function Tests:  Recent Labs Lab 12/19/14 1410 12/24/14 1935 12/25/14 0230  AST 263* 397* 337*  ALT 127* 119* 105*  ALKPHOS 585* 836* 731*  BILITOT 1.42* 3.5* 3.4*  PROT 6.8 6.6 6.0  ALBUMIN 3.1* 3.0* 2.6*    Recent Labs Lab 12/24/14 1935  LIPASE 54    Recent Labs Lab 12/25/14 0016  AMMONIA 76*   CBC:  Recent Labs Lab 12/19/14 1410 12/24/14 1935 12/25/14 0230  WBC 2.7*  5.2 3.8*  NEUTROABS 1.6  --   --   HGB 10.8* 10.3* 10.4*  HCT 33.2* 31.6* 32.2*  MCV 76.9* 77.3* 78.3  PLT 107* 168 146*   Cardiac Enzymes: No results for input(s): CKTOTAL, CKMB, CKMBINDEX, TROPONINI in the last 168 hours. BNP (last 3 results) No results for input(s): BNP in the last 8760 hours.  ProBNP (last 3 results) No results for input(s): PROBNP in the last 8760 hours.  CBG: No results for input(s): GLUCAP in the last 168 hours.  No results found for this or any previous visit (from the past 240 hour(s)).   Studies: Ct Head Wo Contrast  12/25/2014   CLINICAL DATA:  Confusion. Previous CT abdomen and pelvis with IV contrast material at 2213 hr on 12/24/2014. History of breast cancer.  EXAM: CT HEAD WITHOUT CONTRAST  TECHNIQUE: Contiguous axial images were obtained from the base of the skull through the vertex without intravenous contrast.  COMPARISON:  02/16/2014  FINDINGS: Focal encephalomalacia in the right anterior periventricular white matter is unchanged since previous study and likely represents focal area of ischemia. No mass effect or midline shift. No abnormal extra-axial fluid collections. Gray-white matter junctions are distinct. Basal cisterns are not effaced. No evidence of acute intracranial hemorrhage. No depressed skull fractures. Diffuse heterogeneous appearance of the calvarium consistent with known metastases. Visualized paranasal sinuses and mastoid air cells are not opacified.  IMPRESSION: Unchanged appearance of chronic white matter encephalomalacia, likely ischemic. Diffuse bone metastases again demonstrated. No acute intracranial abnormalities.   Electronically Signed   By: Lucienne Capers M.D.   On: 12/25/2014 00:55   Ct Abdomen Pelvis W Contrast  12/24/2014   EXAM: CT ABDOMEN AND PELVIS WITH CONTRAST  TECHNIQUE: Multidetector CT imaging of the abdomen and pelvis was performed using the standard protocol following bolus administration of intravenous contrast.   CONTRAST:  145mL OMNIPAQUE IOHEXOL 300 MG/ML  SOLN  COMPARISON:  Prior MRI from 12/20/2014 as well as CT from 11/08/2014.  FINDINGS: Small bilateral pleural effusions with associated bibasilar atelectasis is present. No pericardial effusion.  Previously identified macroscopic hepatic metastases are grossly stable relative to recent MRI. For reference purposes, the largest lesion in the right hepatic lobe measures 2.9 x 2.1 cm. Again, the liver demonstrates a diffusely heterogeneous appearance with nodular contour, suggestive of cirrhosis or possibly pseudo cirrhosis. These changes are grossly similar relative to recent MRI.  Cholelithiasis again noted within the gallbladder lumen. No biliary dilatation. Spleen within normal limits. Adrenal glands and pancreas are normal.  1.7 cm cyst present within the left kidney. No nephrolithiasis, hydronephrosis, or focal enhancing renal mass.  Mild circumferential wall thickening noted within the partially visualized distal esophagus, most commonly related to reflex. Stomach otherwise unremarkable. No evidence for bowel obstruction. No definite acute inflammatory changes seen about the bowels.  Mild circumferential bladder wall thickening. 7 mm hyperdensity at the posterior aspect of the bladder lumen is stable from prior study.  Uterus and ovaries are not visualized.  Moderate volume  ascites again seen within the abdomen and pelvis. No frank peritoneal implant or omental caking identified.  No free air.  No pathologically enlarged intra-abdominal pelvic lymph nodes identified.  Widespread diffuse osseous metastases again seen. No pathologic fracture.  IMPRESSION: 1. Stable appearance of macroscopic intrahepatic metastases as compared to recent MRI from 12/20/2014. 2. Diffusely heterogeneous and nodular contour of the liver, again suggestive of cirrhosis or possibly pseudo cirrhosis. These findings are stable relative to recent MRI. 3. Moderate volume ascites within the abdomen  and pelvis. 4. Small bilateral pleural effusions with associated bibasilar atelectasis. 5. Diffuse esophageal wall thickening within the distal esophagus. Finding may be related to post radiation changes or possibly reflux. 6. Widespread diffuse osseous metastatic disease without pathologic fracture. 7. Cholelithiasis.   Electronically Signed   By: Jeannine Boga M.D.   On: 12/24/2014 22:53    Scheduled Meds: . docusate sodium  100 mg Oral BID  . pantoprazole (PROTONIX) IV  40 mg Intravenous QHS  . senna  1 tablet Oral BID  . sodium chloride  3 mL Intravenous Q12H  . sucralfate  1 g Oral TID WC & HS   Continuous Infusions:   Active Problems:   Breast carcinoma metastatic to pelvis   Essential hypertension   Breast cancer metastasized to multiple sites   Liver metastases   Intractable nausea and vomiting   Cancer associated pain   Cancer related pain    Dyllen Menning A, MD  [facility] Pager [phone]. If 7PM-7AM, please contact night-coverage at [email or web address], password [facility] 12/25/2014, 11:37 AM  LOS: 0 days

## 2014-12-25 NOTE — ED Notes (Signed)
Delay in transport due to patient needs CT head scan.

## 2014-12-25 NOTE — ED Notes (Signed)
Pt in CT.

## 2014-12-26 DIAGNOSIS — R945 Abnormal results of liver function studies: Secondary | ICD-10-CM

## 2014-12-26 DIAGNOSIS — R7989 Other specified abnormal findings of blood chemistry: Secondary | ICD-10-CM

## 2014-12-26 DIAGNOSIS — C787 Secondary malignant neoplasm of liver and intrahepatic bile duct: Principal | ICD-10-CM

## 2014-12-26 DIAGNOSIS — R1084 Generalized abdominal pain: Secondary | ICD-10-CM

## 2014-12-26 DIAGNOSIS — C7951 Secondary malignant neoplasm of bone: Secondary | ICD-10-CM

## 2014-12-26 DIAGNOSIS — I959 Hypotension, unspecified: Secondary | ICD-10-CM

## 2014-12-26 DIAGNOSIS — C50411 Malignant neoplasm of upper-outer quadrant of right female breast: Secondary | ICD-10-CM

## 2014-12-26 DIAGNOSIS — K7460 Unspecified cirrhosis of liver: Secondary | ICD-10-CM

## 2014-12-26 DIAGNOSIS — R932 Abnormal findings on diagnostic imaging of liver and biliary tract: Secondary | ICD-10-CM

## 2014-12-26 DIAGNOSIS — C7989 Secondary malignant neoplasm of other specified sites: Secondary | ICD-10-CM

## 2014-12-26 DIAGNOSIS — C801 Malignant (primary) neoplasm, unspecified: Secondary | ICD-10-CM

## 2014-12-26 LAB — COMPREHENSIVE METABOLIC PANEL
ALK PHOS: 730 U/L — AB (ref 39–117)
ALT: 107 U/L — AB (ref 0–35)
AST: 349 U/L — ABNORMAL HIGH (ref 0–37)
Albumin: 2.6 g/dL — ABNORMAL LOW (ref 3.5–5.2)
Anion gap: 9 (ref 5–15)
BUN: 30 mg/dL — AB (ref 6–23)
CHLORIDE: 111 mmol/L (ref 96–112)
CO2: 21 mmol/L (ref 19–32)
CREATININE: 0.91 mg/dL (ref 0.50–1.10)
Calcium: 9.3 mg/dL (ref 8.4–10.5)
GFR calc Af Amer: 86 mL/min — ABNORMAL LOW (ref >90)
GFR calc non Af Amer: 74 mL/min — ABNORMAL LOW (ref >90)
GLUCOSE: 100 mg/dL — AB (ref 70–99)
POTASSIUM: 4.1 mmol/L (ref 3.5–5.1)
Sodium: 141 mmol/L (ref 135–145)
Total Bilirubin: 3.8 mg/dL — ABNORMAL HIGH (ref 0.3–1.2)
Total Protein: 5.7 g/dL — ABNORMAL LOW (ref 6.0–8.3)

## 2014-12-26 LAB — CBC
HEMATOCRIT: 44.8 % (ref 36.0–46.0)
Hemoglobin: 14.1 g/dL (ref 12.0–15.0)
MCH: 24.6 pg — AB (ref 26.0–34.0)
MCHC: 31.5 g/dL (ref 30.0–36.0)
MCV: 78.2 fL (ref 78.0–100.0)
Platelets: 93 10*3/uL — ABNORMAL LOW (ref 150–400)
RBC: 5.73 MIL/uL — ABNORMAL HIGH (ref 3.87–5.11)
RDW: 16.3 % — AB (ref 11.5–15.5)
WBC: 2.4 10*3/uL — ABNORMAL LOW (ref 4.0–10.5)

## 2014-12-26 LAB — URINE CULTURE: Colony Count: >=100000

## 2014-12-26 MED ORDER — SODIUM CHLORIDE 0.9 % IV SOLN
INTRAVENOUS | Status: AC
Start: 2014-12-26 — End: 2014-12-27
  Administered 2014-12-26 – 2014-12-27 (×3): via INTRAVENOUS

## 2014-12-26 MED ORDER — LACTULOSE 10 GM/15ML PO SOLN
10.0000 g | Freq: Three times a day (TID) | ORAL | Status: DC
  Administered 2014-12-26 – 2014-12-31 (×14): 10 g via ORAL
  Filled 2014-12-26 (×14): qty 30

## 2014-12-26 NOTE — Progress Notes (Signed)
CARE MANAGEMENT NOTE 12/26/2014  Patient:  Tammy Blanchard, Tammy Blanchard   Account Number:  [id number]  Date Initiated:  12/26/2014  Documentation initiated by:  Karl Bales  Subjective/Objective Assessment:   Pt admitted with cco N,V,D     Action/Plan:   from home   Anticipated DC Date:  12/27/2014   Anticipated DC Plan:  [place]  CM consult      Choice offered to / List presented to:  NA           Status of service:  In process, will continue to follow Medicare Important Message given?   (If response is "NO", the following Medicare IM given date fields will be blank) Date Medicare IM given:   Medicare IM given by:   Date Additional Medicare IM given:   Additional Medicare IM given by:    Discharge Disposition:    Per UR Regulation:  Reviewed for med. necessity/level of care/duration of stay  If discussed at [place] of Stay Meetings, dates discussed:    Comments:  12/26/14 MMcGibboney, RN, BSN Chart reviewed.

## 2014-12-26 NOTE — Consult Note (Signed)
Consultation  Referring Provider: [facility]      Primary Care Physician:  Angelica Chessman, MD Primary Gastroenterologist: Zenovia Jarred, MD         Reason for Consultation:   Abnormal LFTs           HPI:   Tammy Blanchard is a 49 y.o. female with stage IV breast cancer originally diagnosed in 2009. Patient underwent chemoradiation and surgery but was found to have metastatic disease in Jan 2014.  Over the last year her LFTs have been abnormal (mixed pattern) but number have progressively gotten worse over the last several weeks despite "stable", or even improved metastatic liver lesions. She has gallstones but not biliary duct dilation on CT scan. There is also ascites and suggestion of cirrhosis (new).  Patient began having intermittent right mid abdominal pain several weeks ago, around the same time abdomen began to swell. LFTs abnormal for a year (mainly alk phos) but now with marked elevation of transaminases as well as alk phos. Bilirubin now rising as well.   Patient has been on two oral chemotherapeutic agents since Fall of 2015. Everolimus dose decreased in November then stopped 12/15/14 because of abnormal LFTs  Patient is having recurrent dysphagia, even with liquids. She had a nice response to dilation of an esophageal stricture in May 2015.    Past Medical History  Diagnosis Date  . Hypertension   . Allergy   . GERD (gastroesophageal reflux disease)   . Thyroid disease   . Anemia     resolved 2011  . Hyperlipidemia     controlled  . History of blood transfusion 2009    [facility] -  UNKNOWN NUMBER OF UNITS TRANSFUSED  . 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 Cesarean section    . Wisdom tooth extraction    . Tubal ligation    . Breast surgery  10/2009    right lymp nodes removed  . Left foot surgery    . Abdominal hysterectomy  11/25/2012    Procedure: HYSTERECTOMY ABDOMINAL;  Surgeon: [name], MD;  Location: [facility];  Service: Gynecology;  Laterality: N/A;  with Bilateral Salpingoopherectomy and Cystoscopy  . Mastectomy complete / simple Bilateral 05/25/2013  . Mass biopsy  05/25/2013    on abdomen and right chest wall, and Right neck Archie Endo 05/25/2013  . Total mastectomy Bilateral 05/25/2013    Dr [name]   . Total mastectomy Bilateral 05/25/2013    Procedure: Bilateral Total Mastectomy;  Surgeon: Merrie Roof, MD;  Location: [place] [facility];  Service: General;  Laterality: Bilateral;  . Mass biopsy N/A 05/25/2013    Procedure: Biopsy  nodule on abdomen and right chest wall, and Right neck;  Surgeon: Merrie Roof, MD;  Location: [facility];  Service: General;  Laterality: N/A;    Family History  Problem Relation Age of Onset  . Diabetes Mother   . Hypertension Mother   . Diabetes Maternal Aunt   . Heart disease Maternal Aunt   . Hypertension Maternal Aunt   . Stroke Maternal Aunt   . Diabetes Maternal Grandmother   . Heart disease Maternal Grandmother   . Hypertension Maternal Grandmother   . Stroke Maternal Grandmother   . Diabetes Maternal Aunt   . Hypertension Maternal Aunt   . Esophageal cancer Neg Hx   . Stomach cancer Neg Hx   . Colon cancer Neg Hx  No liver disease.   History  Substance Use Topics  . Smoking status: Current Some Day Smoker -- 0.50 packs/day for 23 years    Types: Cigarettes    Last Attempt to Quit: 11/25/2012  . Smokeless tobacco: Never Used  . Alcohol Use: Yes     Comment: social    Prior to Admission medications   Medication Sig Start Date End Date Taking? Authorizing Provider  oxyCODONE (OXY IR/ROXICODONE) 5 MG immediate release tablet Take 1 tablet (5 mg total) by mouth every 6 (six) hours as needed for severe pain. 12/19/14  Yes Drue Second, NP  exemestane (AROMASIN) 25 MG tablet Take 1 tablet (25 mg total) by mouth daily after breakfast. Patient not taking: Reported on 12/24/2014 11/20/14   Chauncey Cruel, MD    lidocaine-prilocaine (EMLA) cream Apply 1 application topically as needed. Apply over portacath  1 1/2 hours to 2 hours prior to procedures as needed. 02/22/14   Amy Milda Smart, PA-C  prochlorperazine (COMPAZINE) 10 MG tablet Take 10 mg by mouth every 6 (six) hours as needed for nausea or vomiting.    Historical Provider, MD  Zoledronic Acid (ZOMETA IV) Inject 4 mg into the vein every 6 (six) weeks.  03/17/13   Chauncey Cruel, MD    Current Facility-Administered Medications  Medication Dose Route Frequency Provider Last Rate Last Dose  . acetaminophen (TYLENOL) tablet 650 mg  650 mg Oral Q6H PRN Toy Baker, MD       Or  . acetaminophen (TYLENOL) suppository 650 mg  650 mg Rectal Q6H PRN Toy Baker, MD      . bisacodyl (DULCOLAX) suppository 10 mg  10 mg Rectal Daily PRN Toy Baker, MD      . docusate sodium (COLACE) capsule 100 mg  100 mg Oral BID Toy Baker, MD   100 mg at 12/25/14 2234  . enoxaparin (LOVENOX) injection 40 mg  40 mg Subcutaneous Q24H Bynum Bellows, MD   40 mg at 12/25/14 1443  . gi cocktail (Maalox,Lidocaine,Donnatal)  30 mL Oral BID PRN Toy Baker, MD      . HYDROmorphone (DILAUDID) injection 0.5-1 mg  0.5-1 mg Intravenous Q3H PRN Toy Baker, MD      . lactulose (CHRONULAC) 10 GM/15ML solution 10 g  10 g Oral TID Caren Griffins, MD   10 g at 12/26/14 1202  . nicotine (NICODERM CQ - dosed in mg/24 hr) patch 7 mg  7 mg Transdermal Daily Gardiner Barefoot, NP   7 mg at 12/25/14 2233  . ondansetron (ZOFRAN) tablet 4 mg  4 mg Oral Q6H PRN Toy Baker, MD       Or  . ondansetron (ZOFRAN) injection 4 mg  4 mg Intravenous Q6H PRN Toy Baker, MD   4 mg at 12/26/14 0004  . oxyCODONE (Oxy IR/ROXICODONE) immediate release tablet 10 mg  10 mg Oral Q6H PRN Toy Baker, MD   10 mg at 12/25/14 2233  . pantoprazole (PROTONIX) injection 40 mg  40 mg Intravenous QHS Toy Baker, MD   40 mg at 12/25/14 2233  .  polyethylene glycol (MIRALAX / GLYCOLAX) packet 17 g  17 g Oral Daily PRN Toy Baker, MD      . prochlorperazine (COMPAZINE) tablet 10 mg  10 mg Oral Q6H PRN Toy Baker, MD      . senna (SENOKOT) tablet 8.6 mg  1 tablet Oral BID Toy Baker, MD   8.6 mg at 12/25/14 2234  . sodium chloride 0.9 %  injection 10-40 mL  10-40 mL Intracatheter PRN Toy Baker, MD   20 mL at 12/25/14 0240  . sodium chloride 0.9 % injection 3 mL  3 mL Intravenous Q12H Toy Baker, MD   3 mL at 12/25/14 1128  . sucralfate (CARAFATE) 1 GM/10ML suspension 1 g  1 g Oral TID WC & HS Toy Baker, MD   1 g at 12/26/14 0756   Facility-Administered Medications Ordered in Other Encounters  Medication Dose Route Frequency Provider Last Rate Last Dose  . sodium chloride 0.9 % injection 10 mL  10 mL Intravenous PRN Chauncey Cruel, MD   10 mL at 02/28/14 1109    Allergies as of 12/24/2014 - Review Complete 12/24/2014  Allergen Reaction Noted  . Metronidazole Swelling 12/05/2010  . Tegaderm ag mesh [silver]  11/08/2014  . Tramadol Nausea Only 07/19/2013    Review of Systems:    All systems reviewed and negative except where noted in HPI.   Physical Exam:  Vital signs in last 24 hours: Temp:  [98.7 F (37.1 C)-99.5 F (37.5 C)] 99.5 F (37.5 C) (02/22 2118) Pulse Rate:  [102-103] 103 (02/22 2118) Resp:  [20] 20 (02/22 2118) BP: (114-117)/(75-79) 117/75 mmHg (02/22 2118) SpO2:  [99 %-100 %] 100 % (02/22 2118) Last BM Date: 12/25/14 General:   Pleasant black female in NAD Head:  Normocephalic and atraumatic. Eyes:   No icterus.   Conjunctiva pink. Ears:  Normal auditory acuity. Neck:  Supple; no masses felt Lungs:  Respirations even and unlabored. A few bibasilar crackles..  Heart:  Slightly tachycardic. Abdomen:  Soft, nondistended, nontender. Normal bowel sounds. No appreciable masses or hepatomegaly.  Rectal:  Not performed.  Msk:  Symmetrical without gross  deformities.  Extremities:  Without edema. Neurologic:  Alert and  oriented x4;  grossly normal neurologically. Skin:  Intact without significant lesions or rashes. Cervical Nodes:  No significant cervical adenopathy. Psych:  Alert and cooperative. Normal affect.  LAB RESULTS:  Recent Labs  12/24/14 1935 12/25/14 0230 12/26/14 0440  WBC 5.2 3.8* 2.4*  HGB 10.3* 10.4* 14.1  HCT 31.6* 32.2* 44.8  PLT 168 146* 93*   BMET  Recent Labs  12/24/14 1935 12/25/14 0230 12/26/14 0440  NA 136 137 141  K 3.7 3.9 4.1  CL 103 110 111  CO2 '22 21 21  ' GLUCOSE 114* 104* 100*  BUN 34* 32* 30*  CREATININE 1.10 1.01 0.91  CALCIUM 10.2 8.9 9.3   LFT  Recent Labs  12/26/14 0440  PROT 5.7*  ALBUMIN 2.6*  AST 349*  ALT 107*  ALKPHOS 730*  BILITOT 3.8*   PT/INR  Recent Labs  12/25/14 0200  LABPROT 14.3  INR 1.09    STUDIES:  Ct Abdomen Pelvis W Contrast  12/24/2014   EXAM: CT ABDOMEN AND PELVIS WITH CONTRAST  TECHNIQUE: Multidetector CT imaging of the abdomen and pelvis was performed using the standard protocol following bolus administration of intravenous contrast.  CONTRAST:  132m OMNIPAQUE IOHEXOL 300 MG/ML  SOLN  COMPARISON:  Prior MRI from 12/20/2014 as well as CT from 11/08/2014.  FINDINGS: Small bilateral pleural effusions with associated bibasilar atelectasis is present. No pericardial effusion.  Previously identified macroscopic hepatic metastases are grossly stable relative to recent MRI. For reference purposes, the largest lesion in the right hepatic lobe measures 2.9 x 2.1 cm. Again, the liver demonstrates a diffusely heterogeneous appearance with nodular contour, suggestive of cirrhosis or possibly pseudo cirrhosis. These changes are grossly similar relative to recent MRI.  Cholelithiasis again noted within the gallbladder lumen. No biliary dilatation. Spleen within normal limits. Adrenal glands and pancreas are normal.  1.7 cm cyst present within the left kidney. No  nephrolithiasis, hydronephrosis, or focal enhancing renal mass.  Mild circumferential wall thickening noted within the partially visualized distal esophagus, most commonly related to reflex. Stomach otherwise unremarkable. No evidence for bowel obstruction. No definite acute inflammatory changes seen about the bowels.  Mild circumferential bladder wall thickening. 7 mm hyperdensity at the posterior aspect of the bladder lumen is stable from prior study.  Uterus and ovaries are not visualized.  Moderate volume ascites again seen within the abdomen and pelvis. No frank peritoneal implant or omental caking identified.  No free air.  No pathologically enlarged intra-abdominal pelvic lymph nodes identified.  Widespread diffuse osseous metastases again seen. No pathologic fracture.  IMPRESSION: 1. Stable appearance of macroscopic intrahepatic metastases as compared to recent MRI from 12/20/2014. 2. Diffusely heterogeneous and nodular contour of the liver, again suggestive of cirrhosis or possibly pseudo cirrhosis. These findings are stable relative to recent MRI. 3. Moderate volume ascites within the abdomen and pelvis. 4. Small bilateral pleural effusions with associated bibasilar atelectasis. 5. Diffuse esophageal wall thickening within the distal esophagus. Finding may be related to post radiation changes or possibly reflux. 6. Widespread diffuse osseous metastatic disease without pathologic fracture. 7. Cholelithiasis.   Electronically Signed   By: Jeannine Boga M.D.   On: 12/24/2014 22:53    PREVIOUS ENDOSCOPIES:            EGD May 2015 ([facility])  - Benign stricture, s/p dilation   Impression / Plan:   14. 49 year old female with stage IV breast cancer involving bones, uterus, ovaries, abdomen (carcinomatosis), on chemotherapy   2. Abnormal LFTs (mixed pattern). CTscan and MRI suggest cirrhosis which is a new finding. She has also developed ascites. No varices on EGD less than a year ago. 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 (this is something that can be seen with hepatic mets from breast cancer). Assuming she has true underlying cirrhosis the etiology is unclear. No history of ETOH abuse. Could cirrhosis be result of chemo agent?  Liver biopsy already ordered. Will order autoimmune / genetic markers of liver disease. Viral hepatitis studies already done and were negative. Patient having intermittent right mid abdominal pain over last several weeks which may or may not be liver related. Diagnostic paracentesis may be helpful though patient has known mets to abdomen / carcinomatosis.  3. Recurrent dysphagia. Hx of esophageal stricture dilated May 2015. We can repeat EGD with dilation when acute medical issues improve (this will likely be done outpatient). There is thickening of distal esophagus on CTscan which could be esophagitis or hiatal hernia.    Thanks   LOS: 1 day   Tye Savoy  12/26/2014, 12:21 PM      Attending physician's note   I have taken a history, examined the patient and reviewed the chart. I agree with the Advanced Practitioner's note, impression and recommendations. Rising LFTs in a mixed pattern with newly diagnosed cirrhosis and ascites. Etiology of both is unclear. She has metastatic disease to the liver which appears to be stable by imaging. Possibly medication induced liver disease, NASH, etc. Everolimus was recently stopped. Send all standard hepatic serologies. Proceed with transjugular liver biopsy. Diagnostic paracentesis to R/O SBP and characterize ascites.   Ladene Artist, MD [facility]

## 2014-12-26 NOTE — Progress Notes (Signed)
Patient refused to have morning vital signs done. Will keep monitoring the patient.

## 2014-12-26 NOTE — Consult Note (Signed)
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 distant skin sites noted June 2014, with  (a) biopsy of the left breast and a left axillary node 04/29/2013 confirming 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  (11) status post bilateral mastectomies 05/25/2013, showing: (a) on the left, mypT1c NX invasive mammary carcinoma, with ductal and lobular features, grade 1, repeat HER-2 again negative (b) on the right, yp T2 NX invasive lobular breast cancer, grade 1, with negative margins, and HER-2 again  negative  (12)  right chest wall skin biopsy and right neck biopsy both positive for metastatic breast cancer  (13) fulvestrant at 500 mg monthly started 06/10/2013, last dose 01/17/2014, with progression  (14) Hot flashes, improving on clonidine at bedtime  (15) Depression, started oral antidepressants, not taking.  (16) Skin lesion, right arm  (17) thyroid nodule - biopsy 02/28/2014 benign  (18) Abraxane, first dose 02/21/2014, to be given day 1 and day 8 of every 21 day cycle, with restaging after 4 cycles showing stable/improved disease; carboplatin added to day one beginning with cycle 5  (19) Patient has progressed on Carbo/Abraxane and started Eribulin Mesylate on 07/04/14, stopped after one cycle because of worsening neuropathy symptoms  (20) exemestane and everolimus started 07/28/2014; everolimus dose dropped to 84m starting 09/04/2014, discontinued 12/15/2014 with rise in transaminases  (21) dysphagia - swallowing study May 2015 showed esophageal narrowing, no mass; S/P esophageal dilatation with improvement.   PLAN: I discussed with CJayliahthe fact that the LFTs are a bit worse today, certainly no better. Given that, it is best to proceed to liver biopsy. It would make sense to start with paracentesis, which will provide the patient with some relief and the team with more information. The real question though is what is going on in the liver--while the likelihood of this being rapidly developing mets is real, the picture is sufficiently atypical yo warrants biopsy, in my opinion.  I have discussed this with the patient and her family and they are willing to proceed/  Greatly appreciate the help of the GI and hospitalist services to this patient!    MChauncey Cruel MD   12/26/2014 1:34 PM Medical Oncology and Hematology [facility]AF[place] [address]Tel. [phone]   Fax. [phone]

## 2014-12-26 NOTE — Progress Notes (Signed)
RN notified the PCP that the NS IV infusion was expired and did PCP want to keep NS at [facility] or have another order. Awaiting any new orders.

## 2014-12-26 NOTE — Progress Notes (Addendum)
[facility] PROGRESS NOTE  Tammy Blanchard XNT:[id number] DOB: 1966/09/25 DOA: 12/24/2014 PCP: Angelica Chessman, MD   Subjective: - ongoing abdominal pain  Assessment/Plan:  Elevated liver function tests - CT shows findings suggestive of cirrhosis.  Unclear etiology, appreciate GI input - Discussed with Dr. [name], plan for liver biopsy, appreciate interventional radiologist assistance - elevated ammonia - start Lactulose - Liver function tests without improvement  Intractable nausea, vomiting, and abdominal pain - CT without evidence of obstruction.  Abdominal CT also showed cholelithiasis, doubt cholecystitis. - CT of abdomen and pelvis also showed moderate volume ascites, obtain diagnostic paracentesis - Supportive management with IV fluids and antiemetics.  Dehydration - Due to above, nausea and vomiting.  Stage IV breast cancer with widespread metastatic disease - Management as per oncology  Essential hypertension  - Hypertension improved with IV fluids.  Patient does not appear to be on any antihypertensive medications at home.    Possible esophagitis  - Supportive management, patient started on IV Protonix.  Chronic pain syndrome/cancer associated pain  - IV pain medications as needed.    Acute encephalopathy - Multifactorial, may be due to dehydration.  Head CT showed unchanged appearance of chronic white matter encephalomalacia, likely due to ischemia with diffuse bone metastasis.  Prophylaxis - Lovenox, Protonix  Code Status: Full code. Family Communication: family bedside Disposition Plan: Pending  Consultants:  Oncology - Dr. [name]  Procedures:  Head CT - 12/25/2014  CT abdomen and pelvis with contrast - 12/24/2014  Antibiotics:  None  Objective: Filed Vitals:   12/25/14 0621 12/25/14 1502 12/25/14 2118 12/26/14 1440  BP: 104/59 114/79 117/75 96/75  Pulse: 100 102 103 104  Temp: 98.9 F (37.2 C) 98.7 F (37.1 C) 99.5 F  (37.5 C) 99.7 F (37.6 C)  TempSrc: Oral Oral Oral Oral  Resp: 20 20 20 19   Height:      Weight:      SpO2: 99% 99% 100% 97%    Intake/Output Summary (Last 24 hours) at 12/26/14 1447 Last data filed at 12/26/14 0911  Gross per 24 hour  Intake 1968.75 ml  Output      0 ml  Net 1968.75 ml   Filed Weights   12/24/14 1858 12/25/14 0117  Weight: 66.225 kg (146 lb) 66.8 kg (147 lb 4.3 oz)   Exam: General: Awake, Oriented, No acute distress. HEENT: EOMI. Neck: Supple CV: S1 and S2 Lungs: Clear to ascultation bilaterally Abdomen: Soft, generalized tenderness, mild distention, +bowel sounds. Ext: Good pulses. Trace edema.  Data Reviewed: Basic Metabolic Panel:  Recent Labs Lab 12/24/14 1935 12/25/14 0230 12/26/14 0440  NA 136 137 141  K 3.7 3.9 4.1  CL 103 110 111  CO2 22 21 21   GLUCOSE 114* 104* 100*  BUN 34* 32* 30*  CREATININE 1.10 1.01 0.91  CALCIUM 10.2 8.9 9.3  MG  --  2.1  --   PHOS  --  3.8  --    Liver Function Tests:  Recent Labs Lab 12/24/14 1935 12/25/14 0230 12/26/14 0440  AST 397* 337* 349*  ALT 119* 105* 107*  ALKPHOS 836* 731* 730*  BILITOT 3.5* 3.4* 3.8*  PROT 6.6 6.0 5.7*  ALBUMIN 3.0* 2.6* 2.6*    Recent Labs Lab 12/24/14 1935  LIPASE 54    Recent Labs Lab 12/25/14 0016  AMMONIA 76*   CBC:  Recent Labs Lab 12/24/14 1935 12/25/14 0230 12/26/14 0440  WBC 5.2 3.8* 2.4*  HGB 10.3* 10.4* 14.1  HCT 31.6* 32.2*  44.8  MCV 77.3* 78.3 78.2  PLT 168 146* 93*    Recent Results (from the past 240 hour(s))  Urine culture     Status: None   Collection Time: 12/25/14  1:53 AM  Result Value Ref Range Status   Specimen Description URINE, CLEAN CATCH  Final   Special Requests NONE  Final   Colony Count   Final    >=100,000 COLONIES/ML Performed at [facility]    Culture   Final    Multiple bacterial morphotypes present, none predominant. Suggest appropriate recollection if clinically indicated. Performed at [facility]    Report Status 12/26/2014 FINAL  Final     Studies: Ct Head Wo Contrast  12/25/2014   CLINICAL DATA:  Confusion. Previous CT abdomen and pelvis with IV contrast material at 2213 hr on 12/24/2014. History of breast cancer.  EXAM: CT HEAD WITHOUT CONTRAST  TECHNIQUE: Contiguous axial images were obtained from the base of the skull through the vertex without intravenous contrast.  COMPARISON:  02/16/2014  FINDINGS: Focal encephalomalacia in the right anterior periventricular white matter is unchanged since previous study and likely represents focal area of ischemia. No mass effect or midline shift. No abnormal extra-axial fluid collections. Gray-white matter junctions are distinct. Basal cisterns are not effaced. No evidence of acute intracranial hemorrhage. No depressed skull fractures. Diffuse heterogeneous appearance of the calvarium consistent with known metastases. Visualized paranasal sinuses and mastoid air cells are not opacified.  IMPRESSION: Unchanged appearance of chronic white matter encephalomalacia, likely ischemic. Diffuse bone metastases again demonstrated. No acute intracranial abnormalities.   Electronically Signed   By: Lucienne Capers M.D.   On: 12/25/2014 00:55   Ct Abdomen Pelvis W Contrast  12/24/2014   EXAM: CT ABDOMEN AND PELVIS WITH CONTRAST  TECHNIQUE: Multidetector CT imaging of the abdomen and pelvis was performed using the standard protocol following bolus administration of intravenous contrast.  CONTRAST:  144mL OMNIPAQUE IOHEXOL 300 MG/ML  SOLN  COMPARISON:  Prior MRI from 12/20/2014 as well as CT from 11/08/2014.  FINDINGS: Small bilateral pleural effusions with associated bibasilar atelectasis is present. No pericardial effusion.  Previously identified macroscopic hepatic metastases are grossly stable relative to recent MRI. For reference purposes, the largest lesion in the right hepatic lobe measures 2.9 x 2.1 cm. Again, the liver demonstrates a diffusely  heterogeneous appearance with nodular contour, suggestive of cirrhosis or possibly pseudo cirrhosis. These changes are grossly similar relative to recent MRI.  Cholelithiasis again noted within the gallbladder lumen. No biliary dilatation. Spleen within normal limits. Adrenal glands and pancreas are normal.  1.7 cm cyst present within the left kidney. No nephrolithiasis, hydronephrosis, or focal enhancing renal mass.  Mild circumferential wall thickening noted within the partially visualized distal esophagus, most commonly related to reflex. Stomach otherwise unremarkable. No evidence for bowel obstruction. No definite acute inflammatory changes seen about the bowels.  Mild circumferential bladder wall thickening. 7 mm hyperdensity at the posterior aspect of the bladder lumen is stable from prior study.  Uterus and ovaries are not visualized.  Moderate volume ascites again seen within the abdomen and pelvis. No frank peritoneal implant or omental caking identified.  No free air.  No pathologically enlarged intra-abdominal pelvic lymph nodes identified.  Widespread diffuse osseous metastases again seen. No pathologic fracture.  IMPRESSION: 1. Stable appearance of macroscopic intrahepatic metastases as compared to recent MRI from 12/20/2014. 2. Diffusely heterogeneous and nodular contour of the liver, again suggestive of cirrhosis or possibly pseudo cirrhosis. These  findings are stable relative to recent MRI. 3. Moderate volume ascites within the abdomen and pelvis. 4. Small bilateral pleural effusions with associated bibasilar atelectasis. 5. Diffuse esophageal wall thickening within the distal esophagus. Finding may be related to post radiation changes or possibly reflux. 6. Widespread diffuse osseous metastatic disease without pathologic fracture. 7. Cholelithiasis.   Electronically Signed   By: Jeannine Boga M.D.   On: 12/24/2014 22:53    Scheduled Meds: . docusate sodium  100 mg Oral BID  . enoxaparin  (LOVENOX) injection  40 mg Subcutaneous Q24H  . lactulose  10 g Oral TID  . nicotine  7 mg Transdermal Daily  . pantoprazole (PROTONIX) IV  40 mg Intravenous QHS  . senna  1 tablet Oral BID  . sodium chloride  3 mL Intravenous Q12H  . 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 If 7PM-7AM, please contact night-coverage at [email or web address], password [facility] 12/26/2014, 2:47 PM  LOS: 1 day

## 2014-12-26 NOTE — Progress Notes (Signed)
I agree with the previous RN's assessment.  Pt refusing most medications; educated on importance of certain medications.

## 2014-12-26 NOTE — Progress Notes (Addendum)
IR PA aware of request for liver biopsy for elevated LFT's and concern for cirrhosis or metastatic disease, I have discussed case with Dr. Anselm Pancoast today and we will wait until Oncology- Dr. [name] evaluates the patient prior to moving forward with transjugular biopsy for cirrhosis, this may have to be done Friday if warranted secondary to IR schedule.   Tsosie Billing PA-C Interventional Radiology  12/26/14  2:38 PM   I have spoke with Dr. Cruzita Lederer today and Dr. Earleen Newport and US paracentesis has been ordered for diagnostic purposes, we will perform paracentesis and wait for fluid results prior to liver biopsy.   Tsosie Billing PA-C Interventional Radiology  12/26/14  3:58 PM

## 2014-12-27 ENCOUNTER — Ambulatory Visit: Payer: Medicaid Other | Admitting: Nurse Practitioner

## 2014-12-27 ENCOUNTER — Ambulatory Visit: Payer: Medicaid Other

## 2014-12-27 ENCOUNTER — Inpatient Hospital Stay (HOSPITAL_COMMUNITY): Payer: Medicaid Other

## 2014-12-27 ENCOUNTER — Other Ambulatory Visit: Payer: Medicaid Other

## 2014-12-27 DIAGNOSIS — R188 Other ascites: Secondary | ICD-10-CM | POA: Insufficient documentation

## 2014-12-27 LAB — COMPREHENSIVE METABOLIC PANEL
ALT: 114 U/L — ABNORMAL HIGH (ref 0–35)
AST: 404 U/L — ABNORMAL HIGH (ref 0–37)
Albumin: 2.7 g/dL — ABNORMAL LOW (ref 3.5–5.2)
Alkaline Phosphatase: 791 U/L — ABNORMAL HIGH (ref 39–117)
Anion gap: 12 (ref 5–15)
BUN: 31 mg/dL — ABNORMAL HIGH (ref 6–23)
CALCIUM: 9.6 mg/dL (ref 8.4–10.5)
CO2: 19 mmol/L (ref 19–32)
CREATININE: 1.02 mg/dL (ref 0.50–1.10)
Chloride: 110 mmol/L (ref 96–112)
GFR, EST AFRICAN AMERICAN: 75 mL/min — AB (ref >90)
GFR, EST NON AFRICAN AMERICAN: 64 mL/min — AB (ref >90)
GLUCOSE: 100 mg/dL — AB (ref 70–99)
Potassium: 4 mmol/L (ref 3.5–5.1)
Sodium: 141 mmol/L (ref 135–145)
TOTAL PROTEIN: 5.9 g/dL — AB (ref 6.0–8.3)
Total Bilirubin: 4.8 mg/dL — ABNORMAL HIGH (ref 0.3–1.2)

## 2014-12-27 LAB — FERRITIN: FERRITIN: 2551 ng/mL — AB (ref 10–291)

## 2014-12-27 LAB — BODY FLUID CELL COUNT WITH DIFFERENTIAL
LYMPHS FL: 27 %
Monocyte-Macrophage-Serous Fluid: 71 % (ref 50–90)
NEUTROPHIL FLUID: 2 % (ref 0–25)
WBC FLUID: 501 uL (ref 0–1000)

## 2014-12-27 LAB — CBC
HCT: 31.3 % — ABNORMAL LOW (ref 36.0–46.0)
HEMOGLOBIN: 9.8 g/dL — AB (ref 12.0–15.0)
MCH: 24.6 pg — AB (ref 26.0–34.0)
MCHC: 31.3 g/dL (ref 30.0–36.0)
MCV: 78.6 fL (ref 78.0–100.0)
PLATELETS: 170 10*3/uL (ref 150–400)
RBC: 3.98 MIL/uL (ref 3.87–5.11)
RDW: 16.6 % — ABNORMAL HIGH (ref 11.5–15.5)
WBC: 4.6 10*3/uL (ref 4.0–10.5)

## 2014-12-27 LAB — GLUCOSE, SEROUS FLUID: GLUCOSE FL: 100 mg/dL

## 2014-12-27 LAB — ALBUMIN, FLUID (OTHER): ALBUMIN FL: 1.4 g/dL

## 2014-12-27 LAB — MITOCHONDRIAL ANTIBODIES: MITOCHONDRIAL M2 AB, IGG: 5.5 U (ref 0.0–20.0)

## 2014-12-27 LAB — ANTI-SMOOTH MUSCLE ANTIBODY, IGG: F-Actin IgG: 17 Units (ref 0–19)

## 2014-12-27 LAB — PROTEIN, BODY FLUID: Total protein, fluid: <3 g/dL

## 2014-12-27 LAB — CLOSTRIDIUM DIFFICILE BY PCR: CDIFFPCR: NEGATIVE

## 2014-12-27 NOTE — Progress Notes (Signed)
    Progress Note   Subjective  having diffuse abdominal pain. Complains of loose stool   Objective   Vital signs in last 24 hours: Temp:  [98.3 F (36.8 C)-99.7 F (37.6 C)] 98.3 F (36.8 C) (02/24 0453) Pulse Rate:  [103-112] 103 (02/24 0453) Resp:  [18-19] 18 (02/24 0453) BP: (92-112)/(67-78) 92/67 mmHg (02/24 0453) SpO2:  [97 %-100 %] 100 % (02/24 0453) Last BM Date: 12/27/14 General:    Pleasant black female in NAD Abdomen:  Soft, mild diffuse tenderness, greatest in right mid to right lower abdomen. Hypoactive bowel sounds. Rectal: smear of stool in vault. No impaction.  Extremities:  Without edema. Neurologic:  Alert and oriented,  grossly normal neurologically. Psych:  Cooperative. Normal mood and affect.  Lab Results:  Recent Labs  12/25/14 0230 12/26/14 0440 12/27/14 0515  WBC 3.8* 2.4* 4.6  HGB 10.4* 14.1 9.8*  HCT 32.2* 44.8 31.3*  PLT 146* 93* 170   BMET  Recent Labs  12/25/14 0230 12/26/14 0440 12/27/14 0515  NA 137 141 141  K 3.9 4.1 4.0  CL 110 111 110  CO2 21 21 19   GLUCOSE 104* 100* 100*  BUN 32* 30* 31*  CREATININE 1.01 0.91 1.02  CALCIUM 8.9 9.3 9.6   LFT  Recent Labs  12/27/14 0515  PROT 5.9*  ALBUMIN 2.7*  AST 404*  ALT 114*  ALKPHOS 791*  BILITOT 4.8*   PT/INR  Recent Labs  12/25/14 0200  LABPROT 14.3  INR 1.09     Assessment / Plan:    9. 49 year old female with stage IV breast cancer involving bones, uterus, ovaries, abdomen (carcinomatosis), on chemotherapy   2. Abnormal LFTs (mixed pattern). CTscan and MRI suggest cirrhosis which is a new finding. She has also developed ascites. No varices on EGD less than a year ago.  Assuming she has underlying cirrhosis the etiology is unclear. Autoimmune / genetic markers of liver disease pending. Could cirrhosis be result of chemo agent? Liver biopsy scheduled for tomorrow. Viral hepatitis studies negative. Patient having intermittent right mid abdominal pain over last  several weeks which may or may not be liver related. She is scheduled for a diagnostic paracentesis today. If ascites from cirrhosis then she could have SBP though would expect more pain and tenderness. Check am LFTs, INR  3. Loose stool. Difficult to sort this out. She had loose stool a couple of months ago but it resolved and constipation followed. Now with several episodes of loose stool over last couple of days. This may be mult-factorial (chemo, dietary, other medications, etc...). Will check stool for c-diff. Don't suspect overflow diarrhea (not impacted on DRE)  3. Recurrent dysphagia. Hx of esophageal stricture dilated May 2015. We can repeat EGD with dilation when acute medical issues improve (this will likely be done outpatient). There is thickening of distal esophagus on CTscan which could be esophagitis or hiatal hernia.     LOS: 2 days   Tye Savoy  12/27/2014, 10:30 AM      Attending physician's note   I have taken an interval history, reviewed the chart and examined the patient. I agree with the Advanced Practitioner's note, impression and recommendations. Paracentesis scheduled for today and liver biopsy for tomorrow. Several hepatic serologies are pending. Check for C diff.   Pricilla Riffle. Fuller Plan, MD [facility]

## 2014-12-27 NOTE — Procedures (Signed)
US guided diagnostic/therapeutic paracentesis performed yielding 2.2 liters slightly turbid, yellow fluid. A portion of the fluid was sent to the lab for preordered studies. No immediate complications. 

## 2014-12-27 NOTE — Progress Notes (Signed)
[facility] PROGRESS NOTE  Tammy Blanchard KGY:[id number] DOB: [birth year] DOA: 12/24/2014 PCP: Angelica Chessman, MD   HPI Patient with stage IV breast cancer metastasized aches prepped pelvis, bones and liver and carcinomatosis Patient is status post chemotherapy and lumpectomy. Patient is currently on oral chemotherapy. For the past 2 weeks she have been having worsening abdominal pain and generalized pain.  Subjective: - ongoing abdominal pain  Assessment/Plan:  Elevated liver function tests - CT shows findings suggestive of cirrhosis.  Unclear etiology, appreciate GI input - Discussed with Dr. [name], plan for liver biopsy, appreciate interventional radiologist assistance - elevated ammonia - start Lactulose - Liver function tests unfortunately continued to get worse  Intractable nausea, vomiting, and abdominal pain - CT without evidence of obstruction.  Abdominal CT also showed cholelithiasis, doubt cholecystitis. - CT of abdomen and pelvis also showed moderate volume ascites, obtain diagnostic paracentesis 2/24 - Supportive management with IV fluids and antiemetics.  Dehydration - Due to above, nausea and vomiting.  Stage IV breast cancer with widespread metastatic disease - Management as per oncology  Essential hypertension  - Hypertension improved with IV fluids.  Patient does not appear to be on any antihypertensive medications at home.    Possible esophagitis  - Supportive management, patient started on IV Protonix.  Chronic pain syndrome/cancer associated pain  - IV pain medications as needed.    Acute encephalopathy - Multifactorial, may be due to dehydration.  Head CT showed unchanged appearance of chronic white matter encephalomalacia, likely due to ischemia with diffuse bone metastasis.  Prophylaxis - Lovenox, Protonix  Code Status: Full code. Family Communication: family bedside Disposition Plan: Pending  Consultants:  Oncology - Dr.  [name]  Procedures:  Head CT - 12/25/2014  CT abdomen and pelvis with contrast - 12/24/2014  Antibiotics:  None  Objective: Filed Vitals:   12/25/14 2118 12/26/14 1440 12/26/14 2108 12/27/14 0453  BP: 117/75 96/75 112/78 92/67  Pulse: 103 104 112 103  Temp: 99.5 F (37.5 C) 99.7 F (37.6 C) 99.3 F (37.4 C) 98.3 F (36.8 C)  TempSrc: Oral Oral Oral Oral  Resp: 20 19 18 18   Height:      Weight:      SpO2: 100% 97% 100% 100%    Intake/Output Summary (Last 24 hours) at 12/27/14 1256 Last data filed at 12/27/14 0900  Gross per 24 hour  Intake   1805 ml  Output    650 ml  Net   1155 ml   Filed Weights   12/24/14 1858 12/25/14 0117  Weight: 66.225 kg (146 lb) 66.8 kg (147 lb 4.3 oz)   Exam: General: Awake, Oriented, No acute distress. HEENT: EOMI. Neck: Supple CV: S1 and S2 Lungs: Clear to ascultation bilaterally Abdomen: Soft, generalized tenderness, mild distention, +bowel sounds. Ext: Good pulses. Trace edema.  Data Reviewed: Basic Metabolic Panel:  Recent Labs Lab 12/24/14 1935 12/25/14 0230 12/26/14 0440 12/27/14 0515  NA 136 137 141 141  K 3.7 3.9 4.1 4.0  CL 103 110 111 110  CO2 22 21 21 19   GLUCOSE 114* 104* 100* 100*  BUN 34* 32* 30* 31*  CREATININE 1.10 1.01 0.91 1.02  CALCIUM 10.2 8.9 9.3 9.6  MG  --  2.1  --   --   PHOS  --  3.8  --   --    Liver Function Tests:  Recent Labs Lab 12/24/14 1935 12/25/14 0230 12/26/14 0440 12/27/14 0515  AST 397* 337* 349* 404*  ALT 119* 105*  107* 114*  ALKPHOS 836* 731* 730* 791*  BILITOT 3.5* 3.4* 3.8* 4.8*  PROT 6.6 6.0 5.7* 5.9*  ALBUMIN 3.0* 2.6* 2.6* 2.7*    Recent Labs Lab 12/24/14 1935  LIPASE 54    Recent Labs Lab 12/25/14 0016  AMMONIA 76*   CBC:  Recent Labs Lab 12/24/14 1935 12/25/14 0230 12/26/14 0440 12/27/14 0515  WBC 5.2 3.8* 2.4* 4.6  HGB 10.3* 10.4* 14.1 9.8*  HCT 31.6* 32.2* 44.8 31.3*  MCV 77.3* 78.3 78.2 78.6  PLT 168 146* 93* 170    Recent Results  (from the past 240 hour(s))  Urine culture     Status: None   Collection Time: 12/25/14  1:53 AM  Result Value Ref Range Status   Specimen Description URINE, CLEAN CATCH  Final   Special Requests NONE  Final   Colony Count   Final    >=100,000 COLONIES/ML Performed at [facility]    Culture   Final    Multiple bacterial morphotypes present, none predominant. Suggest appropriate recollection if clinically indicated. Performed at [facility]    Report Status 12/26/2014 FINAL  Final     Studies: No results found.  Scheduled Meds: . docusate sodium  100 mg Oral BID  . enoxaparin (LOVENOX) injection  40 mg Subcutaneous Q24H  . lactulose  10 g Oral TID  . nicotine  7 mg Transdermal Daily  . pantoprazole (PROTONIX) IV  40 mg Intravenous QHS  . senna  1 tablet Oral BID  . sodium chloride  3 mL Intravenous Q12H  . sucralfate  1 g Oral TID WC & HS   Continuous Infusions: . sodium chloride 75 mL/hr at 12/27/14 5035    Active Problems:   Breast carcinoma metastatic to pelvis   Essential hypertension   Breast cancer metastasized to multiple sites   Liver metastases   Intractable nausea and vomiting   Cancer associated pain   Cancer related pain   Arterial hypotension   Hepatic cirrhosis   Elevated LFTs  Marzetta Board, MD  [facility] Pager [phone]. If 7PM-7AM, please contact night-coverage at [email or web address], password [facility] 12/27/2014, 12:56 PM  LOS: 2 days

## 2014-12-27 NOTE — Progress Notes (Signed)
Education given to pt and pt's family on handwashing

## 2014-12-28 DIAGNOSIS — K7290 Hepatic failure, unspecified without coma: Secondary | ICD-10-CM | POA: Insufficient documentation

## 2014-12-28 DIAGNOSIS — K7682 Hepatic encephalopathy: Secondary | ICD-10-CM | POA: Insufficient documentation

## 2014-12-28 DIAGNOSIS — R188 Other ascites: Secondary | ICD-10-CM

## 2014-12-28 LAB — CERULOPLASMIN: CERULOPLASMIN: 47 mg/dL (ref 18–53)

## 2014-12-28 LAB — CBC
HEMATOCRIT: 30.8 % — AB (ref 36.0–46.0)
HEMOGLOBIN: 9.7 g/dL — AB (ref 12.0–15.0)
MCH: 24.7 pg — ABNORMAL LOW (ref 26.0–34.0)
MCHC: 31.5 g/dL (ref 30.0–36.0)
MCV: 78.6 fL (ref 78.0–100.0)
Platelets: 145 10*3/uL — ABNORMAL LOW (ref 150–400)
RBC: 3.92 MIL/uL (ref 3.87–5.11)
RDW: 17 % — ABNORMAL HIGH (ref 11.5–15.5)
WBC: 5.4 10*3/uL (ref 4.0–10.5)

## 2014-12-28 LAB — BASIC METABOLIC PANEL
ANION GAP: 5 (ref 5–15)
BUN: 32 mg/dL — ABNORMAL HIGH (ref 6–23)
CO2: 19 mmol/L (ref 19–32)
Calcium: 9.2 mg/dL (ref 8.4–10.5)
Chloride: 114 mmol/L — ABNORMAL HIGH (ref 96–112)
Creatinine, Ser: 0.91 mg/dL (ref 0.50–1.10)
GFR calc Af Amer: 86 mL/min — ABNORMAL LOW (ref >90)
GFR calc non Af Amer: 74 mL/min — ABNORMAL LOW (ref >90)
Glucose, Bld: 96 mg/dL (ref 70–99)
Potassium: 3.9 mmol/L (ref 3.5–5.1)
Sodium: 138 mmol/L (ref 135–145)

## 2014-12-28 LAB — HEPATIC FUNCTION PANEL
ALBUMIN: 2.4 g/dL — AB (ref 3.5–5.2)
ALT: 122 U/L — ABNORMAL HIGH (ref 0–35)
AST: 502 U/L — AB (ref 0–37)
Alkaline Phosphatase: 758 U/L — ABNORMAL HIGH (ref 39–117)
Bilirubin, Direct: 3.2 mg/dL — ABNORMAL HIGH (ref 0.0–0.5)
Indirect Bilirubin: 1.6 mg/dL — ABNORMAL HIGH (ref 0.3–0.9)
Total Bilirubin: 4.8 mg/dL — ABNORMAL HIGH (ref 0.3–1.2)
Total Protein: 5.6 g/dL — ABNORMAL LOW (ref 6.0–8.3)

## 2014-12-28 LAB — AMMONIA: Ammonia: 108 umol/L — ABNORMAL HIGH (ref 11–32)

## 2014-12-28 LAB — PROTIME-INR
INR: 1.71 — ABNORMAL HIGH (ref 0.00–1.49)
PROTHROMBIN TIME: 20.2 s — AB (ref 11.6–15.2)

## 2014-12-28 MED ORDER — RIFAXIMIN 550 MG PO TABS
550.0000 mg | ORAL_TABLET | Freq: Two times a day (BID) | ORAL | Status: DC
  Administered 2014-12-28 – 2015-01-02 (×10): 550 mg via ORAL
  Filled 2014-12-28 (×16): qty 1

## 2014-12-28 MED ORDER — LIDOCAINE-PRILOCAINE 2.5-2.5 % EX CREA
TOPICAL_CREAM | CUTANEOUS | Status: DC | PRN
  Filled 2014-12-28: qty 5

## 2014-12-28 NOTE — Progress Notes (Addendum)
[facility] PROGRESS NOTE  Tammy Blanchard AJO:[id number] DOB: [birth year] DOA: 12/24/2014 PCP: Angelica Chessman, MD   HPI Patient with stage IV breast cancer metastasized aches prepped pelvis, bones and liver and carcinomatosis Patient is status post chemotherapy and lumpectomy. Patient is currently on oral chemotherapy. For the past 2 weeks she have been having worsening abdominal pain and generalized pain.  Subjective: - ongoing abdominal pain - paracentesis yesterday with 2.2 L fluid removal  Assessment/Plan:  Elevated liver function tests / ? Liver cirrhosis vs metastasis  - CT shows findings suggestive of cirrhosis.  Unclear etiology, appreciate GI input.  - Discussed with Dr. [name], plan for liver biopsy, appreciate interventional radiologist assistance, waiting on cytology from paracentesis  - elevated ammonia - started Lactulose, no improvement, add Rifaximin today  - Liver function tests unfortunately continued to get worse  Ascites - in the setting of liver disfunction - no evidence of SBP, high SAAG   Mild hepatic encephalopathy - ammonia levels not improving with lactulose, start Rifaximin  Intractable nausea, vomiting, and abdominal pain - CT without evidence of obstruction.  Abdominal CT also showed cholelithiasis, doubt cholecystitis. - CT of abdomen and pelvis also showed moderate volume ascites, obtained diagnostic paracentesis 2/24 - Supportive management   Dehydration - Due to above, nausea and vomiting.  Stage IV breast cancer with widespread metastatic disease - Management as per oncology  Essential hypertension  - Hypertension improved with IV fluids.  Patient does not appear to be on any antihypertensive medications at home.    Possible esophagitis  - Supportive management, patient started on IV Protonix.  Chronic pain syndrome/cancer associated pain  - IV pain medications as needed.    Acute encephalopathy - Multifactorial, may  be due to dehydration.  Head CT showed unchanged appearance of chronic white matter encephalomalacia, likely due to ischemia with diffuse bone metastasis.  Prophylaxis - Lovenox, Protonix  Code Status: Full code. Family Communication: family bedside Disposition Plan: Pending  Consultants:  Oncology - Dr. [name]  Procedures:  Head CT - 12/25/2014  CT abdomen and pelvis with contrast - 12/24/2014  Antibiotics:  None  Objective: Filed Vitals:   12/27/14 1509 12/27/14 1515 12/27/14 2115 12/28/14 0525  BP: 122/75 116/76 101/74 91/59  Pulse:   109 106  Temp:   97.5 F (36.4 C) 98.5 F (36.9 C)  TempSrc:   Oral Oral  Resp:   19 18  Height:      Weight:      SpO2:   98% 98%    Intake/Output Summary (Last 24 hours) at 12/28/14 1100 Last data filed at 12/28/14 0902  Gross per 24 hour  Intake    960 ml  Output    200 ml  Net    760 ml   Filed Weights   12/24/14 1858 12/25/14 0117  Weight: 66.225 kg (146 lb) 66.8 kg (147 lb 4.3 oz)   Exam: General: Awake, Oriented, No acute distress. HEENT: EOMI. Neck: Supple CV: S1 and S2 Lungs: Clear to ascultation bilaterally Abdomen: Soft, generalized tenderness, mild distention, +bowel sounds. Ext: Good pulses. Trace edema.  Data Reviewed: Basic Metabolic Panel:  Recent Labs Lab 12/24/14 1935 12/25/14 0230 12/26/14 0440 12/27/14 0515 12/28/14 0341  NA 136 137 141 141 138  K 3.7 3.9 4.1 4.0 3.9  CL 103 110 111 110 114*  CO2 22 21 21 19 19   GLUCOSE 114* 104* 100* 100* 96  BUN 34* 32* 30* 31* 32*  CREATININE 1.10 1.01 0.91  1.02 0.91  CALCIUM 10.2 8.9 9.3 9.6 9.2  MG  --  2.1  --   --   --   PHOS  --  3.8  --   --   --    Liver Function Tests:  Recent Labs Lab 12/24/14 1935 12/25/14 0230 12/26/14 0440 12/27/14 0515 12/28/14 0341  AST 397* 337* 349* 404* 502*  ALT 119* 105* 107* 114* 122*  ALKPHOS 836* 731* 730* 791* 758*  BILITOT 3.5* 3.4* 3.8* 4.8* 4.8*  PROT 6.6 6.0 5.7* 5.9* 5.6*  ALBUMIN 3.0*  2.6* 2.6* 2.7* 2.4*    Recent Labs Lab 12/24/14 1935  LIPASE 54    Recent Labs Lab 12/25/14 0016 12/28/14 0345  AMMONIA 76* 108*   CBC:  Recent Labs Lab 12/24/14 1935 12/25/14 0230 12/26/14 0440 12/27/14 0515 12/28/14 0341  WBC 5.2 3.8* 2.4* 4.6 5.4  HGB 10.3* 10.4* 14.1 9.8* 9.7*  HCT 31.6* 32.2* 44.8 31.3* 30.8*  MCV 77.3* 78.3 78.2 78.6 78.6  PLT 168 146* 93* 170 145*    Recent Results (from the past 240 hour(s))  Urine culture     Status: None   Collection Time: 12/25/14  1:53 AM  Result Value Ref Range Status   Specimen Description URINE, CLEAN CATCH  Final   Special Requests NONE  Final   Colony Count   Final    >=100,000 COLONIES/ML Performed at [facility]    Culture   Final    Multiple bacterial morphotypes present, none predominant. Suggest appropriate recollection if clinically indicated. Performed at [facility]    Report Status 12/26/2014 FINAL  Final  Clostridium Difficile by PCR     Status: None   Collection Time: 12/27/14  1:28 PM  Result Value Ref Range Status   C difficile by pcr NEGATIVE NEGATIVE Final    Comment: Performed at [facility]  Body fluid culture     Status: None (Preliminary result)   Collection Time: 12/27/14  3:42 PM  Result Value Ref Range Status   Specimen Description ASCITIC  Final   Special Requests NONE  Final   Gram Stain   Final    RARE WBC PRESENT, PREDOMINANTLY MONONUCLEAR NO ORGANISMS SEEN Performed at [facility]    Culture NO GROWTH Performed at [facility]   Final   Report Status PENDING  Incomplete     Studies: US Paracentesis  12/27/2014   INDICATION: Stage IV breast cancer, imaging findings suggestive of cirrhosis, ascites. Request is made for diagnostic and therapeutic paracentesis.  EXAM: ULTRASOUND-GUIDED DIAGNOSTIC AND THERAPEUTIC PARACENTESIS  COMPARISON:  None.  MEDICATIONS: None.  COMPLICATIONS: None immediate  TECHNIQUE: Informed written consent  was obtained from the patient after a discussion of the risks, benefits and alternatives to treatment. A timeout was performed prior to the initiation of the procedure.  Initial ultrasound scanning demonstrates a small to moderate amount of ascites within the left lower abdominal quadrant. The left lower abdomen was prepped and draped in the usual sterile fashion. 1% lidocaine was used for local anesthesia. Under direct ultrasound guidance, a 19 gauge, 10-cm, Yueh catheter was introduced. An ultrasound image was saved for documentation purposed. The paracentesis was performed. The catheter was removed and a dressing was applied. The patient tolerated the procedure well without immediate post procedural complication.  FINDINGS: A total of approximately 2.2 liters of slightly turbid, yellow fluid was removed. Samples were sent to the laboratory as requested by the clinical team.  IMPRESSION: Successful  ultrasound-guided diagnostic and therapeutic paracentesis yielding 2.2 liters of peritoneal fluid.  Read by: Rowe Robert, PA-C   Electronically Signed   By: Marybelle Killings M.D.   On: 12/27/2014 15:51    Scheduled Meds: . docusate sodium  100 mg Oral BID  . enoxaparin (LOVENOX) injection  40 mg Subcutaneous Q24H  . lactulose  10 g Oral TID  . nicotine  7 mg Transdermal Daily  . pantoprazole (PROTONIX) IV  40 mg Intravenous QHS  . senna  1 tablet Oral BID  . sodium chloride  3 mL Intravenous Q12H  . sucralfate  1 g Oral TID WC & HS   Continuous Infusions:    Active Problems:   Breast carcinoma metastatic to pelvis   Essential hypertension   Breast cancer metastasized to multiple sites   Liver metastases   Intractable nausea and vomiting   Cancer associated pain   Cancer related pain   Arterial hypotension   Hepatic cirrhosis   Elevated LFTs   Ascites  Time spent: 35 minutes, more than 50% in family discussions and coordinating care with radiology, pathology and oncology  Marzetta Board,  MD  [facility] Pager [phone]. If 7PM-7AM, please contact night-coverage at [email or web address], password [facility] 12/28/2014, 11:00 AM  LOS: 3 days

## 2014-12-28 NOTE — Progress Notes (Signed)
[facility] Progress Note  Subjective:   Had paracentesis yesterday, fluid results pending. Liver biopsy recommended, but IR is waiting for results of cytology/fluid from paracentesis before proceeding with liver bx.For past 2 weeks pt has had increasing abd pain. CT with findings suggestive of cirrhosis.   Objective:  Vital signs in last 24 hours: Temp:  [97.5 F (36.4 C)-98.5 F (36.9 C)] 98.5 F (36.9 C) (02/25 0525) Pulse Rate:  [106-109] 106 (02/25 0525) Resp:  [18-19] 18 (02/25 0525) BP: (91-122)/(59-76) 91/59 mmHg (02/25 0525) SpO2:  [98 %] 98 % (02/25 0525) Last BM Date: 12/28/14 General:   Alert,  Well-developed,    in NAD Heart:  Regular rate and rhythm; no murmurs Pulm;lungs clear Abdomen:  Soft, mild diffuse tenderness, Normal bowel sounds, without guarding, and without rebound.   Extremities:  Without edema. Neurologic:  Alert and  oriented x4;  grossly normal neurologically. Psych:  Alert and cooperative. Normal mood and affect.  Intake/Output from previous day: 02/24 0701 - 02/25 0700 In: 960 [P.O.:960] Out: 200 [Urine:200] Intake/Output this shift: Total I/O In: 240 [P.O.:240] Out: -   Lab Results:  Recent Labs  12/26/14 0440 12/27/14 0515 12/28/14 0341  WBC 2.4* 4.6 5.4  HGB 14.1 9.8* 9.7*  HCT 44.8 31.3* 30.8*  PLT 93* 170 145*   BMET  Recent Labs  12/26/14 0440 12/27/14 0515 12/28/14 0341  NA 141 141 138  K 4.1 4.0 3.9  CL 111 110 114*  CO2 21 19 19   GLUCOSE 100* 100* 96  BUN 30* 31* 32*  CREATININE 0.91 1.02 0.91  CALCIUM 9.3 9.6 9.2   LFT  Recent Labs  12/28/14 0341  PROT 5.6*  ALBUMIN 2.4*  AST 502*  ALT 122*  ALKPHOS 758*  BILITOT 4.8*  BILIDIR 3.2*  IBILI 1.6*   PT/INR  Recent Labs  12/28/14 0640  LABPROT 20.2*  INR 1.71*   C Diff negative 12/27/14  US Paracentesis  12/27/2014   INDICATION: Stage IV breast cancer, imaging findings suggestive of cirrhosis, ascites. Request is made for  diagnostic and therapeutic paracentesis.  EXAM: ULTRASOUND-GUIDED DIAGNOSTIC AND THERAPEUTIC PARACENTESIS  COMPARISON:  None.  MEDICATIONS: None.  COMPLICATIONS: None immediate  TECHNIQUE: Informed written consent was obtained from the patient after a discussion of the risks, benefits and alternatives to treatment. A timeout was performed prior to the initiation of the procedure.  Initial ultrasound scanning demonstrates a small to moderate amount of ascites within the left lower abdominal quadrant. The left lower abdomen was prepped and draped in the usual sterile fashion. 1% lidocaine was used for local anesthesia. Under direct ultrasound guidance, a 19 gauge, 10-cm, Yueh catheter was introduced. An ultrasound image was saved for documentation purposed. The paracentesis was performed. The catheter was removed and a dressing was applied. The patient tolerated the procedure well without immediate post procedural complication.  FINDINGS: A total of approximately 2.2 liters of slightly turbid, yellow fluid was removed. Samples were sent to the laboratory as requested by the clinical team.  IMPRESSION: Successful ultrasound-guided diagnostic and therapeutic paracentesis yielding 2.2 liters of peritoneal fluid.  Read by: Rowe Robert, PA-C   Electronically Signed   By: Marybelle Killings M.D.   On: 12/27/2014 15:51    ASSESSMENT/PLAN:    58. 49 year old female with stage IV breast cancer involving bones, uterus, ovaries, abdomen (carcinomatosis), on chemotherapy   2. Abnormal LFTs (mixed pattern). CTscan and MRI suggest cirrhosis which is a new finding. She has  also developed ascites. No varices on EGD less than a year ago. Assuming she has underlying cirrhosis the etiology is unclear. Autoimmune / genetic markers of liver disease pending. Could cirrhosis be result of chemo agent? Liver biopsy to be performed pending results from fluid sent from paracentesis.. Viral hepatitis studies negative. Patient having  intermittent right mid abdominal pain over last several weeks which may or may not be liver related. If ascites from cirrhosis then she could have SBP though would expect more pain and tenderness. T bili 4.8. INR 1.71,protime 20.2.  3. Loose stool. Difficult to sort this out. She had loose stool a couple of months ago but it resolved and constipation followed. Now with several episodes of loose stool over last couple of days. This may be mult-factorial (chemo, dietary, other medications, etc...). Stool for c diff neg yesterday.  3. Recurrent dysphagia. Hx of esophageal stricture dilated May 2015. We can repeat EGD with dilation when acute medical issues improve (this will likely be done outpatient). There is thickening of distal esophagus on CTscan which could be esophagitis or hiatal hernia.      LOS: 3 days   Blanchard, Tammy Ping 12/28/2014, Pager [phone]     Attending physician's note   I have taken an interval history, reviewed the chart and examined the patient. I agree with the Advanced Practitioner's note, impression and recommendations. Ascitic fluid shows high SAAG c/w ascites due to portal hypertension and a low PMN count which excludes SBP. Ascites cytology is pending. Liver biopsy on hold per IR however we feel a liver biopsy is important to further evaluate her elevated LFTs and cirrhosis even if ascites cytology is positive. Ferritin is markedly elevated but his could be elevated for several reasons, not necessarily indicative of hemochromatosis. Other hepatic serologies that have returned so far are negative.   Pricilla Riffle. Fuller Plan, MD [facility]

## 2014-12-29 ENCOUNTER — Inpatient Hospital Stay (HOSPITAL_COMMUNITY): Payer: Medicaid Other

## 2014-12-29 LAB — COMPREHENSIVE METABOLIC PANEL
ALT: 114 U/L — AB (ref 0–35)
AST: 456 U/L — ABNORMAL HIGH (ref 0–37)
Albumin: 2.4 g/dL — ABNORMAL LOW (ref 3.5–5.2)
Alkaline Phosphatase: 711 U/L — ABNORMAL HIGH (ref 39–117)
Anion gap: 11 (ref 5–15)
BUN: 32 mg/dL — AB (ref 6–23)
CALCIUM: 9.7 mg/dL (ref 8.4–10.5)
CO2: 18 mmol/L — ABNORMAL LOW (ref 19–32)
CREATININE: 0.89 mg/dL (ref 0.50–1.10)
Chloride: 110 mmol/L (ref 96–112)
GFR, EST AFRICAN AMERICAN: 88 mL/min — AB (ref >90)
GFR, EST NON AFRICAN AMERICAN: 76 mL/min — AB (ref >90)
Glucose, Bld: 105 mg/dL — ABNORMAL HIGH (ref 70–99)
Potassium: 4 mmol/L (ref 3.5–5.1)
Sodium: 139 mmol/L (ref 135–145)
Total Bilirubin: 5.6 mg/dL — ABNORMAL HIGH (ref 0.3–1.2)
Total Protein: 5.6 g/dL — ABNORMAL LOW (ref 6.0–8.3)

## 2014-12-29 LAB — CBC
HCT: 31.3 % — ABNORMAL LOW (ref 36.0–46.0)
Hemoglobin: 10 g/dL — ABNORMAL LOW (ref 12.0–15.0)
MCH: 24.9 pg — AB (ref 26.0–34.0)
MCHC: 31.9 g/dL (ref 30.0–36.0)
MCV: 78.1 fL (ref 78.0–100.0)
PLATELETS: 145 10*3/uL — AB (ref 150–400)
RBC: 4.01 MIL/uL (ref 3.87–5.11)
RDW: 17.3 % — AB (ref 11.5–15.5)
WBC: 6.1 10*3/uL (ref 4.0–10.5)

## 2014-12-29 MED ORDER — MIDAZOLAM HCL 2 MG/2ML IJ SOLN
INTRAMUSCULAR | Status: AC
  Filled 2014-12-29: qty 6

## 2014-12-29 MED ORDER — FENTANYL CITRATE 0.05 MG/ML IJ SOLN
INTRAMUSCULAR | Status: AC
  Filled 2014-12-29: qty 4

## 2014-12-29 MED ORDER — IOHEXOL 300 MG/ML  SOLN
50.0000 mL | Freq: Once | INTRAMUSCULAR | Status: AC | PRN
  Administered 2014-12-29: 50 mL via INTRAVENOUS

## 2014-12-29 MED ORDER — LIDOCAINE HCL 1 % IJ SOLN
INTRAMUSCULAR | Status: AC
  Filled 2014-12-29: qty 20

## 2014-12-29 MED ORDER — MIDAZOLAM HCL 2 MG/2ML IJ SOLN
INTRAMUSCULAR | Status: AC | PRN
  Administered 2014-12-29 (×2): 0.5 mg via INTRAVENOUS
  Administered 2014-12-29: 1 mg via INTRAVENOUS
  Administered 2014-12-29 (×2): 0.5 mg via INTRAVENOUS

## 2014-12-29 MED ORDER — GLYCOPYRROLATE 1 MG PO TABS
2.0000 mg | ORAL_TABLET | Freq: Two times a day (BID) | ORAL | Status: DC
  Administered 2014-12-31 – 2015-01-01 (×3): 2 mg via ORAL
  Filled 2014-12-29 (×12): qty 2

## 2014-12-29 MED ORDER — FENTANYL CITRATE 0.05 MG/ML IJ SOLN
INTRAMUSCULAR | Status: AC | PRN
  Administered 2014-12-29: 50 ug via INTRAVENOUS
  Administered 2014-12-29: 25 ug via INTRAVENOUS
  Administered 2014-12-29: 50 ug via INTRAVENOUS
  Administered 2014-12-29: 25 ug via INTRAVENOUS

## 2014-12-29 NOTE — Progress Notes (Signed)
Subjective: Patient known to our service from recent paracentesis which revealed malignant cells consistent with metastatic breast cancer. She continues to have some generalized abdominal discomfort, as well as some intermittent nausea and vomiting. We are asked to see patient again today for transjugular liver biopsy.  Objective: Vital signs in last 24 hours: Temp:  [98.5 F (36.9 C)-99.9 F (37.7 C)] 98.5 F (36.9 C) (02/26 1245) Pulse Rate:  [107-110] 109 (02/26 1245) Resp:  [16-20] 16 (02/26 1245) BP: (84-98)/(57-69) 84/57 mmHg (02/26 1245) SpO2:  [98 %-100 %] 100 % (02/26 1245) Last BM Date: 12/29/14  Intake/Output from previous day: 02/25 0701 - 02/26 0700 In: 720 [P.O.:720] Out: 300 [Urine:300] Intake/Output this shift:    Patient awake, alert ,answers questions appropriately; chest with slightly diminished breath sounds at the bases. Heart -slightly tachycardic but regular rhythm. Clean intact right chest wall Port-A-Cath;  Abdomen soft, mildly distended, few bowel sounds, mild generalized tenderness to palpation. Extremities with full range of motion, trace edema.   Lab Results:   Recent Labs  12/28/14 0341 12/29/14 0515  WBC 5.4 6.1  HGB 9.7* 10.0*  HCT 30.8* 31.3*  PLT 145* 145*   BMET  Recent Labs  12/28/14 0341 12/29/14 0515  NA 138 139  K 3.9 4.0  CL 114* 110  CO2 19 18*  GLUCOSE 96 105*  BUN 32* 32*  CREATININE 0.91 0.89  CALCIUM 9.2 9.7   PT/INR  Recent Labs  12/28/14 0640  LABPROT 20.2*  INR 1.71*   ABG No results for input(s): PHART, HCO3 in the last 72 hours.  Invalid input(s): PCO2, PO2  Studies/Results: US Paracentesis  12/27/2014   INDICATION: Stage IV breast cancer, imaging findings suggestive of cirrhosis, ascites. Request is made for diagnostic and therapeutic paracentesis.  EXAM: ULTRASOUND-GUIDED DIAGNOSTIC AND THERAPEUTIC PARACENTESIS  COMPARISON:  None.  MEDICATIONS: None.  COMPLICATIONS: None immediate  TECHNIQUE:  Informed written consent was obtained from the patient after a discussion of the risks, benefits and alternatives to treatment. A timeout was performed prior to the initiation of the procedure.  Initial ultrasound scanning demonstrates a small to moderate amount of ascites within the left lower abdominal quadrant. The left lower abdomen was prepped and draped in the usual sterile fashion. 1% lidocaine was used for local anesthesia. Under direct ultrasound guidance, a 19 gauge, 10-cm, Yueh catheter was introduced. An ultrasound image was saved for documentation purposed. The paracentesis was performed. The catheter was removed and a dressing was applied. The patient tolerated the procedure well without immediate post procedural complication.  FINDINGS: A total of approximately 2.2 liters of slightly turbid, yellow fluid was removed. Samples were sent to the laboratory as requested by the clinical team.  IMPRESSION: Successful ultrasound-guided diagnostic and therapeutic paracentesis yielding 2.2 liters of peritoneal fluid.  Read by: Rowe Robert, PA-C   Electronically Signed   By: Marybelle Killings M.D.   On: 12/27/2014 15:51    Anti-infectives: Anti-infectives    Start     Dose/Rate Route Frequency Ordered Stop   12/28/14 1400  rifaximin (XIFAXAN) tablet 550 mg     550 mg Oral 2 times daily 12/28/14 1322        Assessment/Plan: Patient with history of stage IV breast carcinoma, malignant ascites, elevated LFTs and imaging suggestive of cirrhosis. Request now received for transjugular liver biopsy for further evaluation. Details/risks of procedure including but not limited to internal bleeding and infection discussed with patient and family with their understanding and consent. Procedure is tentatively  planned for later this afternoon. BP still a little soft so will require minimal conscious sedation.  LOS: 4 days   Approximately 15 minutes were spent evaluating patient for transjugular liver  biopsy. ALLRED,D [facility] 12/29/2014

## 2014-12-29 NOTE — Procedures (Signed)
Interventional Radiology Procedure Note  Procedure: Transjugular liver biopsy.  Left sided biopsy, as right IJ is occluded at the site of the right port catheter entry.  6 core retrieved.  Placed into formalin and into saline.  Complications: No immediate Recommendations:  - Ok to shower tomorrow  - Routine care   Signed,  Dulcy Fanny. Earleen Newport, DO

## 2014-12-29 NOTE — Progress Notes (Signed)
Spoke with pt concerning Home Health. She and family selected [facility] referral given to in house rep.

## 2014-12-29 NOTE — Progress Notes (Signed)
[facility] Progress Note  Subjective:   Still with some diffuse abd pain.     Objective:  Vital signs in last 24 hours: Temp:  [98.9 F (37.2 C)-99.9 F (37.7 C)] 99.2 F (37.3 C) (02/26 0650) Pulse Rate:  [107-110] 108 (02/26 0650) Resp:  [18-20] 18 (02/26 0650) BP: (90-98)/(62-69) 90/66 mmHg (02/26 0650) SpO2:  [98 %-100 %] 100 % (02/26 0650) Last BM Date: 12/29/14 General:   Alert,  Well-developed,    in NAD Heart:  Regular rate and rhythm; no murmurs Pulm;lungs clear Abdomen: Soft, mild diffuse tenderness, Normal bowel sounds, without guarding, and without rebound.  Extremities:  Without edema. Neurologic:  Alert and  oriented x4;  grossly normal neurologically. Psych:  Alert and cooperative. Normal mood and affect.  Intake/Output from previous day: 02/25 0701 - 02/26 0700 In: 720 [P.O.:720] Out: 300 [Urine:300] Intake/Output this shift:    Lab Results:  Recent Labs  12/27/14 0515 12/28/14 0341 12/29/14 0515  WBC 4.6 5.4 6.1  HGB 9.8* 9.7* 10.0*  HCT 31.3* 30.8* 31.3*  PLT 170 145* 145*   BMET  Recent Labs  12/27/14 0515 12/28/14 0341 12/29/14 0515  NA 141 138 139  K 4.0 3.9 4.0  CL 110 114* 110  CO2 19 19 18*  GLUCOSE 100* 96 105*  BUN 31* 32* 32*  CREATININE 1.02 0.91 0.89  CALCIUM 9.6 9.2 9.7   LFT  Recent Labs  12/28/14 0341 12/29/14 0515  PROT 5.6* 5.6*  ALBUMIN 2.4* 2.4*  AST 502* 456*  ALT 122* 114*  ALKPHOS 758* 711*  BILITOT 4.8* 5.6*  BILIDIR 3.2*  --   IBILI 1.6*  --    PT/INR  Recent Labs  12/28/14 0640  LABPROT 20.2*  INR 1.71*      US Paracentesis  12/27/2014   INDICATION: Stage IV breast cancer, imaging findings suggestive of cirrhosis, ascites. Request is made for diagnostic and therapeutic paracentesis.  EXAM: ULTRASOUND-GUIDED DIAGNOSTIC AND THERAPEUTIC PARACENTESIS  COMPARISON:  None.  MEDICATIONS: None.  COMPLICATIONS: None immediate  TECHNIQUE: Informed written consent was obtained  from the patient after a discussion of the risks, benefits and alternatives to treatment. A timeout was performed prior to the initiation of the procedure.  Initial ultrasound scanning demonstrates a small to moderate amount of ascites within the left lower abdominal quadrant. The left lower abdomen was prepped and draped in the usual sterile fashion. 1% lidocaine was used for local anesthesia. Under direct ultrasound guidance, a 19 gauge, 10-cm, Yueh catheter was introduced. An ultrasound image was saved for documentation purposed. The paracentesis was performed. The catheter was removed and a dressing was applied. The patient tolerated the procedure well without immediate post procedural complication.  FINDINGS: A total of approximately 2.2 liters of slightly turbid, yellow fluid was removed. Samples were sent to the laboratory as requested by the clinical team.  IMPRESSION: Successful ultrasound-guided diagnostic and therapeutic paracentesis yielding 2.2 liters of peritoneal fluid.  Read by: Rowe Robert, PA-C   Electronically Signed   By: Marybelle Killings M.D.   On: 12/27/2014 15:51    ASSESSMENT/PLAN:    89. 49 year old female with stage IV breast cancer involving bones, uterus, ovaries, abdomen (carcinomatosis), on chemotherapy   2. Abnormal LFTs (mixed pattern). CTscan and MRI suggest cirrhosis which is a new finding. She has ascites. No varices on EGD less than a year ago.Patient having intermittent right mid abdominal pain over last several weeks. Ascitic fluid shows high SAAG c/w  ascites due to portal hypertension and a low PMN count which excludes SBP. Liver biopsy on hold per IR however we feel a liver biopsy is important to further evaluate her elevated LFTs and cirrhosis. Ascites cytology is positive for carcinoma.   3. Loose stools. Likely from lactulose. Other laxatives and stool softeners were discontinued. She had loose stool a couple of months ago but it resolved and constipation followed.  Now with several episodes of loose stool over last couple of days. Stool for C diff neg on 12/27/14.  4. Recurrent dysphagia. Hx of esophageal stricture dilated May 2015. Pt says she had significant improvement after dilation. We can consider repeat EGD with dilation when acute medical issues improve. There is thickening of distal esophagus on CTscan which could be esophagitis.      LOS: 4 days   Tammy Blanchard, Tammy Blanchard 12/29/2014, Pager [phone]     Attending physician's note   I have taken an interval history, reviewed the chart and examined the patient. I agree with the Advanced Practitioner's note, impression and recommendations. Having increased abdominal pain today-etiology unclear. Trial of Robinul bid. Continue lactulose and DC other standing laxatives, stool softeners. Ascites cytology is positive for metastatic carcinoma consistent with breast. LFTs slightly improved. Awaiting liver biopsy. Hepatic serologies negative so far except ferritin. Check genetic hemochromatosis markers for elevated ferritin.   Tammy Blanchard. Fuller Plan, MD [facility]

## 2014-12-29 NOTE — Progress Notes (Signed)
[facility] PROGRESS NOTE  Tammy Blanchard BOF:[id number] DOB: [birth year] DOA: 12/24/2014 PCP: Angelica Chessman, MD   HPI Patient with stage IV breast cancer metastasized aches prepped pelvis, bones and liver and carcinomatosis Patient is status post chemotherapy and lumpectomy. Patient is currently on oral chemotherapy. For the past 2 weeks she have been having worsening abdominal pain and generalized pain.  Subjective: - ongoing abdominal pain - wants to go home  Assessment/Plan:  Elevated liver function tests / ? Liver cirrhosis vs metastasis  - CT shows findings suggestive of cirrhosis.  Unclear etiology, appreciate GI input.  - Discussed with Dr. [name], plan for liver biopsy, appreciate interventional radiologist assistance,  - elevated ammonia - on Lactulose and Rifaximin, mental status better   Ascites - in the setting of liver disfunction - no evidence of SBP, high SAAG  - cytology with METASTATIC CARCINOMA CONSISTENT WITH BREAST.  Mild hepatic encephalopathy - lactulose and Rifaximin  Intractable nausea, vomiting, and abdominal pain - CT without evidence of obstruction.  Abdominal CT also showed cholelithiasis, doubt cholecystitis. - CT of abdomen and pelvis also showed moderate volume ascites, obtained diagnostic paracentesis 2/24  Dehydration - Due to above, nausea and vomiting. Improving  Stage IV breast cancer with widespread metastatic disease - Management as per oncology  Essential hypertension  - Hypertension improved with IV fluids.  Patient does not appear to be on any antihypertensive medications at home.    Possible esophagitis  - Supportive management, patient started on IV Protonix.  Chronic pain syndrome/cancer associated pain  - IV pain medications as needed.    Acute encephalopathy - Multifactorial, may be due to dehydration.  Head CT showed unchanged appearance of chronic white matter encephalomalacia, likely due to ischemia  with diffuse bone metastasis.  Prophylaxis - Lovenox, Protonix  Code Status: Full code. Family Communication: family bedside Disposition Plan: Pending  Consultants:  Oncology - Dr. [name]  Procedures:  Head CT - 12/25/2014  CT abdomen and pelvis with contrast - 12/24/2014  Antibiotics:  None  Objective: Filed Vitals:   12/28/14 0525 12/28/14 1344 12/28/14 2118 12/29/14 0650  BP: 91/59 98/69 92/62  90/66  Pulse: 106 107 110 108  Temp: 98.5 F (36.9 C) 98.9 F (37.2 C) 99.9 F (37.7 C) 99.2 F (37.3 C)  TempSrc: Oral Oral Oral Oral  Resp: 18 20 20 18   Height:      Weight:      SpO2: 98% 99% 98% 100%    Intake/Output Summary (Last 24 hours) at 12/29/14 1147 Last data filed at 12/29/14 0900  Gross per 24 hour  Intake    480 ml  Output    300 ml  Net    180 ml   Filed Weights   12/24/14 1858 12/25/14 0117  Weight: 66.225 kg (146 lb) 66.8 kg (147 lb 4.3 oz)   Exam: General: Awake, Oriented, No acute distress. HEENT: EOMI. Neck: Supple CV: S1 and S2 Lungs: Clear to ascultation bilaterally Abdomen: Soft, generalized tenderness, mild distention, +bowel sounds. Ext: Good pulses. Trace edema.  Data Reviewed: Basic Metabolic Panel:  Recent Labs Lab 12/25/14 0230 12/26/14 0440 12/27/14 0515 12/28/14 0341 12/29/14 0515  NA 137 141 141 138 139  K 3.9 4.1 4.0 3.9 4.0  CL 110 111 110 114* 110  CO2 21 21 19 19  18*  GLUCOSE 104* 100* 100* 96 105*  BUN 32* 30* 31* 32* 32*  CREATININE 1.01 0.91 1.02 0.91 0.89  CALCIUM 8.9 9.3 9.6 9.2 9.7  MG  2.1  --   --   --   --   PHOS 3.8  --   --   --   --    Liver Function Tests:  Recent Labs Lab 12/25/14 0230 12/26/14 0440 12/27/14 0515 12/28/14 0341 12/29/14 0515  AST 337* 349* 404* 502* 456*  ALT 105* 107* 114* 122* 114*  ALKPHOS 731* 730* 791* 758* 711*  BILITOT 3.4* 3.8* 4.8* 4.8* 5.6*  PROT 6.0 5.7* 5.9* 5.6* 5.6*  ALBUMIN 2.6* 2.6* 2.7* 2.4* 2.4*    Recent Labs Lab 12/24/14 1935  LIPASE 54      Recent Labs Lab 12/25/14 0016 12/28/14 0345  AMMONIA 76* 108*   CBC:  Recent Labs Lab 12/25/14 0230 12/26/14 0440 12/27/14 0515 12/28/14 0341 12/29/14 0515  WBC 3.8* 2.4* 4.6 5.4 6.1  HGB 10.4* 14.1 9.8* 9.7* 10.0*  HCT 32.2* 44.8 31.3* 30.8* 31.3*  MCV 78.3 78.2 78.6 78.6 78.1  PLT 146* 93* 170 145* 145*    Recent Results (from the past 240 hour(s))  Urine culture     Status: None   Collection Time: 12/25/14  1:53 AM  Result Value Ref Range Status   Specimen Description URINE, CLEAN CATCH  Final   Special Requests NONE  Final   Colony Count   Final    >=100,000 COLONIES/ML Performed at [facility]    Culture   Final    Multiple bacterial morphotypes present, none predominant. Suggest appropriate recollection if clinically indicated. Performed at [facility]    Report Status 12/26/2014 FINAL  Final  Clostridium Difficile by PCR     Status: None   Collection Time: 12/27/14  1:28 PM  Result Value Ref Range Status   C difficile by pcr NEGATIVE NEGATIVE Final    Comment: Performed at [facility]  Body fluid culture     Status: None (Preliminary result)   Collection Time: 12/27/14  3:42 PM  Result Value Ref Range Status   Specimen Description ASCITIC  Final   Special Requests NONE  Final   Gram Stain   Final    RARE WBC PRESENT, PREDOMINANTLY MONONUCLEAR NO ORGANISMS SEEN Performed at [facility]    Culture NO GROWTH Performed at [facility]   Final   Report Status PENDING  Incomplete     Studies: US Paracentesis  12/27/2014   INDICATION: Stage IV breast cancer, imaging findings suggestive of cirrhosis, ascites. Request is made for diagnostic and therapeutic paracentesis.  EXAM: ULTRASOUND-GUIDED DIAGNOSTIC AND THERAPEUTIC PARACENTESIS  COMPARISON:  None.  MEDICATIONS: None.  COMPLICATIONS: None immediate  TECHNIQUE: Informed written consent was obtained from the patient after a discussion of the risks, benefits  and alternatives to treatment. A timeout was performed prior to the initiation of the procedure.  Initial ultrasound scanning demonstrates a small to moderate amount of ascites within the left lower abdominal quadrant. The left lower abdomen was prepped and draped in the usual sterile fashion. 1% lidocaine was used for local anesthesia. Under direct ultrasound guidance, a 19 gauge, 10-cm, Yueh catheter was introduced. An ultrasound image was saved for documentation purposed. The paracentesis was performed. The catheter was removed and a dressing was applied. The patient tolerated the procedure well without immediate post procedural complication.  FINDINGS: A total of approximately 2.2 liters of slightly turbid, yellow fluid was removed. Samples were sent to the laboratory as requested by the clinical team.  IMPRESSION: Successful ultrasound-guided diagnostic and therapeutic paracentesis yielding 2.2 liters of peritoneal  fluid.  Read by: Rowe Robert, PA-C   Electronically Signed   By: Marybelle Killings M.D.   On: 12/27/2014 15:51    Scheduled Meds: . enoxaparin (LOVENOX) injection  40 mg Subcutaneous Q24H  . glycopyrrolate  2 mg Oral BID  . lactulose  10 g Oral TID  . nicotine  7 mg Transdermal Daily  . pantoprazole (PROTONIX) IV  40 mg Intravenous QHS  . rifaximin  550 mg Oral BID  . sodium chloride  3 mL Intravenous Q12H  . sucralfate  1 g Oral TID WC & HS   Continuous Infusions:    Active Problems:   Breast carcinoma metastatic to pelvis   Essential hypertension   Breast cancer metastasized to multiple sites   Liver metastases   Intractable nausea and vomiting   Cancer associated pain   Cancer related pain   Arterial hypotension   Hepatic cirrhosis   Elevated LFTs   Ascites   Encephalopathy, hepatic  Time spent: 35 minutes, more than 50% in family discussions and coordinating care with radiology, pathology and oncology  Marzetta Board, MD  [facility] Pager [phone]. If  7PM-7AM, please contact night-coverage at [email or web address], password [facility] 12/29/2014, 11:47 AM  LOS: 4 days

## 2014-12-30 LAB — COMPREHENSIVE METABOLIC PANEL
ALK PHOS: 763 U/L — AB (ref 39–117)
ALT: 113 U/L — AB (ref 0–35)
AST: 480 U/L — AB (ref 0–37)
Albumin: 2.4 g/dL — ABNORMAL LOW (ref 3.5–5.2)
Anion gap: 9 (ref 5–15)
BUN: 39 mg/dL — AB (ref 6–23)
CALCIUM: 9.9 mg/dL (ref 8.4–10.5)
CO2: 18 mmol/L — AB (ref 19–32)
Chloride: 112 mmol/L (ref 96–112)
Creatinine, Ser: 1 mg/dL (ref 0.50–1.10)
GFR calc non Af Amer: 66 mL/min — ABNORMAL LOW (ref >90)
GFR, EST AFRICAN AMERICAN: 77 mL/min — AB (ref >90)
Glucose, Bld: 101 mg/dL — ABNORMAL HIGH (ref 70–99)
Potassium: 4.4 mmol/L (ref 3.5–5.1)
SODIUM: 139 mmol/L (ref 135–145)
Total Bilirubin: 6.7 mg/dL — ABNORMAL HIGH (ref 0.3–1.2)
Total Protein: 5.5 g/dL — ABNORMAL LOW (ref 6.0–8.3)

## 2014-12-30 LAB — CBC
HCT: 31.3 % — ABNORMAL LOW (ref 36.0–46.0)
HEMOGLOBIN: 10 g/dL — AB (ref 12.0–15.0)
MCH: 24.6 pg — AB (ref 26.0–34.0)
MCHC: 31.9 g/dL (ref 30.0–36.0)
MCV: 77.1 fL — ABNORMAL LOW (ref 78.0–100.0)
Platelets: 118 10*3/uL — ABNORMAL LOW (ref 150–400)
RBC: 4.06 MIL/uL (ref 3.87–5.11)
RDW: 17.7 % — AB (ref 11.5–15.5)
WBC: 7 10*3/uL (ref 4.0–10.5)

## 2014-12-30 LAB — AMMONIA: AMMONIA: 71 umol/L — AB (ref 11–32)

## 2014-12-30 MED ORDER — PANTOPRAZOLE SODIUM 40 MG PO TBEC
40.0000 mg | DELAYED_RELEASE_TABLET | Freq: Every day | ORAL | Status: DC
  Administered 2014-12-30 – 2015-01-01 (×3): 40 mg via ORAL
  Filled 2014-12-30 (×3): qty 1

## 2014-12-30 NOTE — Progress Notes (Signed)
.  Pt refusing all medications this morning.  I explained importance of each medication; she continued to refuse to take anything until she talks to the MD.  MD made aware.

## 2014-12-30 NOTE — Progress Notes (Signed)
Subjective: Feeling fine, but she appears drowsy.  Objective: Vital signs in last 24 hours: Temp:  [98.2 F (36.8 C)-98.5 F (36.9 C)] 98.5 F (36.9 C) (02/27 0602) Pulse Rate:  [105-113] 113 (02/27 0602) Resp:  [16-35] 22 (02/27 0602) BP: (84-120)/(57-98) 94/64 mmHg (02/27 0602) SpO2:  [97 %-100 %] 97 % (02/27 0602) Last BM Date: 12/29/14  Intake/Output from previous day:   Intake/Output this shift:    General appearance: sleepy, responsive. GI: soft, distended with ascites  Lab Results:  Recent Labs  12/28/14 0341 12/29/14 0515 12/30/14 0520  WBC 5.4 6.1 7.0  HGB 9.7* 10.0* 10.0*  HCT 30.8* 31.3* 31.3*  PLT 145* 145* 118*   BMET  Recent Labs  12/28/14 0341 12/29/14 0515 12/30/14 0520  NA 138 139 139  K 3.9 4.0 4.4  CL 114* 110 112  CO2 19 18* 18*  GLUCOSE 96 105* 101*  BUN 32* 32* 39*  CREATININE 0.91 0.89 1.00  CALCIUM 9.2 9.7 9.9   LFT  Recent Labs  12/28/14 0341  12/30/14 0520  PROT 5.6*  < > 5.5*  ALBUMIN 2.4*  < > 2.4*  AST 502*  < > 480*  ALT 122*  < > 113*  ALKPHOS 758*  < > 763*  BILITOT 4.8*  < > 6.7*  BILIDIR 3.2*  --   --   IBILI 1.6*  --   --   < > = values in this interval not displayed. PT/INR  Recent Labs  12/28/14 0640  LABPROT 20.2*  INR 1.71*   Hepatitis Panel No results for input(s): HEPBSAG, HCVAB, HEPAIGM, HEPBIGM in the last 72 hours. C-Diff No results for input(s): CDIFFTOX in the last 72 hours. Fecal Lactopherrin No results for input(s): FECLLACTOFRN in the last 72 hours.  Studies/Results: Ir Transcatheter Bx  12/29/2014   CLINICAL DATA:  49 year old female with a history of metastatic breast carcinoma.  She has had rapid deterioration of her liver, with imaging differential diagnosis of cirrhosis or potentially diffuse breast metastases. She has been referred for liver biopsy, and given her malignant ascites, the biopsy will be done trans jugular.  EXAM: ULTRASOUND GUIDANCE FOR VASCULAR ACCESS  FLUOROSCOPIC  GUIDANCE FOR TRANSJUGULAR LIVER BIOPSY.  Date:  2/26/20162/26/2016 7:04 pm  FLUOROSCOPY TIME:  7 MINUTES, 36 SECONDS 169 mGy  MEDICATIONS AND MEDICAL HISTORY: 3.0 mg Versed, 150 mcg fentanyl  ANESTHESIA/SEDATION: 45 minutes  CONTRAST:  12mL OMNIPAQUE IOHEXOL 300 MG/ML  SOLN  COMPLICATIONS: None  PROCEDURE: Informed consent was obtained from the patient following explanation of the procedure, risks, benefits and alternatives. The patient understands, agrees and consents for the procedure. All questions were addressed. A time out was performed.  Maximal barrier sterile technique utilized including caps, mask, sterile gowns, sterile gloves, large sterile drape, hand hygiene, and betadine prep.  The patient's right neck was prepped and draped in the usual sterile fashion. Ultrasound survey demonstrated a patent right internal jugular vein above the level of the entry of a indwelling right port catheter into the IJ vein. Once access was achieved with ultrasound guidance, the vein was found to be occluded as there was inability to pass the wire.  The patient's left neck was then prepped and draped in usual sterile fashion. Skin and subcutaneous tissues were infiltrated 1% lidocaine. Ultrasound guidance was used access the left internal jugular vein. 018 micro wire was passed into the right heart under fluoroscopy.  Incision was made on the needle on the needle was removed over the wire.  Micropuncture set was then placed over the micro wire. The inner dilator and cannula were removed along with the wire, and an 035 Bentson wire was placed into the right heart. A 9 French short sheath was then placed over the wire. Through the short sheath, the Bentson wire and a Kumpe E were used to navigate into the IVC. An 53 Bentson wire was then placed into the IVC. Dilation of the tissue tract with an 35 French dilator was performed after removing the short sheath, and then a 11 [place] TIPS sheath was placed into the right atrium. A  Kumpe the catheter and Bentson wire were then navigated into the right hepatic vein. Venogram was performed.  A stiff Amplatz wire was then advanced into the right hepatic vein. The Kumpe the catheter was removed, and then the guide sheath for the transjugular biopsied sat was advanced into the right hepatic vein. We confirmed that the catheter was lodged against the side wall with a small amount of contrast infusion, and then several biopsies were retrieved. Three samples were placed into formalin and 3 samples were placed into saline.  Once we had retrieved adequate tissue, all wires catheters and sheaths were we removed.  Manual compression was used for hemostasis.  The patient tolerated the procedure well and remained hemodynamically stable throughout.  No complications were encountered and no significant blood loss was encountered.  Marland Kitchen  FINDINGS: The right internal jugular vein is patent above the entry of right chest wall port catheter, below this and at the site of the catheter entry the vein is occluded.  Patent left IJ vein.  Confirmation of catheter tip within the right hepatic vein before biopsy retrieved.  At least 3 tissue biopsy were sent in both formalin and saline to the pathology lab.  IMPRESSION: Status post transjugular liver biopsy, with tissue specimen sent to pathology for complete histopathologic analysis.  Signed,  Dulcy Fanny. Earleen Newport, DO  [facility]  [facility]   Electronically Signed   By: Corrie Mckusick D.O.   On: 12/29/2014 19:19   Ir US Guide Vasc Access Left  12/29/2014   CLINICAL DATA:  49 year old female with a history of metastatic breast carcinoma.  She has had rapid deterioration of her liver, with imaging differential diagnosis of cirrhosis or potentially diffuse breast metastases. She has been referred for liver biopsy, and given her malignant ascites, the biopsy will be done trans jugular.  EXAM: ULTRASOUND GUIDANCE FOR VASCULAR  ACCESS  FLUOROSCOPIC GUIDANCE FOR TRANSJUGULAR LIVER BIOPSY.  Date:  2/26/20162/26/2016 7:04 pm  FLUOROSCOPY TIME:  7 MINUTES, 36 SECONDS 169 mGy  MEDICATIONS AND MEDICAL HISTORY: 3.0 mg Versed, 150 mcg fentanyl  ANESTHESIA/SEDATION: 45 minutes  CONTRAST:  21mL OMNIPAQUE IOHEXOL 300 MG/ML  SOLN  COMPLICATIONS: None  PROCEDURE: Informed consent was obtained from the patient following explanation of the procedure, risks, benefits and alternatives. The patient understands, agrees and consents for the procedure. All questions were addressed. A time out was performed.  Maximal barrier sterile technique utilized including caps, mask, sterile gowns, sterile gloves, large sterile drape, hand hygiene, and betadine prep.  The patient's right neck was prepped and draped in the usual sterile fashion. Ultrasound survey demonstrated a patent right internal jugular vein above the level of the entry of a indwelling right port catheter into the IJ vein. Once access was achieved with ultrasound guidance, the vein was found to be occluded as there was inability to pass the wire.  The patient's left  neck was then prepped and draped in usual sterile fashion. Skin and subcutaneous tissues were infiltrated 1% lidocaine. Ultrasound guidance was used access the left internal jugular vein. 018 micro wire was passed into the right heart under fluoroscopy.  Incision was made on the needle on the needle was removed over the wire. Micropuncture set was then placed over the micro wire. The inner dilator and cannula were removed along with the wire, and an 035 Bentson wire was placed into the right heart. A 9 French short sheath was then placed over the wire. Through the short sheath, the Bentson wire and a Kumpe E were used to navigate into the IVC. An 73 Bentson wire was then placed into the IVC. Dilation of the tissue tract with an 16 French dilator was performed after removing the short sheath, and then a 11 [place] TIPS sheath was placed into  the right atrium. A Kumpe the catheter and Bentson wire were then navigated into the right hepatic vein. Venogram was performed.  A stiff Amplatz wire was then advanced into the right hepatic vein. The Kumpe the catheter was removed, and then the guide sheath for the transjugular biopsied sat was advanced into the right hepatic vein. We confirmed that the catheter was lodged against the side wall with a small amount of contrast infusion, and then several biopsies were retrieved. Three samples were placed into formalin and 3 samples were placed into saline.  Once we had retrieved adequate tissue, all wires catheters and sheaths were we removed.  Manual compression was used for hemostasis.  The patient tolerated the procedure well and remained hemodynamically stable throughout.  No complications were encountered and no significant blood loss was encountered.  Marland Kitchen  FINDINGS: The right internal jugular vein is patent above the entry of right chest wall port catheter, below this and at the site of the catheter entry the vein is occluded.  Patent left IJ vein.  Confirmation of catheter tip within the right hepatic vein before biopsy retrieved.  At least 3 tissue biopsy were sent in both formalin and saline to the pathology lab.  IMPRESSION: Status post transjugular liver biopsy, with tissue specimen sent to pathology for complete histopathologic analysis.  Signed,  Dulcy Fanny. Earleen Newport, DO  [facility]  [facility]   Electronically Signed   By: Corrie Mckusick D.O.   On: 12/29/2014 19:19    Medications:  Scheduled: . enoxaparin (LOVENOX) injection  40 mg Subcutaneous Q24H  . glycopyrrolate  2 mg Oral BID  . lactulose  10 g Oral TID  . nicotine  7 mg Transdermal Daily  . pantoprazole (PROTONIX) IV  40 mg Intravenous QHS  . rifaximin  550 mg Oral BID  . sodium chloride  3 mL Intravenous Q12H  . sucralfate  1 g Oral TID WC & HS   Continuous:   Assessment/Plan: 1)  Metastatic breast cancer with carcinomatosis. 2) Cirrhosis of unknown etiology. 3) Elevated liver enzymes.   Her abdomen is benign at this time.   She appears drowsy this AM, but this maybe from her pain medications.  The transjugular liver biopsy is pending.  Liver enzymes mildly improved.  Overall this is a difficult case and the prognosis is poor.  Plan: 1) Continue with current medical management. 2) Await biopsy results.  LOS: 5 days   Tammy Blanchard D 12/30/2014, 9:42 AM

## 2014-12-30 NOTE — Progress Notes (Signed)
CARE MANAGEMENT NOTE 12/30/2014  Patient:  Tammy Blanchard, Tammy Blanchard   Account Number:  [id number]  Date Initiated:  12/26/2014  Documentation initiated by:  Karl Bales  Subjective/Objective Assessment:   Pt admitted with cco N,V,D     Action/Plan:   from home   Anticipated DC Date:  12/27/2014   Anticipated DC Plan:  [place]  CM consult      Choice offered to / List presented to:  C-1 Patient   DME arranged  [place] Rile      DME agency  [place] arranged  HH-1 RN      [place]   Status of service:  Completed, signed off Medicare Important Message given?   (If response is "NO", the following Medicare IM given date fields will be blank) Date Medicare IM given:   Medicare IM given by:   Date Additional Medicare IM given:   Additional Medicare IM given by:    Discharge Disposition:    Per UR Regulation:  Reviewed for med. necessity/level of care/duration of stay  If discussed at [place] of Stay Meetings, dates discussed:   12/28/2014    Comments:  12/30/2014 1030 NCM spoke to pt and she does have RW with seat in the room. HH arranged with [facility]. Jonnie Finner RN CCM Case Mgmt phone [phone]  12/29/14 MMcGibboney, RN, BSN Spoke with pt concerning Home Health. She and family selected [place] referral given to in house rep.  12/26/14 MMcGibboney, RN, BSN Chart reviewed.

## 2014-12-30 NOTE — Progress Notes (Signed)
[facility] PROGRESS NOTE  Tammy Blanchard:[id number] DOB: 1966-08-03 DOA: 12/24/2014 PCP: Angelica Chessman, MD   HPI Patient with stage IV breast cancer metastasized aches prepped pelvis, bones and liver and carcinomatosis Patient is status post chemotherapy and lumpectomy. Patient is currently on oral chemotherapy. For the past 2 weeks she have been having worsening abdominal pain and generalized pain.  Subjective: - intermittent lethargy, refusing medications  Assessment/Plan:    Elevated liver function tests / ? Liver cirrhosis vs metastasis  - CT shows findings suggestive of cirrhosis.  Unclear etiology, appreciate GI input.  - Discussed with Dr. [name], plan for liver biopsy, appreciate interventional radiologist assistance, biopsy done 2/26 - elevated ammonia mental status better but still lethargic and forgetfulness  - Her AST and ALT seem to have been stable over the last couple of days, however her bilirubin continues to increase and is now 6.7 from 3.4 a few days ago.  - She is showing slight hepatic encephalopathy which is somewhat responding to lactulose and rifaximin.   I'm somewhat concerned about her trajectory and if biopsy shows advanced fibrosis I am not sure how much there is to be reversed. I have discussed this extensively with the patient and the patient's family today. At one point the patient told me "I don't want to spend the last days of my life in the hospital".   Ascites - in the setting of liver disfunction - no evidence of SBP, high SAAG  - cytology with "metastatic carcinoma consistent with breast"  Mild hepatic encephalopathy - lactulose and Rifaximin  Intractable nausea, vomiting, and abdominal pain - CT without evidence of obstruction.  Abdominal CT also showed cholelithiasis, doubt cholecystitis. - CT of abdomen and pelvis also showed moderate volume ascites, obtained diagnostic paracentesis 2/24  Dehydration - Due to above,  nausea and vomiting. Improving  Stage IV breast cancer with widespread metastatic disease - Management as per oncology  Essential hypertension  - Hypertension improved with IV fluids.  Patient does not appear to be on any antihypertensive medications at home.    Possible esophagitis  - Supportive management, patient started on IV Protonix.  Chronic pain syndrome/cancer associated pain  - IV pain medications as needed.    Acute encephalopathy - Multifactorial, may be due to dehydration.  Head CT showed unchanged appearance of chronic white matter encephalomalacia, likely due to ischemia with diffuse bone metastasis.  Prophylaxis - Lovenox, Protonix  Code Status: Full code. Family Communication: family bedside Disposition Plan: Pending  Consultants:  Oncology - Dr. [name]  Procedures:  Head CT - 12/25/2014  CT abdomen and pelvis with contrast - 12/24/2014  Antibiotics:  None  Objective: Filed Vitals:   12/29/14 1846 12/29/14 2153 12/30/14 0602 12/30/14 1339  BP: 96/63 103/70 94/64 105/70  Pulse: 110 111 113 111  Temp:  98.2 F (36.8 C) 98.5 F (36.9 C) 99.2 F (37.3 C)  TempSrc:  Oral Oral Oral  Resp: 24 22 22 20   Height:      Weight:      SpO2: 100% 100% 97% 98%    Intake/Output Summary (Last 24 hours) at 12/30/14 1414 Last data filed at 12/30/14 1300  Gross per 24 hour  Intake    720 ml  Output      0 ml  Net    720 ml   Filed Weights   12/24/14 1858 12/25/14 0117  Weight: 66.225 kg (146 lb) 66.8 kg (147 lb 4.3 oz)   Exam: General: Awake, Oriented,  No acute distress. Crying intermittently HEENT: EOMI. Neck: Supple CV: S1 and S2 Lungs: Clear to ascultation bilaterally Abdomen: Soft, generalized tenderness, mild distention, +bowel sounds. Ext: Good pulses. Trace edema.  Data Reviewed: Basic Metabolic Panel:  Recent Labs Lab 12/25/14 0230 12/26/14 0440 12/27/14 0515 12/28/14 0341 12/29/14 0515 12/30/14 0520  NA 137 141 141 138 139  139  K 3.9 4.1 4.0 3.9 4.0 4.4  CL 110 111 110 114* 110 112  CO2 21 21 19 19  18* 18*  GLUCOSE 104* 100* 100* 96 105* 101*  BUN 32* 30* 31* 32* 32* 39*  CREATININE 1.01 0.91 1.02 0.91 0.89 1.00  CALCIUM 8.9 9.3 9.6 9.2 9.7 9.9  MG 2.1  --   --   --   --   --   PHOS 3.8  --   --   --   --   --    Liver Function Tests:  Recent Labs Lab 12/26/14 0440 12/27/14 0515 12/28/14 0341 12/29/14 0515 12/30/14 0520  AST 349* 404* 502* 456* 480*  ALT 107* 114* 122* 114* 113*  ALKPHOS 730* 791* 758* 711* 763*  BILITOT 3.8* 4.8* 4.8* 5.6* 6.7*  PROT 5.7* 5.9* 5.6* 5.6* 5.5*  ALBUMIN 2.6* 2.7* 2.4* 2.4* 2.4*    Recent Labs Lab 12/24/14 1935  LIPASE 54    Recent Labs Lab 12/25/14 0016 12/28/14 0345 12/30/14 0520  AMMONIA 76* 108* 71*   CBC:  Recent Labs Lab 12/26/14 0440 12/27/14 0515 12/28/14 0341 12/29/14 0515 12/30/14 0520  WBC 2.4* 4.6 5.4 6.1 7.0  HGB 14.1 9.8* 9.7* 10.0* 10.0*  HCT 44.8 31.3* 30.8* 31.3* 31.3*  MCV 78.2 78.6 78.6 78.1 77.1*  PLT 93* 170 145* 145* 118*    Recent Results (from the past 240 hour(s))  Urine culture     Status: None   Collection Time: 12/25/14  1:53 AM  Result Value Ref Range Status   Specimen Description URINE, CLEAN CATCH  Final   Special Requests NONE  Final   Colony Count   Final    >=100,000 COLONIES/ML Performed at [facility]    Culture   Final    Multiple bacterial morphotypes present, none predominant. Suggest appropriate recollection if clinically indicated. Performed at [facility]    Report Status 12/26/2014 FINAL  Final  Clostridium Difficile by PCR     Status: None   Collection Time: 12/27/14  1:28 PM  Result Value Ref Range Status   C difficile by pcr NEGATIVE NEGATIVE Final    Comment: Performed at [facility]  Body fluid culture     Status: None (Preliminary result)   Collection Time: 12/27/14  3:42 PM  Result Value Ref Range Status   Specimen Description ASCITIC  Final    Special Requests NONE  Final   Gram Stain   Final    RARE WBC PRESENT, PREDOMINANTLY MONONUCLEAR NO ORGANISMS SEEN Performed at [facility]    Culture   Final    NO GROWTH 3 DAYS Performed at [facility]    Report Status PENDING  Incomplete     Studies: Ir Transcatheter Bx  12/29/2014   CLINICAL DATA:  50 year old female with a history of metastatic breast carcinoma.  She has had rapid deterioration of her liver, with imaging differential diagnosis of cirrhosis or potentially diffuse breast metastases. She has been referred for liver biopsy, and given her malignant ascites, the biopsy will be done trans jugular.  EXAM: ULTRASOUND GUIDANCE FOR VASCULAR  ACCESS  FLUOROSCOPIC GUIDANCE FOR TRANSJUGULAR LIVER BIOPSY.  Date:  2/26/20162/26/2016 7:04 pm  FLUOROSCOPY TIME:  7 MINUTES, 36 SECONDS 169 mGy  MEDICATIONS AND MEDICAL HISTORY: 3.0 mg Versed, 150 mcg fentanyl  ANESTHESIA/SEDATION: 45 minutes  CONTRAST:  48mL OMNIPAQUE IOHEXOL 300 MG/ML  SOLN  COMPLICATIONS: None  PROCEDURE: Informed consent was obtained from the patient following explanation of the procedure, risks, benefits and alternatives. The patient understands, agrees and consents for the procedure. All questions were addressed. A time out was performed.  Maximal barrier sterile technique utilized including caps, mask, sterile gowns, sterile gloves, large sterile drape, hand hygiene, and betadine prep.  The patient's right neck was prepped and draped in the usual sterile fashion. Ultrasound survey demonstrated a patent right internal jugular vein above the level of the entry of a indwelling right port catheter into the IJ vein. Once access was achieved with ultrasound guidance, the vein was found to be occluded as there was inability to pass the wire.  The patient's left neck was then prepped and draped in usual sterile fashion. Skin and subcutaneous tissues were infiltrated 1% lidocaine. Ultrasound guidance was used access the  left internal jugular vein. 018 micro wire was passed into the right heart under fluoroscopy.  Incision was made on the needle on the needle was removed over the wire. Micropuncture set was then placed over the micro wire. The inner dilator and cannula were removed along with the wire, and an 035 Bentson wire was placed into the right heart. A 9 French short sheath was then placed over the wire. Through the short sheath, the Bentson wire and a Kumpe E were used to navigate into the IVC. An 67 Bentson wire was then placed into the IVC. Dilation of the tissue tract with an 46 French dilator was performed after removing the short sheath, and then a 11 [place] TIPS sheath was placed into the right atrium. A Kumpe the catheter and Bentson wire were then navigated into the right hepatic vein. Venogram was performed.  A stiff Amplatz wire was then advanced into the right hepatic vein. The Kumpe the catheter was removed, and then the guide sheath for the transjugular biopsied sat was advanced into the right hepatic vein. We confirmed that the catheter was lodged against the side wall with a small amount of contrast infusion, and then several biopsies were retrieved. Three samples were placed into formalin and 3 samples were placed into saline.  Once we had retrieved adequate tissue, all wires catheters and sheaths were we removed.  Manual compression was used for hemostasis.  The patient tolerated the procedure well and remained hemodynamically stable throughout.  No complications were encountered and no significant blood loss was encountered.  Marland Kitchen  FINDINGS: The right internal jugular vein is patent above the entry of right chest wall port catheter, below this and at the site of the catheter entry the vein is occluded.  Patent left IJ vein.  Confirmation of catheter tip within the right hepatic vein before biopsy retrieved.  At least 3 tissue biopsy were sent in both formalin and saline to the pathology lab.  IMPRESSION:  Status post transjugular liver biopsy, with tissue specimen sent to pathology for complete histopathologic analysis.  Signed,  Dulcy Fanny. Earleen Newport, DO  [facility]  [facility]   Electronically Signed   By: Corrie Mckusick D.O.   On: 12/29/2014 19:19   Ir US Guide Vasc Access Left  12/29/2014   CLINICAL DATA:  49 year old female with a history of metastatic breast carcinoma.  She has had rapid deterioration of her liver, with imaging differential diagnosis of cirrhosis or potentially diffuse breast metastases. She has been referred for liver biopsy, and given her malignant ascites, the biopsy will be done trans jugular.  EXAM: ULTRASOUND GUIDANCE FOR VASCULAR ACCESS  FLUOROSCOPIC GUIDANCE FOR TRANSJUGULAR LIVER BIOPSY.  Date:  2/26/20162/26/2016 7:04 pm  FLUOROSCOPY TIME:  7 MINUTES, 36 SECONDS 169 mGy  MEDICATIONS AND MEDICAL HISTORY: 3.0 mg Versed, 150 mcg fentanyl  ANESTHESIA/SEDATION: 45 minutes  CONTRAST:  83mL OMNIPAQUE IOHEXOL 300 MG/ML  SOLN  COMPLICATIONS: None  PROCEDURE: Informed consent was obtained from the patient following explanation of the procedure, risks, benefits and alternatives. The patient understands, agrees and consents for the procedure. All questions were addressed. A time out was performed.  Maximal barrier sterile technique utilized including caps, mask, sterile gowns, sterile gloves, large sterile drape, hand hygiene, and betadine prep.  The patient's right neck was prepped and draped in the usual sterile fashion. Ultrasound survey demonstrated a patent right internal jugular vein above the level of the entry of a indwelling right port catheter into the IJ vein. Once access was achieved with ultrasound guidance, the vein was found to be occluded as there was inability to pass the wire.  The patient's left neck was then prepped and draped in usual sterile fashion. Skin and subcutaneous tissues were infiltrated 1% lidocaine. Ultrasound guidance  was used access the left internal jugular vein. 018 micro wire was passed into the right heart under fluoroscopy.  Incision was made on the needle on the needle was removed over the wire. Micropuncture set was then placed over the micro wire. The inner dilator and cannula were removed along with the wire, and an 035 Bentson wire was placed into the right heart. A 9 French short sheath was then placed over the wire. Through the short sheath, the Bentson wire and a Kumpe E were used to navigate into the IVC. An 43 Bentson wire was then placed into the IVC. Dilation of the tissue tract with an 75 French dilator was performed after removing the short sheath, and then a 11 [place] TIPS sheath was placed into the right atrium. A Kumpe the catheter and Bentson wire were then navigated into the right hepatic vein. Venogram was performed.  A stiff Amplatz wire was then advanced into the right hepatic vein. The Kumpe the catheter was removed, and then the guide sheath for the transjugular biopsied sat was advanced into the right hepatic vein. We confirmed that the catheter was lodged against the side wall with a small amount of contrast infusion, and then several biopsies were retrieved. Three samples were placed into formalin and 3 samples were placed into saline.  Once we had retrieved adequate tissue, all wires catheters and sheaths were we removed.  Manual compression was used for hemostasis.  The patient tolerated the procedure well and remained hemodynamically stable throughout.  No complications were encountered and no significant blood loss was encountered.  Marland Kitchen  FINDINGS: The right internal jugular vein is patent above the entry of right chest wall port catheter, below this and at the site of the catheter entry the vein is occluded.  Patent left IJ vein.  Confirmation of catheter tip within the right hepatic vein before biopsy retrieved.  At least 3 tissue biopsy were sent in both formalin and saline to the pathology  lab.  IMPRESSION: Status post transjugular liver biopsy, with tissue specimen  sent to pathology for complete histopathologic analysis.  Signed,  Dulcy Fanny. Earleen Newport, DO  [facility]  [facility]   Electronically Signed   By: Corrie Mckusick D.O.   On: 12/29/2014 19:19    Scheduled Meds: . enoxaparin (LOVENOX) injection  40 mg Subcutaneous Q24H  . glycopyrrolate  2 mg Oral BID  . lactulose  10 g Oral TID  . nicotine  7 mg Transdermal Daily  . pantoprazole  40 mg Oral Daily  . rifaximin  550 mg Oral BID  . sodium chloride  3 mL Intravenous Q12H  . sucralfate  1 g Oral TID WC & HS   Continuous Infusions:    Active Problems:   Breast carcinoma metastatic to pelvis   Essential hypertension   Breast cancer metastasized to multiple sites   Liver metastases   Intractable nausea and vomiting   Cancer associated pain   Cancer related pain   Arterial hypotension   Hepatic cirrhosis   Elevated LFTs   Ascites   Encephalopathy, hepatic  Time spent: 45 minutes, more than 50% in family discussions  Marzetta Board, MD  [facility] Pager [phone]. If 7PM-7AM, please contact night-coverage at [email or web address], password [facility] 12/30/2014, 2:14 PM  LOS: 5 days

## 2014-12-31 ENCOUNTER — Other Ambulatory Visit: Payer: Self-pay | Admitting: Oncology

## 2014-12-31 DIAGNOSIS — N179 Acute kidney failure, unspecified: Secondary | ICD-10-CM

## 2014-12-31 LAB — AMMONIA: AMMONIA: 98 umol/L — AB (ref 11–32)

## 2014-12-31 LAB — BODY FLUID CULTURE: Culture: NO GROWTH

## 2014-12-31 LAB — COMPREHENSIVE METABOLIC PANEL
ALT: 121 U/L — AB (ref 0–35)
ANION GAP: 10 (ref 5–15)
AST: 521 U/L — ABNORMAL HIGH (ref 0–37)
Albumin: 2.2 g/dL — ABNORMAL LOW (ref 3.5–5.2)
Alkaline Phosphatase: 762 U/L — ABNORMAL HIGH (ref 39–117)
BUN: 43 mg/dL — ABNORMAL HIGH (ref 6–23)
CHLORIDE: 108 mmol/L (ref 96–112)
CO2: 16 mmol/L — AB (ref 19–32)
Calcium: 9.2 mg/dL (ref 8.4–10.5)
Creatinine, Ser: 1.33 mg/dL — ABNORMAL HIGH (ref 0.50–1.10)
GFR calc Af Amer: 54 mL/min — ABNORMAL LOW (ref >90)
GFR calc non Af Amer: 47 mL/min — ABNORMAL LOW (ref >90)
GLUCOSE: 101 mg/dL — AB (ref 70–99)
Potassium: 4.3 mmol/L (ref 3.5–5.1)
Sodium: 134 mmol/L — ABNORMAL LOW (ref 135–145)
Total Bilirubin: 7.4 mg/dL — ABNORMAL HIGH (ref 0.3–1.2)
Total Protein: 5.3 g/dL — ABNORMAL LOW (ref 6.0–8.3)

## 2014-12-31 LAB — CBC
HEMATOCRIT: 30.2 % — AB (ref 36.0–46.0)
Hemoglobin: 9.8 g/dL — ABNORMAL LOW (ref 12.0–15.0)
MCH: 25.1 pg — AB (ref 26.0–34.0)
MCHC: 32.5 g/dL (ref 30.0–36.0)
MCV: 77.4 fL — ABNORMAL LOW (ref 78.0–100.0)
PLATELETS: 125 10*3/uL — AB (ref 150–400)
RBC: 3.9 MIL/uL (ref 3.87–5.11)
RDW: 18.3 % — ABNORMAL HIGH (ref 11.5–15.5)
WBC: 7 10*3/uL (ref 4.0–10.5)

## 2014-12-31 LAB — PROTIME-INR
INR: 1.47 (ref 0.00–1.49)
Prothrombin Time: 17.9 seconds — ABNORMAL HIGH (ref 11.6–15.2)

## 2014-12-31 LAB — PHOSPHORUS: Phosphorus: 4.6 mg/dL (ref 2.3–4.6)

## 2014-12-31 LAB — MAGNESIUM: Magnesium: 2.3 mg/dL (ref 1.5–2.5)

## 2014-12-31 MED ORDER — LACTULOSE 10 GM/15ML PO SOLN
20.0000 g | Freq: Three times a day (TID) | ORAL | Status: DC
  Administered 2014-12-31 – 2015-01-02 (×5): 20 g via ORAL
  Filled 2014-12-31 (×8): qty 30

## 2014-12-31 MED ORDER — IBUPROFEN 200 MG PO TABS
400.0000 mg | ORAL_TABLET | ORAL | Status: DC | PRN
Start: 2014-12-31 — End: 2015-01-03
  Filled 2014-12-31: qty 2

## 2014-12-31 MED ORDER — SODIUM CHLORIDE 0.9 % IV BOLUS (SEPSIS)
500.0000 mL | Freq: Once | INTRAVENOUS | Status: AC
  Administered 2014-12-31: 500 mL via INTRAVENOUS

## 2014-12-31 NOTE — Progress Notes (Signed)
Subjective: No complaints.  Objective: Vital signs in last 24 hours: Temp:  [98.9 F (37.2 C)-99.4 F (37.4 C)] 98.9 F (37.2 C) (02/28 0551) Pulse Rate:  [100-111] 100 (02/28 0551) Resp:  [20] 20 (02/28 0551) BP: (91-105)/(66-70) 95/67 mmHg (02/28 0551) SpO2:  [96 %-100 %] 100 % (02/28 0551) Last BM Date: 12/30/14  Intake/Output from previous day: 02/27 0701 - 02/28 0700 In: 720 [P.O.:720] Out: -  Intake/Output this shift:    General appearance: sleepy, but she just woke up GI: soft, distended with ascites.  Lab Results:  Recent Labs  12/29/14 0515 12/30/14 0520 12/31/14 0510  WBC 6.1 7.0 7.0  HGB 10.0* 10.0* 9.8*  HCT 31.3* 31.3* 30.2*  PLT 145* 118* 125*   BMET  Recent Labs  12/29/14 0515 12/30/14 0520 12/31/14 0510  NA 139 139 134*  K 4.0 4.4 4.3  CL 110 112 108  CO2 18* 18* 16*  GLUCOSE 105* 101* 101*  BUN 32* 39* 43*  CREATININE 0.89 1.00 1.33*  CALCIUM 9.7 9.9 9.2   LFT  Recent Labs  12/31/14 0510  PROT 5.3*  ALBUMIN 2.2*  AST 521*  ALT 121*  ALKPHOS 762*  BILITOT 7.4*   PT/INR  Recent Labs  12/31/14 0510  LABPROT 17.9*  INR 1.47   Hepatitis Panel No results for input(s): HEPBSAG, HCVAB, HEPAIGM, HEPBIGM in the last 72 hours. C-Diff No results for input(s): CDIFFTOX in the last 72 hours. Fecal Lactopherrin No results for input(s): FECLLACTOFRN in the last 72 hours.  Studies/Results: Ir Transcatheter Bx  12/29/2014   CLINICAL DATA:  49 year old female with a history of metastatic breast carcinoma.  She has had rapid deterioration of her liver, with imaging differential diagnosis of cirrhosis or potentially diffuse breast metastases. She has been referred for liver biopsy, and given her malignant ascites, the biopsy will be done trans jugular.  EXAM: ULTRASOUND GUIDANCE FOR VASCULAR ACCESS  FLUOROSCOPIC GUIDANCE FOR TRANSJUGULAR LIVER BIOPSY.  Date:  2/26/20162/26/2016 7:04 pm  FLUOROSCOPY TIME:  7 MINUTES, 36 SECONDS 169 mGy   MEDICATIONS AND MEDICAL HISTORY: 3.0 mg Versed, 150 mcg fentanyl  ANESTHESIA/SEDATION: 45 minutes  CONTRAST:  58mL OMNIPAQUE IOHEXOL 300 MG/ML  SOLN  COMPLICATIONS: None  PROCEDURE: Informed consent was obtained from the patient following explanation of the procedure, risks, benefits and alternatives. The patient understands, agrees and consents for the procedure. All questions were addressed. A time out was performed.  Maximal barrier sterile technique utilized including caps, mask, sterile gowns, sterile gloves, large sterile drape, hand hygiene, and betadine prep.  The patient's right neck was prepped and draped in the usual sterile fashion. Ultrasound survey demonstrated a patent right internal jugular vein above the level of the entry of a indwelling right port catheter into the IJ vein. Once access was achieved with ultrasound guidance, the vein was found to be occluded as there was inability to pass the wire.  The patient's left neck was then prepped and draped in usual sterile fashion. Skin and subcutaneous tissues were infiltrated 1% lidocaine. Ultrasound guidance was used access the left internal jugular vein. 018 micro wire was passed into the right heart under fluoroscopy.  Incision was made on the needle on the needle was removed over the wire. Micropuncture set was then placed over the micro wire. The inner dilator and cannula were removed along with the wire, and an 035 Bentson wire was placed into the right heart. A 9 French short sheath was then placed over the wire. Through the  short sheath, the Bentson wire and a Kumpe E were used to navigate into the IVC. An 32 Bentson wire was then placed into the IVC. Dilation of the tissue tract with an 53 French dilator was performed after removing the short sheath, and then a 11 [place] TIPS sheath was placed into the right atrium. A Kumpe the catheter and Bentson wire were then navigated into the right hepatic vein. Venogram was performed.  A stiff Amplatz  wire was then advanced into the right hepatic vein. The Kumpe the catheter was removed, and then the guide sheath for the transjugular biopsied sat was advanced into the right hepatic vein. We confirmed that the catheter was lodged against the side wall with a small amount of contrast infusion, and then several biopsies were retrieved. Three samples were placed into formalin and 3 samples were placed into saline.  Once we had retrieved adequate tissue, all wires catheters and sheaths were we removed.  Manual compression was used for hemostasis.  The patient tolerated the procedure well and remained hemodynamically stable throughout.  No complications were encountered and no significant blood loss was encountered.  Marland Kitchen  FINDINGS: The right internal jugular vein is patent above the entry of right chest wall port catheter, below this and at the site of the catheter entry the vein is occluded.  Patent left IJ vein.  Confirmation of catheter tip within the right hepatic vein before biopsy retrieved.  At least 3 tissue biopsy were sent in both formalin and saline to the pathology lab.  IMPRESSION: Status post transjugular liver biopsy, with tissue specimen sent to pathology for complete histopathologic analysis.  Signed,  Dulcy Fanny. Earleen Newport, DO  [facility]  [facility]   Electronically Signed   By: Corrie Mckusick D.O.   On: 12/29/2014 19:19   Ir US Guide Vasc Access Left  12/29/2014   CLINICAL DATA:  49 year old female with a history of metastatic breast carcinoma.  She has had rapid deterioration of her liver, with imaging differential diagnosis of cirrhosis or potentially diffuse breast metastases. She has been referred for liver biopsy, and given her malignant ascites, the biopsy will be done trans jugular.  EXAM: ULTRASOUND GUIDANCE FOR VASCULAR ACCESS  FLUOROSCOPIC GUIDANCE FOR TRANSJUGULAR LIVER BIOPSY.  Date:  2/26/20162/26/2016 7:04 pm  FLUOROSCOPY TIME:  7 MINUTES, 36  SECONDS 169 mGy  MEDICATIONS AND MEDICAL HISTORY: 3.0 mg Versed, 150 mcg fentanyl  ANESTHESIA/SEDATION: 45 minutes  CONTRAST:  67mL OMNIPAQUE IOHEXOL 300 MG/ML  SOLN  COMPLICATIONS: None  PROCEDURE: Informed consent was obtained from the patient following explanation of the procedure, risks, benefits and alternatives. The patient understands, agrees and consents for the procedure. All questions were addressed. A time out was performed.  Maximal barrier sterile technique utilized including caps, mask, sterile gowns, sterile gloves, large sterile drape, hand hygiene, and betadine prep.  The patient's right neck was prepped and draped in the usual sterile fashion. Ultrasound survey demonstrated a patent right internal jugular vein above the level of the entry of a indwelling right port catheter into the IJ vein. Once access was achieved with ultrasound guidance, the vein was found to be occluded as there was inability to pass the wire.  The patient's left neck was then prepped and draped in usual sterile fashion. Skin and subcutaneous tissues were infiltrated 1% lidocaine. Ultrasound guidance was used access the left internal jugular vein. 018 micro wire was passed into the right heart under fluoroscopy.  Incision was made on  the needle on the needle was removed over the wire. Micropuncture set was then placed over the micro wire. The inner dilator and cannula were removed along with the wire, and an 035 Bentson wire was placed into the right heart. A 9 French short sheath was then placed over the wire. Through the short sheath, the Bentson wire and a Kumpe E were used to navigate into the IVC. An 60 Bentson wire was then placed into the IVC. Dilation of the tissue tract with an 19 French dilator was performed after removing the short sheath, and then a 11 [place] TIPS sheath was placed into the right atrium. A Kumpe the catheter and Bentson wire were then navigated into the right hepatic vein. Venogram was performed.   A stiff Amplatz wire was then advanced into the right hepatic vein. The Kumpe the catheter was removed, and then the guide sheath for the transjugular biopsied sat was advanced into the right hepatic vein. We confirmed that the catheter was lodged against the side wall with a small amount of contrast infusion, and then several biopsies were retrieved. Three samples were placed into formalin and 3 samples were placed into saline.  Once we had retrieved adequate tissue, all wires catheters and sheaths were we removed.  Manual compression was used for hemostasis.  The patient tolerated the procedure well and remained hemodynamically stable throughout.  No complications were encountered and no significant blood loss was encountered.  Marland Kitchen  FINDINGS: The right internal jugular vein is patent above the entry of right chest wall port catheter, below this and at the site of the catheter entry the vein is occluded.  Patent left IJ vein.  Confirmation of catheter tip within the right hepatic vein before biopsy retrieved.  At least 3 tissue biopsy were sent in both formalin and saline to the pathology lab.  IMPRESSION: Status post transjugular liver biopsy, with tissue specimen sent to pathology for complete histopathologic analysis.  Signed,  Dulcy Fanny. Earleen Newport, DO  [facility]  [facility]   Electronically Signed   By: Corrie Mckusick D.O.   On: 12/29/2014 19:19    Medications:  Scheduled: . glycopyrrolate  2 mg Oral BID  . lactulose  10 g Oral TID  . nicotine  7 mg Transdermal Daily  . pantoprazole  40 mg Oral Daily  . rifaximin  550 mg Oral BID  . sodium chloride  3 mL Intravenous Q12H  . sucralfate  1 g Oral TID WC & HS   Continuous:   Assessment/Plan: 1) Metastatic breast cancer with carcinomatosis. 2) Cirrhosis of unknown etiology. 3) Elevated liver enzymes.   No new recommendations.  Prognosis is poor.  Liver biopsy results pending.  Plan: 1) Continue with  medical management and await liver biopsy.   LOS: 6 days   Shaunie Boehm D 12/31/2014, 8:21 AM

## 2014-12-31 NOTE — Progress Notes (Signed)
Patient blood pressure in the 80s, Dr. Cruzita Lederer notified  And Normal Saline bolus ordered and given. Will continue to assess patient. Patient alert but still lethargic, MD aware as well.

## 2014-12-31 NOTE — Progress Notes (Signed)
[facility] PROGRESS NOTE  DEVYNN HESSLER WIO:[id number] DOB: 04/04/1966 DOA: 12/24/2014 PCP: Angelica Chessman, MD   HPI Patient with stage IV breast cancer metastasized aches prepped pelvis, bones and liver and carcinomatosis Patient is status post chemotherapy and lumpectomy. Patient is currently on oral chemotherapy. For the past 2 weeks she have been having worsening abdominal pain and generalized pain.  Subjective: - with increasing scleral icterus, sleepy - Continues to intermittently refuse medications  Assessment/Plan:    Elevated liver function tests / ? Liver cirrhosis vs metastasis  - CT shows findings suggestive of cirrhosis.  Unclear etiology, appreciate GI input.  - Discussed with Dr. [name], obtained liver biopsy on 2/26 - elevated ammonia persistent despite lactulose and rifaximin, mental status overall better but still lethargic and forgetfulness  - Her liver enzymes continued to increase, her bilirubin is now 7.4  I'm concerned about her trajectory and if biopsy shows advanced fibrosis I am not sure how much there is to be reversed. I have discussed this extensively with the patient and the patient's family. At one point the patient told me "I don't want to spend the last days of my life in the hospital".   Ascites - in the setting of liver disfunction - no evidence of SBP, high SAAG  - cytology with "metastatic carcinoma consistent with breast"  AKI - slight dehydration - small 500 cc bolus  Hepatic encephalopathy - lactulose and Rifaximin  Intractable nausea, vomiting, and abdominal pain - CT without evidence of obstruction.  Abdominal CT also showed cholelithiasis, doubt cholecystitis. - CT of abdomen and pelvis also showed moderate volume ascites, obtained diagnostic paracentesis 2/24  Dehydration - Due to above, nausea and vomiting. Improving  Stage IV breast cancer with widespread metastatic disease - Management as per  oncology  Possible esophagitis  - Supportive management, patient started on IV Protonix.  Chronic pain syndrome/cancer associated pain  - IV pain medications as needed.    Acute encephalopathy - Multifactorial, may be due to dehydration.  Head CT showed unchanged appearance of chronic white matter encephalomalacia, likely due to ischemia with diffuse bone metastasis.  Prophylaxis - Lovenox, Protonix  Code Status: Full code. Family Communication: family bedside Disposition Plan: Pending  Consultants:  Oncology - Dr. [name]  GI  IR  Procedures:  Head CT - 12/25/2014  CT abdomen and pelvis with contrast - 12/24/2014  Antibiotics:  None  Objective: Filed Vitals:   12/30/14 0602 12/30/14 1339 12/30/14 2255 12/31/14 0551  BP: 94/64 105/70 91/66 95/67   Pulse: 113 111 110 100  Temp: 98.5 F (36.9 C) 99.2 F (37.3 C) 99.4 F (37.4 C) 98.9 F (37.2 C)  TempSrc: Oral Oral Oral Oral  Resp: 22 20 20 20   Height:      Weight:      SpO2: 97% 98% 96% 100%    Intake/Output Summary (Last 24 hours) at 12/31/14 0808 Last data filed at 12/30/14 1300  Gross per 24 hour  Intake    720 ml  Output      0 ml  Net    720 ml   Filed Weights   12/24/14 1858 12/25/14 0117  Weight: 66.225 kg (146 lb) 66.8 kg (147 lb 4.3 oz)   Exam: General: Awake, Oriented, No acute distress. HEENT: EOMI. Neck: Supple CV: S1 and S2 Lungs: Clear to ascultation bilaterally Abdomen: Soft, generalized tenderness, mild distention, +bowel sounds. Ext: Good pulses. Trace edema.  Data Reviewed: Basic Metabolic Panel:  Recent Labs Lab 12/25/14 0230  12/27/14 0515 12/28/14 0341 12/29/14 0515 12/30/14 0520 12/31/14 0510  NA 137  < > 141 138 139 139 134*  K 3.9  < > 4.0 3.9 4.0 4.4 4.3  CL 110  < > 110 114* 110 112 108  CO2 21  < > 19 19 18* 18* 16*  GLUCOSE 104*  < > 100* 96 105* 101* 101*  BUN 32*  < > 31* 32* 32* 39* 43*  CREATININE 1.01  < > 1.02 0.91 0.89 1.00 1.33*  CALCIUM 8.9   < > 9.6 9.2 9.7 9.9 9.2  MG 2.1  --   --   --   --   --  2.3  PHOS 3.8  --   --   --   --   --  4.6  < > = values in this interval not displayed. Liver Function Tests:  Recent Labs Lab 12/27/14 0515 12/28/14 0341 12/29/14 0515 12/30/14 0520 12/31/14 0510  AST 404* 502* 456* 480* 521*  ALT 114* 122* 114* 113* 121*  ALKPHOS 791* 758* 711* 763* 762*  BILITOT 4.8* 4.8* 5.6* 6.7* 7.4*  PROT 5.9* 5.6* 5.6* 5.5* 5.3*  ALBUMIN 2.7* 2.4* 2.4* 2.4* 2.2*    Recent Labs Lab 12/24/14 1935  LIPASE 54    Recent Labs Lab 12/25/14 0016 12/28/14 0345 12/30/14 0520 12/31/14 0510  AMMONIA 76* 108* 71* 98*   CBC:  Recent Labs Lab 12/27/14 0515 12/28/14 0341 12/29/14 0515 12/30/14 0520 12/31/14 0510  WBC 4.6 5.4 6.1 7.0 7.0  HGB 9.8* 9.7* 10.0* 10.0* 9.8*  HCT 31.3* 30.8* 31.3* 31.3* 30.2*  MCV 78.6 78.6 78.1 77.1* 77.4*  PLT 170 145* 145* 118* 125*    Recent Results (from the past 240 hour(s))  Urine culture     Status: None   Collection Time: 12/25/14  1:53 AM  Result Value Ref Range Status   Specimen Description URINE, CLEAN CATCH  Final   Special Requests NONE  Final   Colony Count   Final    >=100,000 COLONIES/ML Performed at [facility]    Culture   Final    Multiple bacterial morphotypes present, none predominant. Suggest appropriate recollection if clinically indicated. Performed at [facility]    Report Status 12/26/2014 FINAL  Final  Clostridium Difficile by PCR     Status: None   Collection Time: 12/27/14  1:28 PM  Result Value Ref Range Status   C difficile by pcr NEGATIVE NEGATIVE Final    Comment: Performed at [facility]  Body fluid culture     Status: None (Preliminary result)   Collection Time: 12/27/14  3:42 PM  Result Value Ref Range Status   Specimen Description ASCITIC  Final   Special Requests NONE  Final   Gram Stain   Final    RARE WBC PRESENT, PREDOMINANTLY MONONUCLEAR NO ORGANISMS SEEN Performed at [facility]    Culture   Final    NO GROWTH 3 DAYS Performed at [facility]    Report Status PENDING  Incomplete     Studies: Ir Transcatheter Bx  12/29/2014   CLINICAL DATA:  49 year old female with a history of metastatic breast carcinoma.  She has had rapid deterioration of her liver, with imaging differential diagnosis of cirrhosis or potentially diffuse breast metastases. She has been referred for liver biopsy, and given her malignant ascites, the biopsy will be done trans jugular.  EXAM: ULTRASOUND GUIDANCE FOR VASCULAR ACCESS  FLUOROSCOPIC GUIDANCE FOR TRANSJUGULAR  LIVER BIOPSY.  Date:  2/26/20162/26/2016 7:04 pm  FLUOROSCOPY TIME:  7 MINUTES, 36 SECONDS 169 mGy  MEDICATIONS AND MEDICAL HISTORY: 3.0 mg Versed, 150 mcg fentanyl  ANESTHESIA/SEDATION: 45 minutes  CONTRAST:  30mL OMNIPAQUE IOHEXOL 300 MG/ML  SOLN  COMPLICATIONS: None  PROCEDURE: Informed consent was obtained from the patient following explanation of the procedure, risks, benefits and alternatives. The patient understands, agrees and consents for the procedure. All questions were addressed. A time out was performed.  Maximal barrier sterile technique utilized including caps, mask, sterile gowns, sterile gloves, large sterile drape, hand hygiene, and betadine prep.  The patient's right neck was prepped and draped in the usual sterile fashion. Ultrasound survey demonstrated a patent right internal jugular vein above the level of the entry of a indwelling right port catheter into the IJ vein. Once access was achieved with ultrasound guidance, the vein was found to be occluded as there was inability to pass the wire.  The patient's left neck was then prepped and draped in usual sterile fashion. Skin and subcutaneous tissues were infiltrated 1% lidocaine. Ultrasound guidance was used access the left internal jugular vein. 018 micro wire was passed into the right heart under fluoroscopy.  Incision was made on the needle on the  needle was removed over the wire. Micropuncture set was then placed over the micro wire. The inner dilator and cannula were removed along with the wire, and an 035 Bentson wire was placed into the right heart. A 9 French short sheath was then placed over the wire. Through the short sheath, the Bentson wire and a Kumpe E were used to navigate into the IVC. An 6 Bentson wire was then placed into the IVC. Dilation of the tissue tract with an 28 French dilator was performed after removing the short sheath, and then a 11 [place] TIPS sheath was placed into the right atrium. A Kumpe the catheter and Bentson wire were then navigated into the right hepatic vein. Venogram was performed.  A stiff Amplatz wire was then advanced into the right hepatic vein. The Kumpe the catheter was removed, and then the guide sheath for the transjugular biopsied sat was advanced into the right hepatic vein. We confirmed that the catheter was lodged against the side wall with a small amount of contrast infusion, and then several biopsies were retrieved. Three samples were placed into formalin and 3 samples were placed into saline.  Once we had retrieved adequate tissue, all wires catheters and sheaths were we removed.  Manual compression was used for hemostasis.  The patient tolerated the procedure well and remained hemodynamically stable throughout.  No complications were encountered and no significant blood loss was encountered.  Marland Kitchen  FINDINGS: The right internal jugular vein is patent above the entry of right chest wall port catheter, below this and at the site of the catheter entry the vein is occluded.  Patent left IJ vein.  Confirmation of catheter tip within the right hepatic vein before biopsy retrieved.  At least 3 tissue biopsy were sent in both formalin and saline to the pathology lab.  IMPRESSION: Status post transjugular liver biopsy, with tissue specimen sent to pathology for complete histopathologic analysis.  Signed,  Dulcy Fanny.  Earleen Newport, DO  [facility]  [facility]   Electronically Signed   By: Corrie Mckusick D.O.   On: 12/29/2014 19:19   Ir US Guide Vasc Access Left  12/29/2014   CLINICAL DATA:  49 year old female with a history of  metastatic breast carcinoma.  She has had rapid deterioration of her liver, with imaging differential diagnosis of cirrhosis or potentially diffuse breast metastases. She has been referred for liver biopsy, and given her malignant ascites, the biopsy will be done trans jugular.  EXAM: ULTRASOUND GUIDANCE FOR VASCULAR ACCESS  FLUOROSCOPIC GUIDANCE FOR TRANSJUGULAR LIVER BIOPSY.  Date:  2/26/20162/26/2016 7:04 pm  FLUOROSCOPY TIME:  7 MINUTES, 36 SECONDS 169 mGy  MEDICATIONS AND MEDICAL HISTORY: 3.0 mg Versed, 150 mcg fentanyl  ANESTHESIA/SEDATION: 45 minutes  CONTRAST:  75mL OMNIPAQUE IOHEXOL 300 MG/ML  SOLN  COMPLICATIONS: None  PROCEDURE: Informed consent was obtained from the patient following explanation of the procedure, risks, benefits and alternatives. The patient understands, agrees and consents for the procedure. All questions were addressed. A time out was performed.  Maximal barrier sterile technique utilized including caps, mask, sterile gowns, sterile gloves, large sterile drape, hand hygiene, and betadine prep.  The patient's right neck was prepped and draped in the usual sterile fashion. Ultrasound survey demonstrated a patent right internal jugular vein above the level of the entry of a indwelling right port catheter into the IJ vein. Once access was achieved with ultrasound guidance, the vein was found to be occluded as there was inability to pass the wire.  The patient's left neck was then prepped and draped in usual sterile fashion. Skin and subcutaneous tissues were infiltrated 1% lidocaine. Ultrasound guidance was used access the left internal jugular vein. 018 micro wire was passed into the right heart under fluoroscopy.  Incision was made on  the needle on the needle was removed over the wire. Micropuncture set was then placed over the micro wire. The inner dilator and cannula were removed along with the wire, and an 035 Bentson wire was placed into the right heart. A 9 French short sheath was then placed over the wire. Through the short sheath, the Bentson wire and a Kumpe E were used to navigate into the IVC. An 77 Bentson wire was then placed into the IVC. Dilation of the tissue tract with an 2 French dilator was performed after removing the short sheath, and then a 11 [place] TIPS sheath was placed into the right atrium. A Kumpe the catheter and Bentson wire were then navigated into the right hepatic vein. Venogram was performed.  A stiff Amplatz wire was then advanced into the right hepatic vein. The Kumpe the catheter was removed, and then the guide sheath for the transjugular biopsied sat was advanced into the right hepatic vein. We confirmed that the catheter was lodged against the side wall with a small amount of contrast infusion, and then several biopsies were retrieved. Three samples were placed into formalin and 3 samples were placed into saline.  Once we had retrieved adequate tissue, all wires catheters and sheaths were we removed.  Manual compression was used for hemostasis.  The patient tolerated the procedure well and remained hemodynamically stable throughout.  No complications were encountered and no significant blood loss was encountered.  Marland Kitchen  FINDINGS: The right internal jugular vein is patent above the entry of right chest wall port catheter, below this and at the site of the catheter entry the vein is occluded.  Patent left IJ vein.  Confirmation of catheter tip within the right hepatic vein before biopsy retrieved.  At least 3 tissue biopsy were sent in both formalin and saline to the pathology lab.  IMPRESSION: Status post transjugular liver biopsy, with tissue specimen sent to pathology for complete histopathologic analysis.  Signed,  Dulcy Fanny. Earleen Newport, DO  [facility]  [facility]   Electronically Signed   By: Corrie Mckusick D.O.   On: 12/29/2014 19:19    Scheduled Meds: . glycopyrrolate  2 mg Oral BID  . lactulose  10 g Oral TID  . nicotine  7 mg Transdermal Daily  . pantoprazole  40 mg Oral Daily  . rifaximin  550 mg Oral BID  . sodium chloride  3 mL Intravenous Q12H  . sucralfate  1 g Oral TID WC & HS   Continuous Infusions:    Active Problems:   Breast carcinoma metastatic to pelvis   Essential hypertension   Breast cancer metastasized to multiple sites   Liver metastases   Intractable nausea and vomiting   Cancer associated pain   Cancer related pain   Arterial hypotension   Hepatic cirrhosis   Elevated LFTs   Ascites   Encephalopathy, hepatic  Marzetta Board, MD  [facility] Pager [phone]. If 7PM-7AM, please contact night-coverage at [email or web address], password [facility] 12/31/2014, 8:08 AM  LOS: 6 days

## 2015-01-01 ENCOUNTER — Telehealth: Payer: Self-pay | Admitting: Oncology

## 2015-01-01 DIAGNOSIS — Z170 Estrogen receptor positive status [ER+]: Secondary | ICD-10-CM

## 2015-01-01 DIAGNOSIS — Z7189 Other specified counseling: Secondary | ICD-10-CM

## 2015-01-01 LAB — COMPREHENSIVE METABOLIC PANEL
ALT: 203 U/L — ABNORMAL HIGH (ref 0–35)
ANION GAP: 10 (ref 5–15)
AST: 998 U/L — ABNORMAL HIGH (ref 0–37)
Albumin: 2.1 g/dL — ABNORMAL LOW (ref 3.5–5.2)
Alkaline Phosphatase: 844 U/L — ABNORMAL HIGH (ref 39–117)
BUN: 58 mg/dL — AB (ref 6–23)
CO2: 16 mmol/L — AB (ref 19–32)
CREATININE: 1.66 mg/dL — AB (ref 0.50–1.10)
Calcium: 9.5 mg/dL (ref 8.4–10.5)
Chloride: 110 mmol/L (ref 96–112)
GFR calc Af Amer: 41 mL/min — ABNORMAL LOW (ref >90)
GFR, EST NON AFRICAN AMERICAN: 36 mL/min — AB (ref >90)
GLUCOSE: 103 mg/dL — AB (ref 70–99)
Potassium: 4.7 mmol/L (ref 3.5–5.1)
Sodium: 136 mmol/L (ref 135–145)
TOTAL PROTEIN: 5.4 g/dL — AB (ref 6.0–8.3)
Total Bilirubin: 9.2 mg/dL — ABNORMAL HIGH (ref 0.3–1.2)

## 2015-01-01 LAB — CBC
HCT: 31.1 % — ABNORMAL LOW (ref 36.0–46.0)
HEMOGLOBIN: 10.2 g/dL — AB (ref 12.0–15.0)
MCH: 25.1 pg — ABNORMAL LOW (ref 26.0–34.0)
MCHC: 32.8 g/dL (ref 30.0–36.0)
MCV: 76.6 fL — AB (ref 78.0–100.0)
Platelets: 89 10*3/uL — ABNORMAL LOW (ref 150–400)
RBC: 4.06 MIL/uL (ref 3.87–5.11)
RDW: 18.5 % — ABNORMAL HIGH (ref 11.5–15.5)
WBC: 10.2 10*3/uL (ref 4.0–10.5)

## 2015-01-01 MED ORDER — LORAZEPAM 2 MG/ML IJ SOLN
0.5000 mg | Freq: Four times a day (QID) | INTRAMUSCULAR | Status: DC | PRN
Start: 2015-01-01 — End: 2015-01-03

## 2015-01-01 MED ORDER — PROCHLORPERAZINE EDISYLATE 5 MG/ML IJ SOLN: 10.0000 mg | Freq: Four times a day (QID) | INTRAMUSCULAR | Status: DC | PRN

## 2015-01-01 MED ORDER — MORPHINE SULFATE 2 MG/ML IJ SOLN
0.5000 mg | INTRAMUSCULAR | Status: DC | PRN
  Administered 2015-01-03 (×2): 0.5 mg via INTRAVENOUS
  Filled 2015-01-01 (×2): qty 1

## 2015-01-01 MED ORDER — PANTOPRAZOLE SODIUM 40 MG IV SOLR
40.0000 mg | INTRAVENOUS | Status: DC
  Administered 2015-01-01 – 2015-01-02 (×2): 40 mg via INTRAVENOUS
  Filled 2015-01-01: qty 40

## 2015-01-01 NOTE — Progress Notes (Signed)
Patient had increased confusion early this morning.  When morning labs resulted, paged Dr. Cruzita Lederer.  MD had discussion with family, and patient now DNR/comfort care.  Family at bedside throughout shift.  Have provided support for family throughout day.  Patient's BP low, MD aware.  Palliative cart brought to bedside.  Family refused hospital chaplain service, but had personal pastor in room.  Patient appears comfortable, although very lethargic and confused when awake.  Will give report to night nurse and continue to monitor.

## 2015-01-01 NOTE — Progress Notes (Signed)
Tammy Blanchard   DOB:[birth year]   QV#:[id number]   PPI#:[id number]  Subjective: Tammy Blanchard is lethargic, able to smile, but not able to clearly answer me when I ask her if she is hurting. Familt in room   Objective: middle agead African American woman examined in bed Filed Vitals:   01/01/15 1400  BP: 83/58  Pulse: 109  Temp: 99 F (37.2 C)  Resp: 20    Body mass index is 26.09 kg/(m^2).  Intake/Output Summary (Last 24 hours) at 01/01/15 1702 Last data filed at 01/01/15 0909  Gross per 24 hour  Intake    120 ml  Output      0 ml  Net    120 ml    CBG (last 3)  No results for input(s): GLUCAP in the last 72 hours.   Labs:  Lab Results  Component Value Date   WBC 10.2 01/01/2015   HGB 10.2* 01/01/2015   HCT 31.1* 01/01/2015   MCV 76.6* 01/01/2015   PLT 89* 01/01/2015   NEUTROABS 1.6 12/19/2014    '@LASTCHEMISTRY' @  Urine Studies No results for input(s): UHGB, CRYS in the last 72 hours.  Invalid input(s): UACOL, UAPR, USPG, UPH, UTP, UGL, UKET, UBIL, UNIT, UROB, [place], UEPI, UWBC, Tammy Blanchard, Tammy Blanchard  Basic Metabolic Panel:  Recent Labs Lab 12/28/14 0341 12/29/14 0515 12/30/14 0520 12/31/14 0510 01/01/15 0913  NA 138 139 139 134* 136  K 3.9 4.0 4.4 4.3 4.7  CL 114* 110 112 108 110  CO2 19 18* 18* 16* 16*  GLUCOSE 96 105* 101* 101* 103*  BUN 32* 32* 39* 43* 58*  CREATININE 0.91 0.89 1.00 1.33* 1.66*  CALCIUM 9.2 9.7 9.9 9.2 9.5  MG  --   --   --  2.3  --   PHOS  --   --   --  4.6  --    GFR Estimated Creatinine Clearance: 38.5 mL/min (by C-G formula based on Cr of 1.66). Liver Function Tests:  Recent Labs Lab 12/28/14 0341 12/29/14 0515 12/30/14 0520 12/31/14 0510 01/01/15 0913  AST 502* 456* 480* 521* 998*  ALT 122* 114* 113* 121* 203*  ALKPHOS 758* 711* 763* 762* 844*  BILITOT 4.8* 5.6* 6.7* 7.4* 9.2*  PROT 5.6* 5.6* 5.5* 5.3* 5.4*  ALBUMIN 2.4* 2.4* 2.4* 2.2* 2.1*   No results for input(s): LIPASE, AMYLASE in the last 168  hours.  Recent Labs Lab 12/28/14 0345 12/30/14 0520 12/31/14 0510  AMMONIA 108* 71* 98*   Coagulation profile  Recent Labs Lab 12/28/14 0640 12/31/14 0510  INR 1.71* 1.47    CBC:  Recent Labs Lab 12/28/14 0341 12/29/14 0515 12/30/14 0520 12/31/14 0510 01/01/15 0913  WBC 5.4 6.1 7.0 7.0 10.2  HGB 9.7* 10.0* 10.0* 9.8* 10.2*  HCT 30.8* 31.3* 31.3* 30.2* 31.1*  MCV 78.6 78.1 77.1* 77.4* 76.6*  PLT 145* 145* 118* 125* 89*   Cardiac Enzymes: No results for input(s): CKTOTAL, CKMB, CKMBINDEX, TROPONINI in the last 168 hours. BNP: Invalid input(s): POCBNP CBG: No results for input(s): GLUCAP in the last 168 hours. D-Dimer No results for input(s): DDIMER in the last 72 hours. Hgb A1c No results for input(s): HGBA1C in the last 72 hours. Lipid Profile No results for input(s): CHOL, HDL, LDLCALC, TRIG, CHOLHDL, LDLDIRECT in the last 72 hours. Thyroid function studies No results for input(s): TSH, T4TOTAL, T3FREE, THYROIDAB in the last 72 hours.  Invalid input(s): FREET3 Anemia work up No results for input(s): VITAMINB12, FOLATE, FERRITIN, TIBC, IRON, RETICCTPCT  in the last 72 hours. Microbiology Recent Results (from the past 240 hour(s))  Urine culture     Status: None   Collection Time: 12/25/14  1:53 AM  Result Value Ref Range Status   Specimen Description URINE, CLEAN CATCH  Final   Special Requests NONE  Final   Colony Count   Final    >=100,000 COLONIES/ML Performed at [facility]    Culture   Final    Multiple bacterial morphotypes present, none predominant. Suggest appropriate recollection if clinically indicated. Performed at [facility]    Report Status 12/26/2014 FINAL  Final  Clostridium Difficile by PCR     Status: None   Collection Time: 12/27/14  1:28 PM  Result Value Ref Range Status   C difficile by pcr NEGATIVE NEGATIVE Final    Comment: Performed at [facility]  Body fluid culture     Status: None   Collection  Time: 12/27/14  3:42 PM  Result Value Ref Range Status   Specimen Description ASCITIC  Final   Special Requests NONE  Final   Gram Stain   Final    RARE WBC PRESENT, PREDOMINANTLY MONONUCLEAR NO ORGANISMS SEEN Performed at [facility]    Culture   Final    NO GROWTH 3 DAYS Performed at [facility]    Report Status 12/31/2014 FINAL  Final      Studies:  Ct Head Wo Contrast  12/25/2014   CLINICAL DATA:  Confusion. Previous CT abdomen and pelvis with IV contrast material at 2213 hr on 12/24/2014. History of breast cancer.  EXAM: CT HEAD WITHOUT CONTRAST  TECHNIQUE: Contiguous axial images were obtained from the base of the skull through the vertex without intravenous contrast.  COMPARISON:  02/16/2014  FINDINGS: Focal encephalomalacia in the right anterior periventricular white matter is unchanged since previous study and likely represents focal area of ischemia. No mass effect or midline shift. No abnormal extra-axial fluid collections. Gray-white matter junctions are distinct. Basal cisterns are not effaced. No evidence of acute intracranial hemorrhage. No depressed skull fractures. Diffuse heterogeneous appearance of the calvarium consistent with known metastases. Visualized paranasal sinuses and mastoid air cells are not opacified.  IMPRESSION: Unchanged appearance of chronic white matter encephalomalacia, likely ischemic. Diffuse bone metastases again demonstrated. No acute intracranial abnormalities.   Electronically Signed   By: Lucienne Capers M.D.   On: 12/25/2014 00:55   Ct Abdomen Pelvis W Contrast  12/24/2014   EXAM: CT ABDOMEN AND PELVIS WITH CONTRAST  TECHNIQUE: Multidetector CT imaging of the abdomen and pelvis was performed using the standard protocol following bolus administration of intravenous contrast.  CONTRAST:  150m OMNIPAQUE IOHEXOL 300 MG/ML  SOLN  COMPARISON:  Prior MRI from 12/20/2014 as well as CT from 11/08/2014.  FINDINGS: Small bilateral pleural  effusions with associated bibasilar atelectasis is present. No pericardial effusion.  Previously identified macroscopic hepatic metastases are grossly stable relative to recent MRI. For reference purposes, the largest lesion in the right hepatic lobe measures 2.9 x 2.1 cm. Again, the liver demonstrates a diffusely heterogeneous appearance with nodular contour, suggestive of cirrhosis or possibly pseudo cirrhosis. These changes are grossly similar relative to recent MRI.  Cholelithiasis again noted within the gallbladder lumen. No biliary dilatation. Spleen within normal limits. Adrenal glands and pancreas are normal.  1.7 cm cyst present within the left kidney. No nephrolithiasis, hydronephrosis, or focal enhancing renal mass.  Mild circumferential wall thickening noted within the partially visualized distal esophagus, most  commonly related to reflex. Stomach otherwise unremarkable. No evidence for bowel obstruction. No definite acute inflammatory changes seen about the bowels.  Mild circumferential bladder wall thickening. 7 mm hyperdensity at the posterior aspect of the bladder lumen is stable from prior study.  Uterus and ovaries are not visualized.  Moderate volume ascites again seen within the abdomen and pelvis. No frank peritoneal implant or omental caking identified.  No free air.  No pathologically enlarged intra-abdominal pelvic lymph nodes identified.  Widespread diffuse osseous metastases again seen. No pathologic fracture.  IMPRESSION: 1. Stable appearance of macroscopic intrahepatic metastases as compared to recent MRI from 12/20/2014. 2. Diffusely heterogeneous and nodular contour of the liver, again suggestive of cirrhosis or possibly pseudo cirrhosis. These findings are stable relative to recent MRI. 3. Moderate volume ascites within the abdomen and pelvis. 4. Small bilateral pleural effusions with associated bibasilar atelectasis. 5. Diffuse esophageal wall thickening within the distal esophagus.  Finding may be related to post radiation changes or possibly reflux. 6. Widespread diffuse osseous metastatic disease without pathologic fracture. 7. Cholelithiasis.   Electronically Signed   By: Jeannine Boga M.D.   On: 12/24/2014 22:53   Ir Transcatheter Bx  12/29/2014   CLINICAL DATA:  50 year old female with a history of metastatic breast carcinoma.  She has had rapid deterioration of her liver, with imaging differential diagnosis of cirrhosis or potentially diffuse breast metastases. She has been referred for liver biopsy, and given her malignant ascites, the biopsy will be done trans jugular.  EXAM: ULTRASOUND GUIDANCE FOR VASCULAR ACCESS  FLUOROSCOPIC GUIDANCE FOR TRANSJUGULAR LIVER BIOPSY.  Date:  2/26/20162/26/2016 7:04 pm  FLUOROSCOPY TIME:  7 MINUTES, 36 SECONDS 169 mGy  MEDICATIONS AND MEDICAL HISTORY: 3.0 mg Versed, 150 mcg fentanyl  ANESTHESIA/SEDATION: 45 minutes  CONTRAST:  60m OMNIPAQUE IOHEXOL 300 MG/ML  SOLN  COMPLICATIONS: None  PROCEDURE: Informed consent was obtained from the patient following explanation of the procedure, risks, benefits and alternatives. The patient understands, agrees and consents for the procedure. All questions were addressed. A time out was performed.  Maximal barrier sterile technique utilized including caps, mask, sterile gowns, sterile gloves, large sterile drape, hand hygiene, and betadine prep.  The patient's right neck was prepped and draped in the usual sterile fashion. Ultrasound survey demonstrated a patent right internal jugular vein above the level of the entry of a indwelling right port catheter into the IJ vein. Once access was achieved with ultrasound guidance, the vein was found to be occluded as there was inability to pass the wire.  The patient's left neck was then prepped and draped in usual sterile fashion. Skin and subcutaneous tissues were infiltrated 1% lidocaine. Ultrasound guidance was used access the left internal jugular vein. 018  micro wire was passed into the right heart under fluoroscopy.  Incision was made on the needle on the needle was removed over the wire. Micropuncture set was then placed over the micro wire. The inner dilator and cannula were removed along with the wire, and an 035 Bentson wire was placed into the right heart. A 9 French short sheath was then placed over the wire. Through the short sheath, the Bentson wire and a Kumpe E were used to navigate into the IVC. An 061Bentson wire was then placed into the IVC. Dilation of the tissue tract with an 138French dilator was performed after removing the short sheath, and then a 11 [place] sheath was placed into the right atrium. A Kumpe the catheter and Bentson  wire were then navigated into the right hepatic vein. Venogram was performed.  A stiff Amplatz wire was then advanced into the right hepatic vein. The Kumpe the catheter was removed, and then the guide sheath for the transjugular biopsied sat was advanced into the right hepatic vein. We confirmed that the catheter was lodged against the side wall with a small amount of contrast infusion, and then several biopsies were retrieved. Three samples were placed into formalin and 3 samples were placed into saline.  Once we had retrieved adequate tissue, all wires catheters and sheaths were we removed.  Manual compression was used for hemostasis.  The patient tolerated the procedure well and remained hemodynamically stable throughout.  No complications were encountered and no significant blood loss was encountered.  Marland Kitchen  FINDINGS: The right internal jugular vein is patent above the entry of right chest wall port catheter, below this and at the site of the catheter entry the vein is occluded.  Patent left IJ vein.  Confirmation of catheter tip within the right hepatic vein before biopsy retrieved.  At least 3 tissue biopsy were sent in both formalin and saline to the pathology lab.  IMPRESSION: Status post transjugular liver  biopsy, with tissue specimen sent to pathology for complete histopathologic analysis.  Signed,  Dulcy Fanny. Earleen Newport, DO  [facility]  [facility]   Electronically Signed   By: Corrie Mckusick D.O.   On: 12/29/2014 19:19   Mr Liver W Wo Contrast  12/20/2014   CLINICAL DATA:  Follow up liver lesions. History of breast cancer. Subsequent encounter.  EXAM: MRI ABDOMEN WITHOUT AND WITH CONTRAST  TECHNIQUE: Multiplanar multisequence MR imaging of the abdomen was performed both before and after the administration of intravenous contrast.  CONTRAST:  73m MULTIHANCE GADOBENATE DIMEGLUMINE 529 MG/ML IV SOLN  COMPARISON:  CTs 11/08/2014 and 06/28/2014. MRI 02/07/2014. PET-CT 09/19/2013.  FINDINGS: Lower chest: Trace bilateral pleural effusions. Otherwise unremarkable.  Hepatobiliary: Compared with the prior MRI and interval CTs, the liver has undergone a gradual but dramatic change. There is progressive lobularity of the hepatic contours with relative enlargement of the left and caudate lobes consistent with cirrhosis. The previously referenced macroscopic hepatic metastatic disease demonstrates continued slight improvement. For example, adjacent lesions anteriorly in the right hepatic lobe measure 2.0 x 1.4 cm and 1.5 x 1.3 cm on images 18 and 19 of series 1103. This compares with 2.1 x 1.6 cm and 1.7 x 1.4 cm on the most recent CT. A lesion superiorly in the right hepatic lobe measures 1.4 cm on image 10 (previously 1.7 cm). These lesions demonstrate central necrosis and peripheral enhancement. There is a background of diffuse hepatic nodularity which demonstrates confluent T2 signal, diffuse enhancement following contrast and diffuse restricted diffusion. Gallstones again noted. There is no gallbladder wall thickening or biliary dilatation.  Pancreas: Unremarkable. No pancreatic ductal dilatation or surrounding inflammatory changes.  Spleen: Normal in size without focal  abnormality.  Adrenals/Urinary Tract: Both adrenal glands appear normal.Small left renal cysts are noted. The right kidney appears normal. There is no hydronephrosis.  Stomach/Bowel: Mild diffuse bowel wall thickening, likely related to the liver disease. No significant bowel distention or focal lesion identified. The transverse colon is collapsed.  Vascular/Lymphatic: There are no enlarged abdominal lymph nodes. No significant arterial abnormalities identified. There is extrinsic mass effect on the intrahepatic portion of the IVC and the hepatic veins. The portal vein is patent. No large vessel occlusion identified.  Other: Interval development of  moderate ascites. There is thin peritoneal enhancement without demonstrated discrete peritoneal or omental nodularity.  Musculoskeletal: No acute or significant osseous findings.  IMPRESSION: 1. The previously referenced partially treated macroscopic hepatic metastatic disease demonstrates continued improvement. 2. However, there is rapidly progressive extensive background hepatic abnormality with diffuse contour irregularity and confluent nodularity consistent with macronodular cirrhosis or pseudocirrhosis. The individual nodules may represent regenerating nodules, and no dominant lesion is identified to suggest hepatocellular carcinoma. Given this background abnormality, recurrent metastatic disease is difficult to completely exclude. If that is a clinical concern, random liver biopsy should be considered. Continued follow-up with CT or MRI also recommended. 3. Interval development of moderate ascites without definite peritoneal/omental recurrence.   Electronically Signed   By: Richardean Sale M.D.   On: 12/20/2014 10:53   US Paracentesis  12/27/2014   INDICATION: Stage IV breast cancer, imaging findings suggestive of cirrhosis, ascites. Request is made for diagnostic and therapeutic paracentesis.  EXAM: ULTRASOUND-GUIDED DIAGNOSTIC AND THERAPEUTIC PARACENTESIS   COMPARISON:  None.  MEDICATIONS: None.  COMPLICATIONS: None immediate  TECHNIQUE: Informed written consent was obtained from the patient after a discussion of the risks, benefits and alternatives to treatment. A timeout was performed prior to the initiation of the procedure.  Initial ultrasound scanning demonstrates a small to moderate amount of ascites within the left lower abdominal quadrant. The left lower abdomen was prepped and draped in the usual sterile fashion. 1% lidocaine was used for local anesthesia. Under direct ultrasound guidance, a 19 gauge, 10-cm, Yueh catheter was introduced. An ultrasound image was saved for documentation purposed. The paracentesis was performed. The catheter was removed and a dressing was applied. The patient tolerated the procedure well without immediate post procedural complication.  FINDINGS: A total of approximately 2.2 liters of slightly turbid, yellow fluid was removed. Samples were sent to the laboratory as requested by the clinical team.  IMPRESSION: Successful ultrasound-guided diagnostic and therapeutic paracentesis yielding 2.2 liters of peritoneal fluid.  Read by: Rowe Robert, PA-C   Electronically Signed   By: Marybelle Killings M.D.   On: 12/27/2014 15:51   Ir US Guide Vasc Access Left  12/29/2014   CLINICAL DATA:  48 year old female with a history of metastatic breast carcinoma.  She has had rapid deterioration of her liver, with imaging differential diagnosis of cirrhosis or potentially diffuse breast metastases. She has been referred for liver biopsy, and given her malignant ascites, the biopsy will be done trans jugular.  EXAM: ULTRASOUND GUIDANCE FOR VASCULAR ACCESS  FLUOROSCOPIC GUIDANCE FOR TRANSJUGULAR LIVER BIOPSY.  Date:  2/26/20162/26/2016 7:04 pm  FLUOROSCOPY TIME:  7 MINUTES, 36 SECONDS 169 mGy  MEDICATIONS AND MEDICAL HISTORY: 3.0 mg Versed, 150 mcg fentanyl  ANESTHESIA/SEDATION: 45 minutes  CONTRAST:  17m OMNIPAQUE IOHEXOL 300 MG/ML  SOLN   COMPLICATIONS: None  PROCEDURE: Informed consent was obtained from the patient following explanation of the procedure, risks, benefits and alternatives. The patient understands, agrees and consents for the procedure. All questions were addressed. A time out was performed.  Maximal barrier sterile technique utilized including caps, mask, sterile gowns, sterile gloves, large sterile drape, hand hygiene, and betadine prep.  The patient's right neck was prepped and draped in the usual sterile fashion. Ultrasound survey demonstrated a patent right internal jugular vein above the level of the entry of a indwelling right port catheter into the IJ vein. Once access was achieved with ultrasound guidance, the vein was found to be occluded as there was inability to pass the wire.  The  patient's left neck was then prepped and draped in usual sterile fashion. Skin and subcutaneous tissues were infiltrated 1% lidocaine. Ultrasound guidance was used access the left internal jugular vein. 018 micro wire was passed into the right heart under fluoroscopy.  Incision was made on the needle on the needle was removed over the wire. Micropuncture set was then placed over the micro wire. The inner dilator and cannula were removed along with the wire, and an 035 Bentson wire was placed into the right heart. A 9 French short sheath was then placed over the wire. Through the short sheath, the Bentson wire and a Kumpe E were used to navigate into the IVC. An 56 Bentson wire was then placed into the IVC. Dilation of the tissue tract with an 25 French dilator was performed after removing the short sheath, and then a 11 [place] TIPS sheath was placed into the right atrium. A Kumpe the catheter and Bentson wire were then navigated into the right hepatic vein. Venogram was performed.  A stiff Amplatz wire was then advanced into the right hepatic vein. The Kumpe the catheter was removed, and then the guide sheath for the transjugular biopsied sat  was advanced into the right hepatic vein. We confirmed that the catheter was lodged against the side wall with a small amount of contrast infusion, and then several biopsies were retrieved. Three samples were placed into formalin and 3 samples were placed into saline.  Once we had retrieved adequate tissue, all wires catheters and sheaths were we removed.  Manual compression was used for hemostasis.  The patient tolerated the procedure well and remained hemodynamically stable throughout.  No complications were encountered and no significant blood loss was encountered.  Marland Kitchen  FINDINGS: The right internal jugular vein is patent above the entry of right chest wall port catheter, below this and at the site of the catheter entry the vein is occluded.  Patent left IJ vein.  Confirmation of catheter tip within the right hepatic vein before biopsy retrieved.  At least 3 tissue biopsy were sent in both formalin and saline to the pathology lab.  IMPRESSION: Status post transjugular liver biopsy, with tissue specimen sent to pathology for complete histopathologic analysis.  Signed,  Dulcy Fanny. Earleen Newport, DO  [facility]  [facility]   Electronically Signed   By: Corrie Mckusick D.O.   On: 12/29/2014 19:19    Assessment: 49 y.o. [place] woman with stage IV breast cancer (January 2014) involving bones, uterus and ovaries, liver, abdomen (carcinomatosis) and skin  (1) status post bilateral breast biopsies in August 2010. On the left, only atypical ductal hyperplasia. On the right upper outer quadrant, high-grade invasive ductal carcinoma, clincally T2 N0, stage IIA  (2) Treated in the neoadjuvant setting with docetaxel, doxorubicin, and cyclophosphamide x6, chemotherapy completed in December 2010.   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 However  three out of 10 lymph nodes were positive. ypTis ypN1, stage IIB. Tumor was strongly estrogen receptor/progesterone receptor positive, HER2/neu negative with a high proliferation fraction.   (4) adjuvant radiation therapy, completed May 2011,   (5) on tamoxifen May 2011 to January 2014 when she was found to have stage IV disease  (6) s/p TAH-BSO 11/25/2012 with metastatic brast cancer, estrogen receptor 30% and progestrerone receptor 20% positive, HER-2 negative  (7) anastrozole started February 2014, discontinued in  April 2014 due to poor tolerance  (8) patient started letrozole in mid May 2014, discontinued August 2014 with progression  (9) zoledronic acid given initially every 28 days for bony metastatic disease, first dose in May 2014; changed to every 6 weeks beginning 02/28/2014, now given every 12 weeks-- most recent dose 10/04/2014  (10) skin involvement over the left breast and possibly other distant skin sites noted June 2014, with  (a) biopsy of the left breast and a left axillary node 04/29/2013 confirming 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  (11) status post bilateral mastectomies 05/25/2013, showing: (a) on the left, mypT1c NX invasive mammary carcinoma, with ductal and lobular features, grade 1, repeat HER-2 again negative (b) on the right, yp T2 NX invasive lobular breast cancer, grade 1, with negative margins, and HER-2 again negative  (12) right chest wall skin biopsy and right neck biopsy both positive for metastatic breast cancer  (13) fulvestrant  at 500 mg monthly started 06/10/2013, last dose 01/17/2014, with progression  (14) Hot flashes, improving on clonidine at bedtime  (15) Depression, started oral antidepressants, not taking.  (16) Skin lesion, right arm  (17) thyroid nodule - biopsy 02/28/2014 benign  (18) Abraxane, first dose 02/21/2014, to be given day 1 and day 8 of every 21 day cycle, with restaging after 4 cycles showing stable/improved disease; carboplatin added to day one beginning with cycle 5  (19) Patient has progressed on Carbo/Abraxane and started Eribulin Mesylate on 07/04/14, stopped after one cycle because of worsening neuropathy symptoms  (20) exemestane and everolimus started 07/28/2014; everolimus dose dropped to 43m starting 09/04/2014 and stopped 12/15/2014 with elevated LFTs  (21) dysphagia - swallowing study may 2015 showed esophageal narrowing, no mass; S/P esophageal dilatation with improvement.   (22) apparent cirrhosis on scans with biopsy 12/29/2014 results pending   Plan: .Marland KitchenCKhamilleis encephalopathic. She was unable to tell me clearly whether or not she was in pain. At this point she seems not to be in pain--she appears comfortable. I have asked the family to let uKoreaknow if they feel CNikoletteis becoming uncomfortable in which case I would suggest using the IV dilaudid ratehr than the po--not clear how much she will absorb of any of ehr oral medications.  I have slightly simplified her meds and changed some to IV.  Greatly appreciate your help to this patient and her family. Will follow with you   MChauncey Cruel MD 01/01/2015  5:02 PM [facility] [id number]. [phone]   Fax. [phone]

## 2015-01-01 NOTE — Progress Notes (Signed)
Tammy Blanchard   DOB:1966/03/04   WL#:[id number]   RDE#:[id number]  Subjective: [name] is a bit confused this AM; persevers in answers; denies pain; family in room   Objective: middle agead African American woman examined in bed Filed Vitals:   01/01/15 0534  BP: 90/58  Pulse: 104  Temp: 99.3 F (37.4 C)  Resp: 20    Body mass index is 26.09 kg/(m^2).  Intake/Output Summary (Last 24 hours) at 01/01/15 0726 Last data filed at 12/31/14 1454  Gross per 24 hour  Intake    680 ml  Output      0 ml  Net    680 ml     Lungs clear -- auscultated anterolaterally  Heart regular rate and rhythm  Abdomen massively distended, NT, positive bowel sounds  Neuro nonfocal; mildly to moderately encephalopathic    CBG (last 3)  No results for input(s): GLUCAP in the last 72 hours.   Labs:  Lab Results  Component Value Date   WBC 7.0 12/31/2014   HGB 9.8* 12/31/2014   HCT 30.2* 12/31/2014   MCV 77.4* 12/31/2014   PLT 125* 12/31/2014   NEUTROABS 1.6 12/19/2014    '@LASTCHEMISTRY' @  Urine Studies No results for input(s): UHGB, CRYS in the last 72 hours.  Invalid input(s): UACOL, UAPR, USPG, UPH, UTP, UGL, UKET, UBIL, UNIT, UROB, [place], UEPI, UWBC, Duwayne Blanchard [place], [place]  Basic Metabolic Panel:  Recent Labs Lab 12/27/14 0515 12/28/14 0341 12/29/14 0515 12/30/14 0520 12/31/14 0510  NA 141 138 139 139 134*  K 4.0 3.9 4.0 4.4 4.3  CL 110 114* 110 112 108  CO2 19 19 18* 18* 16*  GLUCOSE 100* 96 105* 101* 101*  BUN 31* 32* 32* 39* 43*  CREATININE 1.02 0.91 0.89 1.00 1.33*  CALCIUM 9.6 9.2 9.7 9.9 9.2  MG  --   --   --   --  2.3  PHOS  --   --   --   --  4.6   GFR Estimated Creatinine Clearance: 48 mL/min (by C-G formula based on Cr of 1.33). Liver Function Tests:  Recent Labs Lab 12/27/14 0515 12/28/14 0341 12/29/14 0515 12/30/14 0520 12/31/14 0510  AST 404* 502* 456* 480* 521*  ALT 114* 122* 114* 113* 121*  ALKPHOS 791* 758* 711* 763* 762*  BILITOT 4.8* 4.8*  5.6* 6.7* 7.4*  PROT 5.9* 5.6* 5.6* 5.5* 5.3*  ALBUMIN 2.7* 2.4* 2.4* 2.4* 2.2*   No results for input(s): LIPASE, AMYLASE in the last 168 hours.  Recent Labs Lab 12/28/14 0345 12/30/14 0520 12/31/14 0510  AMMONIA 108* 71* 98*   Coagulation profile  Recent Labs Lab 12/28/14 0640 12/31/14 0510  INR 1.71* 1.47    CBC:  Recent Labs Lab 12/27/14 0515 12/28/14 0341 12/29/14 0515 12/30/14 0520 12/31/14 0510  WBC 4.6 5.4 6.1 7.0 7.0  HGB 9.8* 9.7* 10.0* 10.0* 9.8*  HCT 31.3* 30.8* 31.3* 31.3* 30.2*  MCV 78.6 78.6 78.1 77.1* 77.4*  PLT 170 145* 145* 118* 125*   Cardiac Enzymes: No results for input(s): CKTOTAL, CKMB, CKMBINDEX, TROPONINI in the last 168 hours. BNP: Invalid input(s): POCBNP CBG: No results for input(s): GLUCAP in the last 168 hours. D-Dimer No results for input(s): DDIMER in the last 72 hours. Hgb A1c No results for input(s): HGBA1C in the last 72 hours. Lipid Profile No results for input(s): CHOL, HDL, LDLCALC, TRIG, CHOLHDL, LDLDIRECT in the last 72 hours. Thyroid function studies No results for input(s): TSH, T4TOTAL, T3FREE, THYROIDAB in  the last 72 hours.  Invalid input(s): FREET3 Anemia work up No results for input(s): VITAMINB12, FOLATE, FERRITIN, TIBC, IRON, RETICCTPCT in the last 72 hours. Microbiology Recent Results (from the past 240 hour(s))  Urine culture     Status: None   Collection Time: 12/25/14  1:53 AM  Result Value Ref Range Status   Specimen Description URINE, CLEAN CATCH  Final   Special Requests NONE  Final   Colony Count   Final    >=100,000 COLONIES/ML Performed at [facility]    Culture   Final    Multiple bacterial morphotypes present, none predominant. Suggest appropriate recollection if clinically indicated. Performed at [facility]    Report Status 12/26/2014 FINAL  Final  Clostridium Difficile by PCR     Status: None   Collection Time: 12/27/14  1:28 PM  Result Value Ref Range Status   C  difficile by pcr NEGATIVE NEGATIVE Final    Comment: Performed at [facility]  Body fluid culture     Status: None   Collection Time: 12/27/14  3:42 PM  Result Value Ref Range Status   Specimen Description ASCITIC  Final   Special Requests NONE  Final   Gram Stain   Final    RARE WBC PRESENT, PREDOMINANTLY MONONUCLEAR NO ORGANISMS SEEN Performed at [facility]    Culture   Final    NO GROWTH 3 DAYS Performed at [facility]    Report Status 12/31/2014 FINAL  Final      Studies:  Ct Head Wo Contrast  12/25/2014   CLINICAL DATA:  Confusion. Previous CT abdomen and pelvis with IV contrast material at 2213 hr on 12/24/2014. History of breast cancer.  EXAM: CT HEAD WITHOUT CONTRAST  TECHNIQUE: Contiguous axial images were obtained from the base of the skull through the vertex without intravenous contrast.  COMPARISON:  02/16/2014  FINDINGS: Focal encephalomalacia in the right anterior periventricular white matter is unchanged since previous study and likely represents focal area of ischemia. No mass effect or midline shift. No abnormal extra-axial fluid collections. Gray-white matter junctions are distinct. Basal cisterns are not effaced. No evidence of acute intracranial hemorrhage. No depressed skull fractures. Diffuse heterogeneous appearance of the calvarium consistent with known metastases. Visualized paranasal sinuses and mastoid air cells are not opacified.  IMPRESSION: Unchanged appearance of chronic white matter encephalomalacia, likely ischemic. Diffuse bone metastases again demonstrated. No acute intracranial abnormalities.   Electronically Signed   By: Lucienne Capers M.D.   On: 12/25/2014 00:55   Ct Abdomen Pelvis W Contrast  12/24/2014   EXAM: CT ABDOMEN AND PELVIS WITH CONTRAST  TECHNIQUE: Multidetector CT imaging of the abdomen and pelvis was performed using the standard protocol following bolus administration of intravenous contrast.  CONTRAST:  172m  OMNIPAQUE IOHEXOL 300 MG/ML  SOLN  COMPARISON:  Prior MRI from 12/20/2014 as well as CT from 11/08/2014.  FINDINGS: Small bilateral pleural effusions with associated bibasilar atelectasis is present. No pericardial effusion.  Previously identified macroscopic hepatic metastases are grossly stable relative to recent MRI. For reference purposes, the largest lesion in the right hepatic lobe measures 2.9 x 2.1 cm. Again, the liver demonstrates a diffusely heterogeneous appearance with nodular contour, suggestive of cirrhosis or possibly pseudo cirrhosis. These changes are grossly similar relative to recent MRI.  Cholelithiasis again noted within the gallbladder lumen. No biliary dilatation. Spleen within normal limits. Adrenal glands and pancreas are normal.  1.7 cm cyst present within the left kidney.  No nephrolithiasis, hydronephrosis, or focal enhancing renal mass.  Mild circumferential wall thickening noted within the partially visualized distal esophagus, most commonly related to reflex. Stomach otherwise unremarkable. No evidence for bowel obstruction. No definite acute inflammatory changes seen about the bowels.  Mild circumferential bladder wall thickening. 7 mm hyperdensity at the posterior aspect of the bladder lumen is stable from prior study.  Uterus and ovaries are not visualized.  Moderate volume ascites again seen within the abdomen and pelvis. No frank peritoneal implant or omental caking identified.  No free air.  No pathologically enlarged intra-abdominal pelvic lymph nodes identified.  Widespread diffuse osseous metastases again seen. No pathologic fracture.  IMPRESSION: 1. Stable appearance of macroscopic intrahepatic metastases as compared to recent MRI from 12/20/2014. 2. Diffusely heterogeneous and nodular contour of the liver, again suggestive of cirrhosis or possibly pseudo cirrhosis. These findings are stable relative to recent MRI. 3. Moderate volume ascites within the abdomen and pelvis. 4.  Small bilateral pleural effusions with associated bibasilar atelectasis. 5. Diffuse esophageal wall thickening within the distal esophagus. Finding may be related to post radiation changes or possibly reflux. 6. Widespread diffuse osseous metastatic disease without pathologic fracture. 7. Cholelithiasis.   Electronically Signed   By: Jeannine Boga M.D.   On: 12/24/2014 22:53   Ir Transcatheter Bx  12/29/2014   CLINICAL DATA:  49 year old female with a history of metastatic breast carcinoma.  She has had rapid deterioration of her liver, with imaging differential diagnosis of cirrhosis or potentially diffuse breast metastases. She has been referred for liver biopsy, and given her malignant ascites, the biopsy will be done trans jugular.  EXAM: ULTRASOUND GUIDANCE FOR VASCULAR ACCESS  FLUOROSCOPIC GUIDANCE FOR TRANSJUGULAR LIVER BIOPSY.  Date:  2/26/20162/26/2016 7:04 pm  FLUOROSCOPY TIME:  7 MINUTES, 36 SECONDS 169 mGy  MEDICATIONS AND MEDICAL HISTORY: 3.0 mg Versed, 150 mcg fentanyl  ANESTHESIA/SEDATION: 45 minutes  CONTRAST:  24m OMNIPAQUE IOHEXOL 300 MG/ML  SOLN  COMPLICATIONS: None  PROCEDURE: Informed consent was obtained from the patient following explanation of the procedure, risks, benefits and alternatives. The patient understands, agrees and consents for the procedure. All questions were addressed. A time out was performed.  Maximal barrier sterile technique utilized including caps, mask, sterile gowns, sterile gloves, large sterile drape, hand hygiene, and betadine prep.  The patient's right neck was prepped and draped in the usual sterile fashion. Ultrasound survey demonstrated a patent right internal jugular vein above the level of the entry of a indwelling right port catheter into the IJ vein. Once access was achieved with ultrasound guidance, the vein was found to be occluded as there was inability to pass the wire.  The patient's left neck was then prepped and draped in usual sterile  fashion. Skin and subcutaneous tissues were infiltrated 1% lidocaine. Ultrasound guidance was used access the left internal jugular vein. 018 micro wire was passed into the right heart under fluoroscopy.  Incision was made on the needle on the needle was removed over the wire. Micropuncture set was then placed over the micro wire. The inner dilator and cannula were removed along with the wire, and an 035 Bentson wire was placed into the right heart. A 9 French short sheath was then placed over the wire. Through the short sheath, the Bentson wire and a Kumpe E were used to navigate into the IVC. An 053Bentson wire was then placed into the IVC. Dilation of the tissue tract with an 141French dilator was performed after removing the  short sheath, and then a 11 French TIPS sheath was placed into the right atrium. A Kumpe the catheter and Bentson wire were then navigated into the right hepatic vein. Venogram was performed.  A stiff Amplatz wire was then advanced into the right hepatic vein. The Kumpe the catheter was removed, and then the guide sheath for the transjugular biopsied sat was advanced into the right hepatic vein. We confirmed that the catheter was lodged against the side wall with a small amount of contrast infusion, and then several biopsies were retrieved. Three samples were placed into formalin and 3 samples were placed into saline.  Once we had retrieved adequate tissue, all wires catheters and sheaths were we removed.  Manual compression was used for hemostasis.  The patient tolerated the procedure well and remained hemodynamically stable throughout.  No complications were encountered and no significant blood loss was encountered.  Marland Kitchen  FINDINGS: The right internal jugular vein is patent above the entry of right chest wall port catheter, below this and at the site of the catheter entry the vein is occluded.  Patent left IJ vein.  Confirmation of catheter tip within the right hepatic vein before biopsy  retrieved.  At least 3 tissue biopsy were sent in both formalin and saline to the pathology lab.  IMPRESSION: Status post transjugular liver biopsy, with tissue specimen sent to pathology for complete histopathologic analysis.  Signed,  Dulcy Fanny. Earleen Newport, DO  [facility]  [facility]   Electronically Signed   By: Corrie Mckusick D.O.   On: 12/29/2014 19:19   Mr Liver W Wo Contrast  12/20/2014   CLINICAL DATA:  Follow up liver lesions. History of breast cancer. Subsequent encounter.  EXAM: MRI ABDOMEN WITHOUT AND WITH CONTRAST  TECHNIQUE: Multiplanar multisequence MR imaging of the abdomen was performed both before and after the administration of intravenous contrast.  CONTRAST:  37m MULTIHANCE GADOBENATE DIMEGLUMINE 529 MG/ML IV SOLN  COMPARISON:  CTs 11/08/2014 and 06/28/2014. MRI 02/07/2014. PET-CT 09/19/2013.  FINDINGS: Lower chest: Trace bilateral pleural effusions. Otherwise unremarkable.  Hepatobiliary: Compared with the prior MRI and interval CTs, the liver has undergone a gradual but dramatic change. There is progressive lobularity of the hepatic contours with relative enlargement of the left and caudate lobes consistent with cirrhosis. The previously referenced macroscopic hepatic metastatic disease demonstrates continued slight improvement. For example, adjacent lesions anteriorly in the right hepatic lobe measure 2.0 x 1.4 cm and 1.5 x 1.3 cm on images 18 and 19 of series 1103. This compares with 2.1 x 1.6 cm and 1.7 x 1.4 cm on the most recent CT. A lesion superiorly in the right hepatic lobe measures 1.4 cm on image 10 (previously 1.7 cm). These lesions demonstrate central necrosis and peripheral enhancement. There is a background of diffuse hepatic nodularity which demonstrates confluent T2 signal, diffuse enhancement following contrast and diffuse restricted diffusion. Gallstones again noted. There is no gallbladder wall thickening or biliary dilatation.   Pancreas: Unremarkable. No pancreatic ductal dilatation or surrounding inflammatory changes.  Spleen: Normal in size without focal abnormality.  Adrenals/Urinary Tract: Both adrenal glands appear normal.Small left renal cysts are noted. The right kidney appears normal. There is no hydronephrosis.  Stomach/Bowel: Mild diffuse bowel wall thickening, likely related to the liver disease. No significant bowel distention or focal lesion identified. The transverse colon is collapsed.  Vascular/Lymphatic: There are no enlarged abdominal lymph nodes. No significant arterial abnormalities identified. There is extrinsic mass effect on the intrahepatic portion of  the IVC and the hepatic veins. The portal vein is patent. No large vessel occlusion identified.  Other: Interval development of moderate ascites. There is thin peritoneal enhancement without demonstrated discrete peritoneal or omental nodularity.  Musculoskeletal: No acute or significant osseous findings.  IMPRESSION: 1. The previously referenced partially treated macroscopic hepatic metastatic disease demonstrates continued improvement. 2. However, there is rapidly progressive extensive background hepatic abnormality with diffuse contour irregularity and confluent nodularity consistent with macronodular cirrhosis or pseudocirrhosis. The individual nodules may represent regenerating nodules, and no dominant lesion is identified to suggest hepatocellular carcinoma. Given this background abnormality, recurrent metastatic disease is difficult to completely exclude. If that is a clinical concern, random liver biopsy should be considered. Continued follow-up with CT or MRI also recommended. 3. Interval development of moderate ascites without definite peritoneal/omental recurrence.   Electronically Signed   By: Richardean Sale M.D.   On: 12/20/2014 10:53   US Paracentesis  12/27/2014   INDICATION: Stage IV breast cancer, imaging findings suggestive of cirrhosis, ascites.  Request is made for diagnostic and therapeutic paracentesis.  EXAM: ULTRASOUND-GUIDED DIAGNOSTIC AND THERAPEUTIC PARACENTESIS  COMPARISON:  None.  MEDICATIONS: None.  COMPLICATIONS: None immediate  TECHNIQUE: Informed written consent was obtained from the patient after a discussion of the risks, benefits and alternatives to treatment. A timeout was performed prior to the initiation of the procedure.  Initial ultrasound scanning demonstrates a small to moderate amount of ascites within the left lower abdominal quadrant. The left lower abdomen was prepped and draped in the usual sterile fashion. 1% lidocaine was used for local anesthesia. Under direct ultrasound guidance, a 19 gauge, 10-cm, Yueh catheter was introduced. An ultrasound image was saved for documentation purposed. The paracentesis was performed. The catheter was removed and a dressing was applied. The patient tolerated the procedure well without immediate post procedural complication.  FINDINGS: A total of approximately 2.2 liters of slightly turbid, yellow fluid was removed. Samples were sent to the laboratory as requested by the clinical team.  IMPRESSION: Successful ultrasound-guided diagnostic and therapeutic paracentesis yielding 2.2 liters of peritoneal fluid.  Read by: Rowe Robert, PA-C   Electronically Signed   By: Marybelle Killings M.D.   On: 12/27/2014 15:51   Ir US Guide Vasc Access Left  12/29/2014   CLINICAL DATA:  49 year old female with a history of metastatic breast carcinoma.  She has had rapid deterioration of her liver, with imaging differential diagnosis of cirrhosis or potentially diffuse breast metastases. She has been referred for liver biopsy, and given her malignant ascites, the biopsy will be done trans jugular.  EXAM: ULTRASOUND GUIDANCE FOR VASCULAR ACCESS  FLUOROSCOPIC GUIDANCE FOR TRANSJUGULAR LIVER BIOPSY.  Date:  2/26/20162/26/2016 7:04 pm  FLUOROSCOPY TIME:  7 MINUTES, 36 SECONDS 169 mGy  MEDICATIONS AND MEDICAL HISTORY:  3.0 mg Versed, 150 mcg fentanyl  ANESTHESIA/SEDATION: 45 minutes  CONTRAST:  74m OMNIPAQUE IOHEXOL 300 MG/ML  SOLN  COMPLICATIONS: None  PROCEDURE: Informed consent was obtained from the patient following explanation of the procedure, risks, benefits and alternatives. The patient understands, agrees and consents for the procedure. All questions were addressed. A time out was performed.  Maximal barrier sterile technique utilized including caps, mask, sterile gowns, sterile gloves, large sterile drape, hand hygiene, and betadine prep.  The patient's right neck was prepped and draped in the usual sterile fashion. Ultrasound survey demonstrated a patent right internal jugular vein above the level of the entry of a indwelling right port catheter into the IJ vein. Once access was  achieved with ultrasound guidance, the vein was found to be occluded as there was inability to pass the wire.  The patient's left neck was then prepped and draped in usual sterile fashion. Skin and subcutaneous tissues were infiltrated 1% lidocaine. Ultrasound guidance was used access the left internal jugular vein. 018 micro wire was passed into the right heart under fluoroscopy.  Incision was made on the needle on the needle was removed over the wire. Micropuncture set was then placed over the micro wire. The inner dilator and cannula were removed along with the wire, and an 035 Bentson wire was placed into the right heart. A 9 French short sheath was then placed over the wire. Through the short sheath, the Bentson wire and a Kumpe E were used to navigate into the IVC. An 93 Bentson wire was then placed into the IVC. Dilation of the tissue tract with an 74 French dilator was performed after removing the short sheath, and then a 11 [place] TIPS sheath was placed into the right atrium. A Kumpe the catheter and Bentson wire were then navigated into the right hepatic vein. Venogram was performed.  A stiff Amplatz wire was then advanced into the  right hepatic vein. The Kumpe the catheter was removed, and then the guide sheath for the transjugular biopsied sat was advanced into the right hepatic vein. We confirmed that the catheter was lodged against the side wall with a small amount of contrast infusion, and then several biopsies were retrieved. Three samples were placed into formalin and 3 samples were placed into saline.  Once we had retrieved adequate tissue, all wires catheters and sheaths were we removed.  Manual compression was used for hemostasis.  The patient tolerated the procedure well and remained hemodynamically stable throughout.  No complications were encountered and no significant blood loss was encountered.  Marland Kitchen  FINDINGS: The right internal jugular vein is patent above the entry of right chest wall port catheter, below this and at the site of the catheter entry the vein is occluded.  Patent left IJ vein.  Confirmation of catheter tip within the right hepatic vein before biopsy retrieved.  At least 3 tissue biopsy were sent in both formalin and saline to the pathology lab.  IMPRESSION: Status post transjugular liver biopsy, with tissue specimen sent to pathology for complete histopathologic analysis.  Signed,  Dulcy Fanny. Earleen Newport, DO  [facility]  [facility]   Electronically Signed   By: Corrie Mckusick D.O.   On: 12/29/2014 19:19    Assessment: 49 y.o. [place] woman with stage IV breast cancer (January 2014) involving bones, uterus and ovaries, liver, abdomen (carcinomatosis) and skin  (1) status post bilateral breast biopsies in August 2010. On the left, only atypical ductal hyperplasia. On the right upper outer quadrant, high-grade invasive ductal carcinoma, clincally T2 N0, stage IIA  (2) Treated in the neoadjuvant setting with docetaxel, doxorubicin, and cyclophosphamide x6, chemotherapy completed in December 2010.   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 However three out of 10 lymph nodes were positive. ypTis ypN1, stage IIB. Tumor was strongly estrogen receptor/progesterone receptor positive, HER2/neu negative with a high proliferation fraction.   (4) adjuvant radiation therapy, completed May 2011,   (5) on tamoxifen May 2011 to January 2014 when she was found to have stage IV disease  (6) s/p TAH-BSO 11/25/2012 with  metastatic brast cancer, estrogen receptor 30% and progestrerone receptor 20% positive, HER-2 negative  (7) anastrozole started February 2014, discontinued in April 2014 due to poor tolerance  (8) patient started letrozole in mid May 2014, discontinued August 2014 with progression  (9) zoledronic acid given initially every 28 days for bony metastatic disease, first dose in May 2014; changed to every 6 weeks beginning 02/28/2014, now given every 12 weeks-- most recent dose 10/04/2014  (10) skin involvement over the left breast and possibly other distant skin sites noted June 2014, with  (a) biopsy of the left breast and a left axillary node 04/29/2013 confirming 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  (11) status post bilateral mastectomies 05/25/2013, showing: (a) on the left, mypT1c NX invasive mammary carcinoma, with ductal and lobular features, grade 1, repeat HER-2 again negative (b) on the right, yp T2 NX invasive lobular breast cancer, grade 1, with negative margins, and HER-2 again  negative  (12) right chest wall skin biopsy and right neck biopsy both positive for metastatic breast cancer  (13) fulvestrant at 500 mg monthly started 06/10/2013, last dose 01/17/2014, with progression  (14) Hot flashes, improving on clonidine at bedtime  (15) Depression, started oral antidepressants, not taking.  (16) Skin lesion, right arm  (17) thyroid nodule - biopsy 02/28/2014 benign  (18) Abraxane, first dose 02/21/2014, to be given day 1 and day 8 of every 21 day cycle, with restaging after 4 cycles showing stable/improved disease; carboplatin added to day one beginning with cycle 5  (19) Patient has progressed on Carbo/Abraxane and started Eribulin Mesylate on 07/04/14, stopped after one cycle because of worsening neuropathy symptoms  (20) exemestane and everolimus started 07/28/2014; everolimus dose dropped to 80m starting 09/04/2014 and stopped 12/15/2014 with elevated LFTs  (21) dysphagia - swallowing study may 2015 showed esophageal narrowing, no mass; S/P esophageal dilatation with improvement.   (22) apparent cirrhosis on scans with biopsy 12/29/2014 results pending    Plan: I told CCathlinand her family if she is discharged to home I will see her in the office this Thursday 01/18/2015 at 4 PM but if she is still in the hospital this evening I will come by around 6:45-7:00 PM to discuss results of the biopsy with her  If the biopsy shows cancer, as we expect, I will recommend a palliative/Hospice approach. In that case it may be helpful to place a palliative care consult to help with discharge planning.  If you need to reach me today my beeper is [phone]   MChauncey Cruel MD 01/01/2015  7:26 AM Medical On[facility] [address]Tel. [phone]   Fax. [phone]

## 2015-01-01 NOTE — Progress Notes (Signed)
Daily Rounding Note  01/01/2015, 9:16 AM  LOS: 7 days   SUBJECTIVE:       Remains confused.  Having BMs, no nausea.  Pain is better.  Not clear how much she is eating or drinking.   OBJECTIVE:         Vital signs in last 24 hours:    Temp:  [97.4 F (36.3 C)-99.4 F (37.4 C)] 99.3 F (37.4 C) (02/29 0534) Pulse Rate:  [104-116] 104 (02/29 0534) Resp:  [20] 20 (02/29 0534) BP: (90-97)/(58-65) 90/58 mmHg (02/29 0534) SpO2:  [94 %-99 %] 94 % (02/29 0534) Last BM Date: 12/31/14 Filed Weights   12/24/14 1858 12/25/14 0117  Weight: 146 lb (66.225 kg) 147 lb 4.3 oz (66.8 kg)   General: lethargic, comfortable   Heart: RRR Chest: clear bil.  Abdomen: soft, mildly tender in upper abdomen.  Active BS.  distended  Extremities: no CCE Neuro/Psych:  Not oriented to date, year.  Knows place and her birthdate and recognizes family.  No asterixis.   Intake/Output from previous day: 02/28 0701 - 02/29 0700 In: 680 [P.O.:180; IV Piggyback:500] Out: -   Intake/Output this shift:    Lab Results:  Recent Labs  12/30/14 0520 12/31/14 0510  WBC 7.0 7.0  HGB 10.0* 9.8*  HCT 31.3* 30.2*  PLT 118* 125*   BMET  Recent Labs  12/30/14 0520 12/31/14 0510  NA 139 134*  K 4.4 4.3  CL 112 108  CO2 18* 16*  GLUCOSE 101* 101*  BUN 39* 43*  CREATININE 1.00 1.33*  CALCIUM 9.9 9.2   LFT  Recent Labs  12/30/14 0520 12/31/14 0510  PROT 5.5* 5.3*  ALBUMIN 2.4* 2.2*  AST 480* 521*  ALT 113* 121*  ALKPHOS 763* 762*  BILITOT 6.7* 7.4*   PT/INR  Recent Labs  12/31/14 0510  LABPROT 17.9*  INR 1.47   Hepatitis Panel No results for input(s): HEPBSAG, HCVAB, HEPAIGM, HEPBIGM in the last 72 hours.  Studies/Results: No results found.   Scheduled Meds: . glycopyrrolate  2 mg Oral BID  . lactulose  20 g Oral TID  . nicotine  7 mg Transdermal Daily  . pantoprazole  40 mg Oral Daily  . rifaximin  550 mg Oral BID  .  sodium chloride  3 mL Intravenous Q12H  . 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   *   New finding of cirrhosis on CT scan.  2/26 liver bx: path pending.  Steady rise of liver enzymes, alk phos, t bili. .    *  Coagulopathy.  No vitamin K or FFP thus far   *  Dysphagia, recurrentl.  Good response to dilation of esoph stricture in May 2015.  On Carafate and PPI.   *  Confusion, new in few days PTA.  Persists.  Elevated ammonia.  On Rifaximin. 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    *  Thrombocytopenia.     PLAN   *  Await liver biopsy, not sure the results will change decision as to when to discharge. .  Not sure she is ready to go home today given her need for more  intense assistance and observation at home.    *  Her son is open to pall care/hospice consult, regardless of results of liver biopsy.  Hospice may be able to help with some home care and pain mgt.     Azucena Freed  01/01/2015, 9:16 AM Pager: [phone]  GI Attending Note  I have personally taken an interval history, reviewed the chart, and examined the patient.  I agree with the extender's note, impression and recommendations.  Suspect infiltrative liver disease with liver decompensation.  Not much to offer since she appears to have malignant ascites  Sandy Salaam. Deatra Ina, MD, [facility] [phone]

## 2015-01-01 NOTE — Progress Notes (Signed)
[facility] PROGRESS NOTE  Tammy SHUTTLEWORTH ASN:[id number] DOB: Feb 19, 1966 DOA: 12/24/2014 PCP: Angelica Chessman, MD   HPI Patient with stage IV breast cancer metastasized aches prepped pelvis, bones and liver and carcinomatosis Patient is status post chemotherapy and lumpectomy. Patient is currently on oral chemotherapy. For the past 2 weeks she have been having worsening abdominal pain and generalized pain.  Subjective: - progressively lethargic today  Assessment/Plan:  Goals of care - I had extensive discussion with the patient over the weekend and I discussed with the patient's family today. She does not seem to progress well and she has worsening of her liver function tests, worsening of her bilirubin, worsening of her renal function, her MELD score has significantly increased and she is more encephalopathic today. Patient expressed throughout the weekend that she "does not want to spend the last days of her life in the hospital". Family very understanding that she has advanced metastatic cancer as well as what looks like end-stage liver disease. The biopsy of the liver that was done last Friday is pending, however I discussed with gastroenterology and hematology there is not much that can be reversed. Patient's mother and sister understand that she is having an extremely poor prognosis and may have hours to days left given the significant decline over the last couple of days. They tell me that the patient has fought really hard over the last years with her cancer, and on several occasions in the last week she told the family members that she is "ready to go". After discussing with the family and per their wishes, she is now DO NOT RESUSCITATE and we will look into hospice referral, either at home or residential. More family members will meet to decide what is the best next step. I have also discussed with Dr. [name], her primary oncologist.  Elevated liver function tests / Liver  cirrhosis vs metastasis / End stage liver disease - CT shows findings suggestive of cirrhosis. Unclear etiology, biopsy pending - goals of care as above - MELD ~ 25  Ascites - in the setting of liver disfunction - no evidence of SBP, high SAAG  - cytology with "metastatic carcinoma consistent with breast" which confers poor prognosis AKI - likely in the setting of liver failure, hypotension - possible hepatorenal syndrome Hepatic encephalopathy - lactulose and Rifaximin Intractable nausea, vomiting, and abdominal pain - CT without evidence of obstruction.  Abdominal CT also showed cholelithiasis, doubt cholecystitis. - CT of abdomen and pelvis also showed moderate volume ascites, obtained diagnostic paracentesis 2/24 - resolved Stage IV breast cancer with widespread metastatic disease Possible esophagitis  - Supportive management, patient started on IV Protonix. Chronic pain syndrome/cancer associated pain  - IV pain medications as needed.   Acute encephalopathy   Code Status: Full code. Family Communication: family bedside Disposition Plan: Pending  Consultants:  Oncology - Dr. [name]  GI  IR  Procedures:  Head CT - 12/25/2014  CT abdomen and pelvis with contrast - 12/24/2014  Antibiotics:  None  Objective: Filed Vitals:   12/31/14 1453 12/31/14 2026 12/31/14 2238 01/01/15 0534  BP: 97/61 93/65  90/58  Pulse: 112 116  104  Temp: 97.4 F (36.3 C) 99.4 F (37.4 C) 98.4 F (36.9 C) 99.3 F (37.4 C)  TempSrc: Oral Oral Oral Oral  Resp: 20 20  20   Height:      Weight:      SpO2: 99% 98%  94%    Intake/Output Summary (Last 24 hours) at 01/01/15  [place] filed at 12/31/14 1454  Gross per 24 hour  Intake    120 ml  Output      0 ml  Net    120 ml   Filed Weights   12/24/14 1858 12/25/14 0117  Weight: 66.225 kg (146 lb) 66.8 kg (147 lb 4.3 oz)   Exam: General: lethargic HEENT: +scleral icterus Neck: Supple CV: S1 and S2 Lungs: Clear to  ascultation bilaterally Abdomen: Soft, generalized tenderness, mild distention, +bowel sounds. Ext: Good pulses. Trace edema.  Data Reviewed: Basic Metabolic Panel:  Recent Labs Lab 12/28/14 0341 12/29/14 0515 12/30/14 0520 12/31/14 0510 01/01/15 0913  NA 138 139 139 134* 136  K 3.9 4.0 4.4 4.3 4.7  CL 114* 110 112 108 110  CO2 19 18* 18* 16* 16*  GLUCOSE 96 105* 101* 101* 103*  BUN 32* 32* 39* 43* 58*  CREATININE 0.91 0.89 1.00 1.33* 1.66*  CALCIUM 9.2 9.7 9.9 9.2 9.5  MG  --   --   --  2.3  --   PHOS  --   --   --  4.6  --    Liver Function Tests:  Recent Labs Lab 12/28/14 0341 12/29/14 0515 12/30/14 0520 12/31/14 0510 01/01/15 0913  AST 502* 456* 480* 521* 998*  ALT 122* 114* 113* 121* 203*  ALKPHOS 758* 711* 763* 762* 844*  BILITOT 4.8* 5.6* 6.7* 7.4* 9.2*  PROT 5.6* 5.6* 5.5* 5.3* 5.4*  ALBUMIN 2.4* 2.4* 2.4* 2.2* 2.1*    Recent Labs Lab 12/28/14 0345 12/30/14 0520 12/31/14 0510  AMMONIA 108* 71* 98*   CBC:  Recent Labs Lab 12/28/14 0341 12/29/14 0515 12/30/14 0520 12/31/14 0510 01/01/15 0913  WBC 5.4 6.1 7.0 7.0 10.2  HGB 9.7* 10.0* 10.0* 9.8* 10.2*  HCT 30.8* 31.3* 31.3* 30.2* 31.1*  MCV 78.6 78.1 77.1* 77.4* 76.6*  PLT 145* 145* 118* 125* 89*    Recent Results (from the past 240 hour(s))  Urine culture     Status: None   Collection Time: 12/25/14  1:53 AM  Result Value Ref Range Status   Specimen Description URINE, CLEAN CATCH  Final   Special Requests NONE  Final   Colony Count   Final    >=100,000 COLONIES/ML Performed at [facility]    Culture   Final    Multiple bacterial morphotypes present, none predominant. Suggest appropriate recollection if clinically indicated. Performed at [facility]    Report Status 12/26/2014 FINAL  Final  Clostridium Difficile by PCR     Status: None   Collection Time: 12/27/14  1:28 PM  Result Value Ref Range Status   C difficile by pcr NEGATIVE NEGATIVE Final    Comment:  Performed at [facility]  Body fluid culture     Status: None   Collection Time: 12/27/14  3:42 PM  Result Value Ref Range Status   Specimen Description ASCITIC  Final   Special Requests NONE  Final   Gram Stain   Final    RARE WBC PRESENT, PREDOMINANTLY MONONUCLEAR NO ORGANISMS SEEN Performed at [facility]    Culture   Final    NO GROWTH 3 DAYS Performed at [facility]    Report Status 12/31/2014 FINAL  Final     Studies: No results found.  Scheduled Meds: . glycopyrrolate  2 mg Oral BID  . lactulose  20 g Oral TID  . nicotine  7 mg Transdermal Daily  . pantoprazole  40  mg Oral Daily  . rifaximin  550 mg Oral BID  . sodium chloride  3 mL Intravenous Q12H  . sucralfate  1 g Oral TID WC & HS   Continuous Infusions:    Active Problems:   Breast carcinoma metastatic to pelvis   Essential hypertension   Breast cancer metastasized to multiple sites   Liver metastases   Intractable nausea and vomiting   Cancer associated pain   Cancer related pain   Arterial hypotension   Hepatic cirrhosis   Elevated LFTs   Ascites   Encephalopathy, hepatic  Time spent: 60 minutes, more than 50% in discussing goals of care / family discussions  Marzetta Board, MD  [facility] Pager [phone]. If 7PM-7AM, please contact night-coverage at [email or web address], password [facility] 01/01/2015, 12:18 PM  LOS: 7 days

## 2015-01-01 NOTE — Progress Notes (Signed)
CARE MANAGEMENT NOTE 01/01/2015  Patient:  LERIN, JECH   Account Number:  [id number]  Date Initiated:  12/26/2014  Documentation initiated by:  Karl Bales  Subjective/Objective Assessment:   Pt admitted with cco N,V,D     Action/Plan:   from home   Anticipated DC Date:  12/27/2014   Anticipated DC Plan:  [place]  CM consult      Choice offered to / List presented to:  C-1 Patient   DME arranged  [facility] Rile      DME agency  [place] arranged  HH-1 RN      [place].   Status of service:  Completed, signed off Medicare Important Message given?   (If response is "NO", the following Medicare IM given date fields will be blank) Date Medicare IM given:   Medicare IM given by:   Date Additional Medicare IM given:   Additional Medicare IM given by:    Discharge Disposition:    Per UR Regulation:  Reviewed for med. necessity/level of care/duration of stay  If discussed at [place] of Stay Meetings, dates discussed:   12/28/2014  01/01/2015    Comments:  01/01/15 MMcGibboney, RN, BSN MD spoke with family x2 for discharge plans.1230  This CM spoke to pt's Mother who referred me to pt's son and daugther on the way up to the hospital. 1300  Spoke with pt who became tearful, who asked me to talk with her Spoke with her son Patsy [place] and daughter at bedside concerning discharge plans. Son Patsy [place] asked for time to talk with his other brothers concerning discharge plans. [place].  Referral given to [place]. 1700 A call that there are no beds at any [facility]. Called [place] to explain. He selected [facility] for home. Left VM for in house [facility] Rep related to office now being closed for referrals.  12/30/2014 1030 NCM spoke to pt and she does have RW with seat in the room. HH arranged with [facility]. Jonnie Finner RN CCM Case Mgmt  phone [phone]  12/29/14 MMcGibboney, RN, BSN Spoke with pt concerning Home Health. She and family selected [facility] referral given to in house rep.  12/26/14 MMcGibboney, RN, BSN Chart reviewed.

## 2015-01-01 NOTE — Telephone Encounter (Signed)
S/w pt confirming MD visit per 02/28 POF...Marland KitchenMarland KitchenMarland Kitchen KJ

## 2015-01-01 NOTE — Progress Notes (Signed)
CSW received consult from [place] that patient's family has requested [facility]. CSW spoke with Erling Conte, [place] who informed CSW that they have no availability. CSW called Dossie Der, [place] facilities who said they were all full. CSW spoke with CSW Director, [facility] - patient will need to d/c home with hospice. RNCM, Cookie aware.      Raynaldo Opitz, [facility] Clinical Social Worker cell #: [phone]

## 2015-01-01 NOTE — Progress Notes (Signed)
Patient ID: Tammy Blanchard, female   DOB: 05-Feb-1966, 49 y.o.   MRN: [id number]   Oncology note reviewed. Liver bx  Result still pending Dr. [name] plans  Hospice consult/palliative care if bx shows cancer which is expected. Will follow up path.

## 2015-01-02 ENCOUNTER — Telehealth: Payer: Self-pay | Admitting: *Deleted

## 2015-01-02 LAB — ALPHA-1 ANTITRYPSIN PHENOTYPE: A-1 Antitrypsin: 287 mg/dL — ABNORMAL HIGH (ref 83–199)

## 2015-01-02 LAB — HEMOCHROMATOSIS DNA-PCR(C282Y,H63D)

## 2015-01-02 MED ORDER — OXYCODONE HCL 5 MG PO TABS: 5.0000 mg | ORAL_TABLET | Freq: Four times a day (QID) | ORAL | Status: AC | PRN

## 2015-01-02 MED ORDER — LACTULOSE 10 GM/15ML PO SOLN: 20.0000 g | Freq: Three times a day (TID) | ORAL | Status: AC

## 2015-01-02 MED ORDER — GI COCKTAIL ~~LOC~~: 30.0000 mL | Freq: Two times a day (BID) | ORAL | Status: AC | PRN

## 2015-01-02 NOTE — Discharge Summary (Signed)
Physician Discharge Summary  Tammy Blanchard GQQ:[id number] DOB: 1966/02/24 DOA: 12/24/2014  PCP: Angelica Chessman, MD  Admit date: 12/24/2014 Discharge date: 01/02/2015  Time spent: 35 minutes  Recommendations for Outpatient Follow-up:  1. 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 female   has a past medical history of Hypertension; Allergy; GERD (gastroesophageal reflux disease); Thyroid disease; Anemia; Hyperlipidemia; History of blood transfusion (2009); and Breast cancer (10/2009, 2014). Patient with stage IV breast cancer metastasized aches prepped pelvis, bones and liver and carcinomatosis. Patient is status post chemotherapy and lumpectomy. Patient is currently on oral chemotherapy. For the past 2 weeks she have been having worsening abdominal pain and generalized pain. Patient presents to ER with abdominal distention. Family states that patient became more confused after arrival to ER after getting IV dilaudid and phenergan. Patient reports being hypotensive last week down to 60/30. Currently BP 98/73. Denies any fever or chills. She have had somewhat unstable gait. CT abdomen worrisome for esophagitis and evidence of cirrhosis and Moderate volume ascites. She denies any sores in her mouth. States it hurts to swallow food. Hospitalist was called for admission for Intractable Nausea vomiting and abdominal  pain  Hospital Course:  Goals of care - I had extensive discussion with the patient and I discussed with the patient's family at length. She does not seem to progress well and she has worsening of her liver function tests, worsening of her bilirubin, worsening of her renal function, her MELD score has significantly increased and she is progressively encephalopathic. Patient expressed throughout the weekend that she "does not want to spend the last days of her life in the hospital". Family very understanding that she has advanced metastatic cancer as well as what looks like end-stage liver disease. The biopsy of the liver that was done last Friday is pending, however I discussed with gastroenterology and hematology there is not much that can be reversed. Patient's mother and sister understand that she is having an extremely poor prognosis and may have hours to days left given the significant decline over the last couple of days. They tell me that the patient has fought really hard over the last years with her cancer, and on several occasions in the last week she told the family members that she is "ready to go". After discussing with the family and per their wishes, she is now DO NOT RESUSCITATE and will switch the treatment towards comfort.  Elevated liver function tests / Liver cirrhosis vs metastasis / End stage liver disease - CT shows findings suggestive of cirrhosis. Unclear etiology, biopsy still pending Ascites - in the setting of liver disfunction - no evidence of SBP, high SAAG  - cytology with "metastatic carcinoma consistent with breast" which confers poor prognosis - symptomatic treatment  AKI - likely in the setting of liver failure, hypotension - possible hepatorenal syndrome Hepatic  encephalopathy Intractable nausea, vomiting, and abdominal pain Stage IV breast cancer with widespread metastatic disease Possible esophagitis  Chronic pain syndrome/cancer associated pain  Acute  encephalopathy   Procedures:  Head CT - 12/25/2014  CT abdomen and pelvis with contrast - 12/24/2014  Liver biopsy  2/26  Consultations:  GI  IR  Oncology   Discharge Exam: Filed Vitals:   12/31/14 2238 01/01/15 0534 01/01/15 1400 01/02/15 0613  BP:  90/58 83/58 82/61   Pulse:  104 109 106  Temp: 98.4 F (36.9 C) 99.3 F (37.4 C) 99 F (37.2 C) 99 F (37.2 C)  TempSrc: Oral Oral Oral Oral  Resp:  20 20 20   Height:      Weight:      SpO2:  94% 96% 100%     General: NAD,lethargic Cardiovascular: RRR Respiratory: bibasilar crackles  Discharge Instructions     Medication List    STOP taking these medications        exemestane 25 MG tablet  Commonly known as:  AROMASIN      TAKE these medications        gi cocktail Susp suspension  Take 30 mLs by mouth 2 (two) times daily as needed for indigestion (painfull swallowing). Shake well.     lactulose 10 GM/15ML solution  Commonly known as:  CHRONULAC  Take 30 mLs (20 g total) by mouth 3 (three) times daily.     lidocaine-prilocaine cream  Commonly known as:  EMLA  Apply 1 application topically as needed. Apply over portacath  1 1/2 hours to 2 hours prior to procedures as needed.     oxyCODONE 5 MG immediate release tablet  Commonly known as:  Oxy IR/ROXICODONE  Take 1 tablet (5 mg total) by mouth every 6 (six) hours as needed for severe pain.     prochlorperazine 10 MG tablet  Commonly known as:  COMPAZINE  Take 10 mg by mouth every 6 (six) hours as needed for nausea or vomiting.     ZOMETA IV  Inject 4 mg into the vein every 6 (six) weeks.           Follow-up Information    Follow up with [facility].   Why:  Home Health RN   Contact information:   [address] [place] [address] [phone]       The results of significant diagnostics from this hospitalization (including imaging, microbiology, ancillary and laboratory) are listed below for reference.     Significant Diagnostic Studies: Ct Head Wo Contrast  12/25/2014   CLINICAL DATA:  Confusion. Previous CT abdomen and pelvis with IV contrast material at 2213 hr on 12/24/2014. History of breast cancer.  EXAM: CT HEAD WITHOUT CONTRAST  TECHNIQUE: Contiguous axial images were obtained from the base of the skull through the vertex without intravenous contrast.  COMPARISON:  02/16/2014  FINDINGS: Focal encephalomalacia in the right anterior periventricular white matter is unchanged since previous study and likely represents focal area of ischemia. No mass effect or midline shift. No abnormal extra-axial fluid collections. Gray-white matter junctions are distinct. Basal cisterns are not effaced. No evidence of acute intracranial hemorrhage. No depressed skull fractures. Diffuse heterogeneous appearance of the calvarium consistent with known metastases. Visualized paranasal sinuses and mastoid air cells are not opacified.  IMPRESSION: Unchanged appearance of chronic white matter encephalomalacia, likely ischemic. Diffuse bone metastases again demonstrated. No acute intracranial abnormalities.   Electronically Signed   By: Lucienne Capers M.D.   On: 12/25/2014  00:55   Ct Abdomen Pelvis W Contrast  12/24/2014   EXAM: CT ABDOMEN AND PELVIS WITH CONTRAST  TECHNIQUE: Multidetector CT imaging of the abdomen and pelvis was performed using the standard protocol following bolus administration of intravenous contrast.  CONTRAST:  145mL OMNIPAQUE IOHEXOL 300 MG/ML  SOLN  COMPARISON:  Prior MRI from 12/20/2014 as well as CT from 11/08/2014.  FINDINGS: Small bilateral pleural effusions with associated bibasilar atelectasis is present. No pericardial effusion.  Previously identified macroscopic hepatic metastases are grossly stable relative to recent MRI. For reference purposes, the largest lesion in the right hepatic lobe measures 2.9 x 2.1 cm. Again, the liver demonstrates a diffusely heterogeneous appearance with nodular  contour, suggestive of cirrhosis or possibly pseudo cirrhosis. These changes are grossly similar relative to recent MRI.  Cholelithiasis again noted within the gallbladder lumen. No biliary dilatation. Spleen within normal limits. Adrenal glands and pancreas are normal.  1.7 cm cyst present within the left kidney. No nephrolithiasis, hydronephrosis, or focal enhancing renal mass.  Mild circumferential wall thickening noted within the partially visualized distal esophagus, most commonly related to reflex. Stomach otherwise unremarkable. No evidence for bowel obstruction. No definite acute inflammatory changes seen about the bowels.  Mild circumferential bladder wall thickening. 7 mm hyperdensity at the posterior aspect of the bladder lumen is stable from prior study.  Uterus and ovaries are not visualized.  Moderate volume ascites again seen within the abdomen and pelvis. No frank peritoneal implant or omental caking identified.  No free air.  No pathologically enlarged intra-abdominal pelvic lymph nodes identified.  Widespread diffuse osseous metastases again seen. No pathologic fracture.  IMPRESSION: 1. Stable appearance of macroscopic intrahepatic metastases as compared to recent MRI from 12/20/2014. 2. Diffusely heterogeneous and nodular contour of the liver, again suggestive of cirrhosis or possibly pseudo cirrhosis. These findings are stable relative to recent MRI. 3. Moderate volume ascites within the abdomen and pelvis. 4. Small bilateral pleural effusions with associated bibasilar atelectasis. 5. Diffuse esophageal wall thickening within the distal esophagus. Finding may be related to post radiation changes or possibly reflux. 6. Widespread diffuse osseous metastatic disease without pathologic fracture. 7. Cholelithiasis.   Electronically Signed   By: Jeannine Boga M.D.   On: 12/24/2014 22:53   Ir Transcatheter Bx  12/29/2014   CLINICAL DATA:  49 year old female with a history of metastatic breast  carcinoma.  She has had rapid deterioration of her liver, with imaging differential diagnosis of cirrhosis or potentially diffuse breast metastases. She has been referred for liver biopsy, and given her malignant ascites, the biopsy will be done trans jugular.  EXAM: ULTRASOUND GUIDANCE FOR VASCULAR ACCESS  FLUOROSCOPIC GUIDANCE FOR TRANSJUGULAR LIVER BIOPSY.  Date:  2/26/20162/26/2016 7:04 pm  FLUOROSCOPY TIME:  7 MINUTES, 36 SECONDS 169 mGy  MEDICATIONS AND MEDICAL HISTORY: 3.0 mg Versed, 150 mcg fentanyl  ANESTHESIA/SEDATION: 45 minutes  CONTRAST:  108mL OMNIPAQUE IOHEXOL 300 MG/ML  SOLN  COMPLICATIONS: None  PROCEDURE: Informed consent was obtained from the patient following explanation of the procedure, risks, benefits and alternatives. The patient understands, agrees and consents for the procedure. All questions were addressed. A time out was performed.  Maximal barrier sterile technique utilized including caps, mask, sterile gowns, sterile gloves, large sterile drape, hand hygiene, and betadine prep.  The patient's right neck was prepped and draped in the usual sterile fashion. Ultrasound survey demonstrated a patent right internal jugular vein above the level of the entry of a indwelling right port catheter into the IJ vein.  Once access was achieved with ultrasound guidance, the vein was found to be occluded as there was inability to pass the wire.  The patient's left neck was then prepped and draped in usual sterile fashion. Skin and subcutaneous tissues were infiltrated 1% lidocaine. Ultrasound guidance was used access the left internal jugular vein. 018 micro wire was passed into the right heart under fluoroscopy.  Incision was made on the needle on the needle was removed over the wire. Micropuncture set was then placed over the micro wire. The inner dilator and cannula were removed along with the wire, and an 035 Bentson wire was placed into the right heart. A 9 French short sheath was then placed over  the wire. Through the short sheath, the Bentson wire and a Kumpe E were used to navigate into the IVC. An 17 Bentson wire was then placed into the IVC. Dilation of the tissue tract with an 21 French dilator was performed after removing the short sheath, and then a 11 [place] TIPS sheath was placed into the right atrium. A Kumpe the catheter and Bentson wire were then navigated into the right hepatic vein. Venogram was performed.  A stiff Amplatz wire was then advanced into the right hepatic vein. The Kumpe the catheter was removed, and then the guide sheath for the transjugular biopsied sat was advanced into the right hepatic vein. We confirmed that the catheter was lodged against the side wall with a small amount of contrast infusion, and then several biopsies were retrieved. Three samples were placed into formalin and 3 samples were placed into saline.  Once we had retrieved adequate tissue, all wires catheters and sheaths were we removed.  Manual compression was used for hemostasis.  The patient tolerated the procedure well and remained hemodynamically stable throughout.  No complications were encountered and no significant blood loss was encountered.  Marland Kitchen  FINDINGS: The right internal jugular vein is patent above the entry of right chest wall port catheter, below this and at the site of the catheter entry the vein is occluded.  Patent left IJ vein.  Confirmation of catheter tip within the right hepatic vein before biopsy retrieved.  At least 3 tissue biopsy were sent in both formalin and saline to the pathology lab.  IMPRESSION: Status post transjugular liver biopsy, with tissue specimen sent to pathology for complete histopathologic analysis.  Signed,  Dulcy Fanny. Earleen Newport, DO  [facility]  [facility]   Electronically Signed   By: Corrie Mckusick D.O.   On: 12/29/2014 19:19   Mr Liver W Wo Contrast  12/20/2014   CLINICAL DATA:  Follow up liver lesions. History of breast  cancer. Subsequent encounter.  EXAM: MRI ABDOMEN WITHOUT AND WITH CONTRAST  TECHNIQUE: Multiplanar multisequence MR imaging of the abdomen was performed both before and after the administration of intravenous contrast.  CONTRAST:  60mL MULTIHANCE GADOBENATE DIMEGLUMINE 529 MG/ML IV SOLN  COMPARISON:  CTs 11/08/2014 and 06/28/2014. MRI 02/07/2014. PET-CT 09/19/2013.  FINDINGS: Lower chest: Trace bilateral pleural effusions. Otherwise unremarkable.  Hepatobiliary: Compared with the prior MRI and interval CTs, the liver has undergone a gradual but dramatic change. There is progressive lobularity of the hepatic contours with relative enlargement of the left and caudate lobes consistent with cirrhosis. The previously referenced macroscopic hepatic metastatic disease demonstrates continued slight improvement. For example, adjacent lesions anteriorly in the right hepatic lobe measure 2.0 x 1.4 cm and 1.5 x 1.3 cm on images 18 and 19 of series 1103. This  compares with 2.1 x 1.6 cm and 1.7 x 1.4 cm on the most recent CT. A lesion superiorly in the right hepatic lobe measures 1.4 cm on image 10 (previously 1.7 cm). These lesions demonstrate central necrosis and peripheral enhancement. There is a background of diffuse hepatic nodularity which demonstrates confluent T2 signal, diffuse enhancement following contrast and diffuse restricted diffusion. Gallstones again noted. There is no gallbladder wall thickening or biliary dilatation.  Pancreas: Unremarkable. No pancreatic ductal dilatation or surrounding inflammatory changes.  Spleen: Normal in size without focal abnormality.  Adrenals/Urinary Tract: Both adrenal glands appear normal.Small left renal cysts are noted. The right kidney appears normal. There is no hydronephrosis.  Stomach/Bowel: Mild diffuse bowel wall thickening, likely related to the liver disease. No significant bowel distention or focal lesion identified. The transverse colon is collapsed.  Vascular/Lymphatic:  There are no enlarged abdominal lymph nodes. No significant arterial abnormalities identified. There is extrinsic mass effect on the intrahepatic portion of the IVC and the hepatic veins. The portal vein is patent. No large vessel occlusion identified.  Other: Interval development of moderate ascites. There is thin peritoneal enhancement without demonstrated discrete peritoneal or omental nodularity.  Musculoskeletal: No acute or significant osseous findings.  IMPRESSION: 1. The previously referenced partially treated macroscopic hepatic metastatic disease demonstrates continued improvement. 2. However, there is rapidly progressive extensive background hepatic abnormality with diffuse contour irregularity and confluent nodularity consistent with macronodular cirrhosis or pseudocirrhosis. The individual nodules may represent regenerating nodules, and no dominant lesion is identified to suggest hepatocellular carcinoma. Given this background abnormality, recurrent metastatic disease is difficult to completely exclude. If that is a clinical concern, random liver biopsy should be considered. Continued follow-up with CT or MRI also recommended. 3. Interval development of moderate ascites without definite peritoneal/omental recurrence.   Electronically Signed   By: Richardean Sale M.D.   On: 12/20/2014 10:53   US Paracentesis  12/27/2014   INDICATION: Stage IV breast cancer, imaging findings suggestive of cirrhosis, ascites. Request is made for diagnostic and therapeutic paracentesis.  EXAM: ULTRASOUND-GUIDED DIAGNOSTIC AND THERAPEUTIC PARACENTESIS  COMPARISON:  None.  MEDICATIONS: None.  COMPLICATIONS: None immediate  TECHNIQUE: Informed written consent was obtained from the patient after a discussion of the risks, benefits and alternatives to treatment. A timeout was performed prior to the initiation of the procedure.  Initial ultrasound scanning demonstrates a small to moderate amount of ascites within the left  lower abdominal quadrant. The left lower abdomen was prepped and draped in the usual sterile fashion. 1% lidocaine was used for local anesthesia. Under direct ultrasound guidance, a 19 gauge, 10-cm, Yueh catheter was introduced. An ultrasound image was saved for documentation purposed. The paracentesis was performed. The catheter was removed and a dressing was applied. The patient tolerated the procedure well without immediate post procedural complication.  FINDINGS: A total of approximately 2.2 liters of slightly turbid, yellow fluid was removed. Samples were sent to the laboratory as requested by the clinical team.  IMPRESSION: Successful ultrasound-guided diagnostic and therapeutic paracentesis yielding 2.2 liters of peritoneal fluid.  Read by: Rowe Robert, PA-C   Electronically Signed   By: Marybelle Killings M.D.   On: 12/27/2014 15:51   Ir US Guide Vasc Access Left  12/29/2014   CLINICAL DATA:  49 year old female with a history of metastatic breast carcinoma.  She has had rapid deterioration of her liver, with imaging differential diagnosis of cirrhosis or potentially diffuse breast metastases. She has been referred for liver biopsy, and given her malignant  ascites, the biopsy will be done trans jugular.  EXAM: ULTRASOUND GUIDANCE FOR VASCULAR ACCESS  FLUOROSCOPIC GUIDANCE FOR TRANSJUGULAR LIVER BIOPSY.  Date:  2/26/20162/26/2016 7:04 pm  FLUOROSCOPY TIME:  7 MINUTES, 36 SECONDS 169 mGy  MEDICATIONS AND MEDICAL HISTORY: 3.0 mg Versed, 150 mcg fentanyl  ANESTHESIA/SEDATION: 45 minutes  CONTRAST:  50mL OMNIPAQUE IOHEXOL 300 MG/ML  SOLN  COMPLICATIONS: None  PROCEDURE: Informed consent was obtained from the patient following explanation of the procedure, risks, benefits and alternatives. The patient understands, agrees and consents for the procedure. All questions were addressed. A time out was performed.  Maximal barrier sterile technique utilized including caps, mask, sterile gowns, sterile gloves, large sterile  drape, hand hygiene, and betadine prep.  The patient's right neck was prepped and draped in the usual sterile fashion. Ultrasound survey demonstrated a patent right internal jugular vein above the level of the entry of a indwelling right port catheter into the IJ vein. Once access was achieved with ultrasound guidance, the vein was found to be occluded as there was inability to pass the wire.  The patient's left neck was then prepped and draped in usual sterile fashion. Skin and subcutaneous tissues were infiltrated 1% lidocaine. Ultrasound guidance was used access the left internal jugular vein. 018 micro wire was passed into the right heart under fluoroscopy.  Incision was made on the needle on the needle was removed over the wire. Micropuncture set was then placed over the micro wire. The inner dilator and cannula were removed along with the wire, and an 035 Bentson wire was placed into the right heart. A 9 French short sheath was then placed over the wire. Through the short sheath, the Bentson wire and a Kumpe E were used to navigate into the IVC. An 77 Bentson wire was then placed into the IVC. Dilation of the tissue tract with an 92 French dilator was performed after removing the short sheath, and then a 11 [place] TIPS sheath was placed into the right atrium. A Kumpe the catheter and Bentson wire were then navigated into the right hepatic vein. Venogram was performed.  A stiff Amplatz wire was then advanced into the right hepatic vein. The Kumpe the catheter was removed, and then the guide sheath for the transjugular biopsied sat was advanced into the right hepatic vein. We confirmed that the catheter was lodged against the side wall with a small amount of contrast infusion, and then several biopsies were retrieved. Three samples were placed into formalin and 3 samples were placed into saline.  Once we had retrieved adequate tissue, all wires catheters and sheaths were we removed.  Manual compression was used  for hemostasis.  The patient tolerated the procedure well and remained hemodynamically stable throughout.  No complications were encountered and no significant blood loss was encountered.  Marland Kitchen  FINDINGS: The right internal jugular vein is patent above the entry of right chest wall port catheter, below this and at the site of the catheter entry the vein is occluded.  Patent left IJ vein.  Confirmation of catheter tip within the right hepatic vein before biopsy retrieved.  At least 3 tissue biopsy were sent in both formalin and saline to the pathology lab.  IMPRESSION: Status post transjugular liver biopsy, with tissue specimen sent to pathology for complete histopathologic analysis.  Signed,  Dulcy Fanny. Earleen Newport, DO  [facility]  [facility]   Electronically Signed   By: Corrie Mckusick D.O.   On: 12/29/2014 19:19  Microbiology: Recent Results (from the past 240 hour(s))  Urine culture     Status: None   Collection Time: 12/25/14  1:53 AM  Result Value Ref Range Status   Specimen Description URINE, CLEAN CATCH  Final   Special Requests NONE  Final   Colony Count   Final    >=100,000 COLONIES/ML Performed at [facility]    Culture   Final    Multiple bacterial morphotypes present, none predominant. Suggest appropriate recollection if clinically indicated. Performed at [facility]    Report Status 12/26/2014 FINAL  Final  Clostridium Difficile by PCR     Status: None   Collection Time: 12/27/14  1:28 PM  Result Value Ref Range Status   C difficile by pcr NEGATIVE NEGATIVE Final    Comment: Performed at [facility]  Body fluid culture     Status: None   Collection Time: 12/27/14  3:42 PM  Result Value Ref Range Status   Specimen Description ASCITIC  Final   Special Requests NONE  Final   Gram Stain   Final    RARE WBC PRESENT, PREDOMINANTLY MONONUCLEAR NO ORGANISMS SEEN Performed at [facility]    Culture    Final    NO GROWTH 3 DAYS Performed at [facility]    Report Status 12/31/2014 FINAL  Final     Labs: Basic Metabolic Panel:  Recent Labs Lab 12/27/14 0515 12/28/14 0341 12/29/14 0515 12/30/14 0520 12/31/14 0510 01/01/15 0913  NA 141 138 139 139 134* 136  K 4.0 3.9 4.0 4.4 4.3 4.7  CL 110 114* 110 112 108 110  CO2 19 19 18* 18* 16* 16*  GLUCOSE 100* 96 105* 101* 101* 103*  BUN 31* 32* 32* 39* 43* 58*  CREATININE 1.02 0.91 0.89 1.00 1.33* 1.66*  CALCIUM 9.6 9.2 9.7 9.9 9.2 9.5  MG  --   --   --   --  2.3  --   PHOS  --   --   --   --  4.6  --    Liver Function Tests:  Recent Labs Lab 12/28/14 0341 12/29/14 0515 12/30/14 0520 12/31/14 0510 01/01/15 0913  AST 502* 456* 480* 521* 998*  ALT 122* 114* 113* 121* 203*  ALKPHOS 758* 711* 763* 762* 844*  BILITOT 4.8* 5.6* 6.7* 7.4* 9.2*  PROT 5.6* 5.6* 5.5* 5.3* 5.4*  ALBUMIN 2.4* 2.4* 2.4* 2.2* 2.1*    Recent Labs Lab 12/28/14 0345 12/30/14 0520 12/31/14 0510  AMMONIA 108* 71* 98*   CBC:  Recent Labs Lab 12/28/14 0341 12/29/14 0515 12/30/14 0520 12/31/14 0510 01/01/15 0913  WBC 5.4 6.1 7.0 7.0 10.2  HGB 9.7* 10.0* 10.0* 9.8* 10.2*  HCT 30.8* 31.3* 31.3* 30.2* 31.1*  MCV 78.6 78.1 77.1* 77.4* 76.6*  PLT 145* 145* 118* 125* 89*    Signed:  Simya Tercero  [facility] 01/02/2015, 11:22 AM

## 2015-01-02 NOTE — Consult Note (Signed)
[facility] in conference room today with pt's DTR-Shimika Sturgeon [phone], son- Stayce Delancy [phone], and Collie Kittel [phone] per conference call.  Pt's other son Bradly Bienenstock was working and unavailable.  Other family present were pt's mother-Lethea Sims, stepfather- Inetta Fermo, and 2 sisters-Victoria Inoussa and [employer].  Began discussion with care at [place] at [facility] for residential Hospice per son's request.  However, as the meeting continued all family verbalized wish for pt to be able to return home and receive Hospice care in her home.  We discussed in great detail both the care in pt's home vs [facility].  Family feels they can pull together to ensure pt gets round-the-clock care in her home with Hospice support.  They are agreeable to [place] serving pt in the home.  They request the following DME:  Fully electric hospital bed with full side rails, gel overlay, OBT, 3 in 1 commode, lightwt. W/c, O2 concentrator for PRN use.  Will order DME per [facility].  Family reports they will need this evening to get furniture removed from her home to accommodate DME delivery.  Plan is for family to prepare the home this evening and DME will be delivered early tomorrow for d/c home tomorrow.  Thank you for the referral and opportunity to serve pt and family at end-of-life.  [facility] will f/u tomorrow to finalize d/c plans and transfer by non-emergent ambulance.  Quenton Fetter, RN [phone]

## 2015-01-02 NOTE — Progress Notes (Signed)
CSW received consult from Dr. Cruzita Lederer who spoke with patient's family who state that they do not feel comfortable with patient going home with hospice. Still no beds at [facility] per [facility]. CSW spoke with patient's son, Patsy Baltimore (ph#: [phone]) who is agreeable with [facility]. CSW made referral to Beverlee Nims at [facility] who will touch base with son.      Raynaldo Opitz, [facility] Clinical Social Worker cell #: [phone]

## 2015-01-02 NOTE — Telephone Encounter (Signed)
PT. WILL HAVE [facility] WITH THE HOSPICE PHYSICIAN TO DO SYMPTOM MANAGEMENT. DR.MAGRINAT WILL BE THE ATTENDING PHYSICIAN.

## 2015-01-03 ENCOUNTER — Other Ambulatory Visit: Payer: Self-pay | Admitting: Oncology

## 2015-01-03 MED ORDER — HEPARIN SOD (PORK) LOCK FLUSH 100 UNIT/ML IV SOLN
500.0000 [IU] | INTRAVENOUS | Status: AC | PRN
  Administered 2015-01-03: 500 [IU]

## 2015-01-03 NOTE — Progress Notes (Signed)
Pt seen and examined at bedside. D/C summary done by Dr. Renne Crigler, no changes needed. Pt to be d/c home with hospice. Family agreeable with the plan.   Faye Ramsay, MD  [facility] Pager [phone]  If 7PM-7AM, please contact night-coverage [email or web address] Password [email or web address]

## 2015-01-03 NOTE — Progress Notes (Signed)
Pt weaker and restless, unable to talk PO fluids, pt tuirned nad repositioned, has voided will cont to monitor. SRP, RN

## 2015-01-03 NOTE — Progress Notes (Signed)
md notified

## 2015-01-03 NOTE — Progress Notes (Signed)
Porta cath deaccessed. Flushed with 10cc NS followed by Heparin 5ml (100u/ml). No bleeding to site, band aid to site for comfort. Shraddha Lebron M 

## 2015-01-03 NOTE — Progress Notes (Signed)
Family (sister) stepped out to pick up another family member (mother), pt weak and more lethergic, open eyes to tactile touch, pt has not voided. Turned and repositioned, decreased respirations, shallow, will cont to monitor. BP low 70/42. MD notified SRP, RN

## 2015-01-03 NOTE — Progress Notes (Signed)
Pt LOC decreased, NO URINE OUTput--MD notified, BP low 61/41, report given to day RN. Family at the bedside and informed. SRP, RN

## 2015-01-03 NOTE — Progress Notes (Signed)
CSW consulted for transportation needs. Patient will need non-emergency ambulance transport home. CSW confirmed home address with patient's son, Patsy Baltimore. [facility] called for transport to pickup at 1:00pm.   No other CSW needs identified - CSW signing off.   Raynaldo Opitz, [facility] Clinical Social Worker cell #: [phone]

## 2015-01-04 ENCOUNTER — Telehealth: Payer: Self-pay | Admitting: *Deleted

## 2015-01-04 ENCOUNTER — Ambulatory Visit: Payer: Medicaid Other | Admitting: Oncology

## 2015-01-04 ENCOUNTER — Telehealth: Payer: Self-pay | Admitting: Oncology

## 2015-01-04 NOTE — Telephone Encounter (Signed)
appts cx per pof as pt is on hospice

## 2015-01-04 NOTE — Telephone Encounter (Signed)
Hospice called to say patient expired today. Dr [name] notified

## 2015-01-05 LAB — TOTAL BILIRUBIN, BODY FLUID: Total bilirubin, fluid: 1 mg/dL

## 2015-01-12 ENCOUNTER — Other Ambulatory Visit: Payer: Medicaid Other

## 2015-01-12 ENCOUNTER — Ambulatory Visit: Payer: Medicaid Other

## 2015-02-02 DEATH — deceased

## 2015-02-06 ENCOUNTER — Ambulatory Visit (HOSPITAL_COMMUNITY): Payer: Medicaid Other

## 2015-02-08 ENCOUNTER — Ambulatory Visit: Payer: Medicaid Other | Admitting: Oncology

## 2015-02-08 ENCOUNTER — Other Ambulatory Visit: Payer: Medicaid Other

## 2015-02-09 ENCOUNTER — Other Ambulatory Visit: Payer: Medicaid Other

## 2015-03-09 ENCOUNTER — Other Ambulatory Visit: Payer: Medicaid Other

## 2015-04-06 ENCOUNTER — Other Ambulatory Visit: Payer: Medicaid Other

## 2015-05-04 ENCOUNTER — Other Ambulatory Visit: Payer: Medicaid Other

## 2021-08-04 ENCOUNTER — Encounter: Payer: Self-pay | Admitting: Oncology

## 2021-09-20 ENCOUNTER — Other Ambulatory Visit: Payer: Self-pay | Admitting: Nurse Practitioner
